# Patient Record
Sex: Male | Born: 1935 | Race: White | Hispanic: No | Smoking: Never smoker
Health system: Southern US, Community
[De-identification: ages and names within clinical notes are randomized; demographics above are authoritative.]

## PROBLEM LIST (undated history)

## (undated) DIAGNOSIS — T451X5A Adverse effect of antineoplastic and immunosuppressive drugs, initial encounter: Secondary | ICD-10-CM

## (undated) DIAGNOSIS — Z960 Presence of urogenital implants: Secondary | ICD-10-CM

## (undated) DIAGNOSIS — C439 Malignant melanoma of skin, unspecified: Secondary | ICD-10-CM

## (undated) DIAGNOSIS — Z85820 Personal history of malignant melanoma of skin: Secondary | ICD-10-CM

## (undated) DIAGNOSIS — M545 Low back pain, unspecified: Secondary | ICD-10-CM

## (undated) DIAGNOSIS — I2510 Atherosclerotic heart disease of native coronary artery without angina pectoris: Secondary | ICD-10-CM

## (undated) DIAGNOSIS — C20 Malignant neoplasm of rectum: Secondary | ICD-10-CM

## (undated) DIAGNOSIS — Z872 Personal history of diseases of the skin and subcutaneous tissue: Secondary | ICD-10-CM

## (undated) DIAGNOSIS — Z8679 Personal history of other diseases of the circulatory system: Secondary | ICD-10-CM

## (undated) DIAGNOSIS — I480 Paroxysmal atrial fibrillation: Secondary | ICD-10-CM

## (undated) DIAGNOSIS — N211 Calculus in urethra: Secondary | ICD-10-CM

## (undated) DIAGNOSIS — Z9889 Other specified postprocedural states: Secondary | ICD-10-CM

## (undated) DIAGNOSIS — Z978 Presence of other specified devices: Secondary | ICD-10-CM

## (undated) DIAGNOSIS — N529 Male erectile dysfunction, unspecified: Secondary | ICD-10-CM

## (undated) DIAGNOSIS — M1990 Unspecified osteoarthritis, unspecified site: Secondary | ICD-10-CM

## (undated) DIAGNOSIS — I4510 Unspecified right bundle-branch block: Secondary | ICD-10-CM

## (undated) DIAGNOSIS — C4491 Basal cell carcinoma of skin, unspecified: Secondary | ICD-10-CM

## (undated) DIAGNOSIS — N400 Enlarged prostate without lower urinary tract symptoms: Secondary | ICD-10-CM

## (undated) DIAGNOSIS — D6481 Anemia due to antineoplastic chemotherapy: Secondary | ICD-10-CM

## (undated) DIAGNOSIS — Z808 Family history of malignant neoplasm of other organs or systems: Secondary | ICD-10-CM

## (undated) DIAGNOSIS — I10 Essential (primary) hypertension: Secondary | ICD-10-CM

## (undated) DIAGNOSIS — Z85828 Personal history of other malignant neoplasm of skin: Secondary | ICD-10-CM

## (undated) DIAGNOSIS — R600 Localized edema: Secondary | ICD-10-CM

## (undated) DIAGNOSIS — M4800 Spinal stenosis, site unspecified: Secondary | ICD-10-CM

## (undated) DIAGNOSIS — Z86010 Personal history of colonic polyps: Secondary | ICD-10-CM

## (undated) DIAGNOSIS — Z8669 Personal history of other diseases of the nervous system and sense organs: Secondary | ICD-10-CM

## (undated) DIAGNOSIS — Z8719 Personal history of other diseases of the digestive system: Secondary | ICD-10-CM

## (undated) DIAGNOSIS — E039 Hypothyroidism, unspecified: Secondary | ICD-10-CM

## (undated) DIAGNOSIS — Z860101 Personal history of adenomatous and serrated colon polyps: Secondary | ICD-10-CM

## (undated) DIAGNOSIS — K5900 Constipation, unspecified: Secondary | ICD-10-CM

## (undated) DIAGNOSIS — Z923 Personal history of irradiation: Secondary | ICD-10-CM

## (undated) DIAGNOSIS — G8929 Other chronic pain: Secondary | ICD-10-CM

## (undated) DIAGNOSIS — N210 Calculus in bladder: Secondary | ICD-10-CM

## (undated) DIAGNOSIS — I444 Left anterior fascicular block: Secondary | ICD-10-CM

## (undated) DIAGNOSIS — E7800 Pure hypercholesterolemia, unspecified: Secondary | ICD-10-CM

## (undated) HISTORY — PX: ROTATOR CUFF REPAIR: SHX139

## (undated) HISTORY — DX: Family history of malignant neoplasm of other organs or systems: Z80.8

## (undated) HISTORY — PX: MOHS SURGERY: SUR867

## (undated) HISTORY — DX: Basal cell carcinoma of skin, unspecified: C44.91

---
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
## 1963-09-08 HISTORY — PX: INGUINAL HERNIA REPAIR: SUR1180

## 1998-12-16 ENCOUNTER — Ambulatory Visit (HOSPITAL_COMMUNITY): Admission: RE | Admit: 1998-12-16 | Discharge: 1998-12-16 | Payer: Self-pay | Admitting: Gastroenterology

## 2000-07-18 ENCOUNTER — Ambulatory Visit (HOSPITAL_BASED_OUTPATIENT_CLINIC_OR_DEPARTMENT_OTHER): Admission: RE | Admit: 2000-07-18 | Discharge: 2000-07-18 | Payer: Self-pay | Admitting: Family Medicine

## 2001-08-11 ENCOUNTER — Ambulatory Visit (HOSPITAL_BASED_OUTPATIENT_CLINIC_OR_DEPARTMENT_OTHER): Admission: RE | Admit: 2001-08-11 | Discharge: 2001-08-11 | Payer: Self-pay | Admitting: Plastic Surgery

## 2002-09-29 ENCOUNTER — Ambulatory Visit (HOSPITAL_COMMUNITY): Admission: RE | Admit: 2002-09-29 | Discharge: 2002-09-29 | Payer: Self-pay | Admitting: Gastroenterology

## 2005-10-07 ENCOUNTER — Ambulatory Visit (HOSPITAL_COMMUNITY): Admission: RE | Admit: 2005-10-07 | Discharge: 2005-10-08 | Payer: Self-pay | Admitting: Orthopedic Surgery

## 2011-09-09 DIAGNOSIS — Z79899 Other long term (current) drug therapy: Secondary | ICD-10-CM | POA: Diagnosis not present

## 2011-09-09 DIAGNOSIS — E7800 Pure hypercholesterolemia, unspecified: Secondary | ICD-10-CM | POA: Diagnosis not present

## 2011-10-08 DIAGNOSIS — E039 Hypothyroidism, unspecified: Secondary | ICD-10-CM | POA: Diagnosis not present

## 2011-10-08 DIAGNOSIS — E7800 Pure hypercholesterolemia, unspecified: Secondary | ICD-10-CM | POA: Diagnosis not present

## 2011-10-08 DIAGNOSIS — N200 Calculus of kidney: Secondary | ICD-10-CM | POA: Diagnosis not present

## 2011-10-08 DIAGNOSIS — I10 Essential (primary) hypertension: Secondary | ICD-10-CM | POA: Diagnosis not present

## 2011-10-08 DIAGNOSIS — Z0000 Encounter for general adult medical examination without abnormal findings: Secondary | ICD-10-CM | POA: Diagnosis not present

## 2011-10-08 DIAGNOSIS — Z125 Encounter for screening for malignant neoplasm of prostate: Secondary | ICD-10-CM | POA: Diagnosis not present

## 2011-10-08 DIAGNOSIS — R7301 Impaired fasting glucose: Secondary | ICD-10-CM | POA: Diagnosis not present

## 2011-10-16 DIAGNOSIS — M25519 Pain in unspecified shoulder: Secondary | ICD-10-CM | POA: Diagnosis not present

## 2011-12-07 DIAGNOSIS — R351 Nocturia: Secondary | ICD-10-CM | POA: Diagnosis not present

## 2012-02-18 DIAGNOSIS — R7301 Impaired fasting glucose: Secondary | ICD-10-CM | POA: Diagnosis not present

## 2012-02-18 DIAGNOSIS — Z0000 Encounter for general adult medical examination without abnormal findings: Secondary | ICD-10-CM | POA: Diagnosis not present

## 2012-02-18 DIAGNOSIS — E7800 Pure hypercholesterolemia, unspecified: Secondary | ICD-10-CM | POA: Diagnosis not present

## 2012-02-18 DIAGNOSIS — E039 Hypothyroidism, unspecified: Secondary | ICD-10-CM | POA: Diagnosis not present

## 2012-02-18 DIAGNOSIS — I10 Essential (primary) hypertension: Secondary | ICD-10-CM | POA: Diagnosis not present

## 2012-02-19 ENCOUNTER — Other Ambulatory Visit: Payer: Self-pay | Admitting: Orthopaedic Surgery

## 2012-02-19 DIAGNOSIS — M25519 Pain in unspecified shoulder: Secondary | ICD-10-CM | POA: Diagnosis not present

## 2012-02-19 DIAGNOSIS — M25511 Pain in right shoulder: Secondary | ICD-10-CM

## 2012-02-22 ENCOUNTER — Ambulatory Visit
Admission: RE | Admit: 2012-02-22 | Discharge: 2012-02-22 | Disposition: A | Discharge status: Home / Self Care | Payer: BLUE CROSS/BLUE SHIELD | Source: Ambulatory Visit | Attending: Orthopaedic Surgery | Admitting: Orthopaedic Surgery

## 2012-02-22 DIAGNOSIS — M25419 Effusion, unspecified shoulder: Secondary | ICD-10-CM | POA: Diagnosis not present

## 2012-02-22 DIAGNOSIS — M25511 Pain in right shoulder: Secondary | ICD-10-CM

## 2012-02-22 DIAGNOSIS — M25519 Pain in unspecified shoulder: Secondary | ICD-10-CM | POA: Diagnosis not present

## 2012-02-22 DIAGNOSIS — M19019 Primary osteoarthritis, unspecified shoulder: Secondary | ICD-10-CM | POA: Diagnosis not present

## 2012-02-26 DIAGNOSIS — S43429A Sprain of unspecified rotator cuff capsule, initial encounter: Secondary | ICD-10-CM | POA: Diagnosis not present

## 2012-04-11 ENCOUNTER — Other Ambulatory Visit: Payer: Self-pay | Admitting: Dermatology

## 2012-04-11 DIAGNOSIS — Z85828 Personal history of other malignant neoplasm of skin: Secondary | ICD-10-CM | POA: Diagnosis not present

## 2012-04-11 DIAGNOSIS — C4330 Malignant melanoma of unspecified part of face: Secondary | ICD-10-CM | POA: Diagnosis not present

## 2012-04-11 DIAGNOSIS — Z85820 Personal history of malignant melanoma of skin: Secondary | ICD-10-CM | POA: Diagnosis not present

## 2012-04-11 DIAGNOSIS — D485 Neoplasm of uncertain behavior of skin: Secondary | ICD-10-CM | POA: Diagnosis not present

## 2012-04-11 DIAGNOSIS — L723 Sebaceous cyst: Secondary | ICD-10-CM | POA: Diagnosis not present

## 2012-04-11 DIAGNOSIS — L821 Other seborrheic keratosis: Secondary | ICD-10-CM | POA: Diagnosis not present

## 2012-04-11 DIAGNOSIS — L570 Actinic keratosis: Secondary | ICD-10-CM | POA: Diagnosis not present

## 2012-04-28 DIAGNOSIS — M19019 Primary osteoarthritis, unspecified shoulder: Secondary | ICD-10-CM | POA: Diagnosis not present

## 2012-04-28 DIAGNOSIS — S43429A Sprain of unspecified rotator cuff capsule, initial encounter: Secondary | ICD-10-CM | POA: Diagnosis not present

## 2012-04-28 DIAGNOSIS — R5381 Other malaise: Secondary | ICD-10-CM | POA: Diagnosis not present

## 2012-04-28 DIAGNOSIS — M659 Synovitis and tenosynovitis, unspecified: Secondary | ICD-10-CM | POA: Diagnosis not present

## 2012-04-28 DIAGNOSIS — M66329 Spontaneous rupture of flexor tendons, unspecified upper arm: Secondary | ICD-10-CM | POA: Diagnosis not present

## 2012-04-28 DIAGNOSIS — M75110 Incomplete rotator cuff tear or rupture of unspecified shoulder, not specified as traumatic: Secondary | ICD-10-CM | POA: Diagnosis not present

## 2012-04-28 DIAGNOSIS — M249 Joint derangement, unspecified: Secondary | ICD-10-CM | POA: Diagnosis not present

## 2012-04-28 DIAGNOSIS — I10 Essential (primary) hypertension: Secondary | ICD-10-CM | POA: Diagnosis not present

## 2012-04-29 DIAGNOSIS — Z4789 Encounter for other orthopedic aftercare: Secondary | ICD-10-CM | POA: Diagnosis not present

## 2012-04-29 DIAGNOSIS — M6281 Muscle weakness (generalized): Secondary | ICD-10-CM | POA: Diagnosis not present

## 2012-04-29 DIAGNOSIS — R609 Edema, unspecified: Secondary | ICD-10-CM | POA: Diagnosis not present

## 2012-04-29 DIAGNOSIS — M25519 Pain in unspecified shoulder: Secondary | ICD-10-CM | POA: Diagnosis not present

## 2012-04-29 DIAGNOSIS — R279 Unspecified lack of coordination: Secondary | ICD-10-CM | POA: Diagnosis not present

## 2012-05-02 DIAGNOSIS — M6281 Muscle weakness (generalized): Secondary | ICD-10-CM | POA: Diagnosis not present

## 2012-05-02 DIAGNOSIS — R609 Edema, unspecified: Secondary | ICD-10-CM | POA: Diagnosis not present

## 2012-05-02 DIAGNOSIS — R279 Unspecified lack of coordination: Secondary | ICD-10-CM | POA: Diagnosis not present

## 2012-05-02 DIAGNOSIS — M25519 Pain in unspecified shoulder: Secondary | ICD-10-CM | POA: Diagnosis not present

## 2012-05-02 DIAGNOSIS — Z4789 Encounter for other orthopedic aftercare: Secondary | ICD-10-CM | POA: Diagnosis not present

## 2012-05-03 DIAGNOSIS — Z4789 Encounter for other orthopedic aftercare: Secondary | ICD-10-CM | POA: Diagnosis not present

## 2012-05-03 DIAGNOSIS — M6281 Muscle weakness (generalized): Secondary | ICD-10-CM | POA: Diagnosis not present

## 2012-05-03 DIAGNOSIS — M25519 Pain in unspecified shoulder: Secondary | ICD-10-CM | POA: Diagnosis not present

## 2012-05-03 DIAGNOSIS — R279 Unspecified lack of coordination: Secondary | ICD-10-CM | POA: Diagnosis not present

## 2012-05-03 DIAGNOSIS — R609 Edema, unspecified: Secondary | ICD-10-CM | POA: Diagnosis not present

## 2012-05-04 DIAGNOSIS — Z4789 Encounter for other orthopedic aftercare: Secondary | ICD-10-CM | POA: Diagnosis not present

## 2012-05-04 DIAGNOSIS — R609 Edema, unspecified: Secondary | ICD-10-CM | POA: Diagnosis not present

## 2012-05-04 DIAGNOSIS — M6281 Muscle weakness (generalized): Secondary | ICD-10-CM | POA: Diagnosis not present

## 2012-05-04 DIAGNOSIS — R279 Unspecified lack of coordination: Secondary | ICD-10-CM | POA: Diagnosis not present

## 2012-05-04 DIAGNOSIS — M25519 Pain in unspecified shoulder: Secondary | ICD-10-CM | POA: Diagnosis not present

## 2012-05-05 DIAGNOSIS — R609 Edema, unspecified: Secondary | ICD-10-CM | POA: Diagnosis not present

## 2012-05-05 DIAGNOSIS — M6281 Muscle weakness (generalized): Secondary | ICD-10-CM | POA: Diagnosis not present

## 2012-05-05 DIAGNOSIS — Z4789 Encounter for other orthopedic aftercare: Secondary | ICD-10-CM | POA: Diagnosis not present

## 2012-05-05 DIAGNOSIS — M25519 Pain in unspecified shoulder: Secondary | ICD-10-CM | POA: Diagnosis not present

## 2012-05-05 DIAGNOSIS — R279 Unspecified lack of coordination: Secondary | ICD-10-CM | POA: Diagnosis not present

## 2012-05-06 DIAGNOSIS — R279 Unspecified lack of coordination: Secondary | ICD-10-CM | POA: Diagnosis not present

## 2012-05-06 DIAGNOSIS — M6281 Muscle weakness (generalized): Secondary | ICD-10-CM | POA: Diagnosis not present

## 2012-05-06 DIAGNOSIS — Z4789 Encounter for other orthopedic aftercare: Secondary | ICD-10-CM | POA: Diagnosis not present

## 2012-05-06 DIAGNOSIS — R609 Edema, unspecified: Secondary | ICD-10-CM | POA: Diagnosis not present

## 2012-05-06 DIAGNOSIS — M25519 Pain in unspecified shoulder: Secondary | ICD-10-CM | POA: Diagnosis not present

## 2012-05-08 DIAGNOSIS — M6281 Muscle weakness (generalized): Secondary | ICD-10-CM | POA: Diagnosis not present

## 2012-05-08 DIAGNOSIS — R279 Unspecified lack of coordination: Secondary | ICD-10-CM | POA: Diagnosis not present

## 2012-05-08 DIAGNOSIS — R609 Edema, unspecified: Secondary | ICD-10-CM | POA: Diagnosis not present

## 2012-05-08 DIAGNOSIS — M25519 Pain in unspecified shoulder: Secondary | ICD-10-CM | POA: Diagnosis not present

## 2012-05-08 DIAGNOSIS — Z4789 Encounter for other orthopedic aftercare: Secondary | ICD-10-CM | POA: Diagnosis not present

## 2012-05-12 DIAGNOSIS — R5381 Other malaise: Secondary | ICD-10-CM | POA: Diagnosis not present

## 2012-05-12 DIAGNOSIS — I10 Essential (primary) hypertension: Secondary | ICD-10-CM | POA: Diagnosis not present

## 2012-05-12 DIAGNOSIS — M75110 Incomplete rotator cuff tear or rupture of unspecified shoulder, not specified as traumatic: Secondary | ICD-10-CM | POA: Diagnosis not present

## 2012-05-18 DIAGNOSIS — H2510 Age-related nuclear cataract, unspecified eye: Secondary | ICD-10-CM | POA: Diagnosis not present

## 2012-05-31 DIAGNOSIS — D485 Neoplasm of uncertain behavior of skin: Secondary | ICD-10-CM | POA: Diagnosis not present

## 2012-05-31 DIAGNOSIS — L905 Scar conditions and fibrosis of skin: Secondary | ICD-10-CM | POA: Diagnosis not present

## 2012-05-31 DIAGNOSIS — C434 Malignant melanoma of scalp and neck: Secondary | ICD-10-CM | POA: Diagnosis not present

## 2012-06-08 DIAGNOSIS — R609 Edema, unspecified: Secondary | ICD-10-CM | POA: Diagnosis not present

## 2012-06-08 DIAGNOSIS — M25519 Pain in unspecified shoulder: Secondary | ICD-10-CM | POA: Diagnosis not present

## 2012-06-08 DIAGNOSIS — M6281 Muscle weakness (generalized): Secondary | ICD-10-CM | POA: Diagnosis not present

## 2012-06-08 DIAGNOSIS — Z4789 Encounter for other orthopedic aftercare: Secondary | ICD-10-CM | POA: Diagnosis not present

## 2012-06-08 DIAGNOSIS — R279 Unspecified lack of coordination: Secondary | ICD-10-CM | POA: Diagnosis not present

## 2012-06-10 DIAGNOSIS — M6281 Muscle weakness (generalized): Secondary | ICD-10-CM | POA: Diagnosis not present

## 2012-06-10 DIAGNOSIS — Z4789 Encounter for other orthopedic aftercare: Secondary | ICD-10-CM | POA: Diagnosis not present

## 2012-06-10 DIAGNOSIS — R279 Unspecified lack of coordination: Secondary | ICD-10-CM | POA: Diagnosis not present

## 2012-06-10 DIAGNOSIS — R609 Edema, unspecified: Secondary | ICD-10-CM | POA: Diagnosis not present

## 2012-06-10 DIAGNOSIS — M25519 Pain in unspecified shoulder: Secondary | ICD-10-CM | POA: Diagnosis not present

## 2012-06-13 DIAGNOSIS — M25519 Pain in unspecified shoulder: Secondary | ICD-10-CM | POA: Diagnosis not present

## 2012-06-13 DIAGNOSIS — M6281 Muscle weakness (generalized): Secondary | ICD-10-CM | POA: Diagnosis not present

## 2012-06-13 DIAGNOSIS — Z4789 Encounter for other orthopedic aftercare: Secondary | ICD-10-CM | POA: Diagnosis not present

## 2012-06-13 DIAGNOSIS — R279 Unspecified lack of coordination: Secondary | ICD-10-CM | POA: Diagnosis not present

## 2012-06-13 DIAGNOSIS — R609 Edema, unspecified: Secondary | ICD-10-CM | POA: Diagnosis not present

## 2012-06-15 DIAGNOSIS — R279 Unspecified lack of coordination: Secondary | ICD-10-CM | POA: Diagnosis not present

## 2012-06-15 DIAGNOSIS — M6281 Muscle weakness (generalized): Secondary | ICD-10-CM | POA: Diagnosis not present

## 2012-06-15 DIAGNOSIS — M25519 Pain in unspecified shoulder: Secondary | ICD-10-CM | POA: Diagnosis not present

## 2012-06-15 DIAGNOSIS — R609 Edema, unspecified: Secondary | ICD-10-CM | POA: Diagnosis not present

## 2012-06-15 DIAGNOSIS — Z4789 Encounter for other orthopedic aftercare: Secondary | ICD-10-CM | POA: Diagnosis not present

## 2012-06-17 DIAGNOSIS — Z4789 Encounter for other orthopedic aftercare: Secondary | ICD-10-CM | POA: Diagnosis not present

## 2012-06-17 DIAGNOSIS — R279 Unspecified lack of coordination: Secondary | ICD-10-CM | POA: Diagnosis not present

## 2012-06-17 DIAGNOSIS — M6281 Muscle weakness (generalized): Secondary | ICD-10-CM | POA: Diagnosis not present

## 2012-06-17 DIAGNOSIS — R609 Edema, unspecified: Secondary | ICD-10-CM | POA: Diagnosis not present

## 2012-06-17 DIAGNOSIS — M25519 Pain in unspecified shoulder: Secondary | ICD-10-CM | POA: Diagnosis not present

## 2012-06-20 DIAGNOSIS — M25519 Pain in unspecified shoulder: Secondary | ICD-10-CM | POA: Diagnosis not present

## 2012-06-20 DIAGNOSIS — R609 Edema, unspecified: Secondary | ICD-10-CM | POA: Diagnosis not present

## 2012-06-20 DIAGNOSIS — M6281 Muscle weakness (generalized): Secondary | ICD-10-CM | POA: Diagnosis not present

## 2012-06-20 DIAGNOSIS — Z4789 Encounter for other orthopedic aftercare: Secondary | ICD-10-CM | POA: Diagnosis not present

## 2012-06-20 DIAGNOSIS — R279 Unspecified lack of coordination: Secondary | ICD-10-CM | POA: Diagnosis not present

## 2012-06-21 DIAGNOSIS — Z23 Encounter for immunization: Secondary | ICD-10-CM | POA: Diagnosis not present

## 2012-06-22 DIAGNOSIS — M25519 Pain in unspecified shoulder: Secondary | ICD-10-CM | POA: Diagnosis not present

## 2012-06-22 DIAGNOSIS — R279 Unspecified lack of coordination: Secondary | ICD-10-CM | POA: Diagnosis not present

## 2012-06-22 DIAGNOSIS — M6281 Muscle weakness (generalized): Secondary | ICD-10-CM | POA: Diagnosis not present

## 2012-06-22 DIAGNOSIS — R609 Edema, unspecified: Secondary | ICD-10-CM | POA: Diagnosis not present

## 2012-06-22 DIAGNOSIS — Z4789 Encounter for other orthopedic aftercare: Secondary | ICD-10-CM | POA: Diagnosis not present

## 2012-06-24 DIAGNOSIS — R609 Edema, unspecified: Secondary | ICD-10-CM | POA: Diagnosis not present

## 2012-06-24 DIAGNOSIS — M6281 Muscle weakness (generalized): Secondary | ICD-10-CM | POA: Diagnosis not present

## 2012-06-24 DIAGNOSIS — R279 Unspecified lack of coordination: Secondary | ICD-10-CM | POA: Diagnosis not present

## 2012-06-24 DIAGNOSIS — M25519 Pain in unspecified shoulder: Secondary | ICD-10-CM | POA: Diagnosis not present

## 2012-06-24 DIAGNOSIS — Z4789 Encounter for other orthopedic aftercare: Secondary | ICD-10-CM | POA: Diagnosis not present

## 2012-06-27 DIAGNOSIS — M25519 Pain in unspecified shoulder: Secondary | ICD-10-CM | POA: Diagnosis not present

## 2012-06-27 DIAGNOSIS — Z4789 Encounter for other orthopedic aftercare: Secondary | ICD-10-CM | POA: Diagnosis not present

## 2012-06-27 DIAGNOSIS — R279 Unspecified lack of coordination: Secondary | ICD-10-CM | POA: Diagnosis not present

## 2012-06-27 DIAGNOSIS — R609 Edema, unspecified: Secondary | ICD-10-CM | POA: Diagnosis not present

## 2012-06-27 DIAGNOSIS — M6281 Muscle weakness (generalized): Secondary | ICD-10-CM | POA: Diagnosis not present

## 2012-06-29 DIAGNOSIS — R609 Edema, unspecified: Secondary | ICD-10-CM | POA: Diagnosis not present

## 2012-06-29 DIAGNOSIS — R279 Unspecified lack of coordination: Secondary | ICD-10-CM | POA: Diagnosis not present

## 2012-06-29 DIAGNOSIS — M6281 Muscle weakness (generalized): Secondary | ICD-10-CM | POA: Diagnosis not present

## 2012-06-29 DIAGNOSIS — Z4789 Encounter for other orthopedic aftercare: Secondary | ICD-10-CM | POA: Diagnosis not present

## 2012-06-29 DIAGNOSIS — M25519 Pain in unspecified shoulder: Secondary | ICD-10-CM | POA: Diagnosis not present

## 2012-07-01 DIAGNOSIS — M25519 Pain in unspecified shoulder: Secondary | ICD-10-CM | POA: Diagnosis not present

## 2012-07-01 DIAGNOSIS — Z4789 Encounter for other orthopedic aftercare: Secondary | ICD-10-CM | POA: Diagnosis not present

## 2012-07-01 DIAGNOSIS — R609 Edema, unspecified: Secondary | ICD-10-CM | POA: Diagnosis not present

## 2012-07-01 DIAGNOSIS — R279 Unspecified lack of coordination: Secondary | ICD-10-CM | POA: Diagnosis not present

## 2012-07-01 DIAGNOSIS — M6281 Muscle weakness (generalized): Secondary | ICD-10-CM | POA: Diagnosis not present

## 2012-07-04 DIAGNOSIS — R609 Edema, unspecified: Secondary | ICD-10-CM | POA: Diagnosis not present

## 2012-07-04 DIAGNOSIS — R279 Unspecified lack of coordination: Secondary | ICD-10-CM | POA: Diagnosis not present

## 2012-07-04 DIAGNOSIS — M25519 Pain in unspecified shoulder: Secondary | ICD-10-CM | POA: Diagnosis not present

## 2012-07-04 DIAGNOSIS — M6281 Muscle weakness (generalized): Secondary | ICD-10-CM | POA: Diagnosis not present

## 2012-07-04 DIAGNOSIS — Z4789 Encounter for other orthopedic aftercare: Secondary | ICD-10-CM | POA: Diagnosis not present

## 2012-07-06 DIAGNOSIS — R279 Unspecified lack of coordination: Secondary | ICD-10-CM | POA: Diagnosis not present

## 2012-07-06 DIAGNOSIS — R609 Edema, unspecified: Secondary | ICD-10-CM | POA: Diagnosis not present

## 2012-07-06 DIAGNOSIS — M25519 Pain in unspecified shoulder: Secondary | ICD-10-CM | POA: Diagnosis not present

## 2012-07-06 DIAGNOSIS — Z4789 Encounter for other orthopedic aftercare: Secondary | ICD-10-CM | POA: Diagnosis not present

## 2012-07-06 DIAGNOSIS — M6281 Muscle weakness (generalized): Secondary | ICD-10-CM | POA: Diagnosis not present

## 2012-07-08 DIAGNOSIS — Z4789 Encounter for other orthopedic aftercare: Secondary | ICD-10-CM | POA: Diagnosis not present

## 2012-07-08 DIAGNOSIS — R279 Unspecified lack of coordination: Secondary | ICD-10-CM | POA: Diagnosis not present

## 2012-07-08 DIAGNOSIS — M25519 Pain in unspecified shoulder: Secondary | ICD-10-CM | POA: Diagnosis not present

## 2012-07-08 DIAGNOSIS — M6281 Muscle weakness (generalized): Secondary | ICD-10-CM | POA: Diagnosis not present

## 2012-07-08 DIAGNOSIS — R609 Edema, unspecified: Secondary | ICD-10-CM | POA: Diagnosis not present

## 2012-08-08 DIAGNOSIS — Z4789 Encounter for other orthopedic aftercare: Secondary | ICD-10-CM | POA: Diagnosis not present

## 2012-08-08 DIAGNOSIS — R279 Unspecified lack of coordination: Secondary | ICD-10-CM | POA: Diagnosis not present

## 2012-08-08 DIAGNOSIS — L570 Actinic keratosis: Secondary | ICD-10-CM | POA: Diagnosis not present

## 2012-08-08 DIAGNOSIS — R609 Edema, unspecified: Secondary | ICD-10-CM | POA: Diagnosis not present

## 2012-08-08 DIAGNOSIS — M6281 Muscle weakness (generalized): Secondary | ICD-10-CM | POA: Diagnosis not present

## 2012-08-08 DIAGNOSIS — M25519 Pain in unspecified shoulder: Secondary | ICD-10-CM | POA: Diagnosis not present

## 2012-08-10 DIAGNOSIS — R279 Unspecified lack of coordination: Secondary | ICD-10-CM | POA: Diagnosis not present

## 2012-08-10 DIAGNOSIS — M6281 Muscle weakness (generalized): Secondary | ICD-10-CM | POA: Diagnosis not present

## 2012-08-10 DIAGNOSIS — M25519 Pain in unspecified shoulder: Secondary | ICD-10-CM | POA: Diagnosis not present

## 2012-08-10 DIAGNOSIS — Z4789 Encounter for other orthopedic aftercare: Secondary | ICD-10-CM | POA: Diagnosis not present

## 2012-08-10 DIAGNOSIS — R609 Edema, unspecified: Secondary | ICD-10-CM | POA: Diagnosis not present

## 2012-08-12 DIAGNOSIS — M6281 Muscle weakness (generalized): Secondary | ICD-10-CM | POA: Diagnosis not present

## 2012-08-12 DIAGNOSIS — R279 Unspecified lack of coordination: Secondary | ICD-10-CM | POA: Diagnosis not present

## 2012-08-12 DIAGNOSIS — Z4789 Encounter for other orthopedic aftercare: Secondary | ICD-10-CM | POA: Diagnosis not present

## 2012-08-12 DIAGNOSIS — M25519 Pain in unspecified shoulder: Secondary | ICD-10-CM | POA: Diagnosis not present

## 2012-08-12 DIAGNOSIS — R609 Edema, unspecified: Secondary | ICD-10-CM | POA: Diagnosis not present

## 2012-08-17 DIAGNOSIS — R609 Edema, unspecified: Secondary | ICD-10-CM | POA: Diagnosis not present

## 2012-08-17 DIAGNOSIS — M25519 Pain in unspecified shoulder: Secondary | ICD-10-CM | POA: Diagnosis not present

## 2012-08-17 DIAGNOSIS — Z4789 Encounter for other orthopedic aftercare: Secondary | ICD-10-CM | POA: Diagnosis not present

## 2012-08-17 DIAGNOSIS — R279 Unspecified lack of coordination: Secondary | ICD-10-CM | POA: Diagnosis not present

## 2012-08-17 DIAGNOSIS — M6281 Muscle weakness (generalized): Secondary | ICD-10-CM | POA: Diagnosis not present

## 2012-08-18 DIAGNOSIS — R7301 Impaired fasting glucose: Secondary | ICD-10-CM | POA: Diagnosis not present

## 2012-08-18 DIAGNOSIS — Z0000 Encounter for general adult medical examination without abnormal findings: Secondary | ICD-10-CM | POA: Diagnosis not present

## 2012-08-18 DIAGNOSIS — G459 Transient cerebral ischemic attack, unspecified: Secondary | ICD-10-CM | POA: Diagnosis not present

## 2012-08-18 DIAGNOSIS — E039 Hypothyroidism, unspecified: Secondary | ICD-10-CM | POA: Diagnosis not present

## 2012-08-18 DIAGNOSIS — Z86010 Personal history of colonic polyps: Secondary | ICD-10-CM | POA: Diagnosis not present

## 2012-08-18 DIAGNOSIS — E7800 Pure hypercholesterolemia, unspecified: Secondary | ICD-10-CM | POA: Diagnosis not present

## 2012-08-18 DIAGNOSIS — N200 Calculus of kidney: Secondary | ICD-10-CM | POA: Diagnosis not present

## 2012-08-18 DIAGNOSIS — C439 Malignant melanoma of skin, unspecified: Secondary | ICD-10-CM | POA: Diagnosis not present

## 2012-08-19 DIAGNOSIS — M25519 Pain in unspecified shoulder: Secondary | ICD-10-CM | POA: Diagnosis not present

## 2012-08-19 DIAGNOSIS — R279 Unspecified lack of coordination: Secondary | ICD-10-CM | POA: Diagnosis not present

## 2012-08-19 DIAGNOSIS — R609 Edema, unspecified: Secondary | ICD-10-CM | POA: Diagnosis not present

## 2012-08-19 DIAGNOSIS — Z4789 Encounter for other orthopedic aftercare: Secondary | ICD-10-CM | POA: Diagnosis not present

## 2012-08-19 DIAGNOSIS — M6281 Muscle weakness (generalized): Secondary | ICD-10-CM | POA: Diagnosis not present

## 2012-11-23 DIAGNOSIS — I10 Essential (primary) hypertension: Secondary | ICD-10-CM | POA: Diagnosis not present

## 2012-11-23 DIAGNOSIS — G459 Transient cerebral ischemic attack, unspecified: Secondary | ICD-10-CM | POA: Diagnosis not present

## 2012-11-23 DIAGNOSIS — Z1331 Encounter for screening for depression: Secondary | ICD-10-CM | POA: Diagnosis not present

## 2012-11-23 DIAGNOSIS — E039 Hypothyroidism, unspecified: Secondary | ICD-10-CM | POA: Diagnosis not present

## 2012-11-23 DIAGNOSIS — E785 Hyperlipidemia, unspecified: Secondary | ICD-10-CM | POA: Diagnosis not present

## 2012-11-23 DIAGNOSIS — Z23 Encounter for immunization: Secondary | ICD-10-CM | POA: Diagnosis not present

## 2012-11-23 DIAGNOSIS — R7301 Impaired fasting glucose: Secondary | ICD-10-CM | POA: Diagnosis not present

## 2012-12-12 DIAGNOSIS — N400 Enlarged prostate without lower urinary tract symptoms: Secondary | ICD-10-CM | POA: Diagnosis not present

## 2012-12-28 DIAGNOSIS — M25519 Pain in unspecified shoulder: Secondary | ICD-10-CM | POA: Diagnosis not present

## 2013-02-02 DIAGNOSIS — M1710 Unilateral primary osteoarthritis, unspecified knee: Secondary | ICD-10-CM | POA: Diagnosis not present

## 2013-02-02 DIAGNOSIS — M19049 Primary osteoarthritis, unspecified hand: Secondary | ICD-10-CM | POA: Diagnosis not present

## 2013-02-02 DIAGNOSIS — M25519 Pain in unspecified shoulder: Secondary | ICD-10-CM | POA: Diagnosis not present

## 2013-02-02 DIAGNOSIS — M5137 Other intervertebral disc degeneration, lumbosacral region: Secondary | ICD-10-CM | POA: Diagnosis not present

## 2013-02-15 DIAGNOSIS — Z79899 Other long term (current) drug therapy: Secondary | ICD-10-CM | POA: Diagnosis not present

## 2013-02-15 DIAGNOSIS — G459 Transient cerebral ischemic attack, unspecified: Secondary | ICD-10-CM | POA: Diagnosis not present

## 2013-02-15 DIAGNOSIS — E785 Hyperlipidemia, unspecified: Secondary | ICD-10-CM | POA: Diagnosis not present

## 2013-02-15 DIAGNOSIS — M2550 Pain in unspecified joint: Secondary | ICD-10-CM | POA: Diagnosis not present

## 2013-02-15 DIAGNOSIS — R7301 Impaired fasting glucose: Secondary | ICD-10-CM | POA: Diagnosis not present

## 2013-02-15 DIAGNOSIS — R5381 Other malaise: Secondary | ICD-10-CM | POA: Diagnosis not present

## 2013-02-15 DIAGNOSIS — E039 Hypothyroidism, unspecified: Secondary | ICD-10-CM | POA: Diagnosis not present

## 2013-02-15 DIAGNOSIS — I10 Essential (primary) hypertension: Secondary | ICD-10-CM | POA: Diagnosis not present

## 2013-02-18 DIAGNOSIS — M47817 Spondylosis without myelopathy or radiculopathy, lumbosacral region: Secondary | ICD-10-CM | POA: Diagnosis not present

## 2013-03-06 DIAGNOSIS — M19049 Primary osteoarthritis, unspecified hand: Secondary | ICD-10-CM | POA: Diagnosis not present

## 2013-03-06 DIAGNOSIS — M5137 Other intervertebral disc degeneration, lumbosacral region: Secondary | ICD-10-CM | POA: Diagnosis not present

## 2013-03-06 DIAGNOSIS — M25519 Pain in unspecified shoulder: Secondary | ICD-10-CM | POA: Diagnosis not present

## 2013-03-06 DIAGNOSIS — M1710 Unilateral primary osteoarthritis, unspecified knee: Secondary | ICD-10-CM | POA: Diagnosis not present

## 2013-04-25 DIAGNOSIS — M4310 Spondylolisthesis, site unspecified: Secondary | ICD-10-CM | POA: Diagnosis not present

## 2013-04-25 DIAGNOSIS — M545 Low back pain, unspecified: Secondary | ICD-10-CM | POA: Diagnosis not present

## 2013-04-25 DIAGNOSIS — M48061 Spinal stenosis, lumbar region without neurogenic claudication: Secondary | ICD-10-CM | POA: Diagnosis not present

## 2013-04-25 DIAGNOSIS — M546 Pain in thoracic spine: Secondary | ICD-10-CM | POA: Diagnosis not present

## 2013-05-01 DIAGNOSIS — M75110 Incomplete rotator cuff tear or rupture of unspecified shoulder, not specified as traumatic: Secondary | ICD-10-CM | POA: Diagnosis not present

## 2013-05-01 DIAGNOSIS — M25519 Pain in unspecified shoulder: Secondary | ICD-10-CM | POA: Diagnosis not present

## 2013-05-01 DIAGNOSIS — M25569 Pain in unspecified knee: Secondary | ICD-10-CM | POA: Diagnosis not present

## 2013-05-01 DIAGNOSIS — M1710 Unilateral primary osteoarthritis, unspecified knee: Secondary | ICD-10-CM | POA: Diagnosis not present

## 2013-05-10 ENCOUNTER — Inpatient Hospital Stay (HOSPITAL_COMMUNITY)
Admission: EM | Admit: 2013-05-10 | Discharge: 2013-05-14 | DRG: 309 | Disposition: A | Discharge status: Home / Self Care | Payer: Medicare Other | Attending: Internal Medicine | Admitting: Internal Medicine

## 2013-05-10 ENCOUNTER — Other Ambulatory Visit: Payer: Self-pay

## 2013-05-10 DIAGNOSIS — Z7982 Long term (current) use of aspirin: Secondary | ICD-10-CM | POA: Diagnosis not present

## 2013-05-10 DIAGNOSIS — I4891 Unspecified atrial fibrillation: Secondary | ICD-10-CM | POA: Diagnosis not present

## 2013-05-10 DIAGNOSIS — J9819 Other pulmonary collapse: Secondary | ICD-10-CM | POA: Diagnosis not present

## 2013-05-10 DIAGNOSIS — I309 Acute pericarditis, unspecified: Secondary | ICD-10-CM | POA: Diagnosis not present

## 2013-05-10 DIAGNOSIS — R079 Chest pain, unspecified: Secondary | ICD-10-CM | POA: Diagnosis present

## 2013-05-10 DIAGNOSIS — E7800 Pure hypercholesterolemia, unspecified: Secondary | ICD-10-CM | POA: Diagnosis not present

## 2013-05-10 DIAGNOSIS — I319 Disease of pericardium, unspecified: Secondary | ICD-10-CM | POA: Diagnosis not present

## 2013-05-10 DIAGNOSIS — Z66 Do not resuscitate: Secondary | ICD-10-CM | POA: Diagnosis not present

## 2013-05-10 DIAGNOSIS — I498 Other specified cardiac arrhythmias: Secondary | ICD-10-CM | POA: Diagnosis present

## 2013-05-10 DIAGNOSIS — I10 Essential (primary) hypertension: Secondary | ICD-10-CM | POA: Diagnosis not present

## 2013-05-10 DIAGNOSIS — I517 Cardiomegaly: Secondary | ICD-10-CM | POA: Diagnosis not present

## 2013-05-10 HISTORY — DX: Hypothyroidism, unspecified: E03.9

## 2013-05-10 HISTORY — DX: Pure hypercholesterolemia, unspecified: E78.00

## 2013-05-10 HISTORY — DX: Essential (primary) hypertension: I10

## 2013-05-10 NOTE — ED Notes (Signed)
Presents with chest tightness at the nipple line that began at 5 pm this evening, started at a 1/10 and increased to 5/10 described as tightness and pulling associated with SOB denies nausea and diaphoresis. Given 2 nitros with relief of pain, pain rated 1/10. pvc's noted on monitor, bilateral breath sounds clear. Pt alert and oriented.  DNR at bedside.

## 2013-05-11 ENCOUNTER — Encounter (HOSPITAL_COMMUNITY): Payer: Self-pay | Admitting: Adult Health

## 2013-05-11 ENCOUNTER — Emergency Department (HOSPITAL_COMMUNITY): Payer: Medicare Other

## 2013-05-11 DIAGNOSIS — I517 Cardiomegaly: Secondary | ICD-10-CM

## 2013-05-11 DIAGNOSIS — I10 Essential (primary) hypertension: Secondary | ICD-10-CM | POA: Diagnosis present

## 2013-05-11 DIAGNOSIS — R079 Chest pain, unspecified: Secondary | ICD-10-CM | POA: Diagnosis present

## 2013-05-11 LAB — BASIC METABOLIC PANEL
CO2: 29 mEq/L (ref 19–32)
Calcium: 8.8 mg/dL (ref 8.4–10.5)
Chloride: 103 mEq/L (ref 96–112)
GFR calc Af Amer: 85 mL/min — ABNORMAL LOW (ref >90)
Potassium: 4.2 mEq/L (ref 3.5–5.1)

## 2013-05-11 LAB — CBC
MCH: 32.6 pg (ref 26.0–34.0)
MCHC: 34.3 g/dL (ref 30.0–36.0)
Platelets: 186 10*3/uL (ref 150–400)
RDW: 13.7 % (ref 11.5–15.5)

## 2013-05-11 LAB — TROPONIN I: Troponin I: <0.3 ng/mL (ref <0.30)

## 2013-05-11 LAB — POCT I-STAT TROPONIN I: Troponin i, poc: 0.02 ng/mL (ref 0.00–0.08)

## 2013-05-11 MED ORDER — LEVOTHYROXINE SODIUM 88 MCG PO TABS
88.0000 ug | ORAL_TABLET | Freq: Every day | ORAL | Status: DC
  Administered 2013-05-11 – 2013-05-14 (×4): 88 ug via ORAL
  Filled 2013-05-11 (×7): qty 1

## 2013-05-11 MED ORDER — TAB-A-VITE/IRON PO TABS
1.0000 | ORAL_TABLET | Freq: Every day | ORAL | Status: DC
  Administered 2013-05-11 – 2013-05-14 (×4): 1 via ORAL
  Filled 2013-05-11 (×4): qty 1

## 2013-05-11 MED ORDER — NITROGLYCERIN 2 % TD OINT
0.5000 [in_us] | TOPICAL_OINTMENT | Freq: Once | TRANSDERMAL | Status: AC
  Administered 2013-05-11: 01:00:00 via TOPICAL
  Filled 2013-05-11: qty 1

## 2013-05-11 MED ORDER — ONDANSETRON HCL 4 MG/2ML IJ SOLN
4.0000 mg | Freq: Four times a day (QID) | INTRAMUSCULAR | Status: DC | PRN
Start: 2013-05-11 — End: 2013-05-14
  Administered 2013-05-11: 4 mg via INTRAVENOUS
  Filled 2013-05-11: qty 2

## 2013-05-11 MED ORDER — HYDROCHLOROTHIAZIDE 25 MG PO TABS
12.5000 mg | ORAL_TABLET | Freq: Every day | ORAL | Status: DC
  Filled 2013-05-11: qty 0.5

## 2013-05-11 MED ORDER — IBUPROFEN 600 MG PO TABS
600.0000 mg | ORAL_TABLET | Freq: Four times a day (QID) | ORAL | Status: DC | PRN
  Filled 2013-05-11: qty 1

## 2013-05-11 MED ORDER — HEPARIN SODIUM (PORCINE) 5000 UNIT/ML IJ SOLN: 5000.0000 [IU] | Freq: Three times a day (TID) | INTRAMUSCULAR | Status: DC

## 2013-05-11 MED ORDER — HYDROCHLOROTHIAZIDE 12.5 MG PO CAPS
12.5000 mg | ORAL_CAPSULE | Freq: Every day | ORAL | Status: DC
Start: 2013-05-11 — End: 2013-05-14
  Administered 2013-05-11 – 2013-05-14 (×4): 12.5 mg via ORAL
  Filled 2013-05-11 (×4): qty 1

## 2013-05-11 MED ORDER — FINASTERIDE 5 MG PO TABS
5.0000 mg | ORAL_TABLET | ORAL | Status: DC
  Administered 2013-05-11 – 2013-05-13 (×2): 5 mg via ORAL
  Filled 2013-05-11 (×2): qty 1

## 2013-05-11 MED ORDER — ASPIRIN 325 MG PO TABS
325.0000 mg | ORAL_TABLET | Freq: Every day | ORAL | Status: DC
  Administered 2013-05-11 – 2013-05-13 (×3): 325 mg via ORAL
  Filled 2013-05-11 (×3): qty 1

## 2013-05-11 MED ORDER — ACETAMINOPHEN 325 MG PO TABS
650.0000 mg | ORAL_TABLET | ORAL | Status: DC | PRN
  Administered 2013-05-11 (×2): 650 mg via ORAL
  Filled 2013-05-11 (×2): qty 2

## 2013-05-11 MED ORDER — NITROGLYCERIN 0.4 MG SL SUBL
0.4000 mg | SUBLINGUAL_TABLET | SUBLINGUAL | Status: DC | PRN
  Administered 2013-05-11 – 2013-05-12 (×4): 0.4 mg via SUBLINGUAL
  Filled 2013-05-11 (×3): qty 25

## 2013-05-11 NOTE — Progress Notes (Addendum)
Pt reports sternal CP 5/10 with no radiation. Pt denies SOB, N/V, or diaphoresis. EKG obtained and in EPIC.  BP 168/73. 1 SL NTG given. After 5 mins, pt reports CP 3/10. 1 SL NTG given. After 5 mins, pt reports relief of CP. BP 101/48. MD notified. No new orders received. Will continue to monitor pt closely.

## 2013-05-11 NOTE — H&P (Signed)
[facility] History and Physical  Nathaniel [name] WUJ:[id number] DOB: 18-Jun-1920 DOA: 05/10/2013  Referring physician: ED PCP: No primary provider on file.  Chief Complaint: Chest pain  HPI: Nathaniel [name] is a 79 y.o. male who presents to the ED with chest pain.  Pain is dull in quality, located across his anterior chest, initially had an episode at 5pm while walking today for which he took 2 ASA which helped.  The pain returned at 10pm while he was sitting down watching TV, pain got progressively worse so his ALF nurse saw him and called EMS.  2 NTG resolved his chest pain completely (and gave him a headache), and he was taken to the ED.  In the ED, EKG shows diffuse ST segment elevation, no reciprocal changes, remainder of work up negative, patient being admitted as CP r/o cardiac.  Review of Systems: 12 systems reviewed and otherwise negative.  Past Medical History  Diagnosis Date  . Thyroid disease   . Hypercholesteremia   . HTN (hypertension)    History reviewed. No pertinent past surgical history. Social History:  reports that he has never smoked. He does not have any smokeless tobacco history on file. He reports that  drinks alcohol. He reports that he does not use illicit drugs.   Allergies  Allergen Reactions  . Cinnamon Itching  . Percocet [Oxycodone-Acetaminophen] Other (See Comments)    confusion  . Protonix [Pantoprazole Sodium] Diarrhea and Nausea And Vomiting    History reviewed. No pertinent family history.   Prior to Admission medications   Medication Sig Start Date End Date Taking? Authorizing Provider  aspirin 325 MG tablet Take 325 mg by mouth daily.   Yes Historical Provider, MD  finasteride (PROSCAR) 5 MG tablet Take 5 mg by mouth every other day. Dose is taken at bedtime   Yes Historical Provider, MD  hydrochlorothiazide (HYDRODIURIL) 12.5 MG tablet Take 12.5 mg by mouth daily.  02/28/13  Yes Historical Provider, MD  levothyroxine (SYNTHROID,  LEVOTHROID) 88 MCG tablet Take 88 mcg by mouth daily before breakfast.  05/04/13  Yes Historical Provider, MD  Multiple Vitamins-Iron (MULTIVITAMINS WITH IRON) TABS tablet Take 1 tablet by mouth daily.   Yes Historical Provider, MD   Physical Exam: Filed Vitals:   05/11/13 0000  BP: 113/58  Pulse: 69  Temp:   Resp: 21    General:  NAD, resting comfortably in bed Eyes: PEERLA EOMI ENT: mucous membranes moist Neck: supple w/o JVD Cardiovascular: RRR w/o MRG Respiratory: CTA B Abdomen: soft, nt, nd, bs+ Skin: no rash nor lesion Musculoskeletal: MAE, full ROM all 4 extremities Psychiatric: normal tone and affect Neurologic: AAOx3, grossly non-focal  Labs on Admission:  Basic Metabolic Panel:  Recent Labs Lab 05/11/13 0030  NA 140  K 4.2  CL 103  CO2 29  GLUCOSE 128*  BUN 20  CREATININE 0.87  CALCIUM 8.8   Liver Function Tests: No results found for this basename: AST, ALT, ALKPHOS, BILITOT, PROT, ALBUMIN,  in the last 168 hours No results found for this basename: LIPASE, AMYLASE,  in the last 168 hours No results found for this basename: AMMONIA,  in the last 168 hours CBC:  Recent Labs Lab 05/11/13 0030  WBC 8.3  HGB 13.6  HCT 39.7  MCV 95.2  PLT 186   Cardiac Enzymes: No results found for this basename: CKTOTAL, CKMB, CKMBINDEX, TROPONINI,  in the last 168 hours  BNP (last 3 results) No results found for this basename: PROBNP,  in the last 8760 hours CBG: No results found for this basename: GLUCAP,  in the last 168 hours  Radiological Exams on Admission: Dg Chest 2 View  05/11/2013   *RADIOLOGY REPORT*  Clinical Data: Chest pain, shortness of breath  CHEST - 2 VIEW  Comparison: Prior radiograph from 10/05/2005.  Findings: Cardiac and mediastinal silhouettes are stable in size and contour, and remain within normal limits.  The lungs are normally inflated.  Mild linear opacities at the left lung base most likely reflect atelectasis or scarring.  No pulmonary  edema or pleural effusion is identified.  Curvilinear opacity within the left perihilar region is also most consistent with atelectasis.  No focal infiltrate identified.  No pneumothorax.  No acute osseous abnormality identified.  IMPRESSION: Left basilar and left perihilar atelectasis.  No acute cardiopulmonary process.   Original Report Authenticated By: Rise Mu, M.D.    EKG: Independently reviewed.  Assessment/Plan Principal Problem:   Chest pain Active Problems:   HTN (hypertension)   1. Chest pain - HEART score 5 to 6, the resolution of symptoms immediately following NTG is worrisome, patient has no history of CAD in the past and was an endurance runner in his younger years, despite his age of 60 he functionally appears to be more consistent with a 39 year old (ambulates unassisted with cane although he has a bad knee).  Likely needs cards consult re stress test, will use SCDs for prophylaxis on the off chance that this is endocarditis, but with the resolution of his CP with NTG this seems less likely, already plan on putting patient on NSAIDs for headache. 2. HTN - continue home meds.    Code Status: Full Code (must indicate code status--if unknown or must be presumed, indicate so) Family Communication: No family in room (indicate person spoken with, if applicable, with phone number if by telephone) Disposition Plan: Admit to obs(indicate anticipated LOS)  Time spent: 50 min  GARDNER, JARED M. [facility] Pager [phone]  If 7PM-7AM, please contact night-coverage [email or web address] Password [facility] 05/11/2013, 3:48 AM

## 2013-05-11 NOTE — Care Management Note (Unsigned)
    Page 1 of 1   05/11/2013     4:48:09 PM   CARE MANAGEMENT NOTE 05/11/2013  Patient:  Nathaniel [name], Nathaniel [name]   Account Number:  [id number]  Date Initiated:  05/11/2013  Documentation initiated by:  Avree Szczygiel  Subjective/Objective Assessment:   PT ADM ON 05/10/13 WITH CHEST PAIN.  PTA, PT RESIDES AT [facility].     Action/Plan:   WILL FOLLOW FOR HOME NEEDS AS PT PROGRESSES.   Anticipated DC Date:  05/12/2013   Anticipated DC Plan:  HOME/SELF CARE      DC Planning Services  CM consult      Choice offered to / List presented to:             Status of service:  In process, will continue to follow Medicare Important Message given?   (If response is "NO", the following Medicare IM given date fields will be blank) Date Medicare IM given:   Date Additional Medicare IM given:    Discharge Disposition:    Per UR Regulation:  Reviewed for med. necessity/level of care/duration of stay  If discussed at Long Length of Stay Meetings, dates discussed:    Comments:

## 2013-05-11 NOTE — Consult Note (Addendum)
Consulting cardiologist: Antoine Poche MD  Clinical Summary Nathaniel [name] is a 79 yo male with a history of HL, hypothyroidism, and HTN (per chart, pt denies hx of HTN) admitted w/ chest pain. He is independent and highly functional at his age, he is DNR however would consider medical interventions. He developed an episode of chest pain that started yesterday around 5pm while he was sitting down. Described as a dull aching pain across his entire chest, 1/10 w/ associated headached but no other symptoms. Specifically no nausea, vominting, SOB, palpitations, or diaphoresis. The pain was constant for approx 5 hours, nothing made the pain better or worst. Around 10pm the pain intesified to 4/10, and thus he informed the nurse at his independent living facility who called 911. EMS came and gave SL NG, patient reports improvement in symptoms. He was brought to ER and admitted for observation. He reports to me that since this morning he has had constant dull 1/10 pain in midchest for several hours, with no other associated symptoms. First trop negative, EKG shows incomplete RBBB, LAFB, w/ no clear ischemic changes.    Allergies  Allergen Reactions  . Cinnamon Itching  . Percocet [Oxycodone-Acetaminophen] Other (See Comments)    confusion  . Protonix [Pantoprazole Sodium] Diarrhea and Nausea And Vomiting    Medications Scheduled Medications: . aspirin  325 mg Oral Daily  . finasteride  5 mg Oral QODAY  . hydrochlorothiazide  12.5 mg Oral Daily  . levothyroxine  88 mcg Oral QAC breakfast  . multivitamins with iron  1 tablet Oral Daily     PRN Medications:  acetaminophen, ibuprofen, nitroGLYCERIN, ondansetron (ZOFRAN) IV   Past Medical History  Diagnosis Date  . Thyroid disease   . Hypercholesteremia   . HTN (hypertension)   . Hypothyroidism     Past Surgical History  Procedure Laterality Date  . Shoulder surgery Right 2013    rotator cuff   . Knee surgery Left 73 years ago    . Ankle fracture surgery Left 79 years ago  . Hernia repair  28 years ago    History reviewed. No pertinent family history.  Social History Mr. Trapp reports that he has never smoked. He does not have any smokeless tobacco history on file. Mr. Quinley reports that he drinks about 0.6 ounces of alcohol per week.  Review of Systems Negative other than stated in HPI.  Physical Examination Blood pressure 133/57, pulse 62, temperature 98 F (36.7 C), temperature source Oral, resp. rate 16, height 5\' 11"  (1.803 m), weight 161 lb 6 oz (73.2 kg), SpO2 93.00%.  Intake/Output Summary (Last 24 hours) at 05/11/13 1345 Last data filed at 05/11/13 0700  Gross per 24 hour  Intake      0 ml  Output      0 ml  Net      0 ml  Gen: NAD HEENT: no scleral icterus, pupils are equal round and reactive CV: RRR, no m/r/g, no JVD, no carotid bruits Pulm: CTAB Abd: soft, NT, ND Ext: warm, no edema Neuro: A&Ox3, no deficits.  Skin: no rash    Lab Results  Basic Metabolic Panel:  Recent Labs Lab 05/11/13 0030  NA 140  K 4.2  CL 103  CO2 29  GLUCOSE 128*  BUN 20  CREATININE 0.87  CALCIUM 8.8    Liver Function Tests: No results found for this basename: AST, ALT, ALKPHOS, BILITOT, PROT, ALBUMIN,  in the last 168 hours  CBC:  Recent Labs  Lab 05/11/13 0030  WBC 8.3  HGB 13.6  HCT 39.7  MCV 95.2  PLT 186    Cardiac Enzymes:  Recent Labs Lab 05/11/13 1210  TROPONINI <0.30    BNP: No components found with this basename: POCBNP,    ECG Sinus rhythm, incomplete RBBB, LAFB, no clear ischemic changes. I do not appreciate the diffuse ST segment elevations described, there appears to be some inconsistent changes due to a wandering EKG baseline.   Imaging CXR: left lung base atelectasis Echo: pending   Assessment/Plan 1. Chest pain: atypical chest pain in the fact that discomfort lasted nearly 5 hours at home, w/ no other associated symptoms. If ischemic pain for 5  hrs, would anticipate a positive initial troponin in the hospital. Symptoms did get better w/ NG, which may suggest but is not specific for angina. Current EKG is not specific for pericarditis either, as the intermittent elevations described appear more associated with the wandering the EKG baseline.  - continue observation overnight, f/u repeat EKG and echocardiogram. Continue to cycle cardiac enzymes - pending results, consider discharge vs. stress test in AM. Please keep NPO tonight in case of testing tomorrow.  - pt is DNR, but states if it came to it he would consider invasive diagnostic/therapeutic procedures - change home ASA from 325mg  to 81mg  at discharge, consider H2 blocker (pt w/ listed allergy to protonix).   2. HL: please check lipid panel. Pt previously on atorvastatin, but stopped after lingering muscle weakness in his shoulder following a surgery  3. HTN: per notes, pt denies, states on HCTZ for urinary issues. BP at goal, continue current meds.        Antoine Poche, M.D., F.A.C.C.

## 2013-05-11 NOTE — Progress Notes (Signed)
Pt complains of Nausea. 4mg  IV Zofran given. Will continue to monitor pt closely.

## 2013-05-11 NOTE — Progress Notes (Signed)
[facility] PROGRESS NOTE  JERSEY RAVENSCROFT ZOX:[id number] DOB: 1920-07-26 DOA: 05/10/2013 PCP: No primary provider on file.  Assessment/Plan: 1. Chest pain  1. Recurrent CP noted overnight, improved (within ) with NTG 2. Still complains of persistent 1/10 pain (was 4/10 on admit). 3. Denies sob, diaphoresis, or n/v 4. Given risk factors, will consult Cardiology for recs  2. HTN - continue home meds.  Code Status: DNR Family Communication: Pt in room (indicate person spoken with, relationship, and if by phone, the number) Disposition Plan: Pending   Consultants:  Cardiology  HPI/Subjective: Noted to have a 2/10 substernal chest pain overnight, improved with NTG.  Objective: Filed Vitals:   05/11/13 0000 05/11/13 0349 05/11/13 0433 05/11/13 1015  BP: 113/58 117/60 123/61 108/59  Pulse: 69 61 61 67  Temp:  97.9 F (36.6 C) 98.5 F (36.9 C)   TempSrc:  Oral Oral   Resp: 21 20 18    Height:   5\' 11"  (1.803 m)   Weight:   73.2 kg (161 lb 6 oz)   SpO2: 95% 93% 96%     Intake/Output Summary (Last 24 hours) at 05/11/13 1131 Last data filed at 05/11/13 0700  Gross per 24 hour  Intake      0 ml  Output      0 ml  Net      0 ml   Filed Weights   05/11/13 0433  Weight: 73.2 kg (161 lb 6 oz)    Exam:   General:  Awake, in nad  Cardiovascular: regular, s1, s2, no reproducible chest pain on palpation  Respiratory: normal resp effort,no wheezing  Abdomen: soft, nondistended  Musculoskeletal: perfused, no clubbing   Data Reviewed: Basic Metabolic Panel:  Recent Labs Lab 05/11/13 0030  NA 140  K 4.2  CL 103  CO2 29  GLUCOSE 128*  BUN 20  CREATININE 0.87  CALCIUM 8.8   Liver Function Tests: No results found for this basename: AST, ALT, ALKPHOS, BILITOT, PROT, ALBUMIN,  in the last 168 hours No results found for this basename: LIPASE, AMYLASE,  in the last 168 hours No results found for this basename: AMMONIA,  in the last 168  hours CBC:  Recent Labs Lab 05/11/13 0030  WBC 8.3  HGB 13.6  HCT 39.7  MCV 95.2  PLT 186   Cardiac Enzymes: No results found for this basename: CKTOTAL, CKMB, CKMBINDEX, TROPONINI,  in the last 168 hours BNP (last 3 results) No results found for this basename: PROBNP,  in the last 8760 hours CBG: No results found for this basename: GLUCAP,  in the last 168 hours  No results found for this or any previous visit (from the past 240 hour(s)).   Studies: Dg Chest 2 View  05/11/2013   *RADIOLOGY REPORT*  Clinical Data: Chest pain, shortness of breath  CHEST - 2 VIEW  Comparison: Prior radiograph from 10/05/2005.  Findings: Cardiac and mediastinal silhouettes are stable in size and contour, and remain within normal limits.  The lungs are normally inflated.  Mild linear opacities at the left lung base most likely reflect atelectasis or scarring.  No pulmonary edema or pleural effusion is identified.  Curvilinear opacity within the left perihilar region is also most consistent with atelectasis.  No focal infiltrate identified.  No pneumothorax.  No acute osseous abnormality identified.  IMPRESSION: Left basilar and left perihilar atelectasis.  No acute cardiopulmonary process.   Original Report Authenticated By: Rise Mu, M.D.    Scheduled Meds: . aspirin  325 mg Oral Daily  . finasteride  5 mg Oral QODAY  . hydrochlorothiazide  12.5 mg Oral Daily  . levothyroxine  88 mcg Oral QAC breakfast  . multivitamins with iron  1 tablet Oral Daily   Continuous Infusions:   Principal Problem:   Chest pain Active Problems:   HTN (hypertension)  Time spent:  CHIU, STEPHEN K  [facility] Pager [phone]. If 7PM-7AM, please contact night-coverage at [email or web address], password [facility] 05/11/2013, 11:31 AM  LOS: 1 day

## 2013-05-11 NOTE — ED Provider Notes (Signed)
CSN: [id number]     Arrival date & time 05/10/13  2352 History   First MD Initiated Contact with Patient 05/11/13 0022     Chief Complaint  Patient presents with  . Chest Pain   (Consider location/radiation/quality/duration/timing/severity/associated sxs/prior Treatment) HPI 79 year old male presents to emergency room with complaint of chest pain.  Patient reports he had onset of dull pain stretching across his chest.  Around 5 PM today.  He took 2 aspirin which helped with the pain.  He noticed the pain come on again around 10 PM.  Pain got progressively worse.  He was seen by his living facilities nurse, who recommended he go to the emergency department.  Patient received 2 nitroglycerin with complete resolution of pain.  He denies previous problems with chest pain or MI.  Patient has history of hypertension, elevated cholesterol.  He has been off Lipitor for the past 6 weeks do to some muscle pain.  Patient reports the pain is started to come back slightly.  Past Medical History  Diagnosis Date  . Thyroid disease   . Hypercholesteremia    History reviewed. No pertinent past surgical history. History reviewed. No pertinent family history. History  Substance Use Topics  . Smoking status: Never Smoker   . Smokeless tobacco: Not on file  . Alcohol Use: Yes    Review of Systems  All other systems reviewed and are negative.    Allergies  Cinnamon; Percocet; and Protonix  Home Medications  No current outpatient prescriptions on file. BP 118/65  Pulse 69  Temp(Src) 98.1 F (36.7 C)  Resp 16  SpO2 98% Physical Exam  Nursing note and vitals reviewed. Constitutional: He is oriented to person, place, and time. He appears well-developed and well-nourished. No distress.  HENT:  Head: Normocephalic and atraumatic.  Nose: Nose normal.  Mouth/Throat: Oropharynx is clear and moist.  Eyes: Conjunctivae and EOM are normal. Pupils are equal, round, and reactive to light.  Neck: Normal  range of motion. Neck supple. No JVD present. No tracheal deviation present. No thyromegaly present.  Cardiovascular: Normal rate, regular rhythm, normal heart sounds and intact distal pulses.  Exam reveals no gallop and no friction rub.   No murmur heard. Pulmonary/Chest: Effort normal and breath sounds normal. No stridor. No respiratory distress. He has no wheezes. He has no rales. He exhibits no tenderness.  Abdominal: Soft. Bowel sounds are normal. He exhibits no distension and no mass. There is no tenderness. There is no rebound and no guarding.  Musculoskeletal: Normal range of motion. He exhibits no edema and no tenderness.  Lymphadenopathy:    He has no cervical adenopathy.  Neurological: He is alert and oriented to person, place, and time. He exhibits normal muscle tone. Coordination normal.  Skin: Skin is warm and dry. No rash noted. No erythema. No pallor.  Psychiatric: He has a normal mood and affect. His behavior is normal. Judgment and thought content normal.    ED Course  Procedures (including critical care time) Labs Review Labs Reviewed  CBC - Abnormal; Notable for the following:    RBC 4.17 (*)    All other components within normal limits  BASIC METABOLIC PANEL - Abnormal; Notable for the following:    Glucose, Bld 128 (*)    GFR calc non Af Amer 73 (*)    GFR calc Af Amer 85 (*)    All other components within normal limits  POCT I-STAT TROPONIN I    Date: 05/11/2013  Rate: 69  Rhythm: normal sinus rhythm  QRS Axis: left  Intervals: normal  ST/T Wave abnormalities: ST elevations diffusely  Conduction Disutrbances:left anterior fascicular block  Narrative Interpretation:   Old EKG Reviewed: none available   Imaging Review Dg Chest 2 View  05/11/2013   *RADIOLOGY REPORT*  Clinical Data: Chest pain, shortness of breath  CHEST - 2 VIEW  Comparison: Prior radiograph from 10/05/2005.  Findings: Cardiac and mediastinal silhouettes are stable in size and contour, and  remain within normal limits.  The lungs are normally inflated.  Mild linear opacities at the left lung base most likely reflect atelectasis or scarring.  No pulmonary edema or pleural effusion is identified.  Curvilinear opacity within the left perihilar region is also most consistent with atelectasis.  No focal infiltrate identified.  No pneumothorax.  No acute osseous abnormality identified.  IMPRESSION: Left basilar and left perihilar atelectasis.  No acute cardiopulmonary process.   Original Report Authenticated By: Rise Mu, M.D.    MDM   1. Chest pain     79 year old male with intermittent chest pain since 5 PM.  EKG shows diffuse ST elevation, no reciprocal changes.  Pain is not positional.  Pain was resolved with nitroglycerin.  Patient has  risk factors for coronary disease with hyperlipidemia and hypertension.  May need admission overnight for rule out    Olivia Mackie, MD 05/11/13 [id number]

## 2013-05-12 DIAGNOSIS — R079 Chest pain, unspecified: Secondary | ICD-10-CM

## 2013-05-12 DIAGNOSIS — I4891 Unspecified atrial fibrillation: Principal | ICD-10-CM | POA: Diagnosis not present

## 2013-05-12 DIAGNOSIS — I10 Essential (primary) hypertension: Secondary | ICD-10-CM

## 2013-05-12 LAB — BASIC METABOLIC PANEL
CO2: 25 mEq/L (ref 19–32)
Calcium: 8.6 mg/dL (ref 8.4–10.5)
Creatinine, Ser: 1.06 mg/dL (ref 0.50–1.35)
Glucose, Bld: 111 mg/dL — ABNORMAL HIGH (ref 70–99)

## 2013-05-12 LAB — TROPONIN I: Troponin I: <0.3 ng/mL (ref <0.30)

## 2013-05-12 LAB — HEPARIN LEVEL (UNFRACTIONATED): Heparin Unfractionated: 0.26 IU/mL — ABNORMAL LOW (ref 0.30–0.70)

## 2013-05-12 LAB — MRSA PCR SCREENING: MRSA by PCR: NEGATIVE

## 2013-05-12 MED ORDER — HEPARIN BOLUS VIA INFUSION
2000.0000 [IU] | Freq: Once | INTRAVENOUS | Status: AC
  Administered 2013-05-12: 2000 [IU] via INTRAVENOUS
  Filled 2013-05-12: qty 2000

## 2013-05-12 MED ORDER — HEPARIN (PORCINE) IN NACL 100-0.45 UNIT/ML-% IJ SOLN
1300.0000 [IU]/h | INTRAMUSCULAR | Status: DC
  Administered 2013-05-12: 1150 [IU]/h via INTRAVENOUS
  Administered 2013-05-12: 1000 [IU]/h via INTRAVENOUS
  Administered 2013-05-13: 1300 [IU]/h via INTRAVENOUS
  Filled 2013-05-12 (×4): qty 250

## 2013-05-12 MED ORDER — SODIUM CHLORIDE 0.9 % IV BOLUS (SEPSIS)
500.0000 mL | Freq: Once | INTRAVENOUS | Status: AC
  Administered 2013-05-12: 500 mL via INTRAVENOUS

## 2013-05-12 MED ORDER — AMIODARONE LOAD VIA INFUSION
150.0000 mg | Freq: Once | INTRAVENOUS | Status: AC
  Administered 2013-05-12: 150 mg via INTRAVENOUS
  Filled 2013-05-12: qty 83.34

## 2013-05-12 MED ORDER — COLCHICINE 0.6 MG PO TABS
0.6000 mg | ORAL_TABLET | Freq: Every day | ORAL | Status: DC
  Administered 2013-05-12 – 2013-05-13 (×2): 0.6 mg via ORAL
  Filled 2013-05-12 (×2): qty 1

## 2013-05-12 MED ORDER — IBUPROFEN 400 MG PO TABS
400.0000 mg | ORAL_TABLET | Freq: Three times a day (TID) | ORAL | Status: DC
  Administered 2013-05-12 – 2013-05-13 (×4): 400 mg via ORAL
  Filled 2013-05-12 (×6): qty 1

## 2013-05-12 MED ORDER — SODIUM CHLORIDE 0.9 % IV SOLN
INTRAVENOUS | Status: DC
  Administered 2013-05-12: 10 mL/h via INTRAVENOUS

## 2013-05-12 MED ORDER — AMIODARONE HCL IN DEXTROSE 360-4.14 MG/200ML-% IV SOLN
30.0000 mg/h | INTRAVENOUS | Status: DC
  Administered 2013-05-12 – 2013-05-13 (×3): 30 mg/h via INTRAVENOUS
  Filled 2013-05-12 (×5): qty 200

## 2013-05-12 MED ORDER — AMIODARONE HCL IN DEXTROSE 360-4.14 MG/200ML-% IV SOLN
60.0000 mg/h | INTRAVENOUS | Status: AC
  Administered 2013-05-12 (×2): 60 mg/h via INTRAVENOUS
  Filled 2013-05-12 (×2): qty 200

## 2013-05-12 NOTE — Progress Notes (Addendum)
ANTICOAGULATION CONSULT NOTE - Follow Up Consult  Pharmacy Consult for Heparin Indication: atrial fibrillation  Allergies  Allergen Reactions  . Cinnamon Itching  . Percocet [Oxycodone-Acetaminophen] Other (See Comments)    confusion  . Protonix [Pantoprazole Sodium] Diarrhea and Nausea And Vomiting    Patient Measurements: Height: 5\' 11"  (180.3 cm) Weight: 161 lb 6 oz (73.2 kg) IBW/kg (Calculated) : 75.3  Vital Signs: Temp: 98.8 F (37.1 C) (09/05 2000) Temp src: Oral (09/05 2000) BP: 143/46 mmHg (09/05 2200) Pulse Rate: 61 (09/05 1615)  Labs:  Recent Labs  05/11/13 0030 05/11/13 1210 05/11/13 1756 05/11/13 2311 05/12/13 1230 05/12/13 2130  HGB 13.6  --   --   --   --   --   HCT 39.7  --   --   --   --   --   PLT 186  --   --   --   --   --   HEPARINUNFRC  --   --   --   --  0.20* 0.26*  CREATININE 0.87  --   --   --  1.06  --   TROPONINI  --  <0.30 <0.30 <0.30  --   --     Estimated Creatinine Clearance: 47 ml/min (by C-G formula based on Cr of 1.06).   Medications:  Heparin at 1150 units/hr  Assessment: 79 y/o M with new onset afib who continues on heparin. HL was 0.20 this afternoon and the rate was increased. A level after that rate increase is still low at 0.26. Per RN, no issues with line, drip not held for any reason, no bleeding noted.  Goal of Therapy:  Heparin level 0.3-0.7 units/ml Monitor platelets by anticoagulation protocol: Yes   Plan:  1. Increase heparin drip to 1300 units/hr 2. Check 8 hour HL at 0700 3. Daily CBC/HL   Treonna Klee D. Carolyne Whitsel, PharmD Clinical Pharmacist Pager: [phone] 05/12/2013 10:52 PM

## 2013-05-12 NOTE — Progress Notes (Signed)
ANTICOAGULATION CONSULT NOTE - Initial Consult  Pharmacy Consult for Heparin Indication: atrial fibrillation  Allergies  Allergen Reactions  . Cinnamon Itching  . Percocet [Oxycodone-Acetaminophen] Other (See Comments)    confusion  . Protonix [Pantoprazole Sodium] Diarrhea and Nausea And Vomiting    Patient Measurements: Height: 5\' 11"  (180.3 cm) Weight: 161 lb 6 oz (73.2 kg) IBW/kg (Calculated) : 75.3  Vital Signs: Temp: 97.7 F (36.5 C) (09/05 0120) Temp src: Oral (09/05 0120) BP: 97/51 mmHg (09/05 0300) Pulse Rate: 131 (09/05 0053)  Labs:  Recent Labs  05/11/13 0030 05/11/13 1210 05/11/13 1756 05/11/13 2311  HGB 13.6  --   --   --   HCT 39.7  --   --   --   PLT 186  --   --   --   CREATININE 0.87  --   --   --   TROPONINI  --  <0.30 <0.30 <0.30    Estimated Creatinine Clearance: 57.3 ml/min (by C-G formula based on Cr of 0.87).   Medical History: Past Medical History  Diagnosis Date  . Thyroid disease   . Hypercholesteremia   . HTN (hypertension)   . Hypothyroidism     Medications:  Prescriptions prior to admission  Medication Sig Dispense Refill  . aspirin 325 MG tablet Take 325 mg by mouth daily.      . finasteride (PROSCAR) 5 MG tablet Take 5 mg by mouth every other day. Dose is taken at bedtime      . hydrochlorothiazide (HYDRODIURIL) 12.5 MG tablet Take 12.5 mg by mouth daily.       Marland Kitchen levothyroxine (SYNTHROID, LEVOTHROID) 88 MCG tablet Take 88 mcg by mouth daily before breakfast.       . Multiple Vitamins-Iron (MULTIVITAMINS WITH IRON) TABS tablet Take 1 tablet by mouth daily.        Assessment: 79 yo male with new onset Afib for heparin  Goal of Therapy:  Heparin level 0.3-0.7 units/ml Monitor platelets by anticoagulation protocol: Yes   Plan:  Heparin 2000 units IV bolus, then 1000 units/hr Check heparin level in 8 hours.  Eddie Candle 05/12/2013,3:53 AM

## 2013-05-12 NOTE — Progress Notes (Signed)
[facility] PROGRESS NOTE  Nathaniel [name] ZOX:[id number] DOB: November 22, 1919 DOA: 05/10/2013 PCP: No primary provider on file.  Assessment/Plan: 1. Chest pain  1. Recurrent CP noted overnight, improved (within ) with NTG 2. Denies sob, diaphoresis, or n/v 3. Cardiology recs noted - initial plans were for stress today. Will defer to Cardiology 2. Afib w/ RVR 1. Overnight events noted. Pt transferred to stepdown 2. Cardiology recs noted - CHADS of 2, so on anticoagulation 3. Started amio and bblocker 3. HTN 1. continue home meds 2. Currently stable  Code Status: DNR Family Communication: Pt in room (indicate person spoken with, relationship, and if by phone, the number) Disposition Plan: Pending  Consultants:  Cardiology  HPI/Subjective: Noted to have afib w/ RVR overnight, transferred to stepdown for better rate control.  Objective: Filed Vitals:   05/12/13 0300 05/12/13 0400 05/12/13 0600 05/12/13 0800  BP: 97/51 94/48 97/35    Pulse:      Temp:  97.8 F (36.6 C)  98.2 F (36.8 C)  TempSrc:  Oral    Resp: 16 32 27 18  Height:      Weight:      SpO2:  92%  94%    Intake/Output Summary (Last 24 hours) at 05/12/13 0842 Last data filed at 05/12/13 0600  Gross per 24 hour  Intake 1259.73 ml  Output    500 ml  Net 759.73 ml   Filed Weights   05/11/13 0433  Weight: 73.2 kg (161 lb 6 oz)    Exam:   General:  Awake, in nad  Cardiovascular: regular, s1, s2, no reproducible chest pain on palpation  Respiratory: normal resp effort,no wheezing  Abdomen: soft, nondistended  Musculoskeletal: perfused, no clubbing   Data Reviewed: Basic Metabolic Panel:  Recent Labs Lab 05/11/13 0030  NA 140  K 4.2  CL 103  CO2 29  GLUCOSE 128*  BUN 20  CREATININE 0.87  CALCIUM 8.8   Liver Function Tests: No results found for this basename: AST, ALT, ALKPHOS, BILITOT, PROT, ALBUMIN,  in the last 168 hours No results found for this basename: LIPASE, AMYLASE,   in the last 168 hours No results found for this basename: AMMONIA,  in the last 168 hours CBC:  Recent Labs Lab 05/11/13 0030  WBC 8.3  HGB 13.6  HCT 39.7  MCV 95.2  PLT 186   Cardiac Enzymes:  Recent Labs Lab 05/11/13 1210 05/11/13 1756 05/11/13 2311  TROPONINI <0.30 <0.30 <0.30   BNP (last 3 results) No results found for this basename: PROBNP,  in the last 8760 hours CBG: No results found for this basename: GLUCAP,  in the last 168 hours  Recent Results (from the past 240 hour(s))  MRSA PCR SCREENING     Status: None   Collection Time    05/12/13  2:20 AM      Result Value Range Status   MRSA by PCR NEGATIVE  NEGATIVE Final   Comment:            The GeneXpert MRSA Assay (FDA     approved for NASAL specimens     only), is one component of a     comprehensive MRSA colonization     surveillance program. It is not     intended to diagnose MRSA     infection nor to guide or     monitor treatment for     MRSA infections.     Studies: Dg Chest 2 View  05/11/2013   *RADIOLOGY  REPORT*  Clinical Data: Chest pain, shortness of breath  CHEST - 2 VIEW  Comparison: Prior radiograph from 10/05/2005.  Findings: Cardiac and mediastinal silhouettes are stable in size and contour, and remain within normal limits.  The lungs are normally inflated.  Mild linear opacities at the left lung base most likely reflect atelectasis or scarring.  No pulmonary edema or pleural effusion is identified.  Curvilinear opacity within the left perihilar region is also most consistent with atelectasis.  No focal infiltrate identified.  No pneumothorax.  No acute osseous abnormality identified.  IMPRESSION: Left basilar and left perihilar atelectasis.  No acute cardiopulmonary process.   Original Report Authenticated By: Rise Mu, M.D.    Scheduled Meds: . aspirin  325 mg Oral Daily  . finasteride  5 mg Oral QODAY  . hydrochlorothiazide  12.5 mg Oral Daily  . levothyroxine  88 mcg Oral QAC  breakfast  . multivitamins with iron  1 tablet Oral Daily   Continuous Infusions: . amiodarone (NEXTERONE PREMIX) 360 mg/200 mL dextrose 60 mg/hr (05/12/13 0819)   Followed by  . amiodarone (NEXTERONE PREMIX) 360 mg/200 mL dextrose    . heparin 1,000 Units/hr (05/12/13 0408)    Principal Problem:   Chest pain Active Problems:   HTN (hypertension)   Atrial fibrillation  Time spent:  CHIU, STEPHEN K  [facility] Pager [phone]. If 7PM-7AM, please contact night-coverage at [email or web address], password [facility] 05/12/2013, 8:42 AM  LOS: 2 days

## 2013-05-12 NOTE — Progress Notes (Signed)
The reason Dr. Terressa Koyanagi was contacted was patient's HR was in the 140 - 160's. When EKG was performed the 1st time it showed A-flutter. However, upon continuous increased, HR in the 140 - 160's,  the patient converted to A-Fib. Nurse contacted Dr. Laban Emperor 1st, and he instructed the nurse to page NP Claiborne Billings, then Claiborne Billings instructed the nurse to call whoever was on call for cardiology. Nurse paged Dr. Terressa Koyanagi. Harmon Pier

## 2013-05-12 NOTE — Progress Notes (Signed)
ANTICOAGULATION CONSULT NOTE - Follow Up Consult  Pharmacy Consult for Heparin Indication: atrial fibrillation  Allergies  Allergen Reactions  . Cinnamon Itching  . Percocet [Oxycodone-Acetaminophen] Other (See Comments)    confusion  . Protonix [Pantoprazole Sodium] Diarrhea and Nausea And Vomiting    Patient Measurements: Height: 5\' 11"  (180.3 cm) Weight: 161 lb 6 oz (73.2 kg) IBW/kg (Calculated) : 75.3  Vital Signs: Temp: 98.3 F (36.8 C) (09/05 1131) Temp src: Oral (09/05 1131) BP: 105/54 mmHg (09/05 1000)  Labs:  Recent Labs  05/11/13 0030 05/11/13 1210 05/11/13 1756 05/11/13 2311 05/12/13 1230  HGB 13.6  --   --   --   --   HCT 39.7  --   --   --   --   PLT 186  --   --   --   --   HEPARINUNFRC  --   --   --   --  0.20*  CREATININE 0.87  --   --   --   --   TROPONINI  --  <0.30 <0.30 <0.30  --     Estimated Creatinine Clearance: 57.3 ml/min (by C-G formula based on Cr of 0.87).   Medications:  Heparin at 1000 units/hr  Assessment: 79 y/o M with new onset afib who continues on heparin. HL is 0.20 this afternoon. Hgb 13.6 from 9/4. Scr 0.87 with CrCl ~ 55-60. No overt bleeding noted.   Goal of Therapy:  Heparin level 0.3-0.7 units/ml Monitor platelets by anticoagulation protocol: Yes   Plan:  -Increase heparin drip to 1150 units/hr -Check 8 hour HL at 2130 -Daily CBC/HL -Monitor for bleeding -F/U start of oral anticoagulation if appropriate  Thank you for allowing me to take part in this patient's care,  Abran Duke, PharmD Clinical Pharmacist Phone: [phone] Pager: [phone] 05/12/2013 1:19 PM

## 2013-05-12 NOTE — Progress Notes (Signed)
Nurse administered 2 nitro for CP before Drs, Julian Reil, NP Claiborne Billings and [place] were contacted. Nathaniel [name]

## 2013-05-12 NOTE — Progress Notes (Signed)
Nurse paged NP Claiborne Billings. NP Claiborne Billings instructed the nurse to call whoever was on call for [facility], as they would probably have better knowledge of what course to take. The nurse paged Dr. Terressa Koyanagi from [place]. Dr. Terressa Koyanagi came up on the unit to assess patient. He instructed the nurse to obtain another set of vitals. Nurse tech obtained vitals, BP 71/79. EKG  Obtained again. Dr. Terressa Koyanagi instructed the nurse to administer a 500 cc bolus of NS, and to transfer the patient to stepdwn. Dr. Terressa Koyanagi instructed the nurse to contact NP Claiborne Billings to inform him of the course of action. The nurse performed as was instructed.

## 2013-05-12 NOTE — Plan of Care (Signed)
Brief Note  CC: change in rhythm  Called for [facility] by RN due to change in rhythm.  Patient went from sinus rhythm to atrial fibrillation with RVR.  He had abd pain at the time, but denied any chest pain or shortness of breath.  Repeat BP checks revealed SBP 70s to 80s.  Impression: Afib with RVR  Plan: Initiate normal saline bolus Transfer to higher level of care IV Heparin ordered IV Amiodarone ordered

## 2013-05-12 NOTE — Progress Notes (Addendum)
Primary cardiologist:  Subjective:   Patient with episode of atrial fibrillation w/ RVR to 140s-160s last night, reported SBPs 70-80s. Transferred to ICU. Given saline bolus, started on IV heparin and amiodarone.    Objective:   Temp:  [97.7 F (36.5 C)-100 F (37.8 C)] 97.8 F (36.6 C) (09/05 0400) Pulse Rate:  [62-131] 131 (09/05 0053) Resp:  [16-32] 27 (09/05 0600) BP: (71-168)/(35-73) 97/35 mmHg (09/05 0600) SpO2:  [91 %-96 %] 92 % (09/05 0400) Last BM Date: 05/10/13  Filed Weights   05/11/13 0433  Weight: 161 lb 6 oz (73.2 kg)    Intake/Output Summary (Last 24 hours) at 05/12/13 0813 Last data filed at 05/12/13 0600  Gross per 24 hour  Intake 1259.73 ml  Output    500 ml  Net 759.73 ml    Telemetry:  Exam:  General: NAD  HEENT: no scleral icterus, pupils equal round and reactive  Lungs: CTAB  Cardiac: RRR, no m/r/g, no jvd, no carotid bruits  Abdomen: soft, NT, ND  Extremities: warm, no LE edema  Lab Results:  Basic Metabolic Panel:  Recent Labs Lab 05/11/13 0030  NA 140  K 4.2  CL 103  CO2 29  GLUCOSE 128*  BUN 20  CREATININE 0.87  CALCIUM 8.8    Liver Function Tests: No results found for this basename: AST, ALT, ALKPHOS, BILITOT, PROT, ALBUMIN,  in the last 168 hours  CBC:  Recent Labs Lab 05/11/13 0030  WBC 8.3  HGB 13.6  HCT 39.7  MCV 95.2  PLT 186    Cardiac Enzymes:  Recent Labs Lab 05/11/13 1210 05/11/13 1756 05/11/13 2311  TROPONINI <0.30 <0.30 <0.30    BNP: No results found for this basename: PROBNP,  in the last 8760 hours  Coagulation: No results found for this basename: INR,  in the last 168 hours  ECG: afib rate 120s   Medications:   Scheduled Medications: . aspirin  325 mg Oral Daily  . finasteride  5 mg Oral QODAY  . hydrochlorothiazide  12.5 mg Oral Daily  . levothyroxine  88 mcg Oral QAC breakfast  . multivitamins with iron  1 tablet Oral Daily     Infusions: . amiodarone (NEXTERONE  PREMIX) 360 mg/200 mL dextrose 60 mg/hr (05/12/13 0407)   Followed by  . amiodarone (NEXTERONE PREMIX) 360 mg/200 mL dextrose    . heparin 1,000 Units/hr (05/12/13 0408)     PRN Medications:  acetaminophen, ibuprofen, nitroGLYCERIN, ondansetron (ZOFRAN) IV   Echo: LVEF 55-60%, normal wall motion, grade I DDX, mod LAE  Assessment/Plan  1. Afib w/ RVR: back in sinus rhythm now based on tele, 12 lead pending. Echo shows normal LVEF 55-60%, normal wall motion.   - continue amio 24 hr load, will then transition to oral amio and start low dose oral beta blocker. - CHADS2 score is 2, pt will require anticoagulation, continue hepatin in the ICU setting.  - check TSH  2. Chest pain: resolved this morning, trops negative w/ normal echo, EKG not specific for ischemia. Description of pain atypical for ischemia.    Addendum: f/u EKG shows NSR, rate 70 w/ diffuse upsloping ST elevations, PR depression and PR elevation in aVR. More characteristic EKG findings of pericarditis than prior EKGs, no rub on exam or effusion on echo. - ibuprofen 400mg  tid (conservative dosing given his age) and colchicine 0.6 mg po qday - okay for patient to eat.      Antoine Poche M.D., F.A.C.C.

## 2013-05-12 NOTE — Progress Notes (Signed)
Nurse contacted NP Claiborne Billings. Nurse informed Claiborne Billings as to Dr. Skipper Cliche orders. Which was the instruction of Dr. Terressa Koyanagi. Harmon Pier

## 2013-05-13 DIAGNOSIS — I309 Acute pericarditis, unspecified: Secondary | ICD-10-CM

## 2013-05-13 LAB — BASIC METABOLIC PANEL
CO2: 25 mEq/L (ref 19–32)
Calcium: 8.8 mg/dL (ref 8.4–10.5)
Chloride: 101 mEq/L (ref 96–112)
Glucose, Bld: 101 mg/dL — ABNORMAL HIGH (ref 70–99)
Sodium: 135 mEq/L (ref 135–145)

## 2013-05-13 LAB — CBC
HCT: 37 % — ABNORMAL LOW (ref 39.0–52.0)
Hemoglobin: 12.4 g/dL — ABNORMAL LOW (ref 13.0–17.0)
MCH: 31.6 pg (ref 26.0–34.0)
MCHC: 33.5 g/dL (ref 30.0–36.0)

## 2013-05-13 MED ORDER — METOPROLOL SUCCINATE ER 25 MG PO TB24
25.0000 mg | ORAL_TABLET | Freq: Every day | ORAL | Status: DC
  Administered 2013-05-13 – 2013-05-14 (×2): 25 mg via ORAL
  Filled 2013-05-13 (×2): qty 1

## 2013-05-13 MED ORDER — COLCHICINE 0.6 MG PO TABS
0.6000 mg | ORAL_TABLET | Freq: Two times a day (BID) | ORAL | Status: DC
  Administered 2013-05-13 – 2013-05-14 (×2): 0.6 mg via ORAL
  Filled 2013-05-13 (×4): qty 1

## 2013-05-13 MED ORDER — RIVAROXABAN 15 MG PO TABS
15.0000 mg | ORAL_TABLET | Freq: Every day | ORAL | Status: DC
  Administered 2013-05-13 – 2013-05-14 (×2): 15 mg via ORAL
  Filled 2013-05-13 (×2): qty 1

## 2013-05-13 MED ORDER — AMIODARONE HCL 200 MG PO TABS
200.0000 mg | ORAL_TABLET | Freq: Two times a day (BID) | ORAL | Status: DC
  Administered 2013-05-13 – 2013-05-14 (×3): 200 mg via ORAL
  Filled 2013-05-13 (×5): qty 1

## 2013-05-13 NOTE — Progress Notes (Signed)
Patient ID: Nathaniel [name], male   DOB: 03/05/1920, 79 y.o.   MRN: [id number]    SUBJECTIVE: No chest pain or dyspnea.  He remains in NSR.  He is doing well overall.    . colchicine  0.6 mg Oral BID  . finasteride  5 mg Oral QODAY  . hydrochlorothiazide  12.5 mg Oral Daily  . levothyroxine  88 mcg Oral QAC breakfast  . multivitamins with iron  1 tablet Oral Daily  amiodarone gtt Heparin gtt    Filed Vitals:   05/13/13 0000 05/13/13 0200 05/13/13 0400 05/13/13 0800  BP: 152/67 146/42 119/53 148/65  Pulse:      Temp: 98.6 F (37 C)  97.7 F (36.5 C)   TempSrc: Oral  Oral   Resp: 19 14 13    Height:      Weight:      SpO2: 97% 95% 99%     Intake/Output Summary (Last 24 hours) at 05/13/13 1045 Last data filed at 05/13/13 0700  Gross per 24 hour  Intake 870.58 ml  Output    750 ml  Net 120.58 ml    LABS: Basic Metabolic Panel:  Recent Labs  40/98/11 0030 05/12/13 1230  NA 140 137  K 4.2 3.7  CL 103 99  CO2 29 25  GLUCOSE 128* 111*  BUN 20 22  CREATININE 0.87 1.06  CALCIUM 8.8 8.6  MG  --  2.1   Liver Function Tests: No results found for this basename: AST, ALT, ALKPHOS, BILITOT, PROT, ALBUMIN,  in the last 72 hours No results found for this basename: LIPASE, AMYLASE,  in the last 72 hours CBC:  Recent Labs  05/11/13 0030 05/13/13 0540  WBC 8.3 8.4  HGB 13.6 12.4*  HCT 39.7 37.0*  MCV 95.2 94.4  PLT 186 160   Cardiac Enzymes:  Recent Labs  05/11/13 1210 05/11/13 1756 05/11/13 2311  TROPONINI <0.30 <0.30 <0.30   BNP: No components found with this basename: POCBNP,  D-Dimer: No results found for this basename: DDIMER,  in the last 72 hours Hemoglobin A1C: No results found for this basename: HGBA1C,  in the last 72 hours Fasting Lipid Panel: No results found for this basename: CHOL, HDL, LDLCALC, TRIG, CHOLHDL, LDLDIRECT,  in the last 72 hours Thyroid Function Tests:  Recent Labs  05/12/13 1230  TSH 0.986   Anemia Panel: No results  found for this basename: VITAMINB12, FOLATE, FERRITIN, TIBC, IRON, RETICCTPCT,  in the last 72 hours  RADIOLOGY: Dg Chest 2 View  05/11/2013   *RADIOLOGY REPORT*  Clinical Data: Chest pain, shortness of breath  CHEST - 2 VIEW  Comparison: Prior radiograph from 10/05/2005.  Findings: Cardiac and mediastinal silhouettes are stable in size and contour, and remain within normal limits.  The lungs are normally inflated.  Mild linear opacities at the left lung base most likely reflect atelectasis or scarring.  No pulmonary edema or pleural effusion is identified.  Curvilinear opacity within the left perihilar region is also most consistent with atelectasis.  No focal infiltrate identified.  No pneumothorax.  No acute osseous abnormality identified.  IMPRESSION: Left basilar and left perihilar atelectasis.  No acute cardiopulmonary process.   Original Report Authenticated By: Rise Mu, M.D.    PHYSICAL EXAM General: NAD Neck: No JVD, no thyromegaly or thyroid nodule.  Lungs: Clear to auscultation bilaterally with normal respiratory effort. CV: Nondisplaced PMI.  Heart regular S1/S2, no S3/S4, no murmur, no rub.  No peripheral edema.  No carotid bruit.  Normal pedal pulses.  Abdomen: Soft, nontender, no hepatosplenomegaly, no distention.  Neurologic: Alert and oriented x 3.  Psych: Normal affect. Extremities: No clubbing or cyanosis.   TELEMETRY: Reviewed telemetry pt in NSR  ASSESSMENT AND PLAN: 79 yo admitted with atypical chest pain, developed atrial fibrillation with RVR while in the hospital.  Now back in NSR on heparin and amiodarone gtts.  1. Atrial fibrillation: Afib with RVR noted in hospital, now back in NSR on amiodarone gtt.  ? Whether PAF was the cause of his admission symptoms (and had gone back into NSR by arrival to hospital).  Other possibility is that he has acute pericarditis that triggered atrial fibrillation.  - Can transition to po amiodarone today.  Will continue  amiodarone for a couple of months and then decide on long-term need.  - Start Toprol XL 25 mg daily. - Will transition off heparin gtt and onto Xarelto for long-term use.  Can stop ASA.  2. Chest pain: Atypical.  The pain was not pleuritic.  Cardiac enzymes negative.  No friction rub.  Normal EF and no pericardial effusion on echo.  However, ECG does show diffuse mild ST elevation that could be consistent with acute pericarditis.  No old ECG. His chest pain has totally resolved.  I am unclear if this is truly pericarditis.  Would help to have old ECG.  I am going to stop Ibuprofen for now and will just keep him on colchicine.  Risk of pericardial hemorrhage with anticoagulation in acute pericarditis is somewhat controversial but probably very rare based on the available evidence.  No problems with heparin so far.  I am going to stop heparin gtt and start Xarelto for long-term use.  I am going to monitor him overnight, probably discharge in am tomorrow.  He can followup with me.   Can go to telemetry.   Marca Ancona 05/13/2013 10:54 AM

## 2013-05-13 NOTE — Progress Notes (Signed)
ANTICOAGULATION CONSULT NOTE - Initial Consult  Pharmacy Consult for Xarelto Indication: atrial fibrillation  Allergies  Allergen Reactions  . Cinnamon Itching  . Percocet [Oxycodone-Acetaminophen] Other (See Comments)    confusion  . Protonix [Pantoprazole Sodium] Diarrhea and Nausea And Vomiting    Patient Measurements: Height: 5\' 11"  (180.3 cm) Weight: 161 lb 6 oz (73.2 kg) IBW/kg (Calculated) : 75.3  Vital Signs: Temp: 97.7 F (36.5 C) (09/06 0400) Temp src: Oral (09/06 0400) BP: 148/65 mmHg (09/06 0800)  Labs:  Recent Labs  05/11/13 0030 05/11/13 1210 05/11/13 1756 05/11/13 2311 05/12/13 1230 05/12/13 2130 05/13/13 0540 05/13/13 0808  HGB 13.6  --   --   --   --   --  12.4*  --   HCT 39.7  --   --   --   --   --  37.0*  --   PLT 186  --   --   --   --   --  160  --   HEPARINUNFRC  --   --   --   --  0.20* 0.26*  --  0.38  CREATININE 0.87  --   --   --  1.06  --   --   --   TROPONINI  --  <0.30 <0.30 <0.30  --   --   --   --     Estimated Creatinine Clearance: 47 ml/min (by C-G formula based on Cr of 1.06).   Medical History: Past Medical History  Diagnosis Date  . Thyroid disease   . Hypercholesteremia   . HTN (hypertension)   . Hypothyroidism    Assessment: 79 y/o M with new onset A-fib transitioning from heparin to Xarelto. CBC good, renal function ok with Scr 1.06 (at best given age, patient will have CrCl ~ 45-50). No overt bleeding noted.   Goal of Therapy:  Monitor platelets by anticoagulation protocol: Yes   Plan:  -Xarelto 15mg /day (with supper) -Minimum q72h CBC while on Xarelto -Trend renal function -Will need Xarelto education  Thank you for allowing me to take part in this patient's care,  Abran Duke, PharmD Clinical Pharmacist Phone: [phone] Pager: [phone] 05/13/2013 10:58 AM

## 2013-05-13 NOTE — Progress Notes (Signed)
[facility] PROGRESS NOTE  Nathaniel [name]:[id number] DOB: 08-25-1920 DOA: 05/10/2013 PCP: No primary provider on file.  Assessment/Plan: 1. Chest pain  1. Appreciate Cardiology input 2. Question possible pericarditis - now on colchicine 3. Asymptomatic presently w/o overnight events 4. Heparin gtt and ASA stopped per Cards, cont on Xarelto 2. Afib w/ RVR 1. Plans for amiodarone and Bblocker with long term xarelto per cardiology 2. Remains in NSR 3. HTN 1. continue home meds 2. Currently stable 4. DVT prophylaxis 1. On therapeutic anticoagulation  Code Status: DNR Family Communication: Pt in room (indicate person spoken with, relationship, and if by phone, the number) Disposition Plan: Poss d/c home tomorrow  Consultants:  Cardiology  HPI/Subjective: Noted to have afib w/ RVR overnight, transferred to stepdown for better rate control.  Objective: Filed Vitals:   05/13/13 0000 05/13/13 0200 05/13/13 0400 05/13/13 0800  BP: 152/67 146/42 119/53 148/65  Pulse:      Temp: 98.6 F (37 C)  97.7 F (36.5 C) 98.3 F (36.8 C)  TempSrc: Oral  Oral Oral  Resp: 19 14 13    Height:      Weight:      SpO2: 97% 95% 99%     Intake/Output Summary (Last 24 hours) at 05/13/13 1201 Last data filed at 05/13/13 1000  Gross per 24 hour  Intake 1229.68 ml  Output    750 ml  Net 479.68 ml   Filed Weights   05/11/13 0433  Weight: 73.2 kg (161 lb 6 oz)    Exam:   General:  Awake, in nad  Cardiovascular: regular, s1, s2, no reproducible chest pain on palpation  Respiratory: normal resp effort,no wheezing  Abdomen: soft, nondistended  Musculoskeletal: perfused, no clubbing   Data Reviewed: Basic Metabolic Panel:  Recent Labs Lab 05/11/13 0030 05/12/13 1230  NA 140 137  K 4.2 3.7  CL 103 99  CO2 29 25  GLUCOSE 128* 111*  BUN 20 22  CREATININE 0.87 1.06  CALCIUM 8.8 8.6  MG  --  2.1   Liver Function Tests: No results found for this basename: AST,  ALT, ALKPHOS, BILITOT, PROT, ALBUMIN,  in the last 168 hours No results found for this basename: LIPASE, AMYLASE,  in the last 168 hours No results found for this basename: AMMONIA,  in the last 168 hours CBC:  Recent Labs Lab 05/11/13 0030 05/13/13 0540  WBC 8.3 8.4  HGB 13.6 12.4*  HCT 39.7 37.0*  MCV 95.2 94.4  PLT 186 160   Cardiac Enzymes:  Recent Labs Lab 05/11/13 1210 05/11/13 1756 05/11/13 2311  TROPONINI <0.30 <0.30 <0.30   BNP (last 3 results) No results found for this basename: PROBNP,  in the last 8760 hours CBG: No results found for this basename: GLUCAP,  in the last 168 hours  Recent Results (from the past 240 hour(s))  MRSA PCR SCREENING     Status: None   Collection Time    05/12/13  2:20 AM      Result Value Range Status   MRSA by PCR NEGATIVE  NEGATIVE Final   Comment:            The GeneXpert MRSA Assay (FDA     approved for NASAL specimens     only), is one component of a     comprehensive MRSA colonization     surveillance program. It is not     intended to diagnose MRSA     infection nor to guide or  monitor treatment for     MRSA infections.     Studies: No results found.  Scheduled Meds: . amiodarone  200 mg Oral BID  . colchicine  0.6 mg Oral BID  . finasteride  5 mg Oral QODAY  . hydrochlorothiazide  12.5 mg Oral Daily  . levothyroxine  88 mcg Oral QAC breakfast  . metoprolol succinate  25 mg Oral Daily  . multivitamins with iron  1 tablet Oral Daily  . rivaroxaban  15 mg Oral Q supper   Continuous Infusions: . sodium chloride 10 mL/hr (05/12/13 1947)    Principal Problem:   Chest pain Active Problems:   HTN (hypertension)   Atrial fibrillation  Time spent:  Infinity Jeffords K  [facility] Pager [phone]. If 7PM-7AM, please contact night-coverage at [email or web address], password [facility] 05/13/2013, 12:01 PM  LOS: 3 days

## 2013-05-13 NOTE — Progress Notes (Signed)
ANTICOAGULATION CONSULT NOTE - Follow Up Consult  Pharmacy Consult for Heparin Indication: atrial fibrillation  Allergies  Allergen Reactions  . Cinnamon Itching  . Percocet [Oxycodone-Acetaminophen] Other (See Comments)    confusion  . Protonix [Pantoprazole Sodium] Diarrhea and Nausea And Vomiting    Patient Measurements: Height: 5\' 11"  (180.3 cm) Weight: 161 lb 6 oz (73.2 kg) IBW/kg (Calculated) : 75.3  Vital Signs: Temp: 97.7 F (36.5 C) (09/06 0400) Temp src: Oral (09/06 0400) BP: 148/65 mmHg (09/06 0800)  Labs:  Recent Labs  05/11/13 0030 05/11/13 1210 05/11/13 1756 05/11/13 2311 05/12/13 1230 05/12/13 2130 05/13/13 0540 05/13/13 0808  HGB 13.6  --   --   --   --   --  12.4*  --   HCT 39.7  --   --   --   --   --  37.0*  --   PLT 186  --   --   --   --   --  160  --   HEPARINUNFRC  --   --   --   --  0.20* 0.26*  --  0.38  CREATININE 0.87  --   --   --  1.06  --   --   --   TROPONINI  --  <0.30 <0.30 <0.30  --   --   --   --     Estimated Creatinine Clearance: 47 ml/min (by C-G formula based on Cr of 1.06).   Medications:  Heparin at 1300 units/hr  Assessment: 79 y/o M with new onset afib who continues on heparin. HL is 0.38 this AM. Hgb 12.4, Scr 1.06 with CrCl ~ 50. No overt bleeding noted.   Goal of Therapy:  Heparin level 0.3-0.7 units/ml Monitor platelets by anticoagulation protocol: Yes   Plan:  -Continue heparin drip at 1300 units/hr -Check 8 hour HL at 1600 to confirm -Daily CBC/HL -Monitor for bleeding -F/U start of oral anticoagulation if appropriate  Thank you for allowing me to take part in this patient's care,  Abran Duke, PharmD Clinical Pharmacist Phone: [phone] Pager: [phone] 05/13/2013 8:56 AM

## 2013-05-14 LAB — COMPREHENSIVE METABOLIC PANEL
AST: 20 U/L (ref 0–37)
Albumin: 2.6 g/dL — ABNORMAL LOW (ref 3.5–5.2)
Alkaline Phosphatase: 87 U/L (ref 39–117)
Chloride: 100 mEq/L (ref 96–112)
Potassium: 3.4 mEq/L — ABNORMAL LOW (ref 3.5–5.1)
Sodium: 135 mEq/L (ref 135–145)
Total Bilirubin: 0.8 mg/dL (ref 0.3–1.2)

## 2013-05-14 LAB — CBC
HCT: 37.2 % — ABNORMAL LOW (ref 39.0–52.0)
MCH: 32.1 pg (ref 26.0–34.0)
MCV: 93.9 fL (ref 78.0–100.0)
Platelets: 174 10*3/uL (ref 150–400)
RBC: 3.96 MIL/uL — ABNORMAL LOW (ref 4.22–5.81)

## 2013-05-14 MED ORDER — AMIODARONE HCL 200 MG PO TABS: 200.0000 mg | ORAL_TABLET | Freq: Two times a day (BID) | ORAL | Status: DC

## 2013-05-14 MED ORDER — POTASSIUM CHLORIDE CRYS ER 20 MEQ PO TBCR: 20.0000 meq | EXTENDED_RELEASE_TABLET | Freq: Every day | ORAL | Status: DC

## 2013-05-14 MED ORDER — COLCHICINE 0.6 MG PO TABS: 0.6000 mg | ORAL_TABLET | Freq: Two times a day (BID) | ORAL | Status: DC

## 2013-05-14 MED ORDER — POTASSIUM CHLORIDE CRYS ER 20 MEQ PO TBCR
20.0000 meq | EXTENDED_RELEASE_TABLET | Freq: Every day | ORAL | Status: DC
  Administered 2013-05-14: 20 meq via ORAL
  Filled 2013-05-14: qty 1

## 2013-05-14 MED ORDER — RIVAROXABAN 15 MG PO TABS: 15.0000 mg | ORAL_TABLET | Freq: Every day | ORAL | Status: DC

## 2013-05-14 MED ORDER — METOPROLOL SUCCINATE 12.5 MG HALF TABLET: 12.5000 mg | ORAL_TABLET | Freq: Every day | ORAL | Status: DC

## 2013-05-14 NOTE — Progress Notes (Signed)
Subjective:  Patient is feeling well.  No chest pain or shortness of breath.  Rhythm remains sinus bradycardia.  Objective:  Vital Signs in the last 24 hours: Temp:  [97.6 F (36.4 C)-98.7 F (37.1 C)] 98.7 F (37.1 C) (09/07 0527) Pulse Rate:  [56-64] 56 (09/07 0527) Resp:  [16-18] 16 (09/07 0527) BP: (126-153)/(53-68) 126/68 mmHg (09/07 0527) SpO2:  [95 %-99 %] 95 % (09/07 0527)  Intake/Output from previous day: 09/06 0701 - 09/07 0700 In: 359.1 [P.O.:240; I.V.:119.1] Out: -  Intake/Output from this shift: Total I/O In: 360 [P.O.:360] Out: -   . amiodarone  200 mg Oral BID  . colchicine  0.6 mg Oral BID  . finasteride  5 mg Oral QODAY  . hydrochlorothiazide  12.5 mg Oral Daily  . levothyroxine  88 mcg Oral QAC breakfast  . metoprolol succinate  25 mg Oral Daily  . multivitamins with iron  1 tablet Oral Daily  . potassium chloride  20 mEq Oral Daily  . rivaroxaban  15 mg Oral Q supper   . sodium chloride 10 mL/hr (05/12/13 1947)    Physical Exam: The patient appears to be in no distress.  Head and neck exam reveals that the pupils are equal and reactive.  The extraocular movements are full.  There is no scleral icterus.  Mouth and pharynx are benign.  No lymphadenopathy.  No carotid bruits.  The jugular venous pressure is normal.  Thyroid is not enlarged or tender.  Chest is clear to percussion and auscultation.  No rales or rhonchi.  Expansion of the chest is symmetrical.  Heart reveals no abnormal lift or heave.  First and second heart sounds are normal.  There is no murmur gallop rub or click.  The abdomen is soft and nontender.  Bowel sounds are normoactive.  There is no hepatosplenomegaly or mass.  There are no abdominal bruits.  Extremities reveal no phlebitis or edema.  Pedal pulses are good.  There is no cyanosis or clubbing.  Neurologic exam is normal strength and no lateralizing weakness.  No sensory deficits.  Integument reveals no rash  Lab  Results:  Recent Labs  05/13/13 0540 05/14/13 0505  WBC 8.4 7.0  HGB 12.4* 12.7*  PLT 160 174    Recent Labs  05/13/13 1210 05/14/13 0505  NA 135 135  K 3.4* 3.4*  CL 101 100  CO2 25 25  GLUCOSE 101* 117*  BUN 17 14  CREATININE 0.71 0.70    Recent Labs  05/11/13 1756 05/11/13 2311  TROPONINI <0.30 <0.30   Hepatic Function Panel  Recent Labs  05/14/13 0505  PROT 6.0  ALBUMIN 2.6*  AST 20  ALT 23  ALKPHOS 87  BILITOT 0.8   No results found for this basename: CHOL,  in the last 72 hours No results found for this basename: PROTIME,  in the last 72 hours  Imaging: No results found.  Cardiac Studies: Telemetry shows sinus bradycardia Assessment/Plan:  79 yo admitted with atypical chest pain, developed atrial fibrillation with RVR while in the hospital. Now back in NSR. 1. Atrial fibrillation: Afib with RVR noted in hospital, ? Whether PAF was the cause of his admission symptoms (and had gone back into NSR by arrival to hospital). Other possibility is that he has acute pericarditis that triggered atrial fibrillation.   Will continue amiodarone for a couple of months and then decide on long-term need.  On Toprol XL 25 mg daily.   2. Chest pain: Atypical. The  pain was not pleuritic. Cardiac enzymes negative. No friction rub. Normal EF and no pericardial effusion on echo. However, ECG does show diffuse mild ST elevation that could be consistent with acute pericarditis. No old ECG. His chest pain has totally resolved. I am unclear if this is truly pericarditis. Would help to have old ECG. I am going to stop Ibuprofen for now and will just keep him on colchicine. Risk of pericardial hemorrhage with anticoagulation in acute pericarditis is somewhat controversial but probably very rare based on the available evidence.    Plan: Okay for discharge today on Xarelto, amiodarone, Toprol and  colchicine.  Heart rate somewhat bradycardic so we'll decrease his Toprol to 12.5 mg  daily.  Patient will followup with Dr.  Shirlee Latch  LOS: 4 days    Cassell Clement 05/14/2013, 10:22 AM

## 2013-05-14 NOTE — Progress Notes (Signed)
   CARE MANAGEMENT NOTE 05/14/2013  Patient:  Nathaniel [name], Nathaniel [name]   Account Number:  [id number]  Date Initiated:  05/11/2013  Documentation initiated by:  AMERSON,JULIE  Subjective/Objective Assessment:   PT ADM ON 05/10/13 WITH CHEST PAIN.  PTA, PT RESIDES AT [facility].     Action/Plan:   WILL FOLLOW FOR HOME NEEDS AS PT PROGRESSES.   Anticipated DC Date:  05/12/2013   Anticipated DC Plan:  HOME/SELF CARE      DC Planning Services  CM consult      Choice offered to / List presented to:             Status of service:  Completed, signed off Medicare Important Message given?   (If response is "NO", the following Medicare IM given date fields will be blank) Date Medicare IM given:   Date Additional Medicare IM given:    Discharge Disposition:  HOME/SELF CARE  Per UR Regulation:  Reviewed for med. necessity/level of care/duration of stay  If discussed at Long Length of Stay Meetings, dates discussed:    Comments:  05/14/1414:00 CM spoke with pt in room and gave him a 30 day free trail Xarelto card which he states he will activate in the morning by phone.  No other CM needs were communicated. MD wrote prescription for 30 days and no refills.  Freddy Jaksch, BSN, CM [phone].

## 2013-05-14 NOTE — Discharge Summary (Signed)
Physician Discharge Summary  Nathaniel [name]:[id number] DOB: [birth year] DOA: 05/10/2013  PCP: No primary provider on file.  Admit date: 05/10/2013 Discharge date: 05/14/2013  Time spent: 35 minutes  Recommendations for Outpatient Follow-up:  1. Follow up with PCP in 1-2 weeks 2. Follow up with Dr. Shirlee Latch  Discharge Diagnoses:  Afib RVR Questionable Pericarditis  Principal Problem:   Chest pain Active Problems:   HTN (hypertension)   Atrial fibrillation  Discharge Condition: improved  Diet recommendation: Regular  Filed Weights   05/11/13 0433  Weight: 73.2 kg (161 lb 6 oz)   History of present illness:  Nathaniel [name] is a 79 y.o. male who presents to the ED with chest pain. Pain is dull in quality, located across his anterior chest, initially had an episode at 5pm while walking today for which he took 2 ASA which helped. The pain returned at 10pm while he was sitting down watching TV, pain got progressively worse so his ALF nurse saw him and called EMS. 2 NTG resolved his chest pain completely (and gave him a headache), and he was taken to the ED.  In the ED, EKG shows diffuse ST segment elevation, no reciprocal changes, remainder of work up negative, patient being admitted as CP r/o cardiac.  Hospital Course:  The patient was admitted to the floor. The patient continued to complain of mild intermittent chest pains improved with NTG. Cardiology was consulted and the patient was initially planned for a stress test the following day. That evening, the patient was noted to develop symptomatic afib w/ RVR and was subsequently transferred to stepdown for better rate control. He was started on a heparin gtt and amiodarone. The patient's rhythm returned to NSR and he was started on Toprol XL and transitioned to Xarelto. There were also subtle EKG changes suggestive of possible pericarditis. The patient was therefore started on colchicine. He otherwise remained medically stable for  close outpatient follow up.  Consultations:  Cardiology  Discharge Exam: Filed Vitals:   05/13/13 1315 05/13/13 1420 05/13/13 2030 05/14/13 0527  BP: 153/62 143/53 145/57 126/68  Pulse: 64 60 57 56  Temp:  97.6 F (36.4 C) 97.7 F (36.5 C) 98.7 F (37.1 C)  TempSrc:  Oral  Oral  Resp:  18 16 16   Height:      Weight:      SpO2:  99% 98% 95%   General: Awake, in nad Cardiovascular: regular, s1, s2 Respiratory: normal resp effort, no wheezing  Discharge Instructions    Medication List    STOP taking these medications       aspirin 325 MG tablet      TAKE these medications       amiodarone 200 MG tablet  Commonly known as:  PACERONE  Take 1 tablet (200 mg total) by mouth 2 (two) times daily.     colchicine 0.6 MG tablet  Take 1 tablet (0.6 mg total) by mouth 2 (two) times daily.     finasteride 5 MG tablet  Commonly known as:  PROSCAR  Take 5 mg by mouth every other day. Dose is taken at bedtime     hydrochlorothiazide 12.5 MG tablet  Commonly known as:  HYDRODIURIL  Take 12.5 mg by mouth daily.     levothyroxine 88 MCG tablet  Commonly known as:  SYNTHROID, LEVOTHROID  Take 88 mcg by mouth daily before breakfast.     metoprolol succinate 12.5 mg Tb24 24 hr tablet  Commonly known as:  TOPROL-XL  Take 0.5 tablets (12.5 mg total) by mouth daily.  Start taking on:  05/15/2013     multivitamins with iron Tabs tablet  Take 1 tablet by mouth daily.     potassium chloride SA 20 MEQ tablet  Commonly known as:  K-DUR,KLOR-CON  Take 1 tablet (20 mEq total) by mouth daily.     Rivaroxaban 15 MG Tabs tablet  Commonly known as:  XARELTO  Take 1 tablet (15 mg total) by mouth daily with supper.       Allergies  Allergen Reactions  . Cinnamon Itching  . Percocet [Oxycodone-Acetaminophen] Other (See Comments)    confusion  . Protonix [Pantoprazole Sodium] Diarrhea and Nausea And Vomiting   Follow-up Information   Schedule an appointment as soon as possible  for a visit with Follow up with our PCP in 1-2 weeks.      Follow up with Marca Ancona, MD. (You will be notified by Dr. Alford Highland office)    Specialty:  Cardiology   Contact information:   [address] [place] [address] [phone]        The results of significant diagnostics from this hospitalization (including imaging, microbiology, ancillary and laboratory) are listed below for reference.    Significant Diagnostic Studies: Dg Chest 2 View  05/11/2013   *RADIOLOGY REPORT*  Clinical Data: Chest pain, shortness of breath  CHEST - 2 VIEW  Comparison: Prior radiograph from 10/05/2005.  Findings: Cardiac and mediastinal silhouettes are stable in size and contour, and remain within normal limits.  The lungs are normally inflated.  Mild linear opacities at the left lung base most likely reflect atelectasis or scarring.  No pulmonary edema or pleural effusion is identified.  Curvilinear opacity within the left perihilar region is also most consistent with atelectasis.  No focal infiltrate identified.  No pneumothorax.  No acute osseous abnormality identified.  IMPRESSION: Left basilar and left perihilar atelectasis.  No acute cardiopulmonary process.   Original Report Authenticated By: Rise Mu, M.D.    Microbiology: Recent Results (from the past 240 hour(s))  MRSA PCR SCREENING     Status: None   Collection Time    05/12/13  2:20 AM      Result Value Range Status   MRSA by PCR NEGATIVE  NEGATIVE Final   Comment:            The GeneXpert MRSA Assay (FDA     approved for NASAL specimens     only), is one component of a     comprehensive MRSA colonization     surveillance program. It is not     intended to diagnose MRSA     infection nor to guide or     monitor treatment for     MRSA infections.     Labs: Basic Metabolic Panel:  Recent Labs Lab 05/11/13 0030 05/12/13 1230 05/13/13 1210 05/14/13 0505  NA 140 137 135 135  K 4.2 3.7 3.4* 3.4*   CL 103 99 101 100  CO2 29 25 25 25   GLUCOSE 128* 111* 101* 117*  BUN 20 22 17 14   CREATININE 0.87 1.06 0.71 0.70  CALCIUM 8.8 8.6 8.8 8.5  MG  --  2.1  --   --    Liver Function Tests:  Recent Labs Lab 05/14/13 0505  AST 20  ALT 23  ALKPHOS 87  BILITOT 0.8  PROT 6.0  ALBUMIN 2.6*   No results found for this basename: LIPASE, AMYLASE,  in the  last 168 hours No results found for this basename: AMMONIA,  in the last 168 hours CBC:  Recent Labs Lab 05/11/13 0030 05/13/13 0540 05/14/13 0505  WBC 8.3 8.4 7.0  HGB 13.6 12.4* 12.7*  HCT 39.7 37.0* 37.2*  MCV 95.2 94.4 93.9  PLT 186 160 174   Cardiac Enzymes:  Recent Labs Lab 05/11/13 1210 05/11/13 1756 05/11/13 2311  TROPONINI <0.30 <0.30 <0.30   BNP: BNP (last 3 results) No results found for this basename: PROBNP,  in the last 8760 hours CBG: No results found for this basename: GLUCAP,  in the last 168 hours  Signed:  Haylyn Halberg K  [facility] 05/14/2013, 10:45 AM

## 2013-05-21 ENCOUNTER — Observation Stay (HOSPITAL_COMMUNITY)
Admission: EM | Admit: 2013-05-21 | Discharge: 2013-05-23 | Disposition: A | Discharge status: Home / Self Care | Payer: Medicare Other | Attending: Internal Medicine | Admitting: Internal Medicine

## 2013-05-21 ENCOUNTER — Encounter (HOSPITAL_COMMUNITY): Payer: Self-pay

## 2013-05-21 DIAGNOSIS — R197 Diarrhea, unspecified: Secondary | ICD-10-CM | POA: Diagnosis not present

## 2013-05-21 DIAGNOSIS — E039 Hypothyroidism, unspecified: Secondary | ICD-10-CM | POA: Diagnosis not present

## 2013-05-21 DIAGNOSIS — Z79899 Other long term (current) drug therapy: Secondary | ICD-10-CM | POA: Insufficient documentation

## 2013-05-21 DIAGNOSIS — R55 Syncope and collapse: Secondary | ICD-10-CM | POA: Diagnosis not present

## 2013-05-21 DIAGNOSIS — I498 Other specified cardiac arrhythmias: Secondary | ICD-10-CM | POA: Diagnosis not present

## 2013-05-21 DIAGNOSIS — G40909 Epilepsy, unspecified, not intractable, without status epilepticus: Secondary | ICD-10-CM | POA: Diagnosis not present

## 2013-05-21 DIAGNOSIS — Z7901 Long term (current) use of anticoagulants: Secondary | ICD-10-CM | POA: Insufficient documentation

## 2013-05-21 DIAGNOSIS — I4891 Unspecified atrial fibrillation: Secondary | ICD-10-CM | POA: Diagnosis not present

## 2013-05-21 DIAGNOSIS — I10 Essential (primary) hypertension: Secondary | ICD-10-CM | POA: Insufficient documentation

## 2013-05-21 DIAGNOSIS — I313 Pericardial effusion (noninflammatory): Secondary | ICD-10-CM

## 2013-05-21 DIAGNOSIS — I951 Orthostatic hypotension: Principal | ICD-10-CM | POA: Insufficient documentation

## 2013-05-21 DIAGNOSIS — R001 Bradycardia, unspecified: Secondary | ICD-10-CM

## 2013-05-21 LAB — CBC
HCT: 36.5 % — ABNORMAL LOW (ref 39.0–52.0)
MCH: 31.3 pg (ref 26.0–34.0)
MCV: 94.3 fL (ref 78.0–100.0)
Platelets: 264 10*3/uL (ref 150–400)
RDW: 13.3 % (ref 11.5–15.5)

## 2013-05-21 LAB — BASIC METABOLIC PANEL
BUN: 11 mg/dL (ref 6–23)
CO2: 25 mEq/L (ref 19–32)
Calcium: 8.8 mg/dL (ref 8.4–10.5)
Creatinine, Ser: 0.83 mg/dL (ref 0.50–1.35)
GFR calc Af Amer: 87 mL/min — ABNORMAL LOW (ref >90)

## 2013-05-21 LAB — GLUCOSE, CAPILLARY: Glucose-Capillary: 100 mg/dL — ABNORMAL HIGH (ref 70–99)

## 2013-05-21 MED ORDER — SODIUM CHLORIDE 0.9 % IV BOLUS (SEPSIS)
500.0000 mL | Freq: Once | INTRAVENOUS | Status: AC
  Administered 2013-05-21: 500 mL via INTRAVENOUS

## 2013-05-21 NOTE — ED Notes (Signed)
Per PTAR: Pt from [facility], with complaints of sudden onset of lightheadedness at 1700 while standing. PT reports near syncope. Neuro intact. 170/80. Pt reports mild improvement. Recently dx A fib.  100% RA.

## 2013-05-21 NOTE — ED Notes (Signed)
CBG was 100 

## 2013-05-21 NOTE — ED Notes (Signed)
Old and new EKG given to Dr Criss Alvine

## 2013-05-21 NOTE — ED Notes (Signed)
Pt recently dx with A fib. Nurse at living center called his cardiologist Dr. Shirlee Latch when he experienced his near syncope, who recommended pt to follow up to with ED. Pt denies CP, palpitations.

## 2013-05-21 NOTE — ED Provider Notes (Signed)
CSN: [id number]     Arrival date & time 05/21/13  1919 History   First MD Initiated Contact with Patient 05/21/13 1955     Chief Complaint  Patient presents with  . Near Syncope   (Consider location/radiation/quality/duration/timing/severity/associated sxs/prior Treatment) HPI Comments: 79 year old male with recent diagnosis of A. fib presents with near syncope today. Shortly before arrival he was walking around her normal and had an acute onset of lightheadedness and maybe nearly passed out. He did not fall and was able to grab onto the refrigerator until he is able to settle down. If any headaches, focal weakness, shortness of breath, chest pain, nausea or vomiting. Was not ataxic. He denies any palpitations. He has had several loose stools a day (up to 3) without vomiting. Denies any blood in his stools.  The history is provided by the patient and a relative.    Past Medical History  Diagnosis Date  . Thyroid disease   . Hypercholesteremia   . HTN (hypertension)   . Hypothyroidism    Past Surgical History  Procedure Laterality Date  . Shoulder surgery Right 2013    rotator cuff   . Knee surgery Left 73 years ago  . Ankle fracture surgery Left 81 years ago  . Hernia repair  28 years ago   History reviewed. No pertinent family history. History  Substance Use Topics  . Smoking status: Never Smoker   . Smokeless tobacco: Not on file  . Alcohol Use: 0.6 oz/week    1 Glasses of wine per week     Comment: 1 glass of wine    Review of Systems  Constitutional: Negative for fever and chills.  Respiratory: Negative for shortness of breath.   Cardiovascular: Negative for chest pain and palpitations.  Gastrointestinal: Negative for nausea, vomiting and abdominal pain.  Genitourinary: Negative for dysuria.  Neurological: Positive for weakness (generalized) and light-headedness. Negative for headaches.  All other systems reviewed and are negative.    Allergies  Cinnamon;  Percocet; and Protonix  Home Medications   Current Outpatient Rx  Name  Route  Sig  Dispense  Refill  . amiodarone (PACERONE) 200 MG tablet   Oral   Take 1 tablet (200 mg total) by mouth 2 (two) times daily.   60 tablet   0   . colchicine 0.6 MG tablet   Oral   Take 0.6 mg by mouth daily.         . finasteride (PROSCAR) 5 MG tablet   Oral   Take 5 mg by mouth every other day. Dose is taken at bedtime         . hydrochlorothiazide (HYDRODIURIL) 12.5 MG tablet   Oral   Take 12.5 mg by mouth daily.          Marland Kitchen levothyroxine (SYNTHROID, LEVOTHROID) 88 MCG tablet   Oral   Take 88 mcg by mouth daily before breakfast.          . metoprolol succinate (TOPROL-XL) 12.5 mg TB24 24 hr tablet   Oral   Take 0.5 tablets (12.5 mg total) by mouth daily.   30 tablet   0   . Multiple Vitamins-Iron (MULTIVITAMINS WITH IRON) TABS tablet   Oral   Take 1 tablet by mouth daily.         . potassium chloride SA (K-DUR,KLOR-CON) 20 MEQ tablet   Oral   Take 1 tablet (20 mEq total) by mouth daily.   30 tablet   0   . Rivaroxaban (  XARELTO) 15 MG TABS tablet   Oral   Take 1 tablet (15 mg total) by mouth daily with supper.   30 tablet   0    BP 150/75  Pulse 62  Temp(Src) 97.6 F (36.4 C) (Oral)  Resp 19  SpO2 97% Physical Exam  Nursing note and vitals reviewed. Constitutional: He is oriented to person, place, and time. He appears well-developed and well-nourished.  HENT:  Head: Normocephalic and atraumatic.  Right Ear: External ear normal.  Left Ear: External ear normal.  Nose: Nose normal.  Eyes: EOM are normal. Pupils are equal, round, and reactive to light. Right eye exhibits no discharge. Left eye exhibits no discharge.  Neck: Neck supple.  Cardiovascular: Regular rhythm, normal heart sounds and intact distal pulses.  Bradycardia present.   Pulmonary/Chest: Effort normal and breath sounds normal.  Abdominal: Soft. There is no tenderness.  Musculoskeletal: He  exhibits no edema.  Neurological: He is alert and oriented to person, place, and time. He has normal strength. No cranial nerve deficit or sensory deficit. He exhibits normal muscle tone. Coordination normal. GCS eye subscore is 4. GCS verbal subscore is 5. GCS motor subscore is 6.  Skin: Skin is warm and dry.    ED Course  Procedures (including critical care time) Labs Review Labs Reviewed  CBC - Abnormal; Notable for the following:    RBC 3.87 (*)    Hemoglobin 12.1 (*)    HCT 36.5 (*)    All other components within normal limits  BASIC METABOLIC PANEL - Abnormal; Notable for the following:    GFR calc non Af Amer 75 (*)    GFR calc Af Amer 87 (*)    All other components within normal limits  GLUCOSE, CAPILLARY - Abnormal; Notable for the following:    Glucose-Capillary 100 (*)    All other components within normal limits  POCT I-STAT TROPONIN I    Date: 05/21/2013  Rate: 54  Rhythm: sinus bradycardia  QRS Axis: left  Intervals: normal  ST/T Wave abnormalities: nonspecific ST/T changes  Conduction Disutrbances:nonspecific intraventricular conduction delay  Narrative Interpretation:   Old EKG Reviewed: unchanged   Imaging Review No results found.  MDM   1. Near syncope   2. Bradycardia    Patient with near syncope tonight. Due to his recent diarrhea are slightly mild dehydration component. However patient not just the hip and walk around for several minutes before the symptoms started. Due to this and his recent diagnosis of A. fib with a possible tachybradycardia or other arrhythmia causing his symptoms. He is currently asymptomatic and appears well. Due to his age and comorbidities will put in to observation with the hospitalist on telemetry.    Audree Camel, MD 05/22/13 [phone]

## 2013-05-22 ENCOUNTER — Observation Stay (HOSPITAL_COMMUNITY): Payer: Medicare Other

## 2013-05-22 ENCOUNTER — Encounter (HOSPITAL_COMMUNITY): Payer: Self-pay | Admitting: Internal Medicine

## 2013-05-22 DIAGNOSIS — I951 Orthostatic hypotension: Secondary | ICD-10-CM | POA: Diagnosis present

## 2013-05-22 DIAGNOSIS — R197 Diarrhea, unspecified: Secondary | ICD-10-CM

## 2013-05-22 DIAGNOSIS — R0989 Other specified symptoms and signs involving the circulatory and respiratory systems: Secondary | ICD-10-CM | POA: Diagnosis not present

## 2013-05-22 DIAGNOSIS — I319 Disease of pericardium, unspecified: Secondary | ICD-10-CM

## 2013-05-22 DIAGNOSIS — R55 Syncope and collapse: Secondary | ICD-10-CM

## 2013-05-22 DIAGNOSIS — Z7901 Long term (current) use of anticoagulants: Secondary | ICD-10-CM | POA: Diagnosis not present

## 2013-05-22 DIAGNOSIS — I4891 Unspecified atrial fibrillation: Secondary | ICD-10-CM | POA: Diagnosis not present

## 2013-05-22 DIAGNOSIS — J90 Pleural effusion, not elsewhere classified: Secondary | ICD-10-CM | POA: Diagnosis not present

## 2013-05-22 LAB — CBC WITH DIFFERENTIAL/PLATELET
Basophils Absolute: 0 10*3/uL (ref 0.0–0.1)
Basophils Relative: 1 % (ref 0–1)
Eosinophils Relative: 3 % (ref 0–5)
HCT: 34.9 % — ABNORMAL LOW (ref 39.0–52.0)
Hemoglobin: 11.6 g/dL — ABNORMAL LOW (ref 13.0–17.0)
MCH: 31.4 pg (ref 26.0–34.0)
MCHC: 33.2 g/dL (ref 30.0–36.0)
MCV: 94.3 fL (ref 78.0–100.0)
Monocytes Absolute: 0.7 10*3/uL (ref 0.1–1.0)
Monocytes Relative: 12 % (ref 3–12)
Neutro Abs: 4.2 10*3/uL (ref 1.7–7.7)
RDW: 13.4 % (ref 11.5–15.5)

## 2013-05-22 LAB — HEPATIC FUNCTION PANEL
AST: 15 U/L (ref 0–37)
Bilirubin, Direct: <0.1 mg/dL (ref 0.0–0.3)
Total Bilirubin: 0.2 mg/dL — ABNORMAL LOW (ref 0.3–1.2)

## 2013-05-22 LAB — BASIC METABOLIC PANEL
BUN: 12 mg/dL (ref 6–23)
CO2: 25 mEq/L (ref 19–32)
Chloride: 105 mEq/L (ref 96–112)
Creatinine, Ser: 0.84 mg/dL (ref 0.50–1.35)
GFR calc Af Amer: 86 mL/min — ABNORMAL LOW (ref >90)
Glucose, Bld: 99 mg/dL (ref 70–99)
Potassium: 3.7 mEq/L (ref 3.5–5.1)

## 2013-05-22 MED ORDER — METOPROLOL SUCCINATE 12.5 MG HALF TABLET
12.5000 mg | ORAL_TABLET | Freq: Every day | ORAL | Status: DC
  Administered 2013-05-23: 12.5 mg via ORAL
  Filled 2013-05-22 (×2): qty 1

## 2013-05-22 MED ORDER — ONDANSETRON HCL 4 MG PO TABS: 4.0000 mg | ORAL_TABLET | Freq: Four times a day (QID) | ORAL | Status: DC | PRN

## 2013-05-22 MED ORDER — SODIUM CHLORIDE 0.9 % IJ SOLN
3.0000 mL | Freq: Two times a day (BID) | INTRAMUSCULAR | Status: DC
  Administered 2013-05-22 – 2013-05-23 (×3): 3 mL via INTRAVENOUS

## 2013-05-22 MED ORDER — SODIUM CHLORIDE 0.9 % IV SOLN
INTRAVENOUS | Status: AC
  Administered 2013-05-22: 02:00:00 via INTRAVENOUS

## 2013-05-22 MED ORDER — ONDANSETRON HCL 4 MG/2ML IJ SOLN: 4.0000 mg | Freq: Four times a day (QID) | INTRAMUSCULAR | Status: DC | PRN

## 2013-05-22 MED ORDER — LEVOTHYROXINE SODIUM 88 MCG PO TABS
88.0000 ug | ORAL_TABLET | Freq: Every day | ORAL | Status: DC
  Administered 2013-05-22 – 2013-05-23 (×2): 88 ug via ORAL
  Filled 2013-05-22 (×4): qty 1

## 2013-05-22 MED ORDER — ACETAMINOPHEN 650 MG RE SUPP: 650.0000 mg | Freq: Four times a day (QID) | RECTAL | Status: DC | PRN

## 2013-05-22 MED ORDER — POTASSIUM CHLORIDE CRYS ER 20 MEQ PO TBCR
20.0000 meq | EXTENDED_RELEASE_TABLET | Freq: Every day | ORAL | Status: DC
  Administered 2013-05-22 – 2013-05-23 (×2): 20 meq via ORAL
  Filled 2013-05-22 (×2): qty 1

## 2013-05-22 MED ORDER — ACETAMINOPHEN 325 MG PO TABS: 650.0000 mg | ORAL_TABLET | Freq: Four times a day (QID) | ORAL | Status: DC | PRN

## 2013-05-22 MED ORDER — AMIODARONE HCL 200 MG PO TABS
200.0000 mg | ORAL_TABLET | Freq: Two times a day (BID) | ORAL | Status: DC
Start: 2013-05-22 — End: 2013-05-23
  Administered 2013-05-22 – 2013-05-23 (×3): 200 mg via ORAL
  Filled 2013-05-22 (×5): qty 1

## 2013-05-22 MED ORDER — RIVAROXABAN 15 MG PO TABS
15.0000 mg | ORAL_TABLET | Freq: Every day | ORAL | Status: DC
  Administered 2013-05-22: 15 mg via ORAL
  Filled 2013-05-22 (×2): qty 1

## 2013-05-22 MED ORDER — FINASTERIDE 5 MG PO TABS
5.0000 mg | ORAL_TABLET | ORAL | Status: DC
  Administered 2013-05-22: 5 mg via ORAL
  Filled 2013-05-22: qty 1
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
## 2013-05-22 NOTE — Progress Notes (Addendum)
Per lab because stool is formed, then C.diff PCR would be cancelled. Dr. Joseph Art notified and order received to discontinue contact precautions

## 2013-05-22 NOTE — Progress Notes (Signed)
Utilization review completed.  

## 2013-05-22 NOTE — Progress Notes (Signed)
  Echocardiogram 2D Echocardiogram has been performed.  Deloras Reichard FRANCES 05/22/2013, 6:35 PM

## 2013-05-22 NOTE — H&P (Signed)
[facility] History and Physical  Nathaniel [name] KGM:[id number] DOB: 1920-03-30 DOA: 05/21/2013  Referring physician: ER physician. PCP: Mickie Hillier, MD  Specialists: [facility] cardiology.  Chief Complaint: Almost loss consciousness.  HPI: Nathaniel [name] is a 79 y.o. male who was recently admitted for chest pain and was found to be possible pericarditis and at that time patient was diagnosed with new onset atrial fibrillation and placed on xarelto and for rate control amiodarone and Toprol and for possible pericarditis patient is on colchicine presents with complaints of having had a spell of where patient fell he was about to lose consciousness. Patient's symptoms gradually improved by the time he came to the ER. In the ER he was found to be mildly orthostatic. In addition EKG also was showing mild sinus bradycardia. Patient states that over the last 2-3 days he has been having some diarrhea. Yesterday morning he had 2 episodes of clear diarrhea large amount. Denies any abdominal pain nausea vomiting. Later in the day he did well and was actually planning to go for dinner with family when he developed a dizzy spell. Patient denies any chest pain palpitations shortness of breath. Patient has been admitted for further observation.  Review of Systems: As presented in the history of presenting illness, rest negative.  Past Medical History  Diagnosis Date  . Thyroid disease   . Hypercholesteremia   . HTN (hypertension)   . Hypothyroidism    Past Surgical History  Procedure Laterality Date  . Shoulder surgery Right 2013    rotator cuff   . Knee surgery Left 73 years ago  . Ankle fracture surgery Left 79 years ago  . Hernia repair  28 years ago   Social History:  reports that he has never smoked. He does not have any smokeless tobacco history on file. He reports that he drinks about 0.6 ounces of alcohol per week. He reports that he does not use illicit drugs. Home. where  does patient live-- Can do ADLs. Can patient participate in ADLs?  Allergies  Allergen Reactions  . Cinnamon Itching  . Percocet [Oxycodone-Acetaminophen] Other (See Comments)    confusion  . Protonix [Pantoprazole Sodium] Diarrhea and Nausea And Vomiting    History reviewed. No pertinent family history.    Prior to Admission medications   Medication Sig Start Date End Date Taking? Authorizing Provider  amiodarone (PACERONE) 200 MG tablet Take 1 tablet (200 mg total) by mouth 2 (two) times daily. 05/14/13  Yes Jerald Kief, MD  colchicine 0.6 MG tablet Take 0.6 mg by mouth daily.   Yes Historical Provider, MD  finasteride (PROSCAR) 5 MG tablet Take 5 mg by mouth every other day. Dose is taken at bedtime   Yes Historical Provider, MD  hydrochlorothiazide (HYDRODIURIL) 12.5 MG tablet Take 12.5 mg by mouth daily.  02/28/13  Yes Historical Provider, MD  levothyroxine (SYNTHROID, LEVOTHROID) 88 MCG tablet Take 88 mcg by mouth daily before breakfast.  05/04/13  Yes Historical Provider, MD  metoprolol succinate (TOPROL-XL) 12.5 mg TB24 24 hr tablet Take 0.5 tablets (12.5 mg total) by mouth daily. 05/15/13  Yes Jerald Kief, MD  Multiple Vitamins-Iron (MULTIVITAMINS WITH IRON) TABS tablet Take 1 tablet by mouth daily.   Yes Historical Provider, MD  potassium chloride SA (K-DUR,KLOR-CON) 20 MEQ tablet Take 1 tablet (20 mEq total) by mouth daily. 05/14/13  Yes Jerald Kief, MD  Rivaroxaban (XARELTO) 15 MG TABS tablet Take 1 tablet (15 mg total) by mouth daily with  supper. 05/14/13  Yes Jerald Kief, MD   Physical Exam: Filed Vitals:   05/21/13 2315 05/21/13 2345 05/22/13 0015 05/22/13 0045  BP: 123/52 121/48 122/48 141/52  Pulse: 56 58 52 53  Temp:      TempSrc:      Resp: 18 21 17 14   SpO2: 96% 96% 97% 97%     General:  Well-developed and nourished.  Eyes: Anicteric no pallor.  ENT: No discharge from ears eyes nose mouth.  Neck: No mass felt.  Cardiovascular: S1-S2  heard.  Respiratory: No rhonchi or crepitations.  Abdomen: Soft nontender bowel sounds present.  Skin: No rash.  Musculoskeletal: No edema.  Psychiatric: Appears normal.  Neurologic: Alert awake oriented to time place and person. Moves all extremities.  Labs on Admission:  Basic Metabolic Panel:  Recent Labs Lab 05/21/13 2032  NA 137  K 3.5  CL 101  CO2 25  GLUCOSE 99  BUN 11  CREATININE 0.83  CALCIUM 8.8   Liver Function Tests: No results found for this basename: AST, ALT, ALKPHOS, BILITOT, PROT, ALBUMIN,  in the last 168 hours No results found for this basename: LIPASE, AMYLASE,  in the last 168 hours No results found for this basename: AMMONIA,  in the last 168 hours CBC:  Recent Labs Lab 05/21/13 2032  WBC 7.6  HGB 12.1*  HCT 36.5*  MCV 94.3  PLT 264   Cardiac Enzymes: No results found for this basename: CKTOTAL, CKMB, CKMBINDEX, TROPONINI,  in the last 168 hours  BNP (last 3 results) No results found for this basename: PROBNP,  in the last 8760 hours CBG:  Recent Labs Lab 05/21/13 2010  GLUCAP 100*    Radiological Exams on Admission: No results found.  EKG: Independently reviewed. Sinus bradycardia. Heart rate around 54 beats per minute.  Assessment/Plan Principal Problem:   Near syncope Active Problems:   HTN (hypertension)   Atrial fibrillation   Diarrhea   1. Near-syncope - the differentials include being orthostatic with a from his recent diarrhea and also on admission patient was found to be mildly bradycardic. At this time we'll continue gentle hydration and closely monitor in telemetry for any further worsening of his heart rate. Presently is on small dose of metoprolol which I have not discontinued. Patient is also on amiodarone. Check orthostatics in a.m. 2. Diarrhea - patient is on colchicine for possible pericarditis. For now I am holding off colchicine due to diarrhea. May discuss with cardiologist about further need for  colchicine in a.m. Check stool for C. difficile and cultures. 3. Atrial fibrillation - on xarelto and metoprolol and amiodarone. See #1. 4. Hypothyroidism - continue Synthroid. Patient had TSH checked last week.    Code Status: Full code.  Family Communication: None.  Disposition Plan: Admit for observation.    Blenda Wisecup N. [facility] Pager [phone].  If 7PM-7AM, please contact night-coverage [email or web address] Password [facility] 05/22/2013, 12:56 AM

## 2013-05-23 DIAGNOSIS — I951 Orthostatic hypotension: Secondary | ICD-10-CM | POA: Diagnosis not present

## 2013-05-23 DIAGNOSIS — I319 Disease of pericardium, unspecified: Secondary | ICD-10-CM

## 2013-05-23 DIAGNOSIS — I313 Pericardial effusion (noninflammatory): Secondary | ICD-10-CM

## 2013-05-23 DIAGNOSIS — I4891 Unspecified atrial fibrillation: Secondary | ICD-10-CM | POA: Diagnosis not present

## 2013-05-23 DIAGNOSIS — R197 Diarrhea, unspecified: Secondary | ICD-10-CM | POA: Diagnosis not present

## 2013-05-23 DIAGNOSIS — R55 Syncope and collapse: Secondary | ICD-10-CM | POA: Diagnosis not present

## 2013-05-23 MED ORDER — FAMOTIDINE 20 MG PO TABS: 20.0000 mg | ORAL_TABLET | Freq: Two times a day (BID) | ORAL | Status: DC

## 2013-05-23 MED ORDER — COLCHICINE 0.6 MG PO TABS
0.6000 mg | ORAL_TABLET | Freq: Two times a day (BID) | ORAL | Status: DC
  Administered 2013-05-23: 0.6 mg via ORAL
  Filled 2013-05-23 (×2): qty 1

## 2013-05-23 MED ORDER — IBUPROFEN 400 MG PO TABS
400.0000 mg | ORAL_TABLET | Freq: Three times a day (TID) | ORAL | Status: DC
  Administered 2013-05-23: 400 mg via ORAL
  Filled 2013-05-23 (×2): qty 1

## 2013-05-23 MED ORDER — COLCHICINE 0.6 MG PO TABS: 0.6000 mg | ORAL_TABLET | Freq: Two times a day (BID) | ORAL | Status: DC

## 2013-05-23 MED ORDER — FAMOTIDINE 20 MG PO TABS
20.0000 mg | ORAL_TABLET | Freq: Two times a day (BID) | ORAL | Status: DC
  Administered 2013-05-23: 20 mg via ORAL
  Filled 2013-05-23 (×2): qty 1

## 2013-05-23 MED ORDER — IBUPROFEN 400 MG PO TABS: 400.0000 mg | ORAL_TABLET | Freq: Two times a day (BID) | ORAL | Status: DC

## 2013-05-23 MED ORDER — IBUPROFEN 400 MG PO TABS: 400.0000 mg | ORAL_TABLET | Freq: Three times a day (TID) | ORAL | Status: DC

## 2013-05-23 NOTE — Consult Note (Signed)
CARDIOLOGY CONSULT NOTE   Patient ID: Nathaniel [name] MRN: [id number] DOB/AGE: 77/30/1921 77 y.o.  Admit date: 05/21/2013  Primary Physician   Mickie Hillier, MD Primary Cardiologist   DM Reason for Consultation   Pericardial effusion   HPI: Nathaniel [name] is a 79 y/o male with h/o of HTN, HLD, hypothyroidism, recent onset atrial fibrillation (on Xarelto, amiodarone for rate control), ? pericarditis, and no known history of CAD.  Echo 05/22/2013: Moderate pericardial effusion with mild RA collapse; no RV; EF55-60% collapse; IVC mildly dilated but collapses with inspiration.  Nathaniel [name] is a very pleasant 79 y/o wm who presented to the ED on 9/14 after a pre-syncopal episode prior to dinner.  He states that he was walking into his kitchen to get to his medications when he felt lightheaded and weak in the legs. He was able to grab onto the refrigerator to catch himself as he went down to the floor. He denies losing consciousness, but states that it took about 30 " for him to start feeling ok to stand up and move. He denies any CP, SOB, N/V, diaphoresis, fever or chills. He also denies any previous pre-syncopal or syncopal episodes.  He states that he had loose stools and diarrhea for the 3 days prior to 9/14, but that his appetite was normal and he was getting ready to go to dinner when this occurred. He has a walker, but he only uses it when he leaves his home.   Nathaniel [name] was recently admitted to the hospital on 9/3 when he arrived in the ED with CP and an abnormal ECG.  He was found to be in atrial fibrillation w/ RVR, which later converted to NSR with rate control medications.  He was also treated for acute pericarditis given his physical exam and diffuse ST changes on ECG.  He saw Dr. Shirlee Latch in the hospital and was d/c on Xarelto, amiodarone 200mg  BID, and colchicine 0.6mg  QD.  His Metoprolol was decreased to 12.5 mg QD as he was bradycardic.  Mr. Para reports that he did not have any  CP after admission to the hospital on 9/4 or since d/c.   For this current hospital admission, Nathaniel [name] colchicine was d/c as it was thought to contribute to his diarrhea.  He also had positive orthostatics and was being prepared for d/c pending echo results.  TTE on 9/15 revealed a moderate pericardial effusion, prompting cardiology consult.  He states that he currently feels completely normal and has no complaints. He denies any CP, tachycardia, palpitations, SOB, N/V, diaphoresis, abdominal pain, diarrhea, fever, chills, increased swelling or bleeding. He denies ever having a cardiology workup, and is supposed to f/u w/ Dr. Shirlee Latch for his last admission.    Past Medical History  Diagnosis Date  . Thyroid disease   . Hypercholesteremia   . HTN (hypertension)   . Hypothyroidism      Past Surgical History  Procedure Laterality Date  . Shoulder surgery Right 2013    rotator cuff   . Knee surgery Left 73 years ago  . Ankle fracture surgery Left 79 years ago  . Hernia repair  28 years ago    Allergies  Allergen Reactions  . Cinnamon Itching  . Percocet [Oxycodone-Acetaminophen] Other (See Comments)    confusion  . Protonix [Pantoprazole Sodium] Diarrhea and Nausea And Vomiting    I have reviewed the patient's current medications . amiodarone  200 mg Oral BID  . finasteride  5 mg Oral QODAY  .  levothyroxine  88 mcg Oral QAC breakfast  . metoprolol succinate  12.5 mg Oral Daily  . potassium chloride SA  20 mEq Oral Daily  . Rivaroxaban  15 mg Oral Q supper  . sodium chloride  3 mL Intravenous Q12H     acetaminophen, acetaminophen, ondansetron (ZOFRAN) IV, ondansetron  Prior to Admission medications   Medication Sig Start Date End Date Taking? Authorizing Provider  amiodarone (PACERONE) 200 MG tablet Take 1 tablet (200 mg total) by mouth 2 (two) times daily. 05/14/13  Yes Jerald Kief, MD  colchicine 0.6 MG tablet Take 0.6 mg by mouth daily.   Yes Historical Provider,  MD  finasteride (PROSCAR) 5 MG tablet Take 5 mg by mouth every other day. Dose is taken at bedtime   Yes Historical Provider, MD  hydrochlorothiazide (HYDRODIURIL) 12.5 MG tablet Take 12.5 mg by mouth daily.  02/28/13  Yes Historical Provider, MD  levothyroxine (SYNTHROID, LEVOTHROID) 88 MCG tablet Take 88 mcg by mouth daily before breakfast.  05/04/13  Yes Historical Provider, MD  metoprolol succinate (TOPROL-XL) 12.5 mg TB24 24 hr tablet Take 0.5 tablets (12.5 mg total) by mouth daily. 05/15/13  Yes Jerald Kief, MD  Multiple Vitamins-Iron (MULTIVITAMINS WITH IRON) TABS tablet Take 1 tablet by mouth daily.   Yes Historical Provider, MD  potassium chloride SA (K-DUR,KLOR-CON) 20 MEQ tablet Take 1 tablet (20 mEq total) by mouth daily. 05/14/13  Yes Jerald Kief, MD  Rivaroxaban (XARELTO) 15 MG TABS tablet Take 1 tablet (15 mg total) by mouth daily with supper. 05/14/13  Yes Jerald Kief, MD     History   Social History  . Marital Status: Married    Spouse Name: N/A    Number of Children: N/A  . Years of Education: N/A   Occupational History  . Not on file.   Social History Main Topics  . Smoking status: Never Smoker   . Smokeless tobacco: Not on file  . Alcohol Use: 0.6 oz/week    1 Glasses of wine per week     Comment: 1 glass of wine  . Drug Use: No  . Sexual Activity: Not on file   Other Topics Concern  . Not on file   Social History Narrative  . No narrative on file    No family status information on file.   History reviewed. No pertinent family history.   ROS:  Full 14 point review of systems complete and found to be negative unless listed above.  Physical Exam: Blood pressure 150/65, pulse 55, temperature 97.7 F (36.5 C), temperature source Oral, resp. rate 18, height 6' (1.829 m), weight 176 lb 12.8 oz (80.196 kg), SpO2 95.00%.  General: Well developed, well nourished, male in no acute distress Head: Normocephalic and atraumatic Lungs: CTAB Heart: HRRR S1 S2, no  rub/gallop,no murmur. pulses are 2+ extrem.   Neck: No carotid bruits. No JVD elevation. Abdomen: Bowel sounds present, abdomen soft and non-tender without masses or hernias noted. Msk:  No spine or cva tenderness. No weakness, no joint deformities or effusions. Extremities: No clubbing or cyanosis. trace edema of ankles bilaterally.  Neuro: Alert and oriented X 3. No focal deficits noted. Psych:  Good affect, responds appropriately Skin: No rashes or lesions noted.  Labs:   Lab Results  Component Value Date   WBC 6.5 05/22/2013   HGB 11.6* 05/22/2013   HCT 34.9* 05/22/2013   MCV 94.3 05/22/2013   PLT 256 05/22/2013   No results found for  this basename: INR,  in the last 72 hours  Recent Labs Lab 05/22/13 0535  NA 140  K 3.7  CL 105  CO2 25  BUN 12  CREATININE 0.84  CALCIUM 8.6  PROT 6.0  BILITOT 0.2*  ALKPHOS 109  ALT 17  AST 15  GLUCOSE 99   Magnesium  Date Value Range Status  05/12/2013 2.1  1.5 - 2.5 mg/dL Final    Recent Labs  78/29/56 0215  TROPONINI <0.30    Recent Labs  05/21/13 2107  TROPIPOC 0.01   TSH  Date/Time Value Range Status  05/12/2013 12:30 PM 0.986  0.350 - 4.500 uIU/mL Final     Performed at [facility]   Echo:  05/22/2013: - Left ventricle: The cavity size was normal. Wall thickness was increased in a pattern of mild LVH. Systolic function was normal. The estimated ejection fraction was in the range of 55% to 60%. Wall motion was normal; there were no regional wall motion abnormalities. Doppler parameters are consistent with abnormal left ventricular relaxation (grade 1 diastolic dysfunction). - Aortic valve: Mild regurgitation. - Left atrium: The atrium was moderately dilated. - Right ventricle: The cavity size was mildly dilated. - Right atrium: The atrium was mildly dilated. - Pulmonary arteries: Systolic pressure was mildly increased. PA peak pressure: 49mm Hg (S). - Pericardium, extracardiac: A moderate pericardial  effusion was identified. There was mildright atrial chamber collapse. Impressions: - Moderate pericardial effusion with mild RA collapse; no RV collapse; IVC mildly dilated but collapses with inspiration.  ECG:  05/22/2013: Sinus bradycardia, no ST/T changes Vent. rate 57 BPM PR interval 182 ms QRS duration 122 ms QT/QTc 450/438 ms P-R-T axes 42 -14 54  Radiology:  Dg Chest Port 1 View  05/22/2013   *RADIOLOGY REPORT*  Clinical Data: Evaluate cardiac silhouette  PORTABLE CHEST - 1 VIEW  Comparison: 05/11/2013  Findings: Apparent enlargement cardiac silhouette.  The lungs appear hyperexpanded with flattening of bilateral hemidiaphragms. There is mild cephalization of flow without frank evidence of edema.  Interval development of a small left-sided pleural effusion with worsening left basilar/retrocardiac opacities.  No pneumothorax.  Unchanged bones.  IMPRESSION: 1.  Apparent enlargement of the cardiac silhouette, possibly accentuated due to AP projection though a pericardial effusion may have a similar appearance.  Further evaluation with a cardiac echo may be performed as clinically indicated. 2.  Pulmonary venous congestion without frank evidence of edema. 3.  Interval development of a small left-sided pleural effusion with associated left basilar opacities, likely atelectasis.   Original Report Authenticated By: Tacey Ruiz, MD    ASSESSMENT AND PLAN:    Principal Problem:   Near syncope--most likely do to dehydration secondary to diarrhea. Pt. Had (+) orthostatics.  Recommended for patient to maintain adequate hydration, and to continue to monitor his BP at home.     Pericardial effusion--pt is asymptomatic.  Restart colchicine at BID, watch for S.E. Add NSAIDs, f/u echo as OP.  Xarelto may be a contributing factor, d/c it for now.  Ck ESR, TSH was recently OK.   Active Problems:   HTN (hypertension)-- SBP 150-173, DBP 63-79 for current admission. Pt. Is on HCTZ 12.5 mg QD and  Metoprolol 12.5mg  QD. Would not increase BB since pt. HR is steadily <65. Consider changing HCTZ if pt not able to maintain adequate hydration.     Atrial fibrillation-- Admitted 9/3-7, d/c on Xarelto and Amiodarone 200mg  BID. Continue amiodarone as pt is in NSR.  Orthostatic hypotension --recommended for patient to maintain adequate hydration, to continue to monitor his BP at home, and to take his time when getting up/changing positions. Management per IM.  Will discuss plans with MD.    Signed: Christiana Fuchs, Student-PA 05/23/2013 9:06 AM  Theodore Demark, PA-C 05/23/2013 11:51 AM Beeper [phone]  Patient examined chart reviewed. Echo reviewed. No signs of tamponade and patient clinically stable with no pulsus and no rub.  He is out 2 weeks from conversion and xarelto likely contributing to effusion in setting of pericarditis.  Stop xarelto, try to continue colchicine bid if stool frequency allows.  Add motrin 400 tid with H2 blocker.  Check ESR  If very high can consider steroids but would like to avoid.  F/U in our office with echo in 3 weeks  Charlton Haws
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
## 2013-05-25 ENCOUNTER — Telehealth: Payer: Self-pay | Admitting: *Deleted

## 2013-05-25 ENCOUNTER — Other Ambulatory Visit: Payer: Self-pay

## 2013-05-25 LAB — STOOL CULTURE

## 2013-05-25 NOTE — Telephone Encounter (Signed)
Spoke with patient who states he was feeling well yesterday and had normal bowel movements until last night when he added the Ibuprofen and Famotidine he was prescribed for his pericardial effusion.  I advised patient that the Ibuprofen was given for pain/inflammation associated with the effusion and the Famotidine was given with the Ibuprofen for stomach protection.  I advised patient that I will confirm with Dr. Eden Emms that it is okay for him to stop these medications.  Patient reports he is taking colchicine once daily (this medication was previously thought to be the cause of the diarrhea.  Routing message to Dr. Eden Emms for advice.

## 2013-05-25 NOTE — Telephone Encounter (Signed)
Reviewed with Dr. Eden Emms and if pt cannot take motrin can try an adult aspirin three times daily with his H2 blocker.  He should take colchicine once daily. Will need echo in 3 weeks. Echo can be arranged at follow up appt with Jacolyn Reedy, PA on June 05, 2013. I placed call to pt to give him this information and left message to call back.

## 2013-05-25 NOTE — Telephone Encounter (Signed)
Patient is experiencing diarrhea and wants to know what to do about it. He thinks that it could be due to his medication.

## 2013-05-26 NOTE — Telephone Encounter (Signed)
Reviewed information with pt who states understanding.  He will follow up as scheduled

## 2013-05-30 ENCOUNTER — Other Ambulatory Visit: Payer: Self-pay | Admitting: *Deleted

## 2013-05-30 DIAGNOSIS — I313 Pericardial effusion (noninflammatory): Secondary | ICD-10-CM

## 2013-05-31 ENCOUNTER — Encounter: Payer: Self-pay | Admitting: *Deleted

## 2013-05-31 DIAGNOSIS — H532 Diplopia: Secondary | ICD-10-CM | POA: Diagnosis not present

## 2013-05-31 DIAGNOSIS — H52209 Unspecified astigmatism, unspecified eye: Secondary | ICD-10-CM | POA: Diagnosis not present

## 2013-05-31 DIAGNOSIS — H2510 Age-related nuclear cataract, unspecified eye: Secondary | ICD-10-CM | POA: Diagnosis not present

## 2013-06-01 DIAGNOSIS — I309 Acute pericarditis, unspecified: Secondary | ICD-10-CM | POA: Diagnosis not present

## 2013-06-01 DIAGNOSIS — I10 Essential (primary) hypertension: Secondary | ICD-10-CM | POA: Diagnosis not present

## 2013-06-01 DIAGNOSIS — I4891 Unspecified atrial fibrillation: Secondary | ICD-10-CM | POA: Diagnosis not present

## 2013-06-01 DIAGNOSIS — E785 Hyperlipidemia, unspecified: Secondary | ICD-10-CM | POA: Diagnosis not present

## 2013-06-01 DIAGNOSIS — I951 Orthostatic hypotension: Secondary | ICD-10-CM | POA: Diagnosis not present

## 2013-06-05 ENCOUNTER — Ambulatory Visit (INDEPENDENT_AMBULATORY_CARE_PROVIDER_SITE_OTHER): Payer: Medicare Other | Admitting: Physician Assistant

## 2013-06-05 ENCOUNTER — Encounter: Payer: Self-pay | Admitting: Physician Assistant

## 2013-06-05 VITALS — BP 142/76 | HR 62 | Ht 72.0 in | Wt 175.0 lb

## 2013-06-05 DIAGNOSIS — I4891 Unspecified atrial fibrillation: Secondary | ICD-10-CM

## 2013-06-05 DIAGNOSIS — R55 Syncope and collapse: Secondary | ICD-10-CM

## 2013-06-05 DIAGNOSIS — I319 Disease of pericardium, unspecified: Secondary | ICD-10-CM

## 2013-06-05 DIAGNOSIS — I10 Essential (primary) hypertension: Secondary | ICD-10-CM

## 2013-06-05 DIAGNOSIS — I313 Pericardial effusion (noninflammatory): Secondary | ICD-10-CM

## 2013-06-05 NOTE — Progress Notes (Signed)
HPI:   This is a 79 year old male patient who still works as a Copywriter, advertising at [employer].  He recently had atrial fibrillation treated with Xarelto and amiodarone, and developed pericarditis. His Xarelto was stopped and he was treated with colchicine.   He presented to the ER again on 9/14 after pre-syncopal episode. It was felt he became dehydrated and was having diarrhea for 3 days prior to admission. It was felt the colchicine was contributing to his diarrhea. The patient also was on Motrin but did not tolerate this. Dr.Nishan told him to take ASA 325 mg three times a day.2-D echo on 05/22/13 showed moderate pericardial effusion with mild RA collapse, no RV collapse, EF 55-60%, IVC mildly dilated but collapses with inspiration.  The patient comes in today and has been feeling well. He says for the first time today he actually felt like he couldn't get a deep breath but before today he was feeling back to normal. He thinks it might be related to the humidity.  Overall his breathing feels pretty good. He walked all the way in here from the parking lot and did not get short of breath. He plans to travel to [place] to visit his daughter this weekend. He denies any chest pain, palpitations, dyspnea on exertion, orthopnea, dizziness or presyncope.   Allergies -- Cinnamon -- Itching  -- Percocet [Oxycodone-Acetaminophen] -- Other (See Comments)   --  confusion  -- Protonix [Pantoprazole Sodium] -- Diarrhea and Nausea And Vomiting  Current Outpatient Prescriptions on File Prior to Visit: amiodarone (PACERONE) 200 MG tablet, Take 1 tablet (200 mg total) by mouth 2 (two) times daily., Disp: 60 tablet, Rfl: 0 finasteride (PROSCAR) 5 MG tablet, Take 5 mg by mouth every other day. Dose is taken at bedtime, Disp: , Rfl:  levothyroxine (SYNTHROID, LEVOTHROID) 88 MCG tablet, Take 88 mcg by mouth daily before breakfast. , Disp: , Rfl:  metoprolol succinate (TOPROL-XL) 12.5 mg TB24 24 hr  tablet, Take 0.5 tablets (12.5 mg total) by mouth daily., Disp: 30 tablet, Rfl: 0 Multiple Vitamins-Iron (MULTIVITAMINS WITH IRON) TABS tablet, Take 1 tablet by mouth daily., Disp: , Rfl:   No current facility-administered medications on file prior to visit.   Past Medical History:   Thyroid disease                                              Hypercholesteremia                                           HTN (hypertension)                                           Hypothyroidism                                              Past Surgical History:   SHOULDER SURGERY  Right 2013           Comment:rotator cuff    KNEE SURGERY                                    Left 75 years *   ANKLE FRACTURE SURGERY                          Left 81 years *   HERNIA REPAIR                                    30 years *  No family history on file.   Social History   Marital Status: Married             Spouse Name:                      Years of Education:                 Number of children:             Occupational History   None on file  Social History Main Topics   Smoking Status: Never Smoker                     Smokeless Status: Not on file                      Alcohol Use: Yes           0.6 oz/week      1 Glasses of wine per week      Comment: 1 glass of wine   Drug Use: No             Sexual Activity: Not on file        Other Topics            Concern   None on file  Social History Narrative   None on file    ROS: Walks with a cane for chronic left leg pain. See history of present illness otherwise negative   PHYSICAL EXAM: Well-nournished, in no acute distress. Neck: No JVD, HJR, Bruit, or thyroid enlargement  Lungs: No tachypnea, clear without wheezing, rales, or rhonchi  Cardiovascular: RRR, PMI not displaced, 1/6 systolic murmur at the left sternal border no rub, gallops, bruit, thrill, or heave.  Abdomen: BS normal. Soft without organomegaly,  masses, lesions or tenderness.  Extremities: without cyanosis, clubbing or edema. Good distal pulses bilateral  SKin: Warm, no lesions or rashes   Musculoskeletal: No deformities  Neuro: no focal signs  BP 142/76  Pulse 62  Ht 6' (1.829 m)  Wt 175 lb (79.379 kg)  BMI 23.73 kg/m2    EKG: Normal sinus rhythm with PACs and LVH

## 2013-06-05 NOTE — Patient Instructions (Addendum)
Your physician recommends that you schedule a follow-up appointment in: 3-4 WEEKS WITH DR. [facility]   Your physician recommends that you continue on your current medications as directed. Please refer to the Current Medication list given to you today.  KEEP APPOINTMENT FOR ECHO October 8 AT 4 PM

## 2013-06-05 NOTE — Assessment & Plan Note (Signed)
Patient had pericarditis and didn't tolerate the Motrin. He is now on aspirin 325 mg 3 times daily with food. He is also on colchicine 0.6 mg once a day. He felt like he couldn't get a full breath this morning but other than that his breathing has been pretty good. He denies any chest pain. He is scheduled for a followup 2-D echo October 8.

## 2013-06-05 NOTE — Assessment & Plan Note (Signed)
Maintaining normal sinus rhythm 

## 2013-06-05 NOTE — Assessment & Plan Note (Signed)
Felt secondary to dehydration. Doing better.

## 2013-06-06 ENCOUNTER — Telehealth: Payer: Self-pay | Admitting: Cardiology

## 2013-06-06 ENCOUNTER — Other Ambulatory Visit (INDEPENDENT_AMBULATORY_CARE_PROVIDER_SITE_OTHER): Payer: Medicare Other

## 2013-06-06 ENCOUNTER — Ambulatory Visit (HOSPITAL_COMMUNITY): Payer: Medicare Other | Attending: Cardiovascular Disease | Admitting: Radiology

## 2013-06-06 DIAGNOSIS — R55 Syncope and collapse: Secondary | ICD-10-CM | POA: Diagnosis not present

## 2013-06-06 DIAGNOSIS — I10 Essential (primary) hypertension: Secondary | ICD-10-CM | POA: Diagnosis not present

## 2013-06-06 DIAGNOSIS — R0609 Other forms of dyspnea: Secondary | ICD-10-CM | POA: Insufficient documentation

## 2013-06-06 DIAGNOSIS — I4891 Unspecified atrial fibrillation: Secondary | ICD-10-CM | POA: Diagnosis not present

## 2013-06-06 DIAGNOSIS — R0989 Other specified symptoms and signs involving the circulatory and respiratory systems: Secondary | ICD-10-CM | POA: Insufficient documentation

## 2013-06-06 DIAGNOSIS — I079 Rheumatic tricuspid valve disease, unspecified: Secondary | ICD-10-CM | POA: Insufficient documentation

## 2013-06-06 DIAGNOSIS — I319 Disease of pericardium, unspecified: Secondary | ICD-10-CM | POA: Insufficient documentation

## 2013-06-06 DIAGNOSIS — I313 Pericardial effusion (noninflammatory): Secondary | ICD-10-CM

## 2013-06-06 DIAGNOSIS — I359 Nonrheumatic aortic valve disorder, unspecified: Secondary | ICD-10-CM | POA: Insufficient documentation

## 2013-06-06 DIAGNOSIS — R079 Chest pain, unspecified: Secondary | ICD-10-CM | POA: Insufficient documentation

## 2013-06-06 HISTORY — PX: TRANSTHORACIC ECHOCARDIOGRAM: SHX275

## 2013-06-06 LAB — BASIC METABOLIC PANEL
BUN: 15 mg/dL (ref 6–23)
CO2: 26 mEq/L (ref 19–32)
Glucose, Bld: 86 mg/dL (ref 70–99)
Potassium: 4.7 mEq/L (ref 3.5–5.1)
Sodium: 138 mEq/L (ref 135–145)

## 2013-06-06 NOTE — Telephone Encounter (Signed)
Also I would like to see him tomorrow morning in the office.  Please fit in.

## 2013-06-06 NOTE — Telephone Encounter (Signed)
Would increase aspirin to 650 mg every 8 hrs.  Take colchicine 0.6 mg daily.  Needs BMET and echo, would try to do both of them today preferentially or tomorrow.

## 2013-06-06 NOTE — Progress Notes (Signed)
Limited Echocardiogram performed.  

## 2013-06-06 NOTE — Telephone Encounter (Signed)
Spoke to patient Dr.McLean advised to have echo and bmet today. Echo scheduled at 4:00 pm today 06/06/13.Appoinmtent scheduled with Dr.McLean 06/07/13 12:15 pm.Advised to increase aspirin to 650 mg every 8 hrs with food.Take Colchicine 0.6 mg daily.

## 2013-06-06 NOTE — Addendum Note (Signed)
Addended by: Meda Klinefelter D on: 06/06/2013 10:12 AM   Modules accepted: Orders

## 2013-06-06 NOTE — Telephone Encounter (Signed)
New Problem:  PT states he has had some chest pain last night and today. Pt states last night his pain was at a 5 out of 10. Pt states now it is at a 1. Please advise

## 2013-06-06 NOTE — Telephone Encounter (Signed)
Received call from patient he stated he saw Herma Carson PA yesterday 06/05/13.Stated he had chest pain last night and is sob when he takes a deep breath.Stated chest pain is better this morning but he still is sob when he takes a deep breath.Stated he wanted to have echo scheduled sooner would like to have done within the next 3 days.Stated he is suppose to go out of town on Friday 06/09/13.Patient advised if starts having chest pain he needs to go to ER.Will have schedulers call him to schedule echo sooner.Message sent to Dr.Mclean for review.

## 2013-06-07 ENCOUNTER — Ambulatory Visit (INDEPENDENT_AMBULATORY_CARE_PROVIDER_SITE_OTHER): Payer: Medicare Other | Admitting: Cardiology

## 2013-06-07 VITALS — BP 150/58 | HR 58 | Ht 72.0 in | Wt 174.0 lb

## 2013-06-07 DIAGNOSIS — I4891 Unspecified atrial fibrillation: Secondary | ICD-10-CM

## 2013-06-07 DIAGNOSIS — I309 Acute pericarditis, unspecified: Secondary | ICD-10-CM

## 2013-06-07 DIAGNOSIS — I319 Disease of pericardium, unspecified: Secondary | ICD-10-CM

## 2013-06-07 DIAGNOSIS — I313 Pericardial effusion (noninflammatory): Secondary | ICD-10-CM

## 2013-06-07 MED ORDER — AMIODARONE HCL 200 MG PO TABS: 200.0000 mg | ORAL_TABLET | Freq: Every day | ORAL | Status: DC

## 2013-06-07 NOTE — Patient Instructions (Addendum)
Take aspirin 650mg  three times a day for 2 weeks. Then decrease aspirin to 325mg  daily. This will be 2 of a 325mg  tablet for 2 weeks then 1 of a 325mg  tablet daily. Continue this dose.    Decrease amiodarone to 200mg  daily.   Your physician recommends that you have  lab work today--Liver profile/TSH.  Your appt with Dr Shirlee Latch is 07/25/13 at 9AM.

## 2013-06-08 ENCOUNTER — Telehealth: Payer: Self-pay | Admitting: *Deleted

## 2013-06-08 ENCOUNTER — Encounter: Payer: Self-pay | Admitting: Cardiology

## 2013-06-08 DIAGNOSIS — I309 Acute pericarditis, unspecified: Secondary | ICD-10-CM | POA: Insufficient documentation

## 2013-06-08 LAB — HEPATIC FUNCTION PANEL
AST: 24 U/L (ref 0–37)
Albumin: 3.3 g/dL — ABNORMAL LOW (ref 3.5–5.2)
Alkaline Phosphatase: 79 U/L (ref 39–117)
Total Protein: 6.2 g/dL (ref 6.0–8.3)

## 2013-06-08 LAB — TSH: TSH: 1.11 u[IU]/mL (ref 0.35–5.50)

## 2013-06-08 NOTE — Progress Notes (Signed)
Patient ID: Nathaniel [name], male   DOB: March 17, 1920, 79 y.o.   MRN: [id number] PCP: Dr. Clarene Duke  79 yo with recent hospitalization for presumed acute pericarditis and atrial fibrillation presents for cardiology followup.  He was admitted in early 9/14 with pleuritic chest pain.  He had diffuse slight ST elevation on ECG and was thought to have acute pericarditis, ?viral pericarditis.  No pericardial effusion initially.  While in the hospital, he went into atrial fibrillation with RVR and was treated with amiodarone.  He converted to NSR and remains in NSR today.  He was started on Xarelto in the hospital.  Followup echo on 9/15 showed the development of a moderate pericardial effusion without tamponade.  Xarelto was stopped.  Patient had another echo on 9/30 (today) that showed a small pericardial effusion (improved).    Starting Monday night, he re-developed pleuritic chest pain, milder than the episode earlier this month that took him to the hospital.  The pain has improved since he was told to increase his aspirin dosing and is very minimal currently.  He otherwise feels well.  No tachypalpitations, no exertional dyspnea.  No orthopnea or PND.   Labs (9/14): K 4.7, creatinine 1.0, ESR 44  ECG: NSR, LVH, repolarization abnormality  PMH: 1. Hyperlipidemia 2. Hypothyroidism 3. HTN 4. Acute pericarditis: 9/14.  Echo (05/11/13) with EF 55-60%, normal RV, no pericardial effusion.  Echo (05/22/13) with moderate pericardial effusion, no tamponade.  Echo (06/06/13) with small pericardial effusion, no tamponade.  5. Paroxysmal atrial fibrillation: In setting of acute pericarditis when initially noted.  Was started on Xarelto but this was stopped when he developed a pericardial effusion.   SH: Lives alone at [place].  Nonsmoker.   FH: No premature CAD.   ROS: All systems reviewed and negative except as per HPI.   Current Outpatient Prescriptions  Medication Sig Dispense Refill  . aspirin 325 MG tablet  2 TAB TID for 2 weeks then 1 tablet daily      . colchicine 0.6 MG tablet Take 0.6 mg by mouth daily.      . finasteride (PROSCAR) 5 MG tablet Take 5 mg by mouth every other day. Dose is taken at bedtime      . levothyroxine (SYNTHROID, LEVOTHROID) 88 MCG tablet Take 88 mcg by mouth daily before breakfast.       . metoprolol succinate (TOPROL-XL) 12.5 mg TB24 24 hr tablet Take 0.5 tablets (12.5 mg total) by mouth daily.  30 tablet  0  . Multiple Vitamins-Iron (MULTIVITAMINS WITH IRON) TABS tablet Take 1 tablet by mouth daily.      Marland Kitchen amiodarone (PACERONE) 200 MG tablet Take 1 tablet (200 mg total) by mouth daily.  30 tablet  3   No current facility-administered medications for this visit.    BP 150/58  Pulse 58  Ht 6' (1.829 m)  Wt 78.926 kg (174 lb)  BMI 23.59 kg/m2 General: NAD Neck: No JVD, no thyromegaly or thyroid nodule.  Lungs: Clear to auscultation bilaterally with normal respiratory effort. CV: Nondisplaced PMI.  Heart regular S1/S2, no S3/S4, no murmur, no rub.  No peripheral edema.  No carotid bruit.  Normal pedal pulses.  Abdomen: Soft, nontender, no hepatosplenomegaly, no distention.  Skin: Intact without lesions or rashes.  Neurologic: Alert and oriented x 3.  Psych: Normal affect. Extremities: No clubbing or cyanosis.   Assessment/Plan: 1. Acute pericarditis: Possible mild recurrence beginning earlier this week.   - Continue ASA 650 q8 hrs for total of  2 wks, then resume 325 mg daily.  - Continue daily colchicine.  - Can take PPI daily while on high dose ASA, can stop when resume lower dose ASA.  2. Atrial fibrillation: Paroxysmal.  Had episode in the setting of acute pericarditis.  Now in NSR on amiodarone.  Xarelto was stopped due to possibility that it was associated with the development of pericardial effusion.  - Decrease amiodarone to 200 mg daily and check LFTs/TSH.  3. Pericardial effusion: Smaller on today's echo.  No tamponade.  Related to acute pericarditis  and possibly to Xarelto use. Off Xarelto.   Marca Ancona 06/08/2013

## 2013-06-08 NOTE — Telephone Encounter (Signed)
WHILE  GIVING PT  ECHO RESULTS PT    COMPLAINED WITH  2 EPISODES   OF LOOSE  STOOL  LAST NIGHT  AND  THIS AM   DIET  AND MEDS HAVE NOT  CHANGED . INFORMED   HAD  THIS  ISSUE IN PAST    THAT  RESULTED IN  DEHYDRATION AND   XARELTO  MAY  HAVE BEEN CULPRIT  PT  WANTED  DR MCLEAN  INFORMED  PT  AWARE WILL  FORWARD TO DR [facility] FOR  REVIEW  .Zack Seal

## 2013-06-08 NOTE — Telephone Encounter (Signed)
Pt advised.

## 2013-06-08 NOTE — Telephone Encounter (Signed)
Reviewed with Dr Darvin Neighbours recommended pt hold colchicine if diarrhea continues. LMTCB for pt

## 2013-06-14 ENCOUNTER — Other Ambulatory Visit: Payer: Self-pay

## 2013-06-14 ENCOUNTER — Other Ambulatory Visit (HOSPITAL_COMMUNITY): Payer: Medicare Other

## 2013-06-14 MED ORDER — METOPROLOL SUCCINATE 12.5 MG HALF TABLET: 12.5000 mg | ORAL_TABLET | Freq: Every day | ORAL | Status: DC

## 2013-06-21 ENCOUNTER — Other Ambulatory Visit: Payer: Self-pay | Admitting: Cardiology

## 2013-06-22 ENCOUNTER — Other Ambulatory Visit: Payer: Self-pay

## 2013-06-27 DIAGNOSIS — M4310 Spondylolisthesis, site unspecified: Secondary | ICD-10-CM | POA: Diagnosis not present

## 2013-06-27 DIAGNOSIS — M5137 Other intervertebral disc degeneration, lumbosacral region: Secondary | ICD-10-CM | POA: Diagnosis not present

## 2013-06-27 DIAGNOSIS — Z23 Encounter for immunization: Secondary | ICD-10-CM | POA: Diagnosis not present

## 2013-06-27 DIAGNOSIS — M47817 Spondylosis without myelopathy or radiculopathy, lumbosacral region: Secondary | ICD-10-CM | POA: Diagnosis not present

## 2013-06-27 DIAGNOSIS — M48061 Spinal stenosis, lumbar region without neurogenic claudication: Secondary | ICD-10-CM | POA: Diagnosis not present

## 2013-07-13 ENCOUNTER — Other Ambulatory Visit: Payer: Self-pay

## 2013-07-25 ENCOUNTER — Ambulatory Visit (INDEPENDENT_AMBULATORY_CARE_PROVIDER_SITE_OTHER): Payer: Medicare Other | Admitting: Cardiology

## 2013-07-25 ENCOUNTER — Encounter: Payer: Self-pay | Admitting: Cardiology

## 2013-07-25 VITALS — BP 148/84 | HR 61 | Ht 72.0 in | Wt 179.0 lb

## 2013-07-25 DIAGNOSIS — I309 Acute pericarditis, unspecified: Secondary | ICD-10-CM | POA: Diagnosis not present

## 2013-07-25 DIAGNOSIS — I319 Disease of pericardium, unspecified: Secondary | ICD-10-CM

## 2013-07-25 DIAGNOSIS — I4891 Unspecified atrial fibrillation: Secondary | ICD-10-CM | POA: Diagnosis not present

## 2013-07-25 DIAGNOSIS — I313 Pericardial effusion (noninflammatory): Secondary | ICD-10-CM

## 2013-07-25 MED ORDER — COLCHICINE 0.6 MG PO TABS: 0.6000 mg | ORAL_TABLET | Freq: Every day | ORAL | Status: DC

## 2013-07-25 NOTE — Progress Notes (Signed)
Patient ID: Nathaniel [name], male   DOB: 1920-01-28, 79 y.o.   MRN: [id number] PCP: Dr. Clarene Duke  79 yo with recent hospitalization for presumed acute pericarditis and atrial fibrillation presents for cardiology followup.  He was admitted in early 9/14 with pleuritic chest pain.  He had diffuse slight ST elevation on ECG and was thought to have acute pericarditis, ?viral pericarditis.  No pericardial effusion initially.  While in the hospital, he went into atrial fibrillation with RVR and was treated with amiodarone.  He converted to NSR and remains in NSR today.  He was started on Xarelto in the hospital.  Followup echo on 9/15 showed the development of a moderate pericardial effusion without tamponade.  Xarelto was stopped.  Patient had another echo on 9/30 that showed a small pericardial effusion (improved).    Since I last saw him, he has not had any more chest pain.  He has not had tachypalpitations.  He is in NSR today.  He has run out of colchicine.  No dyspnea.  Main complaint is constipation.   Labs (9/14): K 4.7, creatinine 1.0, ESR 44 Labs (10/14): TSH normal, LFTs normal  ECG: NSR, LAFB/RBBB  PMH: 1. Hyperlipidemia 2. Hypothyroidism 3. HTN 4. Acute pericarditis: 9/14.  Echo (05/11/13) with EF 55-60%, normal RV, no pericardial effusion.  Echo (05/22/13) with moderate pericardial effusion, no tamponade.  Echo (06/06/13) with small pericardial effusion, no tamponade.  5. Paroxysmal atrial fibrillation: In setting of acute pericarditis when initially noted.  Was started on Xarelto but this was stopped when he developed a pericardial effusion.   SH: Lives alone at [place].  Nonsmoker.   FH: No premature CAD.   ROS: All systems reviewed and negative except as per HPI.   Current Outpatient Prescriptions  Medication Sig Dispense Refill  . amiodarone (PACERONE) 200 MG tablet Take 1 tablet (200 mg total) by mouth daily.  30 tablet  3  . aspirin 325 MG tablet 2 TAB TID for 2 weeks then 1  tablet daily      . colchicine 0.6 MG tablet Take 0.6 mg by mouth daily.      . finasteride (PROSCAR) 5 MG tablet Take 5 mg by mouth every other day. Dose is taken at bedtime      . levothyroxine (SYNTHROID, LEVOTHROID) 88 MCG tablet Take 88 mcg by mouth daily before breakfast.       . metoprolol succinate (TOPROL-XL) 12.5 mg TB24 24 hr tablet Take 0.5 tablets (12.5 mg total) by mouth daily.  30 tablet  6  . Multiple Vitamins-Iron (MULTIVITAMINS WITH IRON) TABS tablet Take 1 tablet by mouth daily.       No current facility-administered medications for this visit.    BP 148/84  Pulse 61  Ht 6' (1.829 m)  Wt 179 lb (81.194 kg)  BMI 24.27 kg/m2 General: NAD Neck: No JVD, no thyromegaly or thyroid nodule.  Lungs: Clear to auscultation bilaterally with normal respiratory effort. CV: Nondisplaced PMI.  Heart regular S1/S2, no S3/S4, no murmur, no rub.  No peripheral edema.  No carotid bruit.  Normal pedal pulses.  Abdomen: Soft, nontender, no hepatosplenomegaly, no distention.  Skin: Intact without lesions or rashes.  Neurologic: Alert and oriented x 3.  Psych: Normal affect. Extremities: No clubbing or cyanosis.   Assessment/Plan: 1. Acute pericarditis: No chest pain since shortly after last appointment.    - Continue ASA 325 daily.  - Continue daily colchicine until followup appointment in 3 months, then will likely stop  it.  2. Atrial fibrillation: Paroxysmal.  Had episode in the setting of acute pericarditis.  Now in NSR on amiodarone.  Xarelto was stopped due to possibility that it was associated with the development of pericardial effusion.  - Needs repeat LFTs/TSH in 1/14.  - At 3 month followup, I will likely stop amiodarone.  I suspect that the atrial fibrillation was triggered by acute pericarditis and hopefully he will stay out of atrial fibrillation long-term.  3. Pericardial effusion: Related to acute pericarditis and possibly to Xarelto use. Off Xarelto.  Much smaller on last  echo.   Marca Ancona 07/25/2013

## 2013-07-25 NOTE — Patient Instructions (Addendum)
Start colchicine 0.6mg  daily.  You can use colace 100mg  two times a day as needed for constipation. You do not need a prescription for this.  You can use dulcolax 5mg  daily as needed for constiptation. You do not need a prescription for this.   Your physician recommends that you return for lab work in: January 2015--TSH/liver profile.  Your physician recommends that you schedule a follow-up appointment in: 3 months with Dr Shirlee Latch.

## 2013-09-14 ENCOUNTER — Other Ambulatory Visit (INDEPENDENT_AMBULATORY_CARE_PROVIDER_SITE_OTHER): Payer: Medicare Other

## 2013-09-14 DIAGNOSIS — I4891 Unspecified atrial fibrillation: Secondary | ICD-10-CM | POA: Diagnosis not present

## 2013-09-14 DIAGNOSIS — I309 Acute pericarditis, unspecified: Secondary | ICD-10-CM | POA: Diagnosis not present

## 2013-09-14 LAB — HEPATIC FUNCTION PANEL
ALK PHOS: 115 U/L (ref 39–117)
ALT: 32 U/L (ref 0–53)
AST: 30 U/L (ref 0–37)
Albumin: 3.6 g/dL (ref 3.5–5.2)
BILIRUBIN DIRECT: 0 mg/dL (ref 0.0–0.3)
BILIRUBIN TOTAL: 0.7 mg/dL (ref 0.3–1.2)
Total Protein: 6.6 g/dL (ref 6.0–8.3)

## 2013-09-14 LAB — TSH: TSH: 1.49 u[IU]/mL (ref 0.35–5.50)

## 2013-10-14 ENCOUNTER — Other Ambulatory Visit: Payer: Self-pay | Admitting: Cardiology

## 2013-10-18 ENCOUNTER — Encounter: Payer: Self-pay | Admitting: Cardiology

## 2013-10-18 ENCOUNTER — Ambulatory Visit (INDEPENDENT_AMBULATORY_CARE_PROVIDER_SITE_OTHER): Payer: Medicare Other | Admitting: Cardiology

## 2013-10-18 VITALS — BP 150/70 | HR 51 | Ht 72.0 in | Wt 179.0 lb

## 2013-10-18 DIAGNOSIS — I4891 Unspecified atrial fibrillation: Secondary | ICD-10-CM

## 2013-10-18 DIAGNOSIS — I309 Acute pericarditis, unspecified: Secondary | ICD-10-CM | POA: Diagnosis not present

## 2013-10-18 NOTE — Patient Instructions (Signed)
Your physician has recommended you make the following change in your medication:   1. Stop Amiodarone  2. Stop Colchicine  Your physician wants you to follow-up in: 1 year with Dr. Kendall Flack will receive a reminder letter in the mail two months in advance. If you don't receive a letter, please call our office to schedule the follow-up appointment.

## 2013-10-19 NOTE — Progress Notes (Signed)
Patient ID: JAMINE HIGHFILL, male   DOB: 10-Jan-1920, 80 y.o.   MRN: [id number] PCP: Dr. Rex Kras  80 yo with hospitalization in 9/14 for presumed acute pericarditis and atrial fibrillation presents for cardiology followup.  He was admitted in early 9/14 with pleuritic chest pain.  He had diffuse slight ST elevation on ECG and was thought to have acute pericarditis, ?viral pericarditis.  No pericardial effusion initially.  While in the hospital, he went into atrial fibrillation with RVR and was treated with amiodarone.  He converted to NSR and remains in NSR today.  He was started on Xarelto in the hospital.  Followup echo later in 9/14 showed the development of a moderate pericardial effusion without tamponade.  Xarelto was stopped.  Patient had another echo on 06/06/13 that showed a small pericardial effusion (improved).    Since I last saw him, he has not had any more chest pain.  He has not had tachypalpitations.  He is in NSR today.  No dyspnea.  Main complaint is left knee pain.    Labs (9/14): K 4.7, creatinine 1.0, ESR 44 Labs (10/14): TSH normal, LFTs normal Labs (1/15): LFTs normal, TSH normal  ECG: NSR, LVH with QRS widening  PMH: 1. Hyperlipidemia 2. Hypothyroidism 3. HTN 4. Acute pericarditis: 9/14.  Echo (05/11/13) with EF 55-60%, normal RV, no pericardial effusion.  Echo (05/22/13) with moderate pericardial effusion, no tamponade.  Echo (06/06/13) with small pericardial effusion, no tamponade.  5. Paroxysmal atrial fibrillation: In setting of acute pericarditis when initially noted.  Was started on Xarelto but this was stopped when he developed a pericardial effusion.   SH: Lives alone at [place].  Nonsmoker.   FH: No premature CAD.   ROS: All systems reviewed and negative except as per HPI.   Current Outpatient Prescriptions  Medication Sig Dispense Refill  . aspirin 325 MG tablet 2 TAB TID for 2 weeks then 1 tablet daily      . finasteride (PROSCAR) 5 MG tablet Take 5 mg by  mouth every other day. Dose is taken at bedtime      . levothyroxine (SYNTHROID, LEVOTHROID) 88 MCG tablet Take 88 mcg by mouth daily before breakfast.       . metoprolol succinate (TOPROL-XL) 12.5 mg TB24 24 hr tablet Take 0.5 tablets (12.5 mg total) by mouth daily.  30 tablet  6  . Multiple Vitamins-Iron (MULTIVITAMINS WITH IRON) TABS tablet Take 1 tablet by mouth daily.       No current facility-administered medications for this visit.    BP 150/70  Pulse 51  Ht 6' (1.829 m)  Wt 81.194 kg (179 lb)  BMI 24.27 kg/m2 General: NAD Neck: No JVD, no thyromegaly or thyroid nodule.  Lungs: Clear to auscultation bilaterally with normal respiratory effort. CV: Nondisplaced PMI.  Heart regular S1/S2, no S3/S4, 1/6 HSM LLSB, no rub.  1+ edema 1/3 to knees bilaterally.  No carotid bruit.  Normal pedal pulses.  Abdomen: Soft, nontender, no hepatosplenomegaly, no distention.  Skin: Intact without lesions or rashes.  Neurologic: Alert and oriented x 3.  Psych: Normal affect. Extremities: No clubbing or cyanosis.   Assessment/Plan: 1. Acute pericarditis: No recurrent chest pain. - Continue ASA.  - He has been on colchicine for > 3 months now so can stop it.   2. Atrial fibrillation: Paroxysmal.  Had episode in the setting of acute pericarditis.  Now in NSR on amiodarone.  Xarelto was stopped due to possibility that it was associated with the  development of pericardial effusion.  - He is far enough out at this point without recurrent symptoms that I think we can stop the amiodarone.  I suspect that the atrial fibrillation was triggered by acute pericarditis and hopefully he will stay out of atrial fibrillation long-term.  3. Pericardial effusion: Related to acute pericarditis and possibly to Xarelto use. Off Xarelto.  Much smaller on last echo.   Followup in 1 year.   Loralie Champagne 10/19/2013

## 2013-10-25 ENCOUNTER — Ambulatory Visit: Payer: Medicare Other | Admitting: Cardiology

## 2013-12-13 DIAGNOSIS — N400 Enlarged prostate without lower urinary tract symptoms: Secondary | ICD-10-CM | POA: Diagnosis not present

## 2013-12-13 DIAGNOSIS — R351 Nocturia: Secondary | ICD-10-CM | POA: Diagnosis not present

## 2014-02-13 DIAGNOSIS — M545 Low back pain, unspecified: Secondary | ICD-10-CM | POA: Diagnosis not present

## 2014-02-13 DIAGNOSIS — M5137 Other intervertebral disc degeneration, lumbosacral region: Secondary | ICD-10-CM | POA: Diagnosis not present

## 2014-02-13 DIAGNOSIS — M48061 Spinal stenosis, lumbar region without neurogenic claudication: Secondary | ICD-10-CM | POA: Diagnosis not present

## 2014-02-13 DIAGNOSIS — M4310 Spondylolisthesis, site unspecified: Secondary | ICD-10-CM | POA: Diagnosis not present

## 2014-02-14 DIAGNOSIS — M48061 Spinal stenosis, lumbar region without neurogenic claudication: Secondary | ICD-10-CM | POA: Diagnosis not present

## 2014-02-14 DIAGNOSIS — M4310 Spondylolisthesis, site unspecified: Secondary | ICD-10-CM | POA: Diagnosis not present

## 2014-02-14 DIAGNOSIS — M47817 Spondylosis without myelopathy or radiculopathy, lumbosacral region: Secondary | ICD-10-CM | POA: Diagnosis not present

## 2014-02-14 DIAGNOSIS — M5137 Other intervertebral disc degeneration, lumbosacral region: Secondary | ICD-10-CM | POA: Diagnosis not present

## 2014-03-15 ENCOUNTER — Encounter: Payer: Self-pay | Admitting: Nurse Practitioner

## 2014-03-15 ENCOUNTER — Ambulatory Visit (INDEPENDENT_AMBULATORY_CARE_PROVIDER_SITE_OTHER): Payer: Medicare Other | Admitting: Nurse Practitioner

## 2014-03-15 VITALS — BP 160/60 | HR 69 | Ht 72.0 in | Wt 178.8 lb

## 2014-03-15 DIAGNOSIS — R0989 Other specified symptoms and signs involving the circulatory and respiratory systems: Secondary | ICD-10-CM | POA: Diagnosis not present

## 2014-03-15 DIAGNOSIS — I4891 Unspecified atrial fibrillation: Secondary | ICD-10-CM

## 2014-03-15 DIAGNOSIS — R0600 Dyspnea, unspecified: Secondary | ICD-10-CM

## 2014-03-15 DIAGNOSIS — R0609 Other forms of dyspnea: Secondary | ICD-10-CM

## 2014-03-15 DIAGNOSIS — R609 Edema, unspecified: Secondary | ICD-10-CM

## 2014-03-15 DIAGNOSIS — I480 Paroxysmal atrial fibrillation: Secondary | ICD-10-CM

## 2014-03-15 DIAGNOSIS — I10 Essential (primary) hypertension: Secondary | ICD-10-CM

## 2014-03-15 LAB — CBC
HCT: 43.1 % (ref 39.0–52.0)
Hemoglobin: 14.4 g/dL (ref 13.0–17.0)
MCHC: 33.4 g/dL (ref 30.0–36.0)
MCV: 94.6 fl (ref 78.0–100.0)
Platelets: 212 10*3/uL (ref 150.0–400.0)
RBC: 4.56 Mil/uL (ref 4.22–5.81)
RDW: 14.8 % (ref 11.5–15.5)
WBC: 6 10*3/uL (ref 4.0–10.5)

## 2014-03-15 LAB — BASIC METABOLIC PANEL
BUN: 16 mg/dL (ref 6–23)
CO2: 28 mEq/L (ref 19–32)
Calcium: 9.4 mg/dL (ref 8.4–10.5)
Chloride: 102 mEq/L (ref 96–112)
Creatinine, Ser: 1 mg/dL (ref 0.4–1.5)
GFR: 72.6 mL/min (ref >60.00)
Glucose, Bld: 102 mg/dL — ABNORMAL HIGH (ref 70–99)
Potassium: 4.1 mEq/L (ref 3.5–5.1)
Sodium: 136 mEq/L (ref 135–145)

## 2014-03-15 LAB — BRAIN NATRIURETIC PEPTIDE: Pro B Natriuretic peptide (BNP): 49 pg/mL (ref 0.0–100.0)

## 2014-03-15 NOTE — Progress Notes (Signed)
Nathaniel [name] Date of Birth: 1920-05-29 Medical Record #[id number]  History of Present Illness: Nathaniel [name] is seen back today for a work in visit. Seen for Dr. Aundra Dubin. He is a 80 year old male with a history of HTN, HLD, hypothyroidism and past acute pericarditis. Has had PAF - in the setting of acute pericarditis - was started on Xarelto but this was stopped when he developed an effusion.  Last seen here in February. Was doing well. In NSR. Amiodarone was stopped.  Comes back today. Here alone. He is here because his daughter called and wanted him seen. She thought he was more short of breath than what he should have been. He was visiting with her over the past weekend. Did have to walk more - to go fishing - more incline. Has had swelling in his legs - left worse than right. Watches his salt. Weight actually down. Feels like his rhythm has been ok. No palpitations. No falls. Uses a walker. Sits with his legs down - does not elevate or wear support stockings. No cough. No PND/orthopnea.   Current Outpatient Prescriptions  Medication Sig Dispense Refill  . aspirin 325 MG tablet 2 TAB TID for 2 weeks then 1 tablet daily      . finasteride (PROSCAR) 5 MG tablet Take 5 mg by mouth daily. Dose is taken at bedtime      . levothyroxine (SYNTHROID, LEVOTHROID) 88 MCG tablet Take 88 mcg by mouth daily before breakfast.       . metoprolol succinate (TOPROL-XL) 12.5 mg TB24 24 hr tablet Take 0.5 tablets (12.5 mg total) by mouth daily.  30 tablet  6  . Multiple Vitamins-Iron (MULTIVITAMINS WITH IRON) TABS tablet Take 1 tablet by mouth daily.       No current facility-administered medications for this visit.    Allergies  Allergen Reactions  . Cinnamon Itching  . Percocet [Oxycodone-Acetaminophen] Other (See Comments)    confusion  . Protonix [Pantoprazole Sodium] Diarrhea and Nausea And Vomiting  . Xarelto [Rivaroxaban]     Diaharrea, dehydration    Past Medical History  Diagnosis Date    . Thyroid disease   . Hypercholesteremia   . HTN (hypertension)   . Hypothyroidism     Past Surgical History  Procedure Laterality Date  . Shoulder surgery Right 2013    rotator cuff   . Knee surgery Left 73 years ago  . Ankle fracture surgery Left 79 years ago  . Hernia repair  28 years ago    History  Smoking status  . Never Smoker   Smokeless tobacco  . Not on file    History  Alcohol Use  . 0.6 oz/week  . 1 Glasses of wine per week    Comment: 1 glass of wine    No family history on file.  Review of Systems: The review of systems is per the HPI.  All other systems were reviewed and are negative.  Physical Exam: BP 160/60  Pulse 69  Ht 6' (1.829 m)  Wt 178 lb 12.8 oz (81.103 kg)  BMI 24.24 kg/m2  SpO2 95% Patient is very pleasant and in no acute distress. Using a walker. Skin is warm and dry. Color is normal.  HEENT is unremarkable. Normocephalic/atraumatic. PERRL. Sclera are nonicteric. Neck is supple. No masses. No JVD. Lungs are clear. Cardiac exam shows an irregular rhythm. Rate is ok.  Abdomen is soft. Extremities are without edema. Gait and ROM are intact. Gait unsteady. No gross  neurologic deficits noted.  Wt Readings from Last 3 Encounters:  03/15/14 178 lb 12.8 oz (81.103 kg)  10/18/13 179 lb (81.194 kg)  07/25/13 179 lb (81.194 kg)    LABORATORY DATA/PROCEDURES: EKG today shows   Lab Results  Component Value Date   WBC 6.5 05/22/2013   HGB 11.6* 05/22/2013   HCT 34.9* 05/22/2013   PLT 256 05/22/2013   GLUCOSE 86 06/06/2013   ALT 32 09/14/2013   AST 30 09/14/2013   NA 138 06/06/2013   K 4.7 06/06/2013   CL 102 06/06/2013   CREATININE 1.0 06/06/2013   BUN 15 06/06/2013   CO2 26 06/06/2013   TSH 1.49 09/14/2013    BNP (last 3 results) No results found for this basename: PROBNP,  in the last 8760 hours   Echo Study Conclusions from September 2014  - Left ventricle: The cavity size was normal. Systolic function was normal. The estimated ejection  fraction was in the range of 55% to 60%. Wall motion was normal; there were no regional wall motion abnormalities. - Aortic valve: Mild regurgitation. - Left atrium: The atrium was mildly dilated. - Right atrium: The atrium was mildly dilated. - Tricuspid valve: Mild-moderate regurgitation. - Pericardium, extracardiac: A small pericardial effusion was identified. Impressions:  - Limited study to fu pericardial effusion. Compared to study of 05/23/13, pericardial effusion appears to be smaller; no evidence of tamponade physiology.    Assessment / Plan:  1. PAF - he was pretty irregular on exam but EKG today shows sinus.   2. HTN - recheck by me is down to 130/70  3. Swelling with DOE -  Will check lab to include BNP. Support stockings encouraged and to elevate his legs. Continue with salt restriction. Does not need repeat echo at this time. If BNP up would consider low dose diuretic.  4. Past episode of acute pericarditis with past effusion  Patient is agreeable to this plan and will call if any problems develop in the interim.   Burtis Junes, RN, [facility] [address] [place], [place]  [phone]

## 2014-03-15 NOTE — Patient Instructions (Signed)
We will check lab today - this will tell me more about your shortness of breath  Medium compression support stockings are encouraged - you can get these at the medical supply store or drug store  Elevate your legs  Keep restricting your salt  Stay on your current medicines for now but if your "fluid level test" comes back elevated - I will put you on a low dose of a fluid pill  Call the [place] office at [phone] if you have any questions, problems or concerns.

## 2014-03-16 ENCOUNTER — Telehealth: Payer: Self-pay | Admitting: Cardiology

## 2014-03-16 NOTE — Telephone Encounter (Signed)
New message    Patient calling stating nurse Andee Poles called him - returning call back

## 2014-03-19 ENCOUNTER — Other Ambulatory Visit: Payer: Self-pay | Admitting: Dermatology

## 2014-03-19 DIAGNOSIS — Z85828 Personal history of other malignant neoplasm of skin: Secondary | ICD-10-CM | POA: Diagnosis not present

## 2014-03-19 DIAGNOSIS — D485 Neoplasm of uncertain behavior of skin: Secondary | ICD-10-CM | POA: Diagnosis not present

## 2014-03-19 DIAGNOSIS — Z85820 Personal history of malignant melanoma of skin: Secondary | ICD-10-CM | POA: Diagnosis not present

## 2014-03-19 DIAGNOSIS — L570 Actinic keratosis: Secondary | ICD-10-CM | POA: Diagnosis not present

## 2014-03-19 DIAGNOSIS — L821 Other seborrheic keratosis: Secondary | ICD-10-CM | POA: Diagnosis not present

## 2014-03-20 DIAGNOSIS — M5137 Other intervertebral disc degeneration, lumbosacral region: Secondary | ICD-10-CM | POA: Diagnosis not present

## 2014-03-20 DIAGNOSIS — M47817 Spondylosis without myelopathy or radiculopathy, lumbosacral region: Secondary | ICD-10-CM | POA: Diagnosis not present

## 2014-03-20 DIAGNOSIS — M48061 Spinal stenosis, lumbar region without neurogenic claudication: Secondary | ICD-10-CM | POA: Diagnosis not present

## 2014-03-20 DIAGNOSIS — M4310 Spondylolisthesis, site unspecified: Secondary | ICD-10-CM | POA: Diagnosis not present

## 2014-05-17 DIAGNOSIS — R7301 Impaired fasting glucose: Secondary | ICD-10-CM | POA: Diagnosis not present

## 2014-05-17 DIAGNOSIS — I10 Essential (primary) hypertension: Secondary | ICD-10-CM | POA: Diagnosis not present

## 2014-05-17 DIAGNOSIS — N400 Enlarged prostate without lower urinary tract symptoms: Secondary | ICD-10-CM | POA: Diagnosis not present

## 2014-05-17 DIAGNOSIS — Z23 Encounter for immunization: Secondary | ICD-10-CM | POA: Diagnosis not present

## 2014-05-17 DIAGNOSIS — I4891 Unspecified atrial fibrillation: Secondary | ICD-10-CM | POA: Diagnosis not present

## 2014-05-17 DIAGNOSIS — E039 Hypothyroidism, unspecified: Secondary | ICD-10-CM | POA: Diagnosis not present

## 2014-05-17 DIAGNOSIS — E782 Mixed hyperlipidemia: Secondary | ICD-10-CM | POA: Diagnosis not present

## 2014-05-22 DIAGNOSIS — M47817 Spondylosis without myelopathy or radiculopathy, lumbosacral region: Secondary | ICD-10-CM | POA: Diagnosis not present

## 2014-05-22 DIAGNOSIS — M545 Low back pain, unspecified: Secondary | ICD-10-CM | POA: Diagnosis not present

## 2014-05-22 DIAGNOSIS — M5137 Other intervertebral disc degeneration, lumbosacral region: Secondary | ICD-10-CM | POA: Diagnosis not present

## 2014-05-22 DIAGNOSIS — M48061 Spinal stenosis, lumbar region without neurogenic claudication: Secondary | ICD-10-CM | POA: Diagnosis not present

## 2014-05-26 DIAGNOSIS — M48061 Spinal stenosis, lumbar region without neurogenic claudication: Secondary | ICD-10-CM | POA: Diagnosis not present

## 2014-05-26 DIAGNOSIS — M4800 Spinal stenosis, site unspecified: Secondary | ICD-10-CM | POA: Diagnosis not present

## 2014-05-30 DIAGNOSIS — M25569 Pain in unspecified knee: Secondary | ICD-10-CM | POA: Diagnosis not present

## 2014-05-30 DIAGNOSIS — M6281 Muscle weakness (generalized): Secondary | ICD-10-CM | POA: Diagnosis not present

## 2014-05-30 DIAGNOSIS — R269 Unspecified abnormalities of gait and mobility: Secondary | ICD-10-CM | POA: Diagnosis not present

## 2014-05-30 DIAGNOSIS — M48061 Spinal stenosis, lumbar region without neurogenic claudication: Secondary | ICD-10-CM | POA: Diagnosis not present

## 2014-05-30 DIAGNOSIS — M549 Dorsalgia, unspecified: Secondary | ICD-10-CM | POA: Diagnosis not present

## 2014-06-01 DIAGNOSIS — M6281 Muscle weakness (generalized): Secondary | ICD-10-CM | POA: Diagnosis not present

## 2014-06-01 DIAGNOSIS — R269 Unspecified abnormalities of gait and mobility: Secondary | ICD-10-CM | POA: Diagnosis not present

## 2014-06-01 DIAGNOSIS — M48061 Spinal stenosis, lumbar region without neurogenic claudication: Secondary | ICD-10-CM | POA: Diagnosis not present

## 2014-06-01 DIAGNOSIS — M549 Dorsalgia, unspecified: Secondary | ICD-10-CM | POA: Diagnosis not present

## 2014-06-01 DIAGNOSIS — M25569 Pain in unspecified knee: Secondary | ICD-10-CM | POA: Diagnosis not present

## 2014-06-04 DIAGNOSIS — M549 Dorsalgia, unspecified: Secondary | ICD-10-CM | POA: Diagnosis not present

## 2014-06-04 DIAGNOSIS — R269 Unspecified abnormalities of gait and mobility: Secondary | ICD-10-CM | POA: Diagnosis not present

## 2014-06-04 DIAGNOSIS — M48061 Spinal stenosis, lumbar region without neurogenic claudication: Secondary | ICD-10-CM | POA: Diagnosis not present

## 2014-06-04 DIAGNOSIS — M6281 Muscle weakness (generalized): Secondary | ICD-10-CM | POA: Diagnosis not present

## 2014-06-04 DIAGNOSIS — M25569 Pain in unspecified knee: Secondary | ICD-10-CM | POA: Diagnosis not present

## 2014-06-05 ENCOUNTER — Other Ambulatory Visit: Payer: Self-pay | Admitting: Cardiology

## 2014-06-06 DIAGNOSIS — M549 Dorsalgia, unspecified: Secondary | ICD-10-CM | POA: Diagnosis not present

## 2014-06-06 DIAGNOSIS — M25569 Pain in unspecified knee: Secondary | ICD-10-CM | POA: Diagnosis not present

## 2014-06-06 DIAGNOSIS — M48061 Spinal stenosis, lumbar region without neurogenic claudication: Secondary | ICD-10-CM | POA: Diagnosis not present

## 2014-06-06 DIAGNOSIS — M6281 Muscle weakness (generalized): Secondary | ICD-10-CM | POA: Diagnosis not present

## 2014-06-06 DIAGNOSIS — R269 Unspecified abnormalities of gait and mobility: Secondary | ICD-10-CM | POA: Diagnosis not present

## 2014-06-07 DIAGNOSIS — M4806 Spinal stenosis, lumbar region: Secondary | ICD-10-CM | POA: Diagnosis not present

## 2014-06-07 DIAGNOSIS — M25562 Pain in left knee: Secondary | ICD-10-CM | POA: Diagnosis not present

## 2014-06-07 DIAGNOSIS — M545 Low back pain: Secondary | ICD-10-CM | POA: Diagnosis not present

## 2014-06-07 DIAGNOSIS — M6281 Muscle weakness (generalized): Secondary | ICD-10-CM | POA: Diagnosis not present

## 2014-06-07 DIAGNOSIS — R2689 Other abnormalities of gait and mobility: Secondary | ICD-10-CM | POA: Diagnosis not present

## 2014-06-11 DIAGNOSIS — M4806 Spinal stenosis, lumbar region: Secondary | ICD-10-CM | POA: Diagnosis not present

## 2014-06-11 DIAGNOSIS — M25562 Pain in left knee: Secondary | ICD-10-CM | POA: Diagnosis not present

## 2014-06-11 DIAGNOSIS — M545 Low back pain: Secondary | ICD-10-CM | POA: Diagnosis not present

## 2014-06-11 DIAGNOSIS — R2689 Other abnormalities of gait and mobility: Secondary | ICD-10-CM | POA: Diagnosis not present

## 2014-06-11 DIAGNOSIS — M6281 Muscle weakness (generalized): Secondary | ICD-10-CM | POA: Diagnosis not present

## 2014-06-12 ENCOUNTER — Other Ambulatory Visit: Payer: Self-pay | Admitting: Cardiology

## 2014-06-13 DIAGNOSIS — R2689 Other abnormalities of gait and mobility: Secondary | ICD-10-CM | POA: Diagnosis not present

## 2014-06-13 DIAGNOSIS — M4806 Spinal stenosis, lumbar region: Secondary | ICD-10-CM | POA: Diagnosis not present

## 2014-06-13 DIAGNOSIS — M6281 Muscle weakness (generalized): Secondary | ICD-10-CM | POA: Diagnosis not present

## 2014-06-13 DIAGNOSIS — M545 Low back pain: Secondary | ICD-10-CM | POA: Diagnosis not present

## 2014-06-13 DIAGNOSIS — M25562 Pain in left knee: Secondary | ICD-10-CM | POA: Diagnosis not present

## 2014-06-14 DIAGNOSIS — M25562 Pain in left knee: Secondary | ICD-10-CM | POA: Diagnosis not present

## 2014-06-14 DIAGNOSIS — M4806 Spinal stenosis, lumbar region: Secondary | ICD-10-CM | POA: Diagnosis not present

## 2014-06-14 DIAGNOSIS — M6281 Muscle weakness (generalized): Secondary | ICD-10-CM | POA: Diagnosis not present

## 2014-06-14 DIAGNOSIS — M545 Low back pain: Secondary | ICD-10-CM | POA: Diagnosis not present

## 2014-06-14 DIAGNOSIS — R2689 Other abnormalities of gait and mobility: Secondary | ICD-10-CM | POA: Diagnosis not present

## 2014-06-18 DIAGNOSIS — M47816 Spondylosis without myelopathy or radiculopathy, lumbar region: Secondary | ICD-10-CM | POA: Diagnosis not present

## 2014-06-18 DIAGNOSIS — Z6825 Body mass index (BMI) 25.0-25.9, adult: Secondary | ICD-10-CM | POA: Diagnosis not present

## 2014-06-18 DIAGNOSIS — M4806 Spinal stenosis, lumbar region: Secondary | ICD-10-CM | POA: Diagnosis not present

## 2014-06-18 DIAGNOSIS — M545 Low back pain: Secondary | ICD-10-CM | POA: Diagnosis not present

## 2014-06-18 DIAGNOSIS — M5136 Other intervertebral disc degeneration, lumbar region: Secondary | ICD-10-CM | POA: Diagnosis not present

## 2014-06-22 NOTE — Telephone Encounter (Signed)
Pt was seen in office/tmj

## 2014-06-27 DIAGNOSIS — H5203 Hypermetropia, bilateral: Secondary | ICD-10-CM | POA: Diagnosis not present

## 2014-06-27 DIAGNOSIS — H2513 Age-related nuclear cataract, bilateral: Secondary | ICD-10-CM | POA: Diagnosis not present

## 2014-07-05 DIAGNOSIS — M47816 Spondylosis without myelopathy or radiculopathy, lumbar region: Secondary | ICD-10-CM | POA: Diagnosis not present

## 2014-07-05 DIAGNOSIS — M4807 Spinal stenosis, lumbosacral region: Secondary | ICD-10-CM | POA: Diagnosis not present

## 2014-07-05 DIAGNOSIS — M5126 Other intervertebral disc displacement, lumbar region: Secondary | ICD-10-CM | POA: Diagnosis not present

## 2014-07-05 DIAGNOSIS — M4808 Spinal stenosis, sacral and sacrococcygeal region: Secondary | ICD-10-CM | POA: Diagnosis not present

## 2014-07-05 DIAGNOSIS — M4806 Spinal stenosis, lumbar region: Secondary | ICD-10-CM | POA: Diagnosis not present

## 2014-07-05 DIAGNOSIS — M5136 Other intervertebral disc degeneration, lumbar region: Secondary | ICD-10-CM | POA: Diagnosis not present

## 2014-07-17 DIAGNOSIS — M25561 Pain in right knee: Secondary | ICD-10-CM | POA: Diagnosis not present

## 2014-07-17 DIAGNOSIS — M179 Osteoarthritis of knee, unspecified: Secondary | ICD-10-CM | POA: Diagnosis not present

## 2014-07-17 DIAGNOSIS — M25562 Pain in left knee: Secondary | ICD-10-CM | POA: Diagnosis not present

## 2014-08-15 DIAGNOSIS — H6980 Other specified disorders of Eustachian tube, unspecified ear: Secondary | ICD-10-CM | POA: Diagnosis not present

## 2014-08-15 DIAGNOSIS — Z23 Encounter for immunization: Secondary | ICD-10-CM | POA: Diagnosis not present

## 2014-09-14 DIAGNOSIS — M47816 Spondylosis without myelopathy or radiculopathy, lumbar region: Secondary | ICD-10-CM | POA: Diagnosis not present

## 2014-09-14 DIAGNOSIS — M5136 Other intervertebral disc degeneration, lumbar region: Secondary | ICD-10-CM | POA: Diagnosis not present

## 2014-09-14 DIAGNOSIS — M4806 Spinal stenosis, lumbar region: Secondary | ICD-10-CM | POA: Diagnosis not present

## 2014-09-14 DIAGNOSIS — M545 Low back pain: Secondary | ICD-10-CM | POA: Diagnosis not present

## 2014-09-20 ENCOUNTER — Other Ambulatory Visit: Payer: Self-pay | Admitting: Dermatology

## 2014-09-20 DIAGNOSIS — L821 Other seborrheic keratosis: Secondary | ICD-10-CM | POA: Diagnosis not present

## 2014-09-20 DIAGNOSIS — D485 Neoplasm of uncertain behavior of skin: Secondary | ICD-10-CM | POA: Diagnosis not present

## 2014-09-20 DIAGNOSIS — L570 Actinic keratosis: Secondary | ICD-10-CM | POA: Diagnosis not present

## 2014-10-11 DIAGNOSIS — J342 Deviated nasal septum: Secondary | ICD-10-CM | POA: Diagnosis not present

## 2014-10-11 DIAGNOSIS — H6521 Chronic serous otitis media, right ear: Secondary | ICD-10-CM | POA: Diagnosis not present

## 2014-10-11 DIAGNOSIS — J322 Chronic ethmoidal sinusitis: Secondary | ICD-10-CM | POA: Diagnosis not present

## 2014-10-11 DIAGNOSIS — J370 Chronic laryngitis: Secondary | ICD-10-CM | POA: Diagnosis not present

## 2014-10-11 DIAGNOSIS — J320 Chronic maxillary sinusitis: Secondary | ICD-10-CM | POA: Diagnosis not present

## 2014-10-23 DIAGNOSIS — J040 Acute laryngitis: Secondary | ICD-10-CM | POA: Diagnosis not present

## 2014-10-23 DIAGNOSIS — J320 Chronic maxillary sinusitis: Secondary | ICD-10-CM | POA: Diagnosis not present

## 2014-10-23 DIAGNOSIS — J322 Chronic ethmoidal sinusitis: Secondary | ICD-10-CM | POA: Diagnosis not present

## 2014-10-31 ENCOUNTER — Ambulatory Visit (INDEPENDENT_AMBULATORY_CARE_PROVIDER_SITE_OTHER): Payer: Medicare Other | Admitting: Cardiology

## 2014-10-31 ENCOUNTER — Encounter: Payer: Self-pay | Admitting: Cardiology

## 2014-10-31 VITALS — BP 122/56 | HR 70 | Ht 72.0 in | Wt 174.0 lb

## 2014-10-31 DIAGNOSIS — I309 Acute pericarditis, unspecified: Secondary | ICD-10-CM

## 2014-10-31 DIAGNOSIS — I480 Paroxysmal atrial fibrillation: Secondary | ICD-10-CM

## 2014-10-31 NOTE — Patient Instructions (Signed)
Your physician wants you to follow-up in: 6 months with Dr Aundra Dubin. (August 2016).  You will receive a reminder letter in the mail two months in advance. If you don't receive a letter, please call our office to schedule the follow-up appointment.

## 2014-10-31 NOTE — Progress Notes (Signed)
Patient ID: Nathaniel [name], male   DOB: 03/14/1920, 81 y.o.   MRN: [id number] PCP: Dr. Rex Kras  81 yo with hospitalization in 9/14 for presumed acute pericarditis and atrial fibrillation presents for cardiology followup.  He was admitted in early 9/14 with pleuritic chest pain.  He had diffuse slight ST elevation on ECG and was thought to have acute pericarditis, ?viral pericarditis.  No pericardial effusion initially.  While in the hospital, he went into atrial fibrillation with RVR and was treated with amiodarone.  He converted to NSR and remains in NSR today.  He was started on Xarelto in the hospital.  Followup echo later in 9/14 showed the development of a moderate pericardial effusion without tamponade.  Xarelto was stopped.  Patient had another echo on 06/06/13 that showed a small pericardial effusion (improved).    Since I last saw him, he has not had any more chest pain.  He has not had tachypalpitations.  He is in NSR today.  No dyspnea.  He has low back pain and is getting steroid injections.  He uses a walker for balance and because of back pain.  He does seated exercises at [employer]  He has chronic lower extremity edema, BNP has been normal.  Weight is down 4 lbs compared to prior appointment.   Labs (9/14): K 4.7, creatinine 1.0, ESR 44 Labs (10/14): TSH normal, LFTs normal Labs (1/15): LFTs normal, TSH normal Labs (7/15): BNP 49, K 4.1, creatinine 1.0  ECG: NSR, LAFB, PACs  PMH: 1. Hyperlipidemia 2. Hypothyroidism 3. HTN 4. Acute pericarditis: 9/14.  Echo (05/11/13) with EF 55-60%, normal RV, no pericardial effusion.  Echo (05/22/13) with moderate pericardial effusion, no tamponade.  Echo (06/06/13) with small pericardial effusion, no tamponade.  5. Paroxysmal atrial fibrillation: In setting of acute pericarditis when initially noted.  Was started on Xarelto but this was stopped when he developed a pericardial effusion.  6. Chronic low back pain 7. Venous insufficiency.   SH: Lives  alone at [place].  Nonsmoker.   FH: No premature CAD.   ROS: All systems reviewed and negative except as per HPI.   Current Outpatient Prescriptions  Medication Sig Dispense Refill  . aspirin 325 MG tablet Take 325 mg by mouth.     . finasteride (PROSCAR) 5 MG tablet Take 5 mg by mouth daily. Dose is taken at bedtime    . levothyroxine (SYNTHROID, LEVOTHROID) 88 MCG tablet Take 88 mcg by mouth daily before breakfast.     . metoprolol succinate (TOPROL-XL) 25 MG 24 hr tablet TAKE 1/2 TABLET EVERY DAY 45 tablet 1  . Multiple Vitamins-Iron (MULTIVITAMINS WITH IRON) TABS tablet Take 1 tablet by mouth daily.     No current facility-administered medications for this visit.    BP 122/56 mmHg  Pulse 70  Ht 6' (1.829 m)  Wt 174 lb (78.926 kg)  BMI 23.59 kg/m2 General: NAD Neck: No JVD, no thyromegaly or thyroid nodule.  Lungs: Clear to auscultation bilaterally with normal respiratory effort. CV: Nondisplaced PMI.  Heart regular S1/S2, no S3/S4, 1/6 HSM LLSB, no rub.  1+ edema 3/4 to knees bilaterally.  No carotid bruit.  Normal pedal pulses.  Abdomen: Soft, nontender, no hepatosplenomegaly, no distention.  Skin: Intact without lesions or rashes.  Neurologic: Alert and oriented x 3.  Psych: Normal affect. Extremities: No clubbing or cyanosis.   Assessment/Plan: 1. Acute pericarditis: No recurrent chest pain. 2. Atrial fibrillation: Paroxysmal.  Had only known episode in the setting of acute pericarditis.  Now in NSR.  Xarelto was stopped due to possibility that it was associated with the development of pericardial effusion. He was initially on amiodarone but is off this also.  He is on ASA 81.  I suspect that the atrial fibrillation was triggered by acute pericarditis and hopefully he will stay out of atrial fibrillation long-term.  3. Pericardial effusion: Related to acute pericarditis and possibly to Xarelto use. Off Xarelto.   4. Lower extremity edema: No JVD, BNP normal recently.   Suspect venous insufficiency.  He has a hard time getting on compression stockings but will try them again.  He will cut back on sodium intake.   Followup in 6 months.   Loralie Champagne 10/31/2014

## 2014-11-01 DIAGNOSIS — H903 Sensorineural hearing loss, bilateral: Secondary | ICD-10-CM | POA: Diagnosis not present

## 2014-11-01 DIAGNOSIS — H6521 Chronic serous otitis media, right ear: Secondary | ICD-10-CM | POA: Diagnosis not present

## 2014-11-22 DIAGNOSIS — M47817 Spondylosis without myelopathy or radiculopathy, lumbosacral region: Secondary | ICD-10-CM | POA: Diagnosis not present

## 2014-11-22 DIAGNOSIS — M5136 Other intervertebral disc degeneration, lumbar region: Secondary | ICD-10-CM | POA: Diagnosis not present

## 2014-11-22 DIAGNOSIS — M545 Low back pain: Secondary | ICD-10-CM | POA: Diagnosis not present

## 2014-11-22 DIAGNOSIS — M4806 Spinal stenosis, lumbar region: Secondary | ICD-10-CM | POA: Diagnosis not present

## 2014-11-22 DIAGNOSIS — M47816 Spondylosis without myelopathy or radiculopathy, lumbar region: Secondary | ICD-10-CM | POA: Diagnosis not present

## 2014-11-26 ENCOUNTER — Other Ambulatory Visit: Payer: Self-pay | Admitting: Cardiology

## 2014-12-17 DIAGNOSIS — N210 Calculus in bladder: Secondary | ICD-10-CM | POA: Diagnosis not present

## 2014-12-17 DIAGNOSIS — N138 Other obstructive and reflux uropathy: Secondary | ICD-10-CM | POA: Diagnosis not present

## 2014-12-17 DIAGNOSIS — N401 Enlarged prostate with lower urinary tract symptoms: Secondary | ICD-10-CM | POA: Diagnosis not present

## 2014-12-17 DIAGNOSIS — R351 Nocturia: Secondary | ICD-10-CM | POA: Diagnosis not present

## 2015-01-15 DIAGNOSIS — M5136 Other intervertebral disc degeneration, lumbar region: Secondary | ICD-10-CM | POA: Diagnosis not present

## 2015-01-15 DIAGNOSIS — M4806 Spinal stenosis, lumbar region: Secondary | ICD-10-CM | POA: Diagnosis not present

## 2015-01-15 DIAGNOSIS — Z6825 Body mass index (BMI) 25.0-25.9, adult: Secondary | ICD-10-CM | POA: Diagnosis not present

## 2015-01-15 DIAGNOSIS — M47816 Spondylosis without myelopathy or radiculopathy, lumbar region: Secondary | ICD-10-CM | POA: Diagnosis not present

## 2015-01-15 DIAGNOSIS — M545 Low back pain: Secondary | ICD-10-CM | POA: Diagnosis not present

## 2015-02-07 DIAGNOSIS — M4806 Spinal stenosis, lumbar region: Secondary | ICD-10-CM | POA: Diagnosis not present

## 2015-03-04 ENCOUNTER — Other Ambulatory Visit: Payer: Self-pay

## 2015-04-03 DIAGNOSIS — H2513 Age-related nuclear cataract, bilateral: Secondary | ICD-10-CM | POA: Diagnosis not present

## 2015-04-03 DIAGNOSIS — H5203 Hypermetropia, bilateral: Secondary | ICD-10-CM | POA: Diagnosis not present

## 2015-04-10 DIAGNOSIS — Z85828 Personal history of other malignant neoplasm of skin: Secondary | ICD-10-CM | POA: Diagnosis not present

## 2015-04-10 DIAGNOSIS — L821 Other seborrheic keratosis: Secondary | ICD-10-CM | POA: Diagnosis not present

## 2015-04-10 DIAGNOSIS — Z85820 Personal history of malignant melanoma of skin: Secondary | ICD-10-CM | POA: Diagnosis not present

## 2015-04-10 DIAGNOSIS — D485 Neoplasm of uncertain behavior of skin: Secondary | ICD-10-CM | POA: Diagnosis not present

## 2015-04-10 DIAGNOSIS — L570 Actinic keratosis: Secondary | ICD-10-CM | POA: Diagnosis not present

## 2015-05-09 DIAGNOSIS — H2512 Age-related nuclear cataract, left eye: Secondary | ICD-10-CM | POA: Diagnosis not present

## 2015-05-09 DIAGNOSIS — H25812 Combined forms of age-related cataract, left eye: Secondary | ICD-10-CM | POA: Diagnosis not present

## 2015-05-14 DIAGNOSIS — M545 Low back pain: Secondary | ICD-10-CM | POA: Diagnosis not present

## 2015-05-14 DIAGNOSIS — M5136 Other intervertebral disc degeneration, lumbar region: Secondary | ICD-10-CM | POA: Diagnosis not present

## 2015-05-14 DIAGNOSIS — M4806 Spinal stenosis, lumbar region: Secondary | ICD-10-CM | POA: Diagnosis not present

## 2015-05-14 DIAGNOSIS — M47816 Spondylosis without myelopathy or radiculopathy, lumbar region: Secondary | ICD-10-CM | POA: Diagnosis not present

## 2015-05-16 DIAGNOSIS — E039 Hypothyroidism, unspecified: Secondary | ICD-10-CM | POA: Diagnosis not present

## 2015-05-16 DIAGNOSIS — Z23 Encounter for immunization: Secondary | ICD-10-CM | POA: Diagnosis not present

## 2015-05-16 DIAGNOSIS — G459 Transient cerebral ischemic attack, unspecified: Secondary | ICD-10-CM | POA: Diagnosis not present

## 2015-05-16 DIAGNOSIS — N400 Enlarged prostate without lower urinary tract symptoms: Secondary | ICD-10-CM | POA: Diagnosis not present

## 2015-05-16 DIAGNOSIS — R7301 Impaired fasting glucose: Secondary | ICD-10-CM | POA: Diagnosis not present

## 2015-05-16 DIAGNOSIS — R609 Edema, unspecified: Secondary | ICD-10-CM | POA: Diagnosis not present

## 2015-05-16 DIAGNOSIS — I10 Essential (primary) hypertension: Secondary | ICD-10-CM | POA: Diagnosis not present

## 2015-05-21 DIAGNOSIS — I10 Essential (primary) hypertension: Secondary | ICD-10-CM | POA: Diagnosis not present

## 2015-05-21 DIAGNOSIS — E039 Hypothyroidism, unspecified: Secondary | ICD-10-CM | POA: Diagnosis not present

## 2015-05-21 DIAGNOSIS — R7301 Impaired fasting glucose: Secondary | ICD-10-CM | POA: Diagnosis not present

## 2015-05-21 DIAGNOSIS — G459 Transient cerebral ischemic attack, unspecified: Secondary | ICD-10-CM | POA: Diagnosis not present

## 2015-05-21 DIAGNOSIS — N400 Enlarged prostate without lower urinary tract symptoms: Secondary | ICD-10-CM | POA: Diagnosis not present

## 2015-05-22 ENCOUNTER — Other Ambulatory Visit: Payer: Self-pay | Admitting: Cardiology

## 2015-05-23 DIAGNOSIS — M47816 Spondylosis without myelopathy or radiculopathy, lumbar region: Secondary | ICD-10-CM | POA: Diagnosis not present

## 2015-05-23 DIAGNOSIS — M5136 Other intervertebral disc degeneration, lumbar region: Secondary | ICD-10-CM | POA: Diagnosis not present

## 2015-05-23 DIAGNOSIS — M4806 Spinal stenosis, lumbar region: Secondary | ICD-10-CM | POA: Diagnosis not present

## 2015-05-24 ENCOUNTER — Encounter: Payer: Self-pay | Admitting: Cardiology

## 2015-05-24 ENCOUNTER — Ambulatory Visit (INDEPENDENT_AMBULATORY_CARE_PROVIDER_SITE_OTHER): Payer: Medicare Other | Admitting: Cardiology

## 2015-05-24 VITALS — BP 140/72 | HR 72 | Ht 72.0 in | Wt 184.4 lb

## 2015-05-24 DIAGNOSIS — I313 Pericardial effusion (noninflammatory): Secondary | ICD-10-CM

## 2015-05-24 DIAGNOSIS — I3139 Other pericardial effusion (noninflammatory): Secondary | ICD-10-CM

## 2015-05-24 DIAGNOSIS — I319 Disease of pericardium, unspecified: Secondary | ICD-10-CM | POA: Diagnosis not present

## 2015-05-24 DIAGNOSIS — I480 Paroxysmal atrial fibrillation: Secondary | ICD-10-CM | POA: Diagnosis not present

## 2015-05-24 NOTE — Patient Instructions (Signed)
Medication Instructions:  No changes today  Labwork: None today  Testing/Procedures: None today  Follow-Up: Your physician wants you to follow-up in: 1 year with Dr McLean. (September 2017). You will receive a reminder letter in the mail two months in advance. If you don't receive a letter, please call our office to schedule the follow-up appointment.      

## 2015-05-26 NOTE — Progress Notes (Signed)
Patient ID: Nathaniel [name], male   DOB: 12-Jun-1920, 81 y.o.   MRN: [id number] PCP: Dr. Rex Kras  81 yo with hospitalization in 9/14 for presumed acute pericarditis and atrial fibrillation presents for cardiology followup.  He was admitted in early 9/14 with pleuritic chest pain.  He had diffuse slight ST elevation on ECG and was thought to have acute pericarditis, ?viral pericarditis.  No pericardial effusion initially.  While in the hospital, he went into atrial fibrillation with RVR and was treated with amiodarone.  He converted to NSR and remains in NSR today.  He was started on Xarelto in the hospital.  Followup echo later in 9/14 showed the development of a moderate pericardial effusion without tamponade.  Xarelto was stopped.  Patient had another echo on 06/06/13 that showed a small pericardial effusion (improved).    Since I last saw him, he has not had any more chest pain.  He has not had tachypalpitations.  He is in NSR today.  No dyspnea.  He is limited by knee pain and low back pain.  He uses a walker for balance and because of back pain.  He has chronic lower extremity edema, BNP has been normal.  Weight is up this appointment, but he has not been very active.   Labs (9/14): K 4.7, creatinine 1.0, ESR 44 Labs (10/14): TSH normal, LFTs normal Labs (1/15): LFTs normal, TSH normal Labs (7/15): BNP 49, K 4.1, creatinine 1.0  ECG: NSR, LAFB, PVC, LVH  PMH: 1. Hyperlipidemia 2. Hypothyroidism 3. HTN 4. Acute pericarditis: 9/14.  Echo (05/11/13) with EF 55-60%, normal RV, no pericardial effusion.  Echo (05/22/13) with moderate pericardial effusion, no tamponade.  Echo (06/06/13) with small pericardial effusion, no tamponade.  5. Paroxysmal atrial fibrillation: In setting of acute pericarditis when initially noted.  Was started on Xarelto but this was stopped when he developed a pericardial effusion.  6. Chronic low back pain 7. Venous insufficiency.   SH: Lives alone at [place].  Nonsmoker.    FH: No premature CAD.   ROS: All systems reviewed and negative except as per HPI.   Current Outpatient Prescriptions  Medication Sig Dispense Refill  . aspirin 325 MG tablet Take 325 mg by mouth.     . DUREZOL 0.05 % EMUL Place 1 Dose into the right eye 4 (four) times daily.  0  . finasteride (PROSCAR) 5 MG tablet Take 5 mg by mouth daily. Dose is taken at bedtime    . levothyroxine (SYNTHROID, LEVOTHROID) 88 MCG tablet Take 88 mcg by mouth daily before breakfast.     . metoprolol succinate (TOPROL-XL) 25 MG 24 hr tablet TAKE 1/2 TABLET EVERY DAY 45 tablet 0  . VIGAMOX 0.5 % ophthalmic solution Place 1 drop into the right eye daily.  0   No current facility-administered medications for this visit.    BP 140/72 mmHg  Pulse 72  Ht 6' (1.829 m)  Wt 184 lb 6.4 oz (83.643 kg)  BMI 25.00 kg/m2 General: NAD Neck: No JVD, no thyromegaly or thyroid nodule.  Lungs: Slight crackles at bases bilaterally.  CV: Nondisplaced PMI.  Heart regular S1/S2, no S3/S4, 1/6 HSM LLSB, no rub.  2+ ankle edema bilaterally.  No carotid bruit.  Normal pedal pulses.  Abdomen: Soft, nontender, no hepatosplenomegaly, no distention.  Skin: Intact without lesions or rashes.  Neurologic: Alert and oriented x 3.  Psych: Normal affect. Extremities: No clubbing or cyanosis.   Assessment/Plan: 1. Acute pericarditis: No recurrent chest pain. 2.  Atrial fibrillation: Paroxysmal.  Had only known episode in the setting of acute pericarditis.  Now in NSR.  Xarelto was stopped due to possibility that it was associated with the development of pericardial effusion. He was initially on amiodarone but is off this also.  He is on ASA 81.  I suspect that the atrial fibrillation was triggered by acute pericarditis and hopefully he will stay out of atrial fibrillation long-term.  3. Pericardial effusion: Related to acute pericarditis and possibly to Xarelto use. Off Xarelto.   4. Lower extremity edema: No JVD, BNP normal in the  past.  Suspect venous insufficiency.   Followup in 1 year.   Loralie Champagne 05/26/2015

## 2015-05-28 ENCOUNTER — Encounter: Payer: Self-pay | Admitting: Cardiology

## 2015-05-30 DIAGNOSIS — H2511 Age-related nuclear cataract, right eye: Secondary | ICD-10-CM | POA: Diagnosis not present

## 2015-05-30 DIAGNOSIS — H25811 Combined forms of age-related cataract, right eye: Secondary | ICD-10-CM | POA: Diagnosis not present

## 2015-06-04 DIAGNOSIS — M5136 Other intervertebral disc degeneration, lumbar region: Secondary | ICD-10-CM | POA: Diagnosis not present

## 2015-06-04 DIAGNOSIS — I10 Essential (primary) hypertension: Secondary | ICD-10-CM | POA: Diagnosis not present

## 2015-06-04 DIAGNOSIS — M47816 Spondylosis without myelopathy or radiculopathy, lumbar region: Secondary | ICD-10-CM | POA: Diagnosis not present

## 2015-06-04 DIAGNOSIS — M4310 Spondylolisthesis, site unspecified: Secondary | ICD-10-CM | POA: Diagnosis not present

## 2015-06-04 DIAGNOSIS — M4806 Spinal stenosis, lumbar region: Secondary | ICD-10-CM | POA: Diagnosis not present

## 2015-06-08 HISTORY — PX: CATARACT EXTRACTION W/ INTRAOCULAR LENS IMPLANT: SHX1309

## 2015-07-11 DIAGNOSIS — K649 Unspecified hemorrhoids: Secondary | ICD-10-CM | POA: Diagnosis not present

## 2015-07-12 DIAGNOSIS — K625 Hemorrhage of anus and rectum: Secondary | ICD-10-CM | POA: Diagnosis not present

## 2015-07-16 ENCOUNTER — Other Ambulatory Visit: Payer: Self-pay | Admitting: Gastroenterology

## 2015-07-16 DIAGNOSIS — C20 Malignant neoplasm of rectum: Secondary | ICD-10-CM | POA: Diagnosis not present

## 2015-07-22 ENCOUNTER — Telehealth: Payer: Self-pay | Admitting: *Deleted

## 2015-07-22 DIAGNOSIS — C20 Malignant neoplasm of rectum: Secondary | ICD-10-CM

## 2015-07-22 NOTE — Telephone Encounter (Signed)
Oncology Nurse Navigator Documentation  Oncology Nurse Navigator Flowsheets 07/22/2015  Referral date to RadOnc/MedOnc 07/22/2015  Navigator Encounter Type Introductory phone call  Called patient with appointment to see Dr. Burr Medico on 07/23/15 at 11:30. Provided directions and contact phone #.  Appreciates the quick response. Will make referral to radiation oncology to see, possibly on 07/26/15.

## 2015-07-23 ENCOUNTER — Ambulatory Visit (HOSPITAL_BASED_OUTPATIENT_CLINIC_OR_DEPARTMENT_OTHER): Payer: Medicare Other

## 2015-07-23 ENCOUNTER — Encounter: Payer: Self-pay | Admitting: Hematology

## 2015-07-23 ENCOUNTER — Ambulatory Visit (HOSPITAL_BASED_OUTPATIENT_CLINIC_OR_DEPARTMENT_OTHER): Payer: Medicare Other | Admitting: Hematology

## 2015-07-23 ENCOUNTER — Telehealth: Payer: Self-pay | Admitting: Hematology

## 2015-07-23 VITALS — BP 164/75 | HR 69 | Temp 97.8°F | Resp 20 | Ht 72.0 in | Wt 184.1 lb

## 2015-07-23 DIAGNOSIS — C20 Malignant neoplasm of rectum: Secondary | ICD-10-CM

## 2015-07-23 LAB — COMPREHENSIVE METABOLIC PANEL (CC13)
ALBUMIN: 3.5 g/dL (ref 3.5–5.0)
ALT: 18 U/L (ref 0–55)
ANION GAP: 9 meq/L (ref 3–11)
AST: 22 U/L (ref 5–34)
Alkaline Phosphatase: 90 U/L (ref 40–150)
BILIRUBIN TOTAL: 0.63 mg/dL (ref 0.20–1.20)
BUN: 12.1 mg/dL (ref 7.0–26.0)
CALCIUM: 9.5 mg/dL (ref 8.4–10.4)
CO2: 27 mEq/L (ref 22–29)
CREATININE: 0.8 mg/dL (ref 0.7–1.3)
Chloride: 106 mEq/L (ref 98–109)
EGFR: 75 mL/min/{1.73_m2} — ABNORMAL LOW (ref >90)
Glucose: 97 mg/dl (ref 70–140)
Potassium: 4.4 mEq/L (ref 3.5–5.1)
Sodium: 141 mEq/L (ref 136–145)
TOTAL PROTEIN: 6.4 g/dL (ref 6.4–8.3)

## 2015-07-23 LAB — CBC WITH DIFFERENTIAL/PLATELET
BASO%: 0.9 % (ref 0.0–2.0)
Basophils Absolute: 0.1 10*3/uL (ref 0.0–0.1)
EOS%: 1.2 % (ref 0.0–7.0)
Eosinophils Absolute: 0.1 10*3/uL (ref 0.0–0.5)
HEMATOCRIT: 42.8 % (ref 38.4–49.9)
HEMOGLOBIN: 14 g/dL (ref 13.0–17.1)
LYMPH#: 1.4 10*3/uL (ref 0.9–3.3)
LYMPH%: 21.8 % (ref 14.0–49.0)
MCH: 30.7 pg (ref 27.2–33.4)
MCHC: 32.7 g/dL (ref 32.0–36.0)
MCV: 93.9 fL (ref 79.3–98.0)
MONO#: 0.7 10*3/uL (ref 0.1–0.9)
MONO%: 11.6 % (ref 0.0–14.0)
NEUT%: 64.5 % (ref 39.0–75.0)
NEUTROS ABS: 4.1 10*3/uL (ref 1.5–6.5)
PLATELETS: 212 10*3/uL (ref 140–400)
RBC: 4.56 10*6/uL (ref 4.20–5.82)
RDW: 14.4 % (ref 11.0–14.6)
WBC: 6.4 10*3/uL (ref 4.0–10.3)

## 2015-07-23 NOTE — Progress Notes (Signed)
[facility]  [phone]) [phone] [phone]  Clinic New Consult Note   Patient Care Team: Catha Gosselin, MD as PCP - General (Family Medicine) 07/23/2015  Referring physician: Dr. Matthias Hughs   CHIEF COMPLAINTS/PURPOSE OF CONSULTATION:  Newly diagnosed rectal cancer  HISTORY OF PRESENTING ILLNESS:  Nathaniel [name] 81 y.o. male is here because of recently diagnosed rectal cancer. He presents to the clinic by himself.  He has had abnormal bowel movement for the past 2-3 months. It's usually very small amount, loose, every time he urinates, he has a such a small bowel movement. He does not really have a good normal bowel movement. He also reports frequent stool stains on his underwear. He noticed small amount blood in his stool recently, which prompt his seeing GI Dr. Matthias Hughs on. He underwent a flexible sigmoidoscopy in the office, which showed a rectal mass, biopsy reviewed adenocarcinoma. He denies significant rectal pain, abdominal discomfort, or other new complaints.  He lives in a independent living facility, he is able to take care of his all ADLs, and does some light housework, shopping, by himself. He uses a walker, still drives. He does get fatigued after activity, his energy level has seemed to be decreasing in over the past few years. No significant change daily. He has decent appetite and eats well.  He had remote history of ulcerative colitis, which has been in remission. 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  He is widowed, has 5 children, who all live in out states. He does have several stepchildren from his secondary to, and some of them live in [place].   MEDICAL HISTORY:  Past Medical History  Diagnosis Date  . Thyroid disease   . Hypercholesteremia   . HTN (hypertension)   . Hypothyroidism     SURGICAL HISTORY: Past Surgical History  Procedure Laterality Date  . Shoulder  surgery Right 2013    rotator cuff   . Knee surgery Left 75 years ago  . Ankle fracture surgery Left 81 years ago  . Hernia repair  28 years ago    SOCIAL HISTORY: Social History   Social History  . Marital Status: Widowed     Spouse Name: N/A  . Number of Children: 4 daughters and one son, and 5 stepchildren   . Years of Education: N/A   Occupational History  . 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 Smoking status: Never Smoker   . Smokeless tobacco: Not on file  . Alcohol Use: 0.6 oz/week    1 Glasses of wine per week     Comment: 1 glass of wine  . Drug Use: No  . Sexual Activity: Not on file   Other Topics Concern  . Not on file   Social History Narrative    FAMILY HISTORY: History reviewed. No pertinent family history.  ALLERGIES:  is allergic to cinnamon; ibuprofen; percocet; protonix; and xarelto.  MEDICATIONS:  Current Outpatient Prescriptions  Medication Sig Dispense Refill  . aspirin 325 MG tablet Take 325 mg by mouth.     . finasteride (PROSCAR) 5 MG tablet Take 5 mg by mouth daily. Dose is taken at bedtime    . levothyroxine (SYNTHROID, LEVOTHROID) 88 MCG tablet Take 88 mcg by mouth daily before breakfast.     . metoprolol succinate (TOPROL-XL) 25 MG 24 hr tablet TAKE 1/2 TABLET EVERY DAY 45 tablet 0  No current facility-administered medications for this visit.    REVIEW OF SYSTEMS:   Constitutional: Denies fevers, chills or abnormal night sweats Eyes: Denies blurriness of vision, double vision or watery eyes Ears, nose, mouth, throat, and face: Denies mucositis or sore throat Respiratory: Denies cough, dyspnea or wheezes Cardiovascular: Denies palpitation, chest discomfort or lower extremity swelling Gastrointestinal:  Denies nausea, heartburn or change in bowel habits Skin: Denies abnormal skin rashes Lymphatics: Denies new lymphadenopathy or easy bruising Neurological:Denies numbness, tingling or new  weaknesses Behavioral/Psych: Mood is stable, no new changes  All other systems were reviewed with the patient and are negative.  PHYSICAL EXAMINATION: ECOG PERFORMANCE STATUS: 1 - Symptomatic but completely ambulatory  Filed Vitals:   07/23/15 1134  BP: 164/75  Pulse: 69  Temp: 97.8 F (36.6 C)  Resp: 20   Filed Weights   07/23/15 1134  Weight: 184 lb 1.6 oz (83.507 kg)    GENERAL:alert, no distress and comfortable SKIN: skin color, texture, turgor are normal, no rashes or significant lesions EYES: normal, conjunctiva are pink and non-injected, sclera clear OROPHARYNX:no exudate, no erythema and lips, buccal mucosa, and tongue normal  NECK: supple, thyroid normal size, non-tender, without nodularity LYMPH:  no palpable lymphadenopathy in the cervical, axillary or inguinal LUNGS: clear to auscultation and percussion with normal breathing effort HEART: regular rate & rhythm and no murmurs and no lower extremity edema ABDOMEN:abdomen soft, non-tender and normal bowel sounds. Rectal exam revealed an anterior low rectal mass, non-tender, no blood on glove.  Musculoskeletal:no cyanosis of digits and no clubbing  PSYCH: alert & oriented x 3 with fluent speech NEURO: no focal motor/sensory deficits  LABORATORY DATA:  I have reviewed the data as listed Lab Results  Component Value Date   WBC 6.0 03/15/2014   HGB 14.4 03/15/2014   HCT 43.1 03/15/2014   MCV 94.6 03/15/2014   PLT 212.0 03/15/2014   No results for input(s): NA, K, CL, CO2, GLUCOSE, BUN, CREATININE, CALCIUM, GFRNONAA, GFRAA, PROT, ALBUMIN, AST, ALT, ALKPHOS, BILITOT, BILIDIR, IBILI in the last 8760 hours.  PATHOLOGY REPORT  Diagnosis 07/16/2015  Rectum, biopsy, mass - INVASIVE ADENOCARCINOMA WITH SIGNET RING CELL FEATURES. - LYMPHOVASCULAR INVASION IS IDENTIFIED. - SEE COMMENT. Microscopic Comment To help evaluate the case, immunohistochemistry stains were performed. The malignant cells are positive for CDX2  and cytokeratin AE1/AE3. There is focal staining for CD56, a neuroendocrine marker. Prostein and S-100 stains are negative. Overall, the features are consistent with invasive adenocarcinoma of colonic origin, with signet ring cell features. The case was discussed with Dr. Cristina Gong on 07/18/15. (JBK:ds 07/18/15)   RADIOGRAPHIC STUDIES: I have personally reviewed the radiological images as listed and agreed with the findings in the report. No results found.   Flexible  Sigmoidoscopy 07/16/2015 by Dr. Cristina Gong  impression , an infiltrative no obstructing large mass was found in the distal rectum. The mass was partially circumferential (involving one third of the lumen circumference ). The mass measured 5 cm in length.  Biopsy was obtained.       ASSESSMENT & PLAN:  81 year old Caucasian male, with past medical history of ulcerative colitis, hypertension and hypothyroidism, who lives in a independent living, presented with bowel habits change in the mild rectal bleeding.  1. Low rectal adenocarcinoma -I reviewed his sigmoidoscopy findings and the biopsy results in great detail with him. -I recommend him to have a staging PET CT scan to evaluate lymph nodes and distant metastasis. -Due to his advanced age, and some comorbidities, I'm  not sure if he is a candidate for rectal surgery, if he does not have metastatic disease. I'll present his case in our tumor board to get a surgeon's opinion. -If PET scan is negative for distant metastasis, we probably will offer radiation with or without chemotherapy. I'll repeat his lab test today, if he has good liver and renal function, I may consider low-dose capecitabine with concurrent radiation. -We discussed that chemotherapy and radiation alone has small chance of cure his rectal cancer, and most people would need surgery after chemoradiation to cure the cancer. If he is not a candidate for surgery, then our treatment goal is likely to be palliative, to  prevent cancer-related symptoms and prolong his life. -Alternatively, due to his advanced age, palliative approach with radiation alone would be also reasonable.  -If he does have metastatic disease on the PET scan, then our approach would be palliative therapy alone, I would not offer him cytotoxic chemotherapy. I may consider testing MMR to see if he is a candidate for immunotherapy in the metastatic setting.   Plan -PET scan for staging -He is currently receiving radiation oncologist Dr. Lisbeth Renshaw this Thursday [facility] discuss with our colorectal surgeons in our tumor board tomorrow -I will see him back in 2 weeks after his PET scan   All questions were answered. The patient knows to call the clinic with any problems, questions or concerns. I spent 55 minutes counseling the patient face to face. The total time spent in the appointment was 60 minutes and more than 50% was on counseling.     [name], MD 07/23/2015 12:07 PM

## 2015-07-23 NOTE — Telephone Encounter (Signed)
per pof to sch ptappt-adv pt [facility] will call to [facility] scan-gave pt copy of avs-sent back to lab

## 2015-07-24 ENCOUNTER — Encounter: Payer: Self-pay | Admitting: *Deleted

## 2015-07-24 ENCOUNTER — Telehealth: Payer: Self-pay | Admitting: *Deleted

## 2015-07-24 LAB — CEA: CEA: 2.8 ng/mL (ref 0.0–5.0)

## 2015-07-24 NOTE — Progress Notes (Signed)
Oncology Nurse Navigator Documentation  Oncology Nurse Navigator Flowsheets 07/23/2015  Referral date to RadOnc/MedOnc -  Navigator Encounter Type Initial MedOnc  Patient Visit Type Medonc  Treatment Phase Treatment planning  Barriers/Navigation Needs Family concerns;Education  Education Understanding Cancer/ Treatment Options;Newly Diagnosed Cancer Education  Interventions Education Method  Coordination of Care MD Appointments--scheduled w/Dr. Lisbeth Renshaw in clinic Friday and Dr. Marcello Moores  Education Method Verbal;Written;Teach-back  Support Groups/Services Radiographer, therapeutic Kit  Time Spent with Patient 60--escorted him to scheduler and lab after his visit  Met with patient during new patient visit. Explained the role of the GI Nurse Navigator and provided New Patient Packet with information on: 1. Colorectal cancer 2. Support groups 3. Advanced Directives 4. Fall Safety Plan Answered questions, reviewed current treatment plan using TEACH back and provided emotional support. Provided copy of current treatment plan. Provided information on PET scan test and upcoming appointments. Lives at [place] in independent apartment and still drives, cooks and goes to water aerobics 3/week. 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 Is a widower X 2. Has #5 stepchildren and has #4 daughters and #2 sons. Has not had any recent falls, but uses a rolling walker, cane at times and for long distances at home will use motorized w/c. Having some mild fecal incontinence at times-he will see physical therapist on Friday to address this. Added case to GI conference for 07/24/15.  Merceda Elks, RN, BSN GI Oncology [place]

## 2015-07-24 NOTE — Telephone Encounter (Signed)
Oncology Nurse Navigator Documentation  Oncology Nurse Navigator Flowsheets 07/24/2015  Referral date to RadOnc/MedOnc -  Navigator Encounter Type Telephone  Interventions Coordination of Care  Coordination of Care MD Appointments-made patient aware that Dr. Marcello Moores will see him in clinic on Friday after Dr. Lisbeth Renshaw after case was reviewed in GI Conference today.

## 2015-07-26 ENCOUNTER — Encounter: Payer: Self-pay | Admitting: *Deleted

## 2015-07-26 ENCOUNTER — Ambulatory Visit: Payer: Medicare Other | Attending: Hematology | Admitting: Physical Therapy

## 2015-07-26 ENCOUNTER — Ambulatory Visit
Admission: RE | Admit: 2015-07-26 | Discharge: 2015-07-26 | Disposition: A | Discharge status: Home / Self Care | Payer: Medicare Other | Source: Ambulatory Visit | Attending: Radiation Oncology | Admitting: Radiation Oncology

## 2015-07-26 ENCOUNTER — Ambulatory Visit: Payer: Medicare Other

## 2015-07-26 ENCOUNTER — Other Ambulatory Visit: Payer: Self-pay | Admitting: Hematology

## 2015-07-26 ENCOUNTER — Ambulatory Visit: Payer: Medicare Other | Admitting: Nutrition

## 2015-07-26 ENCOUNTER — Encounter: Payer: Self-pay | Admitting: Radiation Oncology

## 2015-07-26 VITALS — BP 166/73 | HR 64 | Temp 97.7°F | Resp 18 | Ht 72.0 in | Wt 186.3 lb

## 2015-07-26 DIAGNOSIS — E079 Disorder of thyroid, unspecified: Secondary | ICD-10-CM | POA: Insufficient documentation

## 2015-07-26 DIAGNOSIS — C20 Malignant neoplasm of rectum: Secondary | ICD-10-CM | POA: Diagnosis not present

## 2015-07-26 DIAGNOSIS — Z7982 Long term (current) use of aspirin: Secondary | ICD-10-CM | POA: Insufficient documentation

## 2015-07-26 DIAGNOSIS — Z510 Encounter for antineoplastic radiation therapy: Secondary | ICD-10-CM | POA: Insufficient documentation

## 2015-07-26 DIAGNOSIS — E7800 Pure hypercholesterolemia, unspecified: Secondary | ICD-10-CM | POA: Insufficient documentation

## 2015-07-26 DIAGNOSIS — E039 Hypothyroidism, unspecified: Secondary | ICD-10-CM | POA: Insufficient documentation

## 2015-07-26 DIAGNOSIS — I10 Essential (primary) hypertension: Secondary | ICD-10-CM | POA: Insufficient documentation

## 2015-07-26 DIAGNOSIS — F1010 Alcohol abuse, uncomplicated: Secondary | ICD-10-CM | POA: Insufficient documentation

## 2015-07-26 MED ORDER — CAPECITABINE 500 MG PO TABS: 825.0000 mg/m2 | ORAL_TABLET | Freq: Two times a day (BID) | ORAL | Status: DC

## 2015-07-26 NOTE — Patient Instructions (Addendum)
Skin Care and Bowel Hygiene  Anyone who has frequent bowel movements, diarrhea, or bowel leakage (fecal incontinence) may experience soreness or skin irritation around the anal region.  Occasionally, the skin can become so inflamed that it breaks into open sores.  Prevent skin breakdown by following good skin care habits.  Cleaning and Washing Techniques After having a bowel movement, men and women should tighten their anal sphincter before wiping.  Women should always wipe from front to back to prevent fecal matter from getting into the urethra and vagina.   Tips for Cleaning and Washing . wipe from front to back towards the anus . always wipe gently with soft toilet paper, or ideally with moist toilet paper . wipe only once with each piece of toilet paper so as not to re-contaminate the area . wash in warm water alone or with a minimal amount of mild, fragrance-free soap . use non-biological washing powder . gently pat skin completely dry, avoiding rubbing . if drying the skin after washing is difficult or uncomfortable, try using a hairdryer on a low setting (use very carefully) . allow air to get to the irritated area for some part of every day . use protective skin creams containing zinc as recommended by your doctor  What To Avoid . baths with extra-hot water . soaking for long periods of time in the bathtub . disinfectants and antiseptics  . bath oils, bath salts, and talcum powder . using plastic pants, pads, and sheets, which cause sweating . scratching at the irritated area  Additional Tips . some people find that citrus and acidic foods cause or worsen skin irritation . eat a healthy, balanced diet that is high in fiber  . drink plenty of fluids . wear cotton underwear to allow the skin to breath . talk to your healthcare provider about further treatment options; persistent problems need medical attention  Earlie Counts, PT [place], [place] [address]      [phone]  Toileting Techniques for Bowel Movements (Defecation) Using your belly (abdomen) and pelvic floor muscles to have a bowel movement is usually instinctive.  Sometimes people can have problems with these muscles and have to relearn proper defecation (emptying) techniques.  If you have weakness in your muscles, organs that are falling out, decreased sensation in your pelvis, or ignore your urge to go, you may find yourself straining to have a bowel movement.  You are straining if you are: . holding your breath or taking in a huge gulp of air and holding it  . keeping your lips and jaw tensed and closed tightly . turning red in the face because of excessive pushing or forcing . developing or worsening your  hemorrhoids . getting faint while pushing . not emptying completely and have to defecate many times a day  If you are straining, you are actually making it harder for yourself to have a bowel movement.  Many people find they are pulling up with the pelvic floor muscles and closing off instead of opening the anus. Due to lack pelvic floor relaxation and coordination the abdominal muscles, one has to work harder to push the feces out.  Many people have never been taught how to defecate efficiently and effectively.  Notice what happens to your body when you are having a bowel movement.  While you are sitting on the toilet pay attention to the following areas: . Jaw and mouth position . Angle of your hips   . Whether your feet  touch the ground or not . Arm placement  . Spine position . Waist . Belly tension . Anus (opening of the anal canal)  An Evacuation/Defecation Plan   Here are the 4 basic points:  1. Lean forward enough for your elbows to rest on your knees 2. Support your feet on the floor or use a low stool if your feet don't touch the floor  3. Push out your belly as if you have swallowed a beach ball-you should feel a widening of your waist 4. Open and relax your  pelvic floor muscles, rather than tightening around the anus      The following conditions my require modifications to your toileting posture:  . If you have had surgery in the past that limits your back, hip, pelvic, knee or ankle flexibility . Constipation   Your healthcare practitioner may make the following additional suggestions and adjustments:  1) Sit on the toilet  a) Make sure your feet are supported. b) Notice your hip angle and spine position-most people find it effective to lean forward or raise their knees, which can help the muscles around the anus to relax  c) When you lean forward, place your forearms on your thighs for support  2) Relax suggestions a) Breath deeply in through your nose and out slowly through your mouth as if you are smelling the flowers and blowing out the candles. b) To become aware of how to relax your muscles, contracting and releasing muscles can be helpful.  Pull your pelvic floor muscles in tightly by using the image of holding back gas, or closing around the anus (visualize making a circle smaller) and lifting the anus up and in.  Then release the muscles and your anus should drop down and feel open. Repeat 5 times ending with the feeling of relaxation. c) Keep your pelvic floor muscles relaxed; let your belly bulge out. d) The digestive tract starts at the mouth and ends at the anal opening, so be sure to relax both ends of the tube.  Place your tongue on the roof of your mouth with your teeth separated.  This helps relax your mouth and will help to relax the anus at the same time.  3) Empty (defecation) a) Keep your pelvic floor and sphincter relaxed, then bulge your anal muscles.  Make the anal opening wide.  b) Stick your belly out as if you have swallowed a beach ball. c) Make your belly wall hard using your belly muscles while continuing to breathe. Doing this makes it easier to open your anus. d) Breath out and give a grunt (or try using  other sounds such as ahhhh, shhhhh, ohhhh or grrrrrrr).  4) Finish a) As you finish your bowel movement, pull the pelvic floor muscles up and in.  This will leave your anus in the proper place rather than remaining pushed out and down. If you leave your anus pushed out and down, it will start to feel as though that is normal and give you incorrect signals about needing to have a bowel movement.    Earlie Counts, PT [facility] [address] [place], [place] [address]  Pelvic Floor Exercises for Bowel Control Exercises using both the external anal sphincter and the deep pelvic floor muscles can help you to improve your bowel control. When done correctly, these exercises can tone and strengthen the muscles to help you hold back gas and prevent fecal incontinence (leakage of stool). Exercise programs take time;  you may not see any noticeable change in your bowel control immediately.  In some cases it may take several months to regain control. Bowel Control Muscles The anus and the anal canal, has rings of muscle around it. The outer ring of muscle is called the external anal sphincter; it is a voluntary muscle which you can learn to tighten and close more efficiently. When you contract it you will feel the skin around your anus tighten and pull in as if the anus is winking. Try to keep the buttocks muscles relaxed. The inner ring around the anus is the internal anal sphincter. It is an involuntary and automatic muscle; you don't have to think to keep it closed or open.  This muscle should be closed at all times, except when you are actually trying to have a bowel movement.  In addition to the sphincter muscles, there are deeper muscles called the levator ani that form a sling from your tailbone to your pubic bone. The levator ani muscle has a specific part called the puborectalis that holds stool in until you give the signal to relax and empty.  When you contract these  muscles it creates a feeling of lifting the anus inward.   External Anal Sphincter     Levator ani deep layer   Effective Exercises for Control of Gas and Bowels . Identify the specific areas of the pelvic floor muscles you need to use.  This can be done using a mirror to see if you are contracting the correct muscles or by placing the pad of your finger at or just inside the anal opening. . Develop an exercise plan for strength, endurance and quick response of the muscles and stick with it.  You must make the muscles do more than they are used to doing. . Incorporate the exercises into your daily activities.   Tips for Energy Conservation for Activities of Daily Living . Plan ahead to avoid rushing. . Sit down to bathe and dry off. Wear a terry robe instead of drying off. . Use a shower/bath organizer to decrease leaning and reaching. . Use extension handles on sponges and brushes. Susa Simmonds grab rails in the bathroom or use an elevated toilet seat. Hoyle Barr out clothes and toiletries before dressing. . Minimize leaning over to put on clothes and shoes. Bring your foot to your knee to apply socks and shoes. . Wear comfortable shoes and low-heeled, slip on shoes. Wear button front shirts rather than pullovers. Housekeeping . Schedule household tasks throughout the week. . Do housework sitting down when possible. . Delegate heavy housework, shopping, laundry and child care when possible. . Drag or slide objects rather than lifting. . Sit when ironing and take rest periods. . Stop working before becoming overly tired. Shopping . Organize list by aisle. . Use a grocery cart for support. Marland Kitchen Shop at less busy times. . Ask for help with getting to the car. Meal Preparation . Use convenience and easy-to-prepare foods. . Use small appliances that take less effort to use. Marland Kitchen Prepare meals sitting down. . Soak dishes instead of scrubbing and let dishes air dry. . Prepare double portions and  freeze half. Child Care . Plan activities that can be done sitting down, such as drawing pictures, playing games, reading, and computer games. . Encourage children to climb up onto your lap or into the highchair instead of being lifted. . Make a game of the household chores so that children will want to help. Marland Kitchen  Delegate child care when possible.  Earlie Counts, PT, [facility]; [address] [place], [place], [place] [address]

## 2015-07-26 NOTE — Therapy (Signed)
[facility] [address] [place], [place], [address] Phone: [phone]   Fax:  [phone]  Physical Therapy Evaluation  Patient Details  Name: Nathaniel [name] MRN: [id number] Date of Birth: 02-27-1920 Referring Provider: Dr. Burr Medico  Encounter Date: 07/26/2015      PT End of Session - 07/26/15 1111    Visit Number 1   PT Start Time [id number]   PT Stop Time 1141   PT Time Calculation (min) 13 min   Activity Tolerance Patient tolerated treatment well   Behavior During Therapy [facility] for tasks assessed/performed      Past Medical History  Diagnosis Date  . Thyroid disease   . Hypercholesteremia   . HTN (hypertension)   . Hypothyroidism     Past Surgical History  Procedure Laterality Date  . Shoulder surgery Right 2013    rotator cuff   . Knee surgery Left 73 years ago  . Ankle fracture surgery Left 81 years ago  . Hernia repair  28 years ago    There were no vitals filed for this visit.  Visit Diagnosis:  Rectal cancer [facility]) - Plan: PT plan of care cert/re-cert      Subjective Assessment - 07/26/15 1108    Subjective Patient attending GI clinic   Patient Stated Goals understand ways to manage symptoms from treatment            [facility] PT Assessment - 07/26/15 0001    Assessment   Medical Diagnosis Rectal cancer   Referring Provider Dr. Burr Medico   Onset Date/Surgical Date 07/16/15   Prior Therapy None   Precautions   Precautions Other (comment)   Precaution Comments Cancer precautions   Balance Screen   Has the patient fallen in the past 6 months No   Has the patient had a decrease in activity level because of a fear of falling?  No   Is the patient reluctant to leave their home because of a fear of falling?  No   Home Environment   Living Environment Assisted living  [facility]   Prior Function   Level of Independence Independent   Cognition   Overall Cognitive Status Within Functional Limits  for tasks assessed   Observation/Other Assessments   Focus on Therapeutic Outcomes (FOTO)  Therapist discretion   AROM   Overall AROM Comments lumbar ROM limited by 25%                           PT Education - 07/26/15 1111    Education provided Yes   Education Details toileting technique, ways to conserve energy, skin care, pelvic floor exercise   Person(s) Educated Patient   Methods Explanation;Demonstration;Handout   Comprehension Returned demonstration;Verbalized understanding             PT Long Term Goals - 07/26/15 1112    PT LONG TERM GOAL #1   Title understand ways to  conserve energy after treatment   Time 1   Period Days   Status Achieved   PT LONG TERM GOAL #2   Title understand skin care after radiation and having a bowel movement   Time 1   Period Days   Status Achieved   PT LONG TERM GOAL #3   Title understand ways to conserve energy    Time 1   Period Days   Status Achieved  Plan - 17-Aug-2015 1113    Clinical Impression Statement Patient is a 81 year old male with diagnosis of rectal cancer on 07/16/2015 by flex sigmoidoscopy.  Patient is attending GI clinic.  Patient reports no pain.  Lumbar ROM limited by 25%.  Patient will benefit from physical therapy to understand ways to treat symptoms after treatment.    Pt will benefit from skilled therapeutic intervention in order to improve on the following deficits Other (comment)  education   Rehab Potential Good   Clinical Impairments Affecting Rehab Potential None   PT Frequency 1x / week   PT Duration --  1 time in GI clinic   PT Treatment/Interventions Patient/family education   PT Next Visit Plan attending 1 time visit in GI clinc   PT Home Exercise Plan Current HEP   Consulted and Agree with Plan of Care Patient          G-Codes - August 17, 2015 1115    Functional Assessment Tool Used therapist discretion   Functional Limitation Other PT primary   Other PT  Primary Current Status [id number]) 0 percent impaired, limited or restricted   Other PT Primary Goal Status [id number]) 0 percent impaired, limited or restricted   Other PT Primary Discharge Status [id number]) 0 percent impaired, limited or restricted       Problem List Patient Active Problem List   Diagnosis Date Noted  . Rectal cancer ([place]) 07/23/2015  . Acute pericarditis 06/08/2013  . Pericardial effusion 05/23/2013  . Near syncope 05/22/2013  . Diarrhea 05/22/2013  . Orthostatic hypotension 05/22/2013  . Atrial fibrillation ([place]) 05/12/2013  . Chest pain 05/11/2013  . HTN (hypertension) 05/11/2013    Senai Ramnath,PT 08-17-2015, 11:44 AM  [facility] [address] [place], [place], [address] Phone: [phone]   Fax:  [phone]  Name: Nathaniel [name] MRN: [id number] Date of Birth: May 18, 1920

## 2015-07-26 NOTE — Progress Notes (Signed)
[facility] Psychosocial Distress Screening Clinical Social Work  Clinical Social Work met with pt at [facility] to introduce self, explain role of CSW/Pt and Family Support Team and review distress screening protocol.  The patient scored a 3 on the Psychosocial Distress Thermometer which indicates very mild distress. Clinical Social Worker met with pt alone to further assess for distress and other psychosocial needs. Pt lives in [place] in an apartment and is independent in his ADLs. Pt reports to have extended family that are supportive and he plans to share information with them about his illness next week when they are all together. Pt shared he is aware of all the supports at [facility] and will share his diagnosis and tx plan with Rosendo Gros, [place] at [place] as well. CSW reviewed options for coping, support group, counseling, etc. Pt may attend group as he has had good experiences with groups in the past. He feels ok with his plan currently and denies needs. He agrees to reach out as needed.   ONCBCN DISTRESS SCREENING 07/26/2015  Screening Type Initial Screening  Distress experienced in past week (1-10) 3  Emotional problem type Adjusting to illness;Boredom  Information Concerns Type Lack of info about treatment  Physical Problem type Constipation/diarrhea  Physician notified of physical symptoms Yes  Referral to clinical social work Yes  Referral to dietition Yes  Referral to financial advocate No  Referral to support programs Yes     Clinical Social Worker follow up needed: No.  If yes, follow up plan:  Loren Racer, [place]  [facility] Phone: [phone] Fax: [phone]

## 2015-07-26 NOTE — Progress Notes (Signed)
Radiation Oncology         [phone] ________________________________  Name: Nathaniel [name] MRN: [id number]  Date: 07/26/2015  DOB: 20-Nov-1919  [id number] Marigene Ehlers, MD  [name], MD     REFERRING PHYSICIAN: [name], MD   DIAGNOSIS: The encounter diagnosis was Rectal cancer [facility]).   HISTORY OF PRESENT ILLNESS::Nathaniel [name] is a 81 y.o. male who is seen for an initial consultation visit regarding the patient's diagnosis of rectal cancer.  The patient presented with initial symptoms of rectal leakage and bleeding on toilet paper when he wipes. These symptoms began 2-3 months ago.  He denies pelvic pain, constipation, and weight loss. He has a PET scan scheduled 08/07/15.    Endoscopy/ colonoscopy has been performed. A rectal mass was noted. A complete colonoscopy beyond the tumor was not performed beyond a sigmoidoscopy. A biospy was performed. This returned positive for adenocarcinoma.  An endoscopic ultrasound has been performed. The distance from the anal verge was distal rectum. The length of the tumor was 5 cm. The tumor was described as non-circumferential and bleeding.   PET scan has been ordered to look at further imaging. Dr. Marcello Moores may also order a pelvic MRI scan.  PREVIOUS RADIATION THERAPY: No   PAST MEDICAL HISTORY:  has a past medical history of Thyroid disease; Hypercholesteremia; HTN (hypertension); and Hypothyroidism.     PAST SURGICAL HISTORY: Past Surgical History  Procedure Laterality Date  . Shoulder surgery Right 2013    rotator cuff   . Knee surgery Left 73 years ago  . Ankle fracture surgery Left 79 years ago  . Hernia repair  28 years ago     FAMILY HISTORY: family history includes Stroke in his father.   SOCIAL HISTORY:  reports that he has never smoked. He does not have any smokeless tobacco history on file. He reports that he drinks about 0.6 oz of alcohol per week. He reports that he does not use illicit  drugs.   ALLERGIES: Cinnamon; Ibuprofen; Percocet; Protonix; and Xarelto   MEDICATIONS:  Current Outpatient Prescriptions  Medication Sig Dispense Refill  . aspirin 325 MG tablet Take 325 mg by mouth.     . finasteride (PROSCAR) 5 MG tablet Take 5 mg by mouth daily. Dose is taken at bedtime    . levothyroxine (SYNTHROID, LEVOTHROID) 88 MCG tablet Take 88 mcg by mouth daily before breakfast.     . metoprolol succinate (TOPROL-XL) 25 MG 24 hr tablet TAKE 1/2 TABLET EVERY DAY 45 tablet 0   No current facility-administered medications for this encounter.     REVIEW OF SYSTEMS:  A 15 point review of systems is documented in the electronic medical record. This was obtained by the nursing staff. However, I reviewed this with the patient to discuss relevant findings and make appropriate changes.  Pertinent items are noted in HPI.    PHYSICAL EXAM:  height is 6' (1.829 m) and weight is 186 lb 4.8 oz (84.505 kg). His oral temperature is 97.7 F (36.5 C). His blood pressure is 166/73 and his pulse is 64. His respiration is 18 and oxygen saturation is 99%.   ECOG = 1  0 - Asymptomatic (Fully active, able to carry on all predisease activities without restriction)  1 - Symptomatic but completely ambulatory (Restricted in physically strenuous activity but ambulatory and able to carry out work of a light or sedentary nature. For example, light housework, office work)  2 - Symptomatic, <50% in bed during the day (Ambulatory  and capable of all self care but unable to carry out any work activities. Up and about more than 50% of waking hours)  3 - Symptomatic, >50% in bed, but not bedbound (Capable of only limited self-care, confined to bed or chair 50% or more of waking hours)  4 - Bedbound (Completely disabled. Cannot carry on any self-care. Totally confined to bed or chair)  5 - Death   Eustace Pen MM, Creech RH, Tormey DC, et al. [phone]). "Toxicity and response criteria of the [facility]". [place] Oncol. 5 (6): 649-55  General: Well-developed, in no acute distress HEENT: Normocephalic, atraumatic; oral cavity clear Neck: Supple without any lymphadenopathy Cardiovascular: Regular rate and rhythm Respiratory: Clear to auscultation bilaterally GI: Soft, nontender, normal bowel sounds Extremities: No edema present Neuro: No focal deficits Rectal:  Rectal tumor was palpable anteriorly beginning close to the anal sphincter. Bleeding was not present. The degree of narrowing was mild to moderate.    LABORATORY DATA:  Lab Results  Component Value Date   WBC 6.4 07/23/2015   HGB 14.0 07/23/2015   HCT 42.8 07/23/2015   MCV 93.9 07/23/2015   PLT 212 07/23/2015   Lab Results  Component Value Date   NA 141 07/23/2015   K 4.4 07/23/2015   CL 102 03/15/2014   CO2 27 07/23/2015   Lab Results  Component Value Date   ALT 18 07/23/2015   AST 22 07/23/2015   ALKPHOS 90 07/23/2015   BILITOT 0.63 07/23/2015    CEA: 2.8    RADIOGRAPHY: No results found.     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   I discussed with the patient the rationale of radiation treatment in this setting. I discussed the benefit in terms of local/regional control and we also discussed how this can aid surgical resection. We also discussed the potential side effects and risks of treatment as well.   All of the patient's questions were answered. The patient wishes to proceed with radiation treatment.   PLAN: The patient will proceed with a simulation in the near future such that we can proceed with treatment planning. I anticipate treating the patient to 50.4 Gy in 5 1/2 weeks. This will correspond to a 3-D conformal technique with daily  optical guidance and weekly port films to help ensure accurate localization of the target volume. I anticipate beginning this treatment potentially on 08/12/15. The patient also is seeing medical oncology and concurrent chemotherapy will be coordinated.    Pending issues:  Pending PET scan for 08/07/15. Full colonoscopy is also planned.  He will be scheduled for a planning appointment early next week.    ________________________________   Jodelle Gross, MD, PhD   **Disclaimer: This note was dictated with voice recognition software. Similar sounding words can inadvertently be transcribed and this note may contain transcription errors which may not have been corrected upon publication of note.**    This document serves as a record of services personally performed by Kyung Rudd, MD. It was created on his behalf by  Lendon Collar, a trained medical scribe. The creation of this record is based on the scribe's personal observations and the provider's statements to them. This document has been checked and approved by the attending provider.

## 2015-07-26 NOTE — Progress Notes (Signed)
Oncology Nurse Navigator Documentation  Oncology Nurse Navigator Flowsheets 07/26/2015  Referral date to RadOnc/MedOnc -  Navigator Encounter Type Clinic/MDC  Patient Visit Type Radonc;Surgery  Treatment Phase Treatment planning  Barriers/Navigation Needs Family concerns  Education Understanding Cancer/ Treatment Options;Newly Diagnosed Cancer Education;Preparing for Upcoming Surgery/ Treatment  Interventions Coordination of Care;Education Method  Coordination of Care Radiology--PET scan scheduled for 11/29 at 1230/1300 (patient aware)  Education Method Verbal;Written;Teach-back  Support Groups/Services GI  Time Spent with Patient 52  Also seen today by CSW, Nutrition and PT. Reviewed current treatment plan and gave him a copy. He has a very good understanding of the plan. He was made aware of his simulation appointment on 07/30/15 and will go to chemo class afterwards.

## 2015-07-26 NOTE — Progress Notes (Signed)
Patient was seen in [facility].  81 year old male diagnosed with Rectal Cancer.  He is a patient of Dr. Burr Medico.  PMH includes thyroid disease, hypercholesterolemia,  HTN.  Medications include Synthroid.  Labs were reviewed.  Height: 6 feet. Weight: 186.3 pounds. UBW: 174 pounds February 2016. BMI: 25.26.  Patient lives at [facility] in [place]. No nutrition issues at this time. Patient reports he would like to lose weight since he is "at least"  20 pounds over his Usual weight. Is familiar with RD who works at [employer].  Nutrition Diagnosis:  Food and Nutrition Related Knowledge Deficit related to Rectal cancer as evidenced by no prior need for nutrition related information.  Intervention:  Educated patient on healthy diet with adequate protein to promote maintenance of lean body mass. Provided fact sheet on increasing calories and protein. Educated patient on strategies for eating with diarrhea and provided a fact sheet. Questions answered. Teach back method used. Contact information provided.  Monitoring, Evaluation, Goals:  Patient will tolerate healthy diet to maintain lean body mass.  Next Visit to be scheduled as needed.

## 2015-07-29 ENCOUNTER — Encounter: Payer: Self-pay | Admitting: Radiation Oncology

## 2015-07-29 ENCOUNTER — Encounter: Payer: Self-pay | Admitting: *Deleted

## 2015-07-29 ENCOUNTER — Telehealth: Payer: Self-pay | Admitting: *Deleted

## 2015-07-29 ENCOUNTER — Telehealth: Payer: Self-pay | Admitting: Pharmacist

## 2015-07-29 ENCOUNTER — Other Ambulatory Visit: Payer: Self-pay | Admitting: Hematology

## 2015-07-29 DIAGNOSIS — Z5111 Encounter for antineoplastic chemotherapy: Secondary | ICD-10-CM | POA: Insufficient documentation

## 2015-07-29 DIAGNOSIS — C20 Malignant neoplasm of rectum: Secondary | ICD-10-CM

## 2015-07-29 MED ORDER — CAPECITABINE 500 MG PO TABS: 825.0000 mg/m2 | ORAL_TABLET | Freq: Two times a day (BID) | ORAL | Status: DC

## 2015-07-29 NOTE — Progress Notes (Signed)
GI Location of Tumor / Histology: Rectal Cancer  Nathaniel [name] presented  months ago with symptoms of: abnormal bowels 2-3 months, recent blood    Biopsies of  (if applicable) revealed: Diagnosis 07/16/2015: Rectum, biopsy, mass - INVASIVE ADENOCARCINOMA WITH SIGNET RING CELL FEATURES. - LYMPHOVASCULAR INVASION IS IDENTIFIED  Past/Anticipated interventions by surgeon, if any: Dr.  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 Tonia Brooms  Past/Anticipated interventions by medical oncology, if any:  Dr. Burr Medico 07/23/15 note: Chemo education 07/30/15, Pet scan 08/06/15; sees Dr. Burr Medico 08/07/15   Weight changes, if any:   Bowel/Bladder complaints, if any: frequent small loose stools, rectal bleeding   Nausea / Vomiting, if any:  Pain issues, if any:    Any blood per rectum:yes    SAFETY ISSUES: YES,weakness,uses walker ,limps due to bad knee,   Prior radiation?  NO  Pacemaker/ICD? NO  Is the patient on methotrexate?  NO  Current Details/C/O: Widowed,  CT Sim 07/30/15 , at 1000 am Lives in an independent   Skilled Living facilty`HX A-FIB  With rapid ventricular response  And controlled with medications,  Pericarditis and pericardial effusion, Hx TIA ,sleep apnea, Father deceased heart disease,Mother deceased cancer involving spine, died of Parkinson's , 1 Brother deceased MI,   Allergies: Cinnamon,Ibuprofen,percocet,protonix,& Xarelto Atorvastatin calcium

## 2015-07-29 NOTE — Telephone Encounter (Signed)
Oncology Nurse Navigator Documentation  Oncology Nurse Navigator Flowsheets 07/29/2015  Referral date to RadOnc/MedOnc -  Navigator Encounter Type Telephone  Patient Visit Type -  Treatment Phase -  Barriers/Navigation Needs -  Education -  Interventions Coordination of Care--Faxed office notes and called [facility] to request EUS(rectal) and colonoscopy prior to RT start on 08/12/15 at request of Dr. Burr Medico.  Coordination of Care EUS-rectal/colonoscopy  Education Method -  Support Groups/Services -  Time Spent with Patient -  Confirmed with office that referral was received.

## 2015-07-29 NOTE — Progress Notes (Signed)
GI Location of Tumor / Histology: Rectal Cancer  Nathaniel [name] presented  months ago with symptoms of: abnormal bowels 2-3 months, recent blood    Biopsies of  (if applicable) revealed: Diagnosis 07/16/2015: Rectum, biopsy, mass - INVASIVE ADENOCARCINOMA WITH SIGNET RING CELL FEATURES. - LYMPHOVASCULAR INVASION IS IDENTIFIED  Past/Anticipated interventions by surgeon, if any: Dr. Cristina Gong , flexible sigmoidoscopy  With biopsy on 07/16/15  Past/Anticipated interventions by medical oncology, if any:  Dr. Burr Medico 07/23/15 note: Chemo education 07/30/15, Pet scan 08/06/15; sees Dr. Burr Medico 08/07/15  Weight changes, if any:   Bowel/Bladder complaints, if any: frequent small loose stools,   Nausea / Vomiting, if any:  Pain issues, if any:    Any blood per rectum:    SAFETY ISSUES:  Prior radiation?  NO  Pacemaker/ICD? NO  Is the patient on methotrexate?  NO  Current Details/C/O: Widowed,  CT Sim 07/30/15 Lives in an independent  Living facilty`

## 2015-07-29 NOTE — Telephone Encounter (Signed)
11/21 - Rx for Xeloda is able to be filled at [facility]. Copay is $0 with patient's supplemental insurance. Will call patient for overview and discuss that patient can pick up medication prior to chemoradiation which appears to be scheduled for 12/5.   Thank you,  Montel Clock, PharmD, BCOP

## 2015-07-29 NOTE — Telephone Encounter (Signed)
11/21: Rx for Xeloda sent to [facility]

## 2015-07-30 ENCOUNTER — Encounter: Payer: Self-pay | Admitting: *Deleted

## 2015-07-30 ENCOUNTER — Encounter: Payer: Self-pay | Admitting: Pharmacist

## 2015-07-30 ENCOUNTER — Ambulatory Visit
Admission: RE | Admit: 2015-07-30 | Discharge: 2015-07-30 | Disposition: A | Discharge status: Home / Self Care | Payer: Medicare Other | Source: Ambulatory Visit | Attending: Radiation Oncology | Admitting: Radiation Oncology

## 2015-07-30 ENCOUNTER — Ambulatory Visit: Payer: Medicare Other

## 2015-07-30 VITALS — BP 176/69 | HR 55 | Temp 98.0°F | Resp 18 | Wt 185.3 lb

## 2015-07-30 DIAGNOSIS — I10 Essential (primary) hypertension: Secondary | ICD-10-CM | POA: Diagnosis not present

## 2015-07-30 DIAGNOSIS — E039 Hypothyroidism, unspecified: Secondary | ICD-10-CM | POA: Diagnosis not present

## 2015-07-30 DIAGNOSIS — E7800 Pure hypercholesterolemia, unspecified: Secondary | ICD-10-CM | POA: Diagnosis not present

## 2015-07-30 DIAGNOSIS — C20 Malignant neoplasm of rectum: Secondary | ICD-10-CM

## 2015-07-30 DIAGNOSIS — Z7982 Long term (current) use of aspirin: Secondary | ICD-10-CM | POA: Diagnosis not present

## 2015-07-30 DIAGNOSIS — E079 Disorder of thyroid, unspecified: Secondary | ICD-10-CM | POA: Diagnosis not present

## 2015-07-30 DIAGNOSIS — Z510 Encounter for antineoplastic radiation therapy: Secondary | ICD-10-CM | POA: Diagnosis not present

## 2015-07-30 DIAGNOSIS — F1010 Alcohol abuse, uncomplicated: Secondary | ICD-10-CM | POA: Diagnosis not present

## 2015-07-30 NOTE — Progress Notes (Signed)
Consent for xeloda signed.

## 2015-07-30 NOTE — Progress Notes (Signed)
Please see the Nurse Progress Note in the MD Initial Consult Encounter for this patient. 

## 2015-07-30 NOTE — Progress Notes (Signed)
Oral Chemotherapy Pharmacist Encounter   I spoke with patient for overview of new oral chemotherapy medication: Xeloda. Pt is doing well. The prescriptions have been sent to the [facility] for benefit analysis and approval. Copay is $0. Pt will pick up after 11/30 PET scan. Plan start date of Xeloda and radiation is Monday 12/5  Counseled patient on administration, dosing, side effects, safe handling, and monitoring. Side effects include but not limited to: Diarrhea, fatigue, mouth sores, and hand-foot syndrome.  Nathaniel [name] voiced understanding and appreciation.   All questions answered.  Will follow up for adherence and toxicity management.   Thank you,  Montel Clock, PharmD, [facility]

## 2015-08-05 ENCOUNTER — Telehealth: Payer: Self-pay | Admitting: Cardiology

## 2015-08-05 ENCOUNTER — Telehealth: Payer: Self-pay | Admitting: *Deleted

## 2015-08-05 NOTE — Telephone Encounter (Signed)
Pt states he was  recently diagnosed with rectal cancer. Pt states he is to start radiation daily with Xeloda oral daily. Pt asking if this is [place] with Dr Aundra Dubin.  Pt advised I will forward to Dr Aundra Dubin for review.

## 2015-08-05 NOTE — Telephone Encounter (Signed)
Called [facility] to follow up on referral for colonoscopy/lower EUS sent on 07/29/15. Their office called to to arrange office visit first, per Dr. Paulita Fujita and patient declined appointment schedule until he speaks with the physician.

## 2015-08-05 NOTE — Telephone Encounter (Signed)
Pt advised.

## 2015-08-05 NOTE — Telephone Encounter (Signed)
OK, if develops exertional dyspnea let us know.

## 2015-08-05 NOTE — Telephone Encounter (Signed)
NEw Message  Pt calling to speak w/ rN- pt having cancer treatment and wants to discuss possible conflict w/ his heart care. Please call back and discuss.

## 2015-08-06 ENCOUNTER — Telehealth: Payer: Self-pay | Admitting: *Deleted

## 2015-08-06 ENCOUNTER — Encounter (HOSPITAL_COMMUNITY)
Admission: RE | Admit: 2015-08-06 | Discharge: 2015-08-06 | Disposition: A | Discharge status: Home / Self Care | Payer: Medicare Other | Source: Ambulatory Visit | Attending: Hematology | Admitting: Hematology

## 2015-08-06 DIAGNOSIS — C20 Malignant neoplasm of rectum: Secondary | ICD-10-CM | POA: Diagnosis not present

## 2015-08-06 LAB — GLUCOSE, CAPILLARY: GLUCOSE-CAPILLARY: 92 mg/dL (ref 65–99)

## 2015-08-06 MED ORDER — FLUDEOXYGLUCOSE F - 18 (FDG) INJECTION: 11.2000 | Freq: Once | INTRAVENOUS | Status: DC | PRN

## 2015-08-06 NOTE — Telephone Encounter (Signed)
Oncology Nurse Navigator Documentation  Oncology Nurse Navigator Flowsheets 08/06/2015  Referral date to RadOnc/MedOnc -  Navigator Encounter Type Telephone  Patient Visit Type -  Treatment Phase Treatment planning  Barriers/Navigation Needs Family concerns  Education Preparing for Upcoming  Treatment  Interventions Coordination of Care  Coordination of Care Other--message to CSW to assist with his Healthcare POA tomorrow  Education Method Verbal  Support Groups/Services -  Time Spent with Patient 15  Confirmed with [place] he will see Dr. Paulita Fujita tomorrow after his PET scan. He understands and agrees with why the lower EUS/colonoscopy is beneficial to his treatment plan. Confirmed his appointment with Dr. Burr Medico tomorrow.

## 2015-08-07 ENCOUNTER — Other Ambulatory Visit (HOSPITAL_BASED_OUTPATIENT_CLINIC_OR_DEPARTMENT_OTHER): Payer: Medicare Other

## 2015-08-07 ENCOUNTER — Encounter (HOSPITAL_COMMUNITY): Payer: Self-pay | Admitting: *Deleted

## 2015-08-07 ENCOUNTER — Encounter: Payer: Self-pay | Admitting: Hematology

## 2015-08-07 ENCOUNTER — Ambulatory Visit (HOSPITAL_BASED_OUTPATIENT_CLINIC_OR_DEPARTMENT_OTHER): Payer: Medicare Other | Admitting: Hematology

## 2015-08-07 ENCOUNTER — Encounter: Payer: Self-pay | Admitting: *Deleted

## 2015-08-07 ENCOUNTER — Telehealth: Payer: Self-pay | Admitting: Hematology

## 2015-08-07 ENCOUNTER — Other Ambulatory Visit: Payer: Self-pay | Admitting: Gastroenterology

## 2015-08-07 VITALS — BP 182/81 | HR 77 | Temp 98.2°F | Resp 18 | Ht 72.0 in | Wt 185.2 lb

## 2015-08-07 DIAGNOSIS — M545 Low back pain: Secondary | ICD-10-CM

## 2015-08-07 DIAGNOSIS — C20 Malignant neoplasm of rectum: Secondary | ICD-10-CM

## 2015-08-07 LAB — CBC WITH DIFFERENTIAL/PLATELET
BASO%: 0.5 % (ref 0.0–2.0)
BASOS ABS: 0 10*3/uL (ref 0.0–0.1)
EOS ABS: 0.1 10*3/uL (ref 0.0–0.5)
EOS%: 1 % (ref 0.0–7.0)
HCT: 42.9 % (ref 38.4–49.9)
HGB: 14.2 g/dL (ref 13.0–17.1)
LYMPH%: 24.3 % (ref 14.0–49.0)
MCH: 31.3 pg (ref 27.2–33.4)
MCHC: 33.1 g/dL (ref 32.0–36.0)
MCV: 94.7 fL (ref 79.3–98.0)
MONO#: 0.7 10*3/uL (ref 0.1–0.9)
MONO%: 12.1 % (ref 0.0–14.0)
NEUT%: 62.1 % (ref 39.0–75.0)
NEUTROS ABS: 3.8 10*3/uL (ref 1.5–6.5)
PLATELETS: 207 10*3/uL (ref 140–400)
RBC: 4.53 10*6/uL (ref 4.20–5.82)
RDW: 14.2 % (ref 11.0–14.6)
WBC: 6.1 10*3/uL (ref 4.0–10.3)
lymph#: 1.5 10*3/uL (ref 0.9–3.3)

## 2015-08-07 LAB — COMPREHENSIVE METABOLIC PANEL (CC13)
ALBUMIN: 3.6 g/dL (ref 3.5–5.0)
ALK PHOS: 93 U/L (ref 40–150)
ALT: 17 U/L (ref 0–55)
ANION GAP: 9 meq/L (ref 3–11)
AST: 24 U/L (ref 5–34)
BILIRUBIN TOTAL: 0.85 mg/dL (ref 0.20–1.20)
BUN: 14.3 mg/dL (ref 7.0–26.0)
CO2: 25 meq/L (ref 22–29)
Calcium: 9.2 mg/dL (ref 8.4–10.4)
Chloride: 104 mEq/L (ref 98–109)
Creatinine: 1 mg/dL (ref 0.7–1.3)
EGFR: 69 mL/min/{1.73_m2} — AB (ref >90)
Glucose: 129 mg/dl (ref 70–140)
POTASSIUM: 4.3 meq/L (ref 3.5–5.1)
SODIUM: 139 meq/L (ref 136–145)
TOTAL PROTEIN: 6.8 g/dL (ref 6.4–8.3)

## 2015-08-07 MED ORDER — TRAMADOL HCL 50 MG PO TABS: 50.0000 mg | ORAL_TABLET | Freq: Four times a day (QID) | ORAL | Status: DC | PRN

## 2015-08-07 NOTE — Progress Notes (Signed)
Pt has hx of atrial fib due to acute pericarditis 2 years ago. States he's not had any problems since. His cardiologist is Dr. Aundra Dubin and he was last seen in September, 2016 and told to return in a year.   EKG - 05/24/15 in [facility]  Echo - 06/06/13 in [facility]

## 2015-08-07 NOTE — Progress Notes (Signed)
Oncology Nurse Navigator Documentation  Oncology Nurse Navigator Flowsheets 08/07/2015  Referral date to RadOnc/MedOnc -  Navigator Encounter Type Routine f/u  Patient Visit Type Medonc  Treatment Phase Treatment planning  Barriers/Navigation Needs Education;Family concerns  Education Pain/ Symptom Management;Preparing for Upcoming Surgery/ Treatment  Interventions Education Method  Coordination of Care -  Education Method Verbal;Teach-back;Written  Support Groups/Services GI  Time Spent with Patient 30  Reviewed his prep for colonoscopy tomorrow and need for driver. Reviewed MD directions for taking Tramadol for pain and to purchase Imodium today when he picks up his chemo pills at pharmacy. Inquired if he can have a spinal cortisone injection per Dr. Sherwood Gambler during treatment for his back pain. Dr. Burr Medico said OK to do so. He has [facility] transportation when needed. Otherwise he can drive himself and use valet. Left his Healthcare POA forms in car. Will bring these on Monday. CSW notified.

## 2015-08-07 NOTE — Progress Notes (Signed)
[place]  [phone] [phone]  Clinic follow up Note   Patient Care Team: Nathaniel Fess, MD as PCP - General (Family Medicine) Nathaniel Lobo, MD as Consulting Physician (Gastroenterology) Nathaniel Ade, RN as Registered Nurse 08/07/2015   CHIEF COMPLAINTS:  Follow up rectal cancer  HISTORY OF PRESENTING ILLNESS:  Nathaniel [name] 81 y.o. male is here because of recently diagnosed rectal cancer. He presents to the clinic by himself.  He has had abnormal bowel movement for the past 2-3 months. It's usually very small amount, loose, every time he urinates, he has a such a small bowel movement. He does not really have a good normal bowel movement. He also reports frequent stool stains on his underwear. He noticed small amount blood in his stool recently, which prompt his seeing GI Dr. Cristina [name] on. He underwent a flexible sigmoidoscopy in the office, which showed a rectal mass, biopsy reviewed adenocarcinoma. He denies significant rectal pain, abdominal discomfort, or other new complaints.  He lives in a independent living facility, he is able to take care of his all ADLs, and does some light housework, shopping, by himself. He uses a walker, still drives. He does get fatigued after activity, his energy level has seemed to be decreasing in over the past few years. No significant change daily. He has decent appetite and eats well.  He had remote history of ulcerative colitis, which has been in remission. 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  He is widowed, has 5 children, who all live in out states. He does have several stepchildren from his secondary to, and some of them live in [place].  CURRENT TREATMENT: He will start concurrent chemotherapy and radiation with Capecitabine 1500 mg twice daily, on 08/12/2015  INTERIM HISTORY [name] returns for follow-up. He was seen by a  radiation oncologist Dr. Lisbeth [name] and rectal surgeon Dr. Marcello [name] last week. He also saw a gastroenterologist Dr. Bonnita [name] yesterday and is scheduled to have colonoscopy and EUS tomorrow. He developed severe back pain when he had radiation simulation and PET scan, improved with positioning. He still has "mush" bowel movement a few times a day, no abdominal pain or nausea. His appetite is good.   MEDICAL HISTORY:  Past Medical History  Diagnosis Date  . Thyroid disease   . Hypercholesteremia   . HTN (hypertension)   . Hypothyroidism   . Cancer ([place]) 07/16/15    Rectal Cancer=adenocarcinoma  . Allergy   . Atrial fibrillation ([place])   . Pericarditis   . 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 Kidney stone   . Arthritis     SURGICAL HISTORY: Past Surgical History  Procedure Laterality Date  . Shoulder surgery Right 2013    rotator cuff   . Knee surgery Left 75 years ago  . Ankle fracture surgery Left 81 years ago  . Hernia repair  28 years ago  . Mohs surgery    . Eye surgery      SOCIAL HISTORY: Social History   Social History  . Marital Status: Widowed     Spouse Name: N/A  . Number of Children: 4 daughters and one son, and 5 stepchildren   . Years of Education: N/A   Occupational History  . 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 Smoking status: Never Smoker   .  Smokeless tobacco: Not on file  . Alcohol Use: 0.6 oz/week    1 Glasses of wine per week     Comment: 1 glass of wine  . Drug Use: No  . Sexual Activity: Not on file   Other Topics Concern  . Not on file   Social History Narrative    FAMILY HISTORY: Family History  Problem Relation Age of Onset  . Stroke Father   . Cancer Mother     spine ,died of parkinson's    ALLERGIES:  is allergic to cinnamon; ibuprofen; percocet; protonix; and xarelto.  MEDICATIONS:  Current Outpatient Prescriptions  Medication Sig Dispense Refill  . aspirin 325 MG  tablet Take 325 mg by mouth daily.     . finasteride (PROSCAR) 5 MG tablet Take 5 mg by mouth daily. Dose is taken at bedtime    . levothyroxine (SYNTHROID, LEVOTHROID) 88 MCG tablet Take 88 mcg by mouth daily before breakfast.     . metoprolol succinate (TOPROL-XL) 25 MG 24 hr tablet TAKE 1/2 TABLET EVERY DAY 45 tablet 0  . traMADol (ULTRAM) 50 MG tablet Take 1 tablet (50 mg total) by mouth every 6 (six) hours as needed. 30 tablet 1   No current facility-administered medications for this visit.   Facility-Administered Medications Ordered in Other Visits  Medication Dose Route Frequency Provider Last Rate Last Dose  . fludeoxyglucose F - 18 (FDG) injection 11.2 milli Curie  11.2 milli Curie Intravenous Once PRN Nathaniel Merle, MD        REVIEW OF SYSTEMS:   Constitutional: Denies fevers, chills or abnormal night sweats Eyes: Denies blurriness of vision, double vision or watery eyes Ears, nose, mouth, throat, and face: Denies mucositis or sore throat Respiratory: Denies cough, dyspnea or wheezes Cardiovascular: Denies palpitation, chest discomfort or lower extremity swelling Gastrointestinal:  Denies nausea, heartburn or change in bowel habits Skin: Denies abnormal skin rashes Lymphatics: Denies new lymphadenopathy or easy bruising Neurological:Denies numbness, tingling or new weaknesses Behavioral/Psych: Mood is stable, no new changes  All other systems were reviewed with the patient and are negative.  PHYSICAL EXAMINATION: ECOG PERFORMANCE STATUS: 1 - Symptomatic but completely ambulatory  Filed Vitals:   08/07/15 0949  BP: 182/81  Pulse: 77  Temp: 98.2 F (36.8 C)  Resp: 18   Filed Weights   08/07/15 0949  Weight: 185 lb 3.2 oz (84.006 kg)    GENERAL:alert, no distress and comfortable SKIN: skin color, texture, turgor are normal, no rashes or significant lesions EYES: normal, conjunctiva are pink and non-injected, sclera clear OROPHARYNX:no exudate, no erythema and lips,  buccal mucosa, and tongue normal  NECK: supple, thyroid normal size, non-tender, without nodularity LYMPH:  no palpable lymphadenopathy in the cervical, axillary or inguinal LUNGS: clear to auscultation and percussion with normal breathing effort HEART: regular rate & rhythm and no murmurs and no lower extremity edema ABDOMEN:abdomen soft, non-tender and normal bowel sounds. Rectal exam revealed an anterior low rectal mass, non-tender, no blood on glove.  Musculoskeletal:no cyanosis of digits and no clubbing  PSYCH: alert & oriented x 3 with fluent speech NEURO: no focal motor/sensory deficits  LABORATORY DATA:  I have reviewed the data as listed Lab Results  Component Value Date   WBC 6.1 08/07/2015   HGB 14.2 08/07/2015   HCT 42.9 08/07/2015   MCV 94.7 08/07/2015   PLT 207 08/07/2015    Recent Labs  07/23/15 1334 08/07/15 0933  NA 141 139  K 4.4 4.3  CO2 27 25  GLUCOSE 97 129  BUN 12.1 14.3  CREATININE 0.8 1.0  CALCIUM 9.5 9.2  PROT 6.4 6.8  ALBUMIN 3.5 3.6  AST 22 24  ALT 18 17  ALKPHOS 90 93  BILITOT 0.63 0.85    PATHOLOGY REPORT  Diagnosis 07/16/2015  Rectum, biopsy, mass - INVASIVE ADENOCARCINOMA WITH SIGNET RING CELL FEATURES. - LYMPHOVASCULAR INVASION IS IDENTIFIED. - SEE COMMENT. Microscopic Comment To help evaluate the case, immunohistochemistry stains were performed. The malignant cells are positive for CDX2 and cytokeratin AE1/AE3. There is focal staining for CD56, a neuroendocrine marker. Prostein and S-100 stains are negative. Overall, the features are consistent with invasive adenocarcinoma of colonic origin, with signet ring cell features. The case was discussed with Dr. Cristina [name] on 07/18/15. (JBK:ds 07/18/15)   RADIOGRAPHIC STUDIES: I have personally reviewed the radiological images as listed and agreed with the findings in the report. Nm Pet Image Initial (pi) Skull Base To Thigh  08/06/2015  CLINICAL DATA:  Initial treatment strategy for new  diagnosis of rectal cancer. Staging. History of melanoma and remote history of ulcerative colitis. EXAM: NUCLEAR MEDICINE PET SKULL BASE TO THIGH TECHNIQUE: 11 point to mCi F-18 FDG was injected intravenously. Full-ring PET imaging was performed from the skull base to thigh after the radiotracer. CT data was obtained and used for attenuation correction and anatomic localization. FASTING BLOOD GLUCOSE:  Value: 92 mg/dl COMPARISON:  Clinic note of 07/23/2015. FINDINGS: NECK No areas of abnormal hypermetabolism. CHEST Focal hypermetabolism at the distal esophagus is without CT correlate and favored to be physiologic. This measures a S.U.V. max of 5.8, including on image 95/series 4. ABDOMEN/PELVIS Multifocal colonic hypermetabolism is primarily felt to be physiologic. Anal rectal wall thickening and hypermetabolism, consistent with the clinical history of primary malignancy. This measures a S.U.V. max of 10.9, including on image 183/series 4. No hypermetabolic pelvic sidewall nodes are seen. There is transmural extension and/or a a perirectal node at the 11 o'clock position on image 183/ series 4. This measures 8 mm. Although no high-grade obstruction is seen, there is a large volume of proximal stool. A separate area of more cephalad rectosigmoid junction underdistention and hypermetabolism is identified. This measures a S.U.V. max of 12.7, including on image 166/series 4. SKELETON Osteoarthritis of the left hip is advanced with concurrent hypermetabolism. CT IMAGES PERFORMED FOR ATTENUATION CORRECTION No cervical adenopathy. Moderate cardiomegaly with multivessel coronary artery atherosclerosis. Pulmonary artery enlargement, outflow tract 3.7 cm. No small bowel obstruction. Mild ectasia of the right common iliac artery. Fat containing right inguinal hernia. Moderate prostatomegaly. Multiple (greater than 10) bladder stones on the order of 8 mm. IMPRESSION: 1. Anorectal primary. Transmural spread and/or perirectal  nodal disease, without distant hypermetabolic metastasis. 2. Large colonic stool burden more proximally may represent a component of partial obstruction. No small bowel distention to suggest proximal propagation. 3. Separate area of rectosigmoid junction underdistention and hypermetabolism. Correlate with results of prior colonoscopy. If the prior exam was not able to traverse the primary anal rectal lesion, a synchronous carcinoma cannot be entirely excluded. 4. Prostatomegaly with multiple bladder stones, suggesting a component of bladder outlet obstruction. 5. Cardiomegaly with coronary artery atherosclerosis and pulmonary artery enlargement. The latter finding suggests pulmonary arterial hypertension. Electronically Signed   By: Abigail Miyamoto M.D.   On: 08/06/2015 16:50     Flexible  Sigmoidoscopy 07/16/2015 by Dr. Cristina [name]  impression , an infiltrative no obstructing large mass was found in the distal rectum. The mass was partially circumferential (involving one third of  the lumen circumference ). The mass measured 5 cm in length.  Biopsy was obtained.       ASSESSMENT & PLAN:  81 year old Caucasian male, with past medical history of ulcerative colitis, hypertension and hypothyroidism, who lives in a independent living, presented with bowel habits change in the mild rectal bleeding.  1. Low rectal adenocarcinoma, TxN1M0 -I previously reviewed his sigmoidoscopy findings and the biopsy results in great detail with him. -I reviewed his PET CT scan result with him in details. He appears to have regional lymphadenopathy, but no evidence of distant metastasis. -He is scheduled to have a full colonoscopy and [place] for tumor and node staging tomorrow by Dr. Paulita Fujita -He was seen by colorectal surgeon Dr. Marcello [name], surgical resection would be considered if he tolerates neoadjuvant chemotherapy and radiation. -Due to his advanced age and some comorbidities, I'll consider low-dose chemotherapy with concurrent  radiation. --Chemotherapy consent: Side effects including but does not not limited to, fatigue, nausea, vomiting, diarrhea, hair loss, neuropathy, fluid retention, renal and kidney dysfunction, neutropenic fever, needed for blood transfusion, bleeding, coronary artery spasm and heart attack, were discussed with patient in great detail. he agrees to proceed. -I have sent a prescription of capecitabine 1500mg  (725mg /m2) q12hr to [facility] pharmacy. -He is tentatively scheduled to start chemotherapy and radiation on Monday, 08/08/2015.  2. Low back pain  -Not well controlled, I given her progress prescription of tramadol today  3. HTN, AF, arthritis -He will continue follow-up with his primary care physician  Plan -Colonoscopy and EUS tomorrow -start concurrent chemoradiation with capecitabine, Monday, 08/12/2015 -I'll see him back in 2 weeks, and we'll follow him weekly afterwards with lab   All questions were answered. The patient knows to call the clinic with any problems, questions or concerns. I spent 25 minutes counseling the patient face to face. The total time spent in the appointment was 30 minutes and more than 50% was on counseling.     Nathaniel Merle, MD 08/07/2015 10:24 PM

## 2015-08-07 NOTE — Telephone Encounter (Signed)
per pof to sch pt appt-gave pt copy of avs °

## 2015-08-07 NOTE — Progress Notes (Signed)
  Radiation Oncology         [phone] ________________________________  Name: Nathaniel [name] MRN: [id number]  Date: 07/30/2015  DOB: 1919/09/26  Diagnosis:     ICD-9-CM ICD-10-CM   1. Rectal cancer ([place]) 154.1 C20      SIMULATION AND TREATMENT PLANNING NOTE  The patient presented for simulation for the patient's upcoming course of radiation for the diagnosis of rectal cancer. The patient was placed in a supine position. A customized vac-lock bag was constructed to aid in patient immobilization on. This complex treatment device will be used on a daily basis during the treatment. In this fashion a CT scan was obtained through the pelvic region and the isocenter was placed near midline within the pelvis. Surface markings were placed.  The patient's imaging was loaded into the radiation treatment planning system. The patient will initially be planned to receive a course of radiation to a dose of 45 Gy. This will be accomplished in 25 fractions at 1.8 gray per fraction. This initial treatment will correspond to a 3-D conformal technique. The target has been contoured in addition to the rectum, bladder and femoral heads. Dose volume histograms of each of these structures have been requested and these will be carefully reviewed as part of the 3-D conformal treatment planning process. To accomplish this initial treatment, 4 customized blocks have been designed for this purpose. Each of these 4 complex treatment devices will be used on a daily basis during the initial course of the treatment. It is anticipated that the patient will then receive a boost for an additional 5.4 Gy. The anticipated total dose therefore will be 50.4 Gy.    Special treatment procedure The patient will receive chemotherapy during the course of radiation treatment. The patient may experience increased or overlapping toxicity due to this combined-modality approach and the patient will be monitored for such problems. This may  include extra lab work as necessary. This therefore constitutes a special treatment procedure.    ________________________________  Jodelle Gross, MD, PhD

## 2015-08-08 ENCOUNTER — Telehealth: Payer: Self-pay | Admitting: *Deleted

## 2015-08-08 ENCOUNTER — Encounter (HOSPITAL_COMMUNITY): Payer: Self-pay | Admitting: Anesthesiology

## 2015-08-08 ENCOUNTER — Ambulatory Visit (HOSPITAL_COMMUNITY): Payer: Medicare Other | Admitting: Anesthesiology

## 2015-08-08 ENCOUNTER — Encounter (HOSPITAL_COMMUNITY)
Admission: RE | Disposition: A | Discharge status: Home / Self Care | Payer: Self-pay | Source: Ambulatory Visit | Attending: Gastroenterology

## 2015-08-08 ENCOUNTER — Ambulatory Visit (HOSPITAL_COMMUNITY)
Admission: RE | Admit: 2015-08-08 | Discharge: 2015-08-08 | Disposition: A | Discharge status: Home / Self Care | Payer: Medicare Other | Source: Ambulatory Visit | Attending: Gastroenterology | Admitting: Gastroenterology

## 2015-08-08 DIAGNOSIS — Z7982 Long term (current) use of aspirin: Secondary | ICD-10-CM | POA: Diagnosis not present

## 2015-08-08 DIAGNOSIS — E785 Hyperlipidemia, unspecified: Secondary | ICD-10-CM | POA: Diagnosis not present

## 2015-08-08 DIAGNOSIS — G4730 Sleep apnea, unspecified: Secondary | ICD-10-CM | POA: Diagnosis not present

## 2015-08-08 DIAGNOSIS — Z79899 Other long term (current) drug therapy: Secondary | ICD-10-CM | POA: Diagnosis not present

## 2015-08-08 DIAGNOSIS — R197 Diarrhea, unspecified: Secondary | ICD-10-CM | POA: Diagnosis not present

## 2015-08-08 DIAGNOSIS — E039 Hypothyroidism, unspecified: Secondary | ICD-10-CM | POA: Diagnosis not present

## 2015-08-08 DIAGNOSIS — Z87442 Personal history of urinary calculi: Secondary | ICD-10-CM | POA: Insufficient documentation

## 2015-08-08 DIAGNOSIS — Z86010 Personal history of colonic polyps: Secondary | ICD-10-CM | POA: Diagnosis not present

## 2015-08-08 DIAGNOSIS — N400 Enlarged prostate without lower urinary tract symptoms: Secondary | ICD-10-CM | POA: Insufficient documentation

## 2015-08-08 DIAGNOSIS — I4891 Unspecified atrial fibrillation: Secondary | ICD-10-CM | POA: Diagnosis not present

## 2015-08-08 DIAGNOSIS — C20 Malignant neoplasm of rectum: Secondary | ICD-10-CM | POA: Diagnosis not present

## 2015-08-08 DIAGNOSIS — I10 Essential (primary) hypertension: Secondary | ICD-10-CM | POA: Diagnosis not present

## 2015-08-08 DIAGNOSIS — Z8673 Personal history of transient ischemic attack (TIA), and cerebral infarction without residual deficits: Secondary | ICD-10-CM | POA: Diagnosis not present

## 2015-08-08 DIAGNOSIS — Z85820 Personal history of malignant melanoma of skin: Secondary | ICD-10-CM | POA: Insufficient documentation

## 2015-08-08 DIAGNOSIS — K921 Melena: Secondary | ICD-10-CM | POA: Diagnosis not present

## 2015-08-08 DIAGNOSIS — Z510 Encounter for antineoplastic radiation therapy: Secondary | ICD-10-CM | POA: Diagnosis not present

## 2015-08-08 HISTORY — PX: FLEXIBLE SIGMOIDOSCOPY: SHX5431

## 2015-08-08 HISTORY — PX: EUS: SHX5427

## 2015-08-08 SURGERY — ULTRASOUND, LOWER GI TRACT, ENDOSCOPIC
Anesthesia: Monitor Anesthesia Care

## 2015-08-08 MED ORDER — LACTATED RINGERS IV SOLN
INTRAVENOUS | Status: DC
Start: 2015-08-08 — End: 2015-08-08
  Administered 2015-08-08: 1000 mL via INTRAVENOUS

## 2015-08-08 MED ORDER — PROPOFOL 10 MG/ML IV BOLUS
INTRAVENOUS | Status: DC | PRN
Start: 2015-08-08 — End: 2015-08-08
  Administered 2015-08-08 (×4): 20 mg via INTRAVENOUS

## 2015-08-08 MED ORDER — SODIUM CHLORIDE 0.9 % IV SOLN: INTRAVENOUS | Status: DC

## 2015-08-08 MED ORDER — ONDANSETRON HCL 4 MG/2ML IJ SOLN
INTRAMUSCULAR | Status: DC | PRN
  Administered 2015-08-08: 4 mg via INTRAVENOUS

## 2015-08-08 MED ORDER — LACTATED RINGERS IV SOLN
INTRAVENOUS | Status: DC | PRN
  Administered 2015-08-08: 08:00:00 via INTRAVENOUS

## 2015-08-08 MED ORDER — LIDOCAINE HCL (CARDIAC) 20 MG/ML IV SOLN
INTRAVENOUS | Status: DC | PRN
  Administered 2015-08-08: 30 mg via INTRAVENOUS

## 2015-08-08 NOTE — Addendum Note (Signed)
Addended by: Arta Silence on: 08/08/2015 07:58 AM   Modules accepted: Orders

## 2015-08-08 NOTE — Transfer of Care (Signed)
Immediate Anesthesia Transfer of Care Note  Patient: Nathaniel [name]  Procedure(s) Performed: Procedure(s) with comments: LOWER ENDOSCOPIC ULTRASOUND (EUS) (N/A) FLEXIBLE SIGMOIDOSCOPY (N/A) - poor prep  Patient Location: PACU  Anesthesia Type:MAC  Level of Consciousness: awake, alert  and oriented  Airway & Oxygen Therapy: Patient Spontanous Breathing and Patient connected to nasal cannula oxygen  Post-op Assessment: Report given to RN, Post -op Vital signs reviewed and stable and Patient moving all extremities X 4  Post vital signs: Reviewed and stable  Last Vitals:  Filed Vitals:   08/08/15 0719 08/08/15 0834  BP: 154/80 112/47  Pulse: 80 65  Temp: 36.7 C   Resp: 18 11    Complications: No apparent anesthesia complications

## 2015-08-08 NOTE — Anesthesia Postprocedure Evaluation (Signed)
Anesthesia Post Note  Patient: Nathaniel [name]  Procedure(s) Performed: Procedure(s) (LRB): LOWER ENDOSCOPIC ULTRASOUND (EUS) (N/A) FLEXIBLE SIGMOIDOSCOPY (N/A)  Anesthesia Post Evaluation  Last Vitals:  Filed Vitals:   08/08/15 0719 08/08/15 0834  BP: 154/80 112/47  Pulse: 80 65  Temp: 36.7 C   Resp: 18 11    Last Pain: There were no vitals filed for this visit.               [id number]

## 2015-08-08 NOTE — H&P (Signed)
Patient interval history reviewed.  Patient examined again.  There has been no change from documented H/P dated 08/06/15 (scanned into chart from our office) except as documented above.  Assessment:  1.  Rectal Cancer.  Plan:  1.  Colonoscopy and endorectal ultrasound. 2.  Risks (bleeding, infection, bowel perforation that could require surgery, sedation-related changes in cardiopulmonary systems), benefits (identification and possible treatment of source of symptoms, exclusion of certain causes of symptoms), and alternatives (watchful waiting, radiographic imaging studies, empiric medical treatment) of colonoscopy and endorectal ultrasound were explained to patient/family in detail and patient wishes to proceed.

## 2015-08-08 NOTE — Discharge Instructions (Signed)
Sigmoidoscopy + Endorectal ultrasound (RUS).  Post procedure instructions:  Read the instructions outlined below and refer to this sheet in the next few weeks. These discharge instructions provide you with general information on caring for yourself after you leave the hospital. Your doctor may also give you specific instructions. While your treatment has been planned according to the most current medical practices available, unavoidable complications occasionally occur. If you have any problems or questions after discharge, call Dr. Paulita Fujita at [facility] [phone]).  HOME CARE INSTRUCTIONS  ACTIVITY:  You may resume your regular activity, but move at a slower pace for the next 24 hours.   Take frequent rest periods for the next 24 hours.   Walking will help get rid of the air and reduce the bloated feeling in your belly (abdomen).   No driving for 24 hours (because of the medicine (anesthesia) used during the test).   You may shower.   Do not sign any important legal documents or operate any machinery for 24 hours (because of the anesthesia used during the test).  NUTRITION:  Drink plenty of fluids.   You may resume your normal diet as instructed by your doctor.   Begin with a light meal and progress to your normal diet. Heavy or fried foods are harder to digest and may make you feel sick to your stomach (nauseated).   Avoid alcoholic beverages for 24 hours or as instructed.  MEDICATIONS:  You may resume your normal medications unless your doctor tells you otherwise.  WHAT TO EXPECT TODAY:  Some feelings of bloating in the abdomen.   Passage of more gas than usual.   Spotting of blood in your stool or on the toilet paper.  IF YOU HAD POLYPS REMOVED DURING THE COLONOSCOPY:  No aspirin products for 7 days or as instructed.   No alcohol for 7 days or as instructed.   Eat a soft diet for the next 24 hours.   FINDING OUT THE RESULTS OF YOUR TEST  Not all test  results are available during your visit. If your test results are not back during the visit, make an appointment with your caregiver to find out the results. Do not assume everything is normal if you have not heard from your caregiver or the medical facility. It is important for you to follow up on all of your test results.     SEEK IMMEDIATE MEDICAL CARE IF:   You have more than a spotting of blood in your stool.   Your belly is swollen (abdominal distention).   You are nauseated or vomiting.   You have a fever.   You have abdominal pain or discomfort that is severe or gets worse throughout the day.    Document Released: 12/24/2003 Document Revised: 05/06/2011 Document Reviewed: 04/05/2008 [facility] Patient Information 2012 [place].

## 2015-08-08 NOTE — Anesthesia Postprocedure Evaluation (Signed)
Anesthesia Post Note  Patient: NAHSIR IULIANO  Procedure(s) Performed: Procedure(s) (LRB): LOWER ENDOSCOPIC ULTRASOUND (EUS) (N/A) FLEXIBLE SIGMOIDOSCOPY (N/A)  Patient location during evaluation: PACU Anesthesia Type: MAC Level of consciousness: awake and alert Pain management: pain level controlled Vital Signs Assessment: post-procedure vital signs reviewed and stable Respiratory status: spontaneous breathing, nonlabored ventilation, respiratory function stable and patient connected to nasal cannula oxygen Cardiovascular status: blood pressure returned to baseline and stable Postop Assessment: no signs of nausea or vomiting Anesthetic complications: no    Last Vitals:  Filed Vitals:   08/08/15 0719 08/08/15 0834  BP: 154/80 112/47  Pulse: 80 65  Temp: 36.7 C 36.5 C  Resp: 18 11    Last Pain: There were no vitals filed for this visit.               Jaquawn Saffran S

## 2015-08-08 NOTE — Telephone Encounter (Signed)
Patient left VM that colonoscopy could not be done due to being full of stool still. Dr. Cristina Gong told him to repeat prep over 2 day period to clean him out prior to RT/chemo. He is asking for Dr. Ernestina Penna opinion on this. Forwarded message to MD.

## 2015-08-08 NOTE — Op Note (Signed)
[facility] [place], [id number]   OPERATIVE PROCEDURE REPORT  PATIENT: Nathaniel [name], Nathaniel [name]  MR#: [id number] BIRTHDATE: 01/02/1920  GENDER: male ENDOSCOPIST: Arta Silence, MD REFERRED BY:  Ronald Lobo, M.D.; [name], MD PROCEDURE DATE:  08/08/2015 PROCEDURE:   Flexible sigmoidoscopy EUS ASA CLASS:   Class III INDICATIONS:1.  rectal cancer. MEDICATIONS: Monitored anesthesia care  DESCRIPTION OF PROCEDURE:   After the risks benefits and alternatives of the procedure were thoroughly explained, informed consent was obtained.  Throughout the procedure, the patients blood pressure, pulse and oxygen saturations were monitored continuously. Under direct visualization, the [id number] ultraslim colonoscope followed by the radial forward-viewing echoendoscopes  were sequentially introduced through the anus  and advanced to the rectum .  Water was used as necessary to provide an acoustic interface.  Imaging was obtained at 7.5 and 12Mhz. Upon completion of the imaging, water was removed and the patient was sent to the recovery room in satisfactory condition. Estimated blood loss is zero unless otherwise noted in this procedure report.    FINDINGS:   Hard fixed nearly-completely circumferential mass noted in distal rectum upon digital rectal exam.  Upon entry into the rectal vault with the ultraslim colonoscope, there was copious and abundant solid stool.  Colonoscopy aborted.  I tried as best possible to lavage the rectum with water irrigation, in an attempt to facilitate better views of the rectum.  This study was still limited.  Circumferential, friable and ulcerated distal rectal tumor was endoscopically noted.  Via ultrasound, the tumor penetrated through the muscularis propria in multiple places. There is neighboring adenopathy.  STAGING: T3 N1 Mx by endorectal ultrasound (limited study).  ENDOSCOPIC IMPRESSION: Poor prep; unable to do  colonoscopy. Rectal mass (adenocarcinoma on biopsies).  Staging as above.  RECOMMENDATIONS: 1.  Watch for potential complications of procedure. 2.  Patient can follow-up with Dr. Cristina Gong as outpatient.  I'm not sure, based on today's ultrasound results and already-planned chemotherapy and radiation, whether there is great need for a complete colonoscopy, but Dr. Cristina Gong and Dr. Burr Medico can discuss electively as outpatient.   _______________________________ eSigned:  Arta Silence, MD 08/08/2015 8:52 AM   CC:

## 2015-08-08 NOTE — Anesthesia Preprocedure Evaluation (Addendum)
Anesthesia Evaluation  Patient identified by MRN, date of birth, ID band Patient awake    Reviewed: Allergy & Precautions, H&P , NPO status , Patient's Chart, lab work & pertinent test results  Airway Mallampati: II  TM Distance: >3 FB Neck ROM: Full    Dental no notable dental hx.    Pulmonary neg pulmonary ROS,    Pulmonary exam normal breath sounds clear to auscultation       Cardiovascular hypertension, On Medications Normal cardiovascular exam+ dysrhythmias Atrial Fibrillation  Rhythm:Regular Rate:Normal     Neuro/Psych negative neurological ROS  negative psych ROS   GI/Hepatic negative GI ROS, Neg liver ROS,   Endo/Other  negative endocrine ROSHypothyroidism   Renal/GU negative Renal ROS  negative genitourinary   Musculoskeletal negative musculoskeletal ROS (+)   Abdominal   Peds negative pediatric ROS (+)  Hematology negative hematology ROS (+)   Anesthesia Other Findings   Reproductive/Obstetrics negative OB ROS                            Anesthesia Physical Anesthesia Plan  ASA: III  Anesthesia Plan: MAC   Post-op Pain Management:    Induction: Intravenous  Airway Management Planned: Simple Face Mask  Additional Equipment:   Intra-op Plan:   Post-operative Plan:   Informed Consent: I have reviewed the patients History and Physical, chart, labs and discussed the procedure including the risks, benefits and alternatives for the proposed anesthesia with the patient or authorized representative who has indicated his/her understanding and acceptance.   Dental advisory given  Plan Discussed with: CRNA and Surgeon  Anesthesia Plan Comments:        Anesthesia Quick Evaluation

## 2015-08-09 ENCOUNTER — Encounter (HOSPITAL_COMMUNITY): Payer: Self-pay | Admitting: Gastroenterology

## 2015-08-09 DIAGNOSIS — M5136 Other intervertebral disc degeneration, lumbar region: Secondary | ICD-10-CM | POA: Diagnosis not present

## 2015-08-09 DIAGNOSIS — M4310 Spondylolisthesis, site unspecified: Secondary | ICD-10-CM | POA: Diagnosis not present

## 2015-08-09 DIAGNOSIS — M47816 Spondylosis without myelopathy or radiculopathy, lumbar region: Secondary | ICD-10-CM | POA: Diagnosis not present

## 2015-08-09 DIAGNOSIS — I10 Essential (primary) hypertension: Secondary | ICD-10-CM | POA: Diagnosis not present

## 2015-08-09 DIAGNOSIS — M4806 Spinal stenosis, lumbar region: Secondary | ICD-10-CM | POA: Diagnosis not present

## 2015-08-09 DIAGNOSIS — M545 Low back pain: Secondary | ICD-10-CM | POA: Diagnosis not present

## 2015-08-09 NOTE — Progress Notes (Signed)
  Radiation Oncology         [phone] ________________________________  Name: Nathaniel [name] MRN: [id number]  Date: 07/30/2015  DOB: 02/08/1920  Optical Surface Tracking Plan:  Since intensity modulated radiotherapy (IMRT) and 3D conformal radiation treatment methods are predicated on accurate and precise positioning for treatment, intrafraction motion monitoring is medically necessary to ensure accurate and safe treatment delivery.  The ability to quantify intrafraction motion without excessive ionizing radiation dose can only be performed with optical surface tracking. Accordingly, surface imaging offers the opportunity to obtain 3D measurements of patient position throughout IMRT and 3D treatments without excessive radiation exposure.  I am ordering optical surface tracking for this patient's upcoming course of radiotherapy. ________________________________  Kyung Rudd, MD 08/09/2015 8:27 AM    Reference:   Particia Jasper, et al. Surface imaging-based analysis of intrafraction motion for breast radiotherapy patients.Journal of [place], n. 6, nov. 2014. ISSN [id number].   Available at: <http://www.jacmp.org/index.php/jacmp/article/view/4957>.

## 2015-08-12 ENCOUNTER — Other Ambulatory Visit: Payer: Self-pay | Admitting: *Deleted

## 2015-08-12 ENCOUNTER — Ambulatory Visit
Admission: RE | Admit: 2015-08-12 | Discharge: 2015-08-12 | Disposition: A | Discharge status: Home / Self Care | Payer: Medicare Other | Source: Ambulatory Visit | Attending: Radiation Oncology | Admitting: Radiation Oncology

## 2015-08-12 ENCOUNTER — Encounter: Payer: Self-pay | Admitting: Radiation Oncology

## 2015-08-12 VITALS — BP 180/70 | HR 59 | Temp 98.4°F | Resp 20 | Wt 184.0 lb

## 2015-08-12 DIAGNOSIS — Z510 Encounter for antineoplastic radiation therapy: Secondary | ICD-10-CM | POA: Diagnosis not present

## 2015-08-12 DIAGNOSIS — C20 Malignant neoplasm of rectum: Secondary | ICD-10-CM

## 2015-08-12 MED ORDER — MORPHINE SULFATE 4 MG/ML IJ SOLN
2.0000 mg | Freq: Once | INTRAMUSCULAR | Status: AC
Start: 2015-08-12 — End: 2015-08-12
  Administered 2015-08-12: 4 mg via INTRAMUSCULAR
  Filled 2015-08-12: qty 1

## 2015-08-12 NOTE — Progress Notes (Signed)
atient here before 1st tx rectum, pain 10/10 sacrum area, took 1/2 tablet tramadol  At 730am, residing at [facility], pt education done, radiation therapy and you book, my business card, sitz bath, discussed ways to manage side effects, fatigue, pain, skin irritation, bladdr changes, mausea, diarhhea, rectal discomfort, use babay wipes, sitz bath prn, increase protein in diet, may need [place] to 5-6 smaller meals, imodium prn diarrhea 9:36 AM

## 2015-08-12 NOTE — Progress Notes (Signed)
  Radiation Oncology         [phone]   Name: Nathaniel [name] MRN: [id number]   Date: 08/12/2015  DOB: 03-17-1920   Weekly Radiation Therapy Management    ICD-9-CM ICD-10-CM   1. Rectal cancer (HCC) 154.1 C20 morphine 4 MG/ML injection 2 mg    Current Dose: 1.8 Gy  Planned Dose:  50.4 Gy  Narrative  Patient here before 1st tx rectum, pain 10/10 sacrum area, took 1/2 tablet tramadol At 730am, residing at [facility], pt education done, radiation therapy and you book, my business card, sitz bath, discussed ways to manage side effects, fatigue, pain, skin irritation, bladder changes, mausea, diarhhea, rectal discomfort, use babay wipes, sitz bath prn, increase protein in diet, may need [place] to 5-6 smaller meals, imodium prn diarrhea. Took Xeloda today.  .   Set-up films were reviewed. The chart was checked.  Physical Findings  weight is 184 lb (83.462 kg). His oral temperature is 98.4 F (36.9 C). His blood pressure is 180/70 and his pulse is 59. His respiration is 20. . Weight essentially stable.  No significant changes.  Impression The patient is experiencing significant lower back pain while laying on the treatment bed.  Plan Continue treatment as planned. I will prescribe 2 mg of morphine IM.          Sheral Apley Tammi Klippel, M.D.  This document serves as a record of services personally performed by Tyler Pita, MD. It was created on his behalf by Derek Mound, a trained medical scribe. The creation of this record is based on the scribe's personal observations and the provider's statements to them. This document has been checked and approved by the attending provider.

## 2015-08-12 NOTE — Progress Notes (Signed)
Okay to give patient 2mg  IM Morphine x 1, per Dr. Cheron Schaumann, wasted and verified with Elmo Putt RN , gave 2mg  IM Morphine to patient's left deltoid, patient tolerated well, escorted patient to [facility] will attmpte 2nd time to have rad tx to his rectal area ,will try in 20-30 minutes, patient isn't driving, he will call [facility] for transportation, obtained w./c to escort him back up to lobby, patient using hiw walker but difficulty slow gait 10:15 AM

## 2015-08-12 NOTE — Progress Notes (Signed)
Dr. Burr [name] discussed his need for MRI pelvis last week at telephone conversation. MRI needs to be done within 1st 10 days of his RT for best results. MRI ordered as requested by MD.

## 2015-08-12 NOTE — Progress Notes (Signed)
Pain reevaluated, pain now 2/3 when sitting, not sure what it will be when laying on flat surface stated pateint,

## 2015-08-12 NOTE — Progress Notes (Signed)
atient here before 1st tx rectum, pain 10/10 sacrum area, took 1/2 tablet tramadol  At 730am, residing at [facility], pt education done, radiation therapy and you book, my business card, sitz bath, discussed ways to manage side effects, fatigue, pain, skin irritation, bladdr changes, mausea, diarhhea, rectal discomfort, use babay wipes, sitz bath prn, increase protein in diet, may need [place] to 5-6 smaller meals, imodium prn diarrhea 9:36 AM BP 142/76 mmHg  Pulse 64  Temp(Src) 98.4 F (36.9 C) (Oral)  Resp 20  Wt 184 lb (83.462 kg)  Wt Readings from Last 3 Encounters:  08/12/15 184 lb (83.462 kg)  08/07/15 185 lb 3.2 oz (84.006 kg)  07/30/15 185 lb 4.8 oz (84.052 kg)

## 2015-08-13 ENCOUNTER — Encounter: Payer: Self-pay | Admitting: *Deleted

## 2015-08-13 ENCOUNTER — Ambulatory Visit
Admission: RE | Admit: 2015-08-13 | Discharge: 2015-08-13 | Disposition: A | Discharge status: Home / Self Care | Payer: Medicare Other | Source: Ambulatory Visit | Attending: Radiation Oncology | Admitting: Radiation Oncology

## 2015-08-13 ENCOUNTER — Other Ambulatory Visit: Payer: Self-pay | Admitting: *Deleted

## 2015-08-13 ENCOUNTER — Telehealth: Payer: Self-pay | Admitting: *Deleted

## 2015-08-13 DIAGNOSIS — Z510 Encounter for antineoplastic radiation therapy: Secondary | ICD-10-CM | POA: Diagnosis not present

## 2015-08-13 DIAGNOSIS — K5900 Constipation, unspecified: Secondary | ICD-10-CM | POA: Diagnosis not present

## 2015-08-13 DIAGNOSIS — C20 Malignant neoplasm of rectum: Secondary | ICD-10-CM | POA: Diagnosis not present

## 2015-08-13 MED ORDER — HYDROCODONE-ACETAMINOPHEN 5-325 MG PO TABS: 1.0000 | ORAL_TABLET | Freq: Four times a day (QID) | ORAL | Status: DC | PRN

## 2015-08-13 NOTE — Telephone Encounter (Signed)
Oncology Nurse Navigator Documentation  Oncology Nurse Navigator Flowsheets 08/13/2015  Referral date to RadOnc/MedOnc -  Navigator Encounter Type Telephone  Patient Visit Type -  Treatment Phase Treatment #2 RT/Xeloda  Barriers/Navigation Needs Family concerns  Education Pain Management;Other-MRI test  Interventions Coordination of Care  Coordination of Care Radiology-1st available MRI pelvis 08/29/15 at 0945/1000  Education Method -  Support Groups/Services -  Time Spent with Patient -  Reports he can't tolerate lying on table for treatment even with 50 mg Tramadol as premedication. Asking for stronger pain medication and has transportation (will not drive). He can pick up the script tomorrow. Also says he has not heard about MRI scan appointment. 1st available for MRI pelvis is 08/29/15 (will inquire w/MD if this will be OK).

## 2015-08-13 NOTE — Progress Notes (Signed)
[facility]  Clinical Social Work was referred by patient to review and complete healthcare advance directives.  Clinical Social Worker met with patient in [place] office.  The patient designated Lindi Adie as their primary healthcare agent.  Patient also completed healthcare living will.    Clinical Social Worker notarized documents and made copies for patient/family. Clinical Social Worker will send documents to medical records to be scanned into patient's chart. Clinical Social Worker encouraged patient/family to contact with any additional questions or concerns.  Johnnye Lana, MSW, LCSW, OSW-C Clinical Social Worker [facility] [phone]

## 2015-08-14 ENCOUNTER — Telehealth: Payer: Self-pay | Admitting: Hematology

## 2015-08-14 ENCOUNTER — Ambulatory Visit
Admission: RE | Admit: 2015-08-14 | Discharge: 2015-08-14 | Disposition: A | Discharge status: Home / Self Care | Payer: Medicare Other | Source: Ambulatory Visit | Attending: Radiation Oncology | Admitting: Radiation Oncology

## 2015-08-14 ENCOUNTER — Other Ambulatory Visit: Payer: BLUE CROSS/BLUE SHIELD

## 2015-08-14 ENCOUNTER — Telehealth: Payer: Self-pay | Admitting: *Deleted

## 2015-08-14 DIAGNOSIS — Z510 Encounter for antineoplastic radiation therapy: Secondary | ICD-10-CM | POA: Diagnosis not present

## 2015-08-14 DIAGNOSIS — C20 Malignant neoplasm of rectum: Secondary | ICD-10-CM | POA: Diagnosis not present

## 2015-08-14 NOTE — Telephone Encounter (Signed)
Patient called in as he left today and forgot to do his lab but will get it tomorrow

## 2015-08-14 NOTE — Telephone Encounter (Signed)
Received vm call from Dr Carney Living.  Stating that pt has rectal Cancer with partial obstruction & had an unsuccessful colonoscopy due to larg amt of stool/obstruction.  Ultimately prep was not clear.  He wants to know how strongly does Dr Lafe Garin want this colonoscopy done considering pt's age & mobility problems & will it make a big difference in how he is managed.  He also wants to know if Dr Burr Medico would monitor pt with miralax to determine if he is sufficiently empying his bowels.  Dr Cristina Gong reports that he will be out of town a few days but states that Dr Burr Medico can leave a message on confidential vm @ [phone].  Message routed to Dr Burr Medico.

## 2015-08-15 ENCOUNTER — Ambulatory Visit
Admission: RE | Admit: 2015-08-15 | Discharge: 2015-08-15 | Disposition: A | Discharge status: Home / Self Care | Payer: Medicare Other | Source: Ambulatory Visit | Attending: Radiation Oncology | Admitting: Radiation Oncology

## 2015-08-15 ENCOUNTER — Ambulatory Visit
Admission: RE | Admit: 2015-08-15 | Discharge: 2015-08-15 | Disposition: A | Discharge status: Home / Self Care | Payer: BLUE CROSS/BLUE SHIELD | Source: Ambulatory Visit | Attending: Radiation Oncology | Admitting: Radiation Oncology

## 2015-08-15 ENCOUNTER — Encounter: Payer: Self-pay | Admitting: Radiation Oncology

## 2015-08-15 ENCOUNTER — Other Ambulatory Visit (HOSPITAL_BASED_OUTPATIENT_CLINIC_OR_DEPARTMENT_OTHER): Payer: Medicare Other

## 2015-08-15 VITALS — BP 129/70 | HR 55 | Temp 97.7°F | Resp 20 | Wt 180.3 lb

## 2015-08-15 DIAGNOSIS — C20 Malignant neoplasm of rectum: Secondary | ICD-10-CM

## 2015-08-15 DIAGNOSIS — Z510 Encounter for antineoplastic radiation therapy: Secondary | ICD-10-CM | POA: Diagnosis not present

## 2015-08-15 LAB — CBC WITH DIFFERENTIAL/PLATELET
BASO%: 0.6 % (ref 0.0–2.0)
BASOS ABS: 0 10*3/uL (ref 0.0–0.1)
EOS ABS: 0.1 10*3/uL (ref 0.0–0.5)
EOS%: 1.1 % (ref 0.0–7.0)
HCT: 44.3 % (ref 38.4–49.9)
HEMOGLOBIN: 14.4 g/dL (ref 13.0–17.1)
LYMPH%: 23.5 % (ref 14.0–49.0)
MCH: 31.1 pg (ref 27.2–33.4)
MCHC: 32.5 g/dL (ref 32.0–36.0)
MCV: 95.7 fL (ref 79.3–98.0)
MONO#: 0.7 10*3/uL (ref 0.1–0.9)
MONO%: 13.1 % (ref 0.0–14.0)
NEUT#: 3.3 10*3/uL (ref 1.5–6.5)
NEUT%: 61.7 % (ref 39.0–75.0)
Platelets: 231 10*3/uL (ref 140–400)
RBC: 4.63 10*6/uL (ref 4.20–5.82)
RDW: 14 % (ref 11.0–14.6)
WBC: 5.3 10*3/uL (ref 4.0–10.3)
lymph#: 1.3 10*3/uL (ref 0.9–3.3)

## 2015-08-15 LAB — COMPREHENSIVE METABOLIC PANEL
ALBUMIN: 3.8 g/dL (ref 3.5–5.0)
ALK PHOS: 91 U/L (ref 40–150)
ALT: 25 U/L (ref 0–55)
AST: 25 U/L (ref 5–34)
Anion Gap: 13 mEq/L — ABNORMAL HIGH (ref 3–11)
BILIRUBIN TOTAL: 0.96 mg/dL (ref 0.20–1.20)
BUN: 12.7 mg/dL (ref 7.0–26.0)
CO2: 24 meq/L (ref 22–29)
Calcium: 9.5 mg/dL (ref 8.4–10.4)
Chloride: 102 mEq/L (ref 98–109)
Creatinine: 0.8 mg/dL (ref 0.7–1.3)
EGFR: 75 mL/min/{1.73_m2} — AB (ref >90)
GLUCOSE: 99 mg/dL (ref 70–140)
Potassium: 3.8 mEq/L (ref 3.5–5.1)
SODIUM: 139 meq/L (ref 136–145)
TOTAL PROTEIN: 7.1 g/dL (ref 6.4–8.3)

## 2015-08-15 NOTE — Telephone Encounter (Signed)
I have called Dr. Cristina Gong a message: OK to hold colonoscopy for now and I will monitor his BM. I am seeing pt back next Monday.  [name] 08/15/2015

## 2015-08-15 NOTE — Progress Notes (Addendum)
Weekly rad txs 4/28 rectum completed,  Pain 9/11 too0k hydrocodone tab this am 45 minutes before tx,  Says this has helped better than the tramadol,  No nausea, no gs in abdomen, last BM Sunday but cleaned his bowels out [place], Sat and Sunday  With OTC colytey per his Gi MD, tempted U/S and colonoscopy  Last Thursday, only partial procedure done,  appetie pretty good, in w/c,  And uses cane, stays at [facility],,  9:30 AM BP 129/70 mmHg  Pulse 55  Temp(Src) 97.7 F (36.5 C) (Oral)  Resp 20  Wt 180 lb 4.8 oz (81.784 kg)  Wt Readings from Last 3 Encounters:  08/15/15 180 lb 4.8 oz (81.784 kg)  08/12/15 184 lb (83.462 kg)  08/07/15 185 lb 3.2 oz (84.006 kg)

## 2015-08-15 NOTE — Progress Notes (Signed)
  Radiation Oncology         [phone]   Name: Nathaniel [name] MRN: [id number]   Date: 08/15/2015  DOB: 11-Jan-1920     Weekly Radiation Therapy Management    ICD-9-CM ICD-10-CM   1. Rectal cancer (HCC) 154.1 C20     Current Dose: 7.2 Gy  Planned Dose:  50.4 Gy  Narrative  Weekly radiation treatments 4/28 rectum completed. Pain 9/10 took hydrocodone tab this am 45 minutes before treatment. He states this has helped better than the tramadol. He denies nausea and gas in abdomen. Last bowel movement Sunday but cleaned his bowels out Fri, Sat, and Sunday with OTC colytey per his GI MD. His GI recommended he takes Miralax. Attempted ultrasound and colonoscopy last Thursday, but only partial procedure done. His appetite is pretty good. He is in a wheelchair and uses a cane. He stays at [facility].  Set-up films were reviewed. The chart was checked.  Physical Findings  weight is 180 lb 4.8 oz (81.784 kg). His oral temperature is 97.7 F (36.5 C). His blood pressure is 129/70 and his pulse is 55. His respiration is 20. . Weight essentially stable.  No significant changes.  Impression The patient is doing satisfactorily with treatment.  Plan Continue treatment as planned.         Sheral Apley Tammi Klippel, M.D.  This document serves as a record of services personally performed by Tyler Pita, MD. It was created on his behalf by Lendon Collar, a trained medical scribe. The creation of this record is based on the scribe's personal observations and the provider's statements to them. This document has been checked and approved by the attending provider.

## 2015-08-16 ENCOUNTER — Encounter: Payer: Self-pay | Admitting: *Deleted

## 2015-08-16 ENCOUNTER — Encounter: Payer: Self-pay | Admitting: Radiation Oncology

## 2015-08-16 ENCOUNTER — Ambulatory Visit
Admission: RE | Admit: 2015-08-16 | Discharge: 2015-08-16 | Disposition: A | Discharge status: Home / Self Care | Payer: Medicare Other | Source: Ambulatory Visit | Attending: Radiation Oncology | Admitting: Radiation Oncology

## 2015-08-16 ENCOUNTER — Telehealth: Payer: Self-pay | Admitting: *Deleted

## 2015-08-16 DIAGNOSIS — Z510 Encounter for antineoplastic radiation therapy: Secondary | ICD-10-CM | POA: Diagnosis not present

## 2015-08-16 DIAGNOSIS — C20 Malignant neoplasm of rectum: Secondary | ICD-10-CM | POA: Diagnosis not present

## 2015-08-16 LAB — CEA: CEA: 2.9 ng/mL (ref 0.0–5.0)

## 2015-08-16 NOTE — Progress Notes (Signed)
Oncology Nurse Navigator Documentation  Oncology Nurse Navigator Flowsheets 08/16/2015  Referral date to RadOnc/MedOnc -  Navigator Encounter Type Written note  Patient Visit Type Radonc  Treatment Phase Treatment  Barriers/Navigation Needs -  Education -  Interventions Note that MRI was cancelled  Coordination of Care -  Education Method Written  Support Groups/Services -  Time Spent with Patient -  Note given to radiation treatment tech at [facility] that after discussion between Dr. Burr Medico and Dr. Marcello Moores, the MRI has been cancelled. Will be reschedule for after his RT is completed. Also inquired if his new pain pill is working better for him? Central scheduling called to cancel.

## 2015-08-16 NOTE — Telephone Encounter (Signed)
Retrieved VM that was left this morning with update on his back pain. Says the Vicodin has helped "a little". Reports his pain during RT is down from 11 to 9/10. Will forward this to collaborative nurse to discuss this with him on Monday.

## 2015-08-19 ENCOUNTER — Telehealth: Payer: Self-pay | Admitting: Hematology

## 2015-08-19 ENCOUNTER — Ambulatory Visit
Admission: RE | Admit: 2015-08-19 | Discharge: 2015-08-19 | Disposition: A | Discharge status: Home / Self Care | Payer: Medicare Other | Source: Ambulatory Visit | Attending: Radiation Oncology | Admitting: Radiation Oncology

## 2015-08-19 ENCOUNTER — Other Ambulatory Visit (HOSPITAL_BASED_OUTPATIENT_CLINIC_OR_DEPARTMENT_OTHER): Payer: Medicare Other

## 2015-08-19 ENCOUNTER — Encounter: Payer: Self-pay | Admitting: Hematology

## 2015-08-19 ENCOUNTER — Ambulatory Visit (HOSPITAL_BASED_OUTPATIENT_CLINIC_OR_DEPARTMENT_OTHER): Payer: Medicare Other | Admitting: Hematology

## 2015-08-19 VITALS — BP 161/48 | HR 55 | Temp 98.0°F | Resp 18 | Ht 72.0 in | Wt 178.9 lb

## 2015-08-19 DIAGNOSIS — C20 Malignant neoplasm of rectum: Secondary | ICD-10-CM

## 2015-08-19 DIAGNOSIS — M545 Low back pain: Secondary | ICD-10-CM | POA: Diagnosis not present

## 2015-08-19 DIAGNOSIS — Z510 Encounter for antineoplastic radiation therapy: Secondary | ICD-10-CM | POA: Diagnosis not present

## 2015-08-19 LAB — COMPREHENSIVE METABOLIC PANEL
ALT: 17 U/L (ref 0–55)
AST: 20 U/L (ref 5–34)
Albumin: 3.4 g/dL — ABNORMAL LOW (ref 3.5–5.0)
Alkaline Phosphatase: 73 U/L (ref 40–150)
Anion Gap: 9 mEq/L (ref 3–11)
BUN: 15.5 mg/dL (ref 7.0–26.0)
CO2: 24 meq/L (ref 22–29)
Calcium: 8.8 mg/dL (ref 8.4–10.4)
Chloride: 106 mEq/L (ref 98–109)
Creatinine: 0.8 mg/dL (ref 0.7–1.3)
EGFR: 78 mL/min/{1.73_m2} — AB (ref >90)
GLUCOSE: 94 mg/dL (ref 70–140)
POTASSIUM: 4 meq/L (ref 3.5–5.1)
SODIUM: 138 meq/L (ref 136–145)
Total Bilirubin: 0.75 mg/dL (ref 0.20–1.20)
Total Protein: 6.3 g/dL — ABNORMAL LOW (ref 6.4–8.3)

## 2015-08-19 LAB — CBC WITH DIFFERENTIAL/PLATELET
BASO%: 0.8 % (ref 0.0–2.0)
BASOS ABS: 0 10*3/uL (ref 0.0–0.1)
EOS ABS: 0.1 10*3/uL (ref 0.0–0.5)
EOS%: 1.9 % (ref 0.0–7.0)
HCT: 40.6 % (ref 38.4–49.9)
HGB: 13.3 g/dL (ref 13.0–17.1)
LYMPH%: 17.1 % (ref 14.0–49.0)
MCH: 30.8 pg (ref 27.2–33.4)
MCHC: 32.7 g/dL (ref 32.0–36.0)
MCV: 94 fL (ref 79.3–98.0)
MONO#: 0.6 10*3/uL (ref 0.1–0.9)
MONO%: 13.6 % (ref 0.0–14.0)
NEUT%: 66.6 % (ref 39.0–75.0)
NEUTROS ABS: 2.9 10*3/uL (ref 1.5–6.5)
Platelets: 205 10*3/uL (ref 140–400)
RBC: 4.32 10*6/uL (ref 4.20–5.82)
RDW: 14.3 % (ref 11.0–14.6)
WBC: 4.4 10*3/uL (ref 4.0–10.3)
lymph#: 0.8 10*3/uL — ABNORMAL LOW (ref 0.9–3.3)

## 2015-08-19 NOTE — Progress Notes (Signed)
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 male is here because of recently diagnosed rectal cancer. He presents to the clinic by himself.  He has had abnormal bowel movement for the past 2-3 months. It's usually very small amount, loose, every time he urinates, he has a such a small bowel movement. He does not really have a good normal bowel movement. He also reports frequent stool stains on his underwear. He noticed small amount blood in his stool recently, which prompt his seeing GI Dr. Cristina Gong on. He underwent a flexible sigmoidoscopy in the office, which showed a rectal mass, biopsy reviewed adenocarcinoma. He denies significant rectal pain, abdominal discomfort, or other new complaints.  He lives in a independent living facility, he is able to take care of his all ADLs, and does some light housework, shopping, by himself. He uses a walker, still drives. He does get fatigued after activity, his energy level has seemed to be decreasing in over the past few years. No significant change daily. He has decent appetite and eats well.  He had remote history of  ulcerative colitis, which has been in remission. 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  He is widowed, has 5 children, who all live in out states. He does have several stepchildren from his secondary to, and some of them live in [place].  CURRENT TREATMENT: concurrent chemotherapy and radiation with Capecitabine 1500 mg (725mg /m2) twice daily, started on 08/12/2015  INTERIM HISTORY Mr. Bueno returns for follow-up. He has started concurrent chemoradiation 1 week ago. His main complaint is the back pain when he lays on the radiation table, it's quite severe, he initially took tramadol but did not help enough. I give him a prescription of Vicodin, he has been using since last Thursday, with some improvement, but not enough. He has history of spinal stenosis. He otherwise is tolerating treatment relatively well. He is compliant with Xeloda, and tolerates well, no significant nausea, diarrhea, skin rash, no other complaints.  MEDICAL HISTORY:  Past Medical History  Diagnosis Date  . Thyroid disease   . Hypercholesteremia   . HTN (hypertension)   . Hypothyroidism   . Cancer ([place]) 07/16/15    Rectal Cancer=adenocarcinoma  . Allergy   . Atrial fibrillation ([place])   . Pericarditis   . 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 Kidney stone   . Arthritis     SURGICAL HISTORY: Past Surgical History  Procedure Laterality Date  . Shoulder surgery Right 2013    rotator cuff   .  Knee surgery Left 75 years ago  . Ankle fracture surgery Left 81 years ago  . Hernia repair  30 years ago  . Mohs surgery    . Eye surgery    . Eus N/A 08/08/2015    Procedure: LOWER ENDOSCOPIC ULTRASOUND (EUS);  Surgeon: Arta Silence, MD;  Location: [facility] ENDOSCOPY;  Service: Endoscopy;  Laterality: N/A;  . Flexible sigmoidoscopy N/A 08/08/2015    Procedure: FLEXIBLE SIGMOIDOSCOPY;  Surgeon: Arta Silence, MD;  Location: [facility]  ENDOSCOPY;  Service: Endoscopy;  Laterality: N/A;  poor prep    SOCIAL HISTORY: Social History   Social History  . Marital Status: Widowed     Spouse Name: N/A  . Number of Children: 4 daughters and one son, and 5 stepchildren   . Years of Education: N/A   Occupational History  . 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 Smoking status: Never Smoker   . Smokeless tobacco: Not on file  . Alcohol Use: 0.6 oz/week    1 Glasses of wine per week     Comment: 1 glass of wine  . Drug Use: No  . Sexual Activity: Not on file   Other Topics Concern  . Not on file   Social History Narrative    FAMILY HISTORY: Family History  Problem Relation Age of Onset  . Stroke Father   . Cancer Mother     spine ,died of parkinson's    ALLERGIES:  is allergic to cinnamon; ibuprofen; percocet; protonix; and xarelto.  MEDICATIONS:  Current Outpatient Prescriptions  Medication Sig Dispense Refill  . aspirin 325 MG tablet Take 325 mg by mouth daily.     . finasteride (PROSCAR) 5 MG tablet Take 5 mg by mouth daily. Dose is taken at bedtime    . HYDROcodone-acetaminophen (NORCO/VICODIN) 5-325 MG tablet Take 1 tablet by mouth every 6 (six) hours as needed for moderate pain. Take 30 min to 1 hour before MRI 20 tablet 0  . levothyroxine (SYNTHROID, LEVOTHROID) 88 MCG tablet Take 88 mcg by mouth daily before breakfast.     . metoprolol succinate (TOPROL-XL) 25 MG 24 hr tablet TAKE 1/2 TABLET EVERY DAY 45 tablet 0  . XELODA 500 MG tablet Take 1,500 mg by mouth 2 (two) times daily.   2  . traMADol (ULTRAM) 50 MG tablet Take 1 tablet (50 mg total) by mouth every 6 (six) hours as needed. (Patient not taking: Reported on 08/15/2015) 30 tablet 1   No current facility-administered medications for this visit.    REVIEW OF SYSTEMS:   Constitutional: Denies fevers, chills or abnormal night sweats Eyes: Denies blurriness of vision, double vision or watery eyes Ears, nose, mouth,  throat, and face: Denies mucositis or sore throat Respiratory: Denies cough, dyspnea or wheezes Cardiovascular: Denies palpitation, chest discomfort or lower extremity swelling Gastrointestinal:  Denies nausea, heartburn or change in bowel habits Skin: Denies abnormal skin rashes Lymphatics: Denies new lymphadenopathy or easy bruising Neurological:Denies numbness, tingling or new weaknesses Behavioral/Psych: Mood is stable, no new changes  All other systems were reviewed with the patient and are negative.  PHYSICAL EXAMINATION: ECOG PERFORMANCE STATUS: 1 - Symptomatic but completely ambulatory  Filed Vitals:   08/19/15 1021  BP: 161/48  Pulse: 55  Temp: 98 F (36.7 C)  Resp: 18   Filed Weights   08/19/15 1021  Weight: 178 lb 14.4 oz (81.149 kg)    GENERAL:alert, no distress and comfortable SKIN: skin color,  texture, turgor are normal, no rashes or significant lesions EYES: normal, conjunctiva are pink and non-injected, sclera clear OROPHARYNX:no exudate, no erythema and lips, buccal mucosa, and tongue normal  NECK: supple, thyroid normal size, non-tender, without nodularity LYMPH:  no palpable lymphadenopathy in the cervical, axillary or inguinal LUNGS: clear to auscultation and percussion with normal breathing effort HEART: regular rate & rhythm and no murmurs and no lower extremity edema ABDOMEN:abdomen soft, non-tender and normal bowel sounds. Rectal exam revealed an anterior low rectal mass, non-tender, no blood on glove.  Musculoskeletal:no cyanosis of digits and no clubbing  PSYCH: alert & oriented x 3 with fluent speech NEURO: no focal motor/sensory deficits  LABORATORY DATA:  I have reviewed the data as listed CBC Latest Ref Rng 08/19/2015 08/15/2015 08/07/2015  WBC 4.0 - 10.3 10e3/uL 4.4 5.3 6.1  Hemoglobin 13.0 - 17.1 g/dL 13.3 14.4 14.2  Hematocrit 38.4 - 49.9 % 40.6 44.3 42.9  Platelets 140 - 400 10e3/uL 205 231 207    Recent Labs  08/07/15 0933  08/15/15 1032 08/19/15 1005  NA 139 139 138  K 4.3 3.8 4.0  CO2 25 24 24   GLUCOSE 129 99 94  BUN 14.3 12.7 15.5  CREATININE 1.0 0.8 0.8  CALCIUM 9.2 9.5 8.8  PROT 6.8 7.1 6.3*  ALBUMIN 3.6 3.8 3.4*  AST 24 25 20   ALT 17 25 17   ALKPHOS 93 91 73  BILITOT 0.85 0.96 0.75    PATHOLOGY REPORT  Diagnosis 07/16/2015  Rectum, biopsy, mass - INVASIVE ADENOCARCINOMA WITH SIGNET RING CELL FEATURES. - LYMPHOVASCULAR INVASION IS IDENTIFIED. - SEE COMMENT. Microscopic Comment To help evaluate the case, immunohistochemistry stains were performed. The malignant cells are positive for CDX2 and cytokeratin AE1/AE3. There is focal staining for CD56, a neuroendocrine marker. Prostein and S-100 stains are negative. Overall, the features are consistent with invasive adenocarcinoma of colonic origin, with signet ring cell features. The case was discussed with Dr. Cristina Gong on 07/18/15. (JBK:ds 07/18/15)   RADIOGRAPHIC STUDIES: I have personally reviewed the radiological images as listed and agreed with the findings in the report. Nm Pet Image Initial (pi) Skull Base To Thigh  08/06/2015  CLINICAL DATA:  Initial treatment strategy for new diagnosis of rectal cancer. Staging. History of melanoma and remote history of ulcerative colitis. EXAM: NUCLEAR MEDICINE PET SKULL BASE TO THIGH TECHNIQUE: 11 point to mCi F-18 FDG was injected intravenously. Full-ring PET imaging was performed from the skull base to thigh after the radiotracer. CT data was obtained and used for attenuation correction and anatomic localization. FASTING BLOOD GLUCOSE:  Value: 92 mg/dl COMPARISON:  Clinic note of 07/23/2015. FINDINGS: NECK No areas of abnormal hypermetabolism. CHEST Focal hypermetabolism at the distal esophagus is without CT correlate and favored to be physiologic. This measures a S.U.V. max of 5.8, including on image 95/series 4. ABDOMEN/PELVIS Multifocal colonic hypermetabolism is primarily felt to be physiologic. Anal  rectal wall thickening and hypermetabolism, consistent with the clinical history of primary malignancy. This measures a S.U.V. max of 10.9, including on image 183/series 4. No hypermetabolic pelvic sidewall nodes are seen. There is transmural extension and/or a a perirectal node at the 11 o'clock position on image 183/ series 4. This measures 8 mm. Although no high-grade obstruction is seen, there is a large volume of proximal stool. A separate area of more cephalad rectosigmoid junction underdistention and hypermetabolism is identified. This measures a S.U.V. max of 12.7, including on image 166/series 4. SKELETON Osteoarthritis of the left hip is advanced with  concurrent hypermetabolism. CT IMAGES PERFORMED FOR ATTENUATION CORRECTION No cervical adenopathy. Moderate cardiomegaly with multivessel coronary artery atherosclerosis. Pulmonary artery enlargement, outflow tract 3.7 cm. No small bowel obstruction. Mild ectasia of the right common iliac artery. Fat containing right inguinal hernia. Moderate prostatomegaly. Multiple (greater than 10) bladder stones on the order of 8 mm. IMPRESSION: 1. Anorectal primary. Transmural spread and/or perirectal nodal disease, without distant hypermetabolic metastasis. 2. Large colonic stool burden more proximally may represent a component of partial obstruction. No small bowel distention to suggest proximal propagation. 3. Separate area of rectosigmoid junction underdistention and hypermetabolism. Correlate with results of prior colonoscopy. If the prior exam was not able to traverse the primary anal rectal lesion, a synchronous carcinoma cannot be entirely excluded. 4. Prostatomegaly with multiple bladder stones, suggesting a component of bladder outlet obstruction. 5. Cardiomegaly with coronary artery atherosclerosis and pulmonary artery enlargement. The latter finding suggests pulmonary arterial hypertension. Electronically Signed   By: Abigail Miyamoto M.D.   On: 08/06/2015 16:50      Flexible  Sigmoidoscopy 07/16/2015 by Dr. Cristina Gong  impression , an infiltrative no obstructing large mass was found in the distal rectum. The mass was partially circumferential (involving one third of the lumen circumference ). The mass measured 5 cm in length.  Biopsy was obtained.  LOWER ENDOSCOPY [place] 08/08/2015  STAGING: T3 N1 Mx by endorectal ultrasound (limited study). ENDOSCOPIC IMPRESSION: Poor prep; unable to do colonoscopy. Rectal mass (adenocarcinoma on biopsies). Staging as above.    ASSESSMENT & PLAN:  81 year old Caucasian male, with past medical history of ulcerative colitis, hypertension and hypothyroidism, who lives in a independent living, presented with bowel habits change in the mild rectal bleeding.  1. Low rectal adenocarcinoma, cT3N1M0 -I previously reviewed his sigmoidoscopy findings and the biopsy results in great detail with him. -I reviewed his PET CT scan result with him in details. He appears to have regional lymphadenopathy, but no evidence of distant metastasis. -lower endoscopy US showed a T3N1 tumor, unable to do colonoscopy due to poor prep. -He was seen by colorectal surgeon Dr. Marcello Moores, surgical resection would be considered if he tolerates neoadjuvant chemotherapy and radiation. -Due to his advanced age and some comorbidities, I'll consider low-dose chemotherapy capecitabine 1500mg  (725mg /m2, instead of standard 825mg /m2) with concurrent radiation.  -He has tolerated the first week of concurrent chemoradiation well overall, with the main complaint of back pain when he lays on the table, which requires narcotics.  -Lab reviewed with him, normal CBC and CMP. He will continue chemoradiation. -Dr. Marcello Moores will see him after he completes chemotherapy and radiation, and order a pelvic MRI after CCRT   2. Low back pain, secondary to spinal stenosis and rectal pain -He has quite severe low back pain when he lays on the radiation table. -I recommend him to  increase Vicodin to 2 tablets 30 minutes before radiation.  3. HTN, AF, arthritis -He will continue follow-up with his primary care physician -will watch his blood pressure and heart rate to closely during the concurrent chemoradiation.  Plan -Continue concurrent chemoradiation, increased Vicodin to 2 tablets 30 minutes before radiation -Lab weekly -We'll see him every week until he completes chemoradiation   All questions were answered. The patient knows to call the clinic with any problems, questions or concerns. I spent 25 minutes counseling the patient face to face. The total time spent in the appointment was 30 minutes and more than 50% was on counseling.     [name], MD 08/19/2015 10:56 PM

## 2015-08-19 NOTE — Telephone Encounter (Signed)
per pof to sch pt appt-gave pt copy of avs °

## 2015-08-20 ENCOUNTER — Telehealth: Payer: Self-pay | Admitting: Pharmacist

## 2015-08-20 ENCOUNTER — Ambulatory Visit
Admission: RE | Admit: 2015-08-20 | Discharge: 2015-08-20 | Disposition: A | Discharge status: Home / Self Care | Payer: Medicare Other | Source: Ambulatory Visit | Attending: Radiation Oncology | Admitting: Radiation Oncology

## 2015-08-20 DIAGNOSIS — Z510 Encounter for antineoplastic radiation therapy: Secondary | ICD-10-CM | POA: Diagnosis not present

## 2015-08-20 DIAGNOSIS — C20 Malignant neoplasm of rectum: Secondary | ICD-10-CM | POA: Diagnosis not present

## 2015-08-20 NOTE — Telephone Encounter (Signed)
..  Oral Chemotherapy Follow-Up Form  Original Start date of oral chemotherapy: _12/5/16______   Called patient today to follow up regarding patient's oral chemotherapy medication: _Xeloda___  Pt is doing well today with no complaints. Tolerating xeloda well. Only issue is back pain with radiation due to having to lay down.   Pt reports __0__ tablets/doses missed in the last week/month.    Pt reports the following side effects: _none_____    Will follow up and call patient again in __2 weeks_______   Thank you,  Montel Clock, PharmD, [facility]

## 2015-08-21 ENCOUNTER — Other Ambulatory Visit: Payer: Self-pay | Admitting: Cardiology

## 2015-08-21 ENCOUNTER — Other Ambulatory Visit: Payer: BLUE CROSS/BLUE SHIELD

## 2015-08-21 ENCOUNTER — Ambulatory Visit
Admission: RE | Admit: 2015-08-21 | Discharge: 2015-08-21 | Disposition: A | Discharge status: Home / Self Care | Payer: BLUE CROSS/BLUE SHIELD | Source: Ambulatory Visit | Attending: Radiation Oncology | Admitting: Radiation Oncology

## 2015-08-21 ENCOUNTER — Encounter: Payer: Self-pay | Admitting: Radiation Oncology

## 2015-08-21 ENCOUNTER — Ambulatory Visit
Admission: RE | Admit: 2015-08-21 | Discharge: 2015-08-21 | Disposition: A | Discharge status: Home / Self Care | Payer: Medicare Other | Source: Ambulatory Visit | Attending: Radiation Oncology | Admitting: Radiation Oncology

## 2015-08-21 VITALS — BP 145/53 | HR 60 | Temp 97.8°F | Resp 20 | Wt 180.5 lb

## 2015-08-21 DIAGNOSIS — Z510 Encounter for antineoplastic radiation therapy: Secondary | ICD-10-CM | POA: Diagnosis not present

## 2015-08-21 DIAGNOSIS — C20 Malignant neoplasm of rectum: Secondary | ICD-10-CM

## 2015-08-21 NOTE — Progress Notes (Signed)
   Department of Radiation Oncology  Phone:  [phone] Fax:        [phone]  Weekly Treatment Note    Name: Nathaniel [name] Date: 08/21/2015 MRN: [id number] DOB: [birth year]   Diagnosis:     ICD-9-CM ICD-10-CM   1. Rectal cancer ([facility]) 154.1 C20      Current dose: 14.4 Gy  Current fraction: 8   MEDICATIONS: Current Outpatient Prescriptions  Medication Sig Dispense Refill  . aspirin 325 MG tablet Take 325 mg by mouth daily.     . finasteride (PROSCAR) 5 MG tablet Take 5 mg by mouth daily. Dose is taken at bedtime    . HYDROcodone-acetaminophen (NORCO/VICODIN) 5-325 MG tablet Take 1 tablet by mouth every 6 (six) hours as needed for moderate pain. Take 30 min to 1 hour before MRI 20 tablet 0  . levothyroxine (SYNTHROID, LEVOTHROID) 88 MCG tablet Take 88 mcg by mouth daily before breakfast.     . metoprolol succinate (TOPROL-XL) 25 MG 24 hr tablet TAKE 1/2 TABLET EVERY DAY 45 tablet 0  . XELODA 500 MG tablet Take 1,500 mg by mouth 2 (two) times daily.   2  . traMADol (ULTRAM) 50 MG tablet Take 1 tablet (50 mg total) by mouth every 6 (six) hours as needed. (Patient not taking: Reported on 08/15/2015) 30 tablet 1   No current facility-administered medications for this encounter.     ALLERGIES: Cinnamon; Ibuprofen; Percocet; Protonix; and Xarelto   LABORATORY DATA:  Lab Results  Component Value Date   WBC 4.4 08/19/2015   HGB 13.3 08/19/2015   HCT 40.6 08/19/2015   MCV 94.0 08/19/2015   PLT 205 08/19/2015   Lab Results  Component Value Date   NA 138 08/19/2015   K 4.0 08/19/2015   CL 102 03/15/2014   CO2 24 08/19/2015   Lab Results  Component Value Date   ALT 17 08/19/2015   AST 20 08/19/2015   ALKPHOS 73 08/19/2015   BILITOT 0.75 08/19/2015     NARRATIVE: [name] was seen today for weekly treatment management. The chart was checked and the patient's films were reviewed.  Weekly radiation treatments to the rectum, 8 completed. Patient has  clear discarge from bottom. He has had no bowel movement since last Friday. He is taking Miralax twice daily since Monday. He denies pain, nausea, and gas in abdomen. His appetite is okay. He is eating bran muffins in the morning.    PHYSICAL EXAMINATION: weight is 180 lb 8 oz (81.874 kg). His oral temperature is 97.8 F (36.6 C). His blood pressure is 145/53 and his pulse is 60. His respiration is 20.   ASSESSMENT: The patient is doing satisfactorily with treatment. He complains of pain because of his spinal stenosis when he lays of the table during treatment. I mentioned he can doing another planning appointment so he can lay on his stomach instead, but he wants to continue treatment as it is.  PLAN: We will continue with the patient's radiation treatment as planned. I recommend that he takes Colace for his constipation.       This document serves as a record of services personally performed by Kyung Rudd, MD. It was created on his behalf by  Lendon Collar, a trained medical scribe. The creation of this record is based on the scribe's personal observations and the provider's statements to them. This document has been checked and approved by the attending provider.

## 2015-08-21 NOTE — Progress Notes (Addendum)
Weekly rad txs rectum 8 completed, has clear discarge from bottom, no bowel movement since last FRiday, taking, miralax bid since Monday, no pain, no nausea or gas in abdomen, appetite okay,  Eating bran muffins in am 9:26 AM   Wt Readings from Last 3 Encounters:  08/19/15 178 lb 14.4 oz (81.149 kg)  08/15/15 180 lb 4.8 oz (81.784 kg)  08/12/15 184 lb (83.462 kg)  BP 145/53 mmHg  Pulse 60  Temp(Src) 97.8 F (36.6 C) (Oral)  Resp 20  Wt 180 lb 8 oz (81.874 kg)

## 2015-08-22 ENCOUNTER — Ambulatory Visit
Admission: RE | Admit: 2015-08-22 | Discharge: 2015-08-22 | Disposition: A | Discharge status: Home / Self Care | Payer: Medicare Other | Source: Ambulatory Visit | Attending: Radiation Oncology | Admitting: Radiation Oncology

## 2015-08-22 DIAGNOSIS — Z510 Encounter for antineoplastic radiation therapy: Secondary | ICD-10-CM | POA: Diagnosis not present

## 2015-08-22 DIAGNOSIS — C20 Malignant neoplasm of rectum: Secondary | ICD-10-CM | POA: Diagnosis not present

## 2015-08-23 ENCOUNTER — Ambulatory Visit
Admission: RE | Admit: 2015-08-23 | Discharge: 2015-08-23 | Disposition: A | Discharge status: Home / Self Care | Payer: Medicare Other | Source: Ambulatory Visit | Attending: Radiation Oncology | Admitting: Radiation Oncology

## 2015-08-23 DIAGNOSIS — Z510 Encounter for antineoplastic radiation therapy: Secondary | ICD-10-CM | POA: Diagnosis not present

## 2015-08-23 DIAGNOSIS — C20 Malignant neoplasm of rectum: Secondary | ICD-10-CM | POA: Diagnosis not present

## 2015-08-26 ENCOUNTER — Other Ambulatory Visit: Payer: Self-pay | Admitting: *Deleted

## 2015-08-26 ENCOUNTER — Ambulatory Visit
Admission: RE | Admit: 2015-08-26 | Discharge: 2015-08-26 | Disposition: A | Discharge status: Home / Self Care | Payer: Medicare Other | Source: Ambulatory Visit | Attending: Radiation Oncology | Admitting: Radiation Oncology

## 2015-08-26 ENCOUNTER — Encounter: Payer: Self-pay | Admitting: *Deleted

## 2015-08-26 DIAGNOSIS — C20 Malignant neoplasm of rectum: Secondary | ICD-10-CM | POA: Diagnosis not present

## 2015-08-26 DIAGNOSIS — Z510 Encounter for antineoplastic radiation therapy: Secondary | ICD-10-CM | POA: Diagnosis not present

## 2015-08-26 MED ORDER — HYDROCODONE-ACETAMINOPHEN 5-325 MG PO TABS: 1.0000 | ORAL_TABLET | Freq: Four times a day (QID) | ORAL | Status: DC | PRN

## 2015-08-26 NOTE — Telephone Encounter (Signed)
Note from Navigator - requesting refill of hydrocodone.  OK per Dr. Burr Medico - #30.  Patient seeing her on 12/21

## 2015-08-26 NOTE — Progress Notes (Signed)
Oncology Nurse Navigator Documentation  Oncology Nurse Navigator Flowsheets 08/26/2015  Referral date to RadOnc/MedOnc -  Navigator Encounter Type Treatment  Patient Visit Type Radonc  Treatment Phase Treatment # 11 RT w/Xeloda bid  Barriers/Navigation Needs Family concerns--refill on Hydrocodone  Education Other-Informed him to pick up script at front desk tomorrow  Interventions Other-notified collaborative nurse he wants to pick up his Hydrocodone script tomorrow  Coordination of Care -  Education Method Verbal  Support Groups/Services -  Time Spent with Patient 15

## 2015-08-27 ENCOUNTER — Telehealth: Payer: Self-pay | Admitting: Hematology

## 2015-08-27 ENCOUNTER — Ambulatory Visit
Admission: RE | Admit: 2015-08-27 | Discharge: 2015-08-27 | Disposition: A | Discharge status: Home / Self Care | Payer: Medicare Other | Source: Ambulatory Visit | Attending: Radiation Oncology | Admitting: Radiation Oncology

## 2015-08-27 DIAGNOSIS — C20 Malignant neoplasm of rectum: Secondary | ICD-10-CM | POA: Diagnosis not present

## 2015-08-27 DIAGNOSIS — Z510 Encounter for antineoplastic radiation therapy: Secondary | ICD-10-CM | POA: Diagnosis not present

## 2015-08-27 NOTE — Telephone Encounter (Signed)
cld pt to adv of time & date of lab appt

## 2015-08-28 ENCOUNTER — Ambulatory Visit
Admission: RE | Admit: 2015-08-28 | Discharge: 2015-08-28 | Disposition: A | Discharge status: Home / Self Care | Payer: Medicare Other | Source: Ambulatory Visit | Attending: Radiation Oncology | Admitting: Radiation Oncology

## 2015-08-28 ENCOUNTER — Telehealth: Payer: Self-pay | Admitting: *Deleted

## 2015-08-28 ENCOUNTER — Other Ambulatory Visit: Payer: BLUE CROSS/BLUE SHIELD

## 2015-08-28 ENCOUNTER — Encounter: Payer: Self-pay | Admitting: Radiation Oncology

## 2015-08-28 ENCOUNTER — Other Ambulatory Visit (HOSPITAL_BASED_OUTPATIENT_CLINIC_OR_DEPARTMENT_OTHER): Payer: Medicare Other

## 2015-08-28 ENCOUNTER — Ambulatory Visit (HOSPITAL_BASED_OUTPATIENT_CLINIC_OR_DEPARTMENT_OTHER): Payer: Medicare Other | Admitting: Hematology

## 2015-08-28 ENCOUNTER — Encounter: Payer: Self-pay | Admitting: Hematology

## 2015-08-28 ENCOUNTER — Ambulatory Visit: Payer: BLUE CROSS/BLUE SHIELD | Admitting: Hematology

## 2015-08-28 VITALS — BP 135/65 | HR 54 | Temp 97.7°F | Resp 18 | Ht 72.0 in | Wt 182.0 lb

## 2015-08-28 DIAGNOSIS — C20 Malignant neoplasm of rectum: Secondary | ICD-10-CM

## 2015-08-28 DIAGNOSIS — M545 Low back pain: Secondary | ICD-10-CM

## 2015-08-28 DIAGNOSIS — Z510 Encounter for antineoplastic radiation therapy: Secondary | ICD-10-CM | POA: Diagnosis not present

## 2015-08-28 LAB — CBC WITH DIFFERENTIAL/PLATELET
BASO%: 1 % (ref 0.0–2.0)
BASOS ABS: 0 10*3/uL (ref 0.0–0.1)
EOS ABS: 0.1 10*3/uL (ref 0.0–0.5)
EOS%: 2.8 % (ref 0.0–7.0)
HCT: 40.8 % (ref 38.4–49.9)
HGB: 13.1 g/dL (ref 13.0–17.1)
LYMPH%: 10.2 % — AB (ref 14.0–49.0)
MCH: 30.8 pg (ref 27.2–33.4)
MCHC: 32 g/dL (ref 32.0–36.0)
MCV: 96.3 fL (ref 79.3–98.0)
MONO#: 0.5 10*3/uL (ref 0.1–0.9)
MONO%: 14.5 % — AB (ref 0.0–14.0)
NEUT#: 2.6 10*3/uL (ref 1.5–6.5)
NEUT%: 71.5 % (ref 39.0–75.0)
Platelets: 174 10*3/uL (ref 140–400)
RBC: 4.23 10*6/uL (ref 4.20–5.82)
RDW: 14.9 % — AB (ref 11.0–14.6)
WBC: 3.6 10*3/uL — ABNORMAL LOW (ref 4.0–10.3)
lymph#: 0.4 10*3/uL — ABNORMAL LOW (ref 0.9–3.3)

## 2015-08-28 LAB — COMPREHENSIVE METABOLIC PANEL
ALBUMIN: 3.5 g/dL (ref 3.5–5.0)
ALK PHOS: 81 U/L (ref 40–150)
ALT: 19 U/L (ref 0–55)
AST: 24 U/L (ref 5–34)
Anion Gap: 8 mEq/L (ref 3–11)
BUN: 13.5 mg/dL (ref 7.0–26.0)
CHLORIDE: 103 meq/L (ref 98–109)
CO2: 26 mEq/L (ref 22–29)
Calcium: 9 mg/dL (ref 8.4–10.4)
Creatinine: 0.8 mg/dL (ref 0.7–1.3)
EGFR: 77 mL/min/{1.73_m2} — AB (ref >90)
GLUCOSE: 93 mg/dL (ref 70–140)
POTASSIUM: 4.1 meq/L (ref 3.5–5.1)
SODIUM: 138 meq/L (ref 136–145)
Total Bilirubin: 0.8 mg/dL (ref 0.20–1.20)
Total Protein: 6.3 g/dL — ABNORMAL LOW (ref 6.4–8.3)

## 2015-08-28 NOTE — Telephone Encounter (Signed)
Received vm call from pt stating that last night he had 2 BM's with fresh blood that has cleared now & just wanted Dr Burr Medico to know.  He reports having an appt for radiation @ 9 am & lab 1 & 2.  Attempted to call pt back & received vm but unable to leave message.  This message forwarded to Dr Burr Medico & RN.

## 2015-08-28 NOTE — Progress Notes (Signed)
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 male is here because of recently diagnosed rectal cancer. He presents to the clinic by himself.  He has had abnormal bowel movement for the past 2-3 months. It's usually very small amount, loose, every time he urinates, he has a such a small bowel movement. He does not really have a good normal bowel movement. He also reports frequent stool stains on his underwear. He noticed small amount blood in his stool recently, which prompt his seeing GI Dr. Cristina Gong on. He underwent a flexible sigmoidoscopy in the office, which showed a rectal mass, biopsy reviewed adenocarcinoma. He denies significant rectal pain, abdominal discomfort, or other new complaints.  He lives in a independent living facility, he is able to take care of his all ADLs, and does some light housework, shopping, by himself. He uses a walker, still drives. He does get fatigued after activity, his energy level has seemed to be decreasing in over the past few years. No significant change daily. He has decent appetite and eats well.  He had remote history of  ulcerative colitis, which has been in remission. 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  He is widowed, has 5 children, who all live in out states. He does have several stepchildren from his secondary to, and some of them live in [place].  CURRENT TREATMENT: concurrent chemotherapy and radiation with Capecitabine 1500 mg (725mg /m2) twice daily, started on 08/12/2015  INTERIM HISTORY Nathaniel [name] returns for follow-up. He is on week 3 concurrent chemoradiation. He still complains about back pain during the radiation therapy, he takes 2 tablets of Vicodin, which seems to decrease his pain to a tolerable level. He otherwise is tolerating chemotherapy and radiation well, no significant nausea, abdominal discomfort or other complaints. His bowel movement has been less watery, and he passed formed stool once. His appetite is decent, he is eating moderately well. His weight is stable.   MEDICAL HISTORY:  Past Medical History  Diagnosis Date  . Thyroid disease   . Hypercholesteremia   . HTN (hypertension)   . Hypothyroidism   . Cancer ([place]) 07/16/15    Rectal Cancer=adenocarcinoma  . Allergy   . Atrial fibrillation ([place])   . Pericarditis   . 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 Kidney stone   . Arthritis     SURGICAL HISTORY: Past Surgical History  Procedure Laterality Date  . Shoulder surgery Right 2013    rotator cuff   . Knee surgery Left 75 years ago  .  Ankle fracture surgery Left 81 years ago  . Hernia repair  28 years ago  . Mohs surgery    . Eye surgery    . Eus N/A 08/08/2015    Procedure: LOWER ENDOSCOPIC ULTRASOUND (EUS);  Surgeon: Arta Silence, MD;  [facility] END[facility]COPY;  Service: Endoscopy;  Laterality: N/A;  . Flexible sigmoidoscopy N/A 08/08/2015    Procedure: FLEXIBLE SIGMOIDOSCOPY;  Surgeon: Arta Silence, MD;  Location: [facility] ENDOSCOPY;  Service: Endoscopy;  Laterality: N/A;   poor prep    SOCIAL HISTORY: Social History   Social History  . Marital Status: Widowed     Spouse Name: N/A  . Number of Children: 4 daughters and one son, and 5 stepchildren   . Years of Education: N/A   Occupational History  . 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 Smoking status: Never Smoker   . Smokeless tobacco: Not on file  . Alcohol Use: 0.6 oz/week    1 Glasses of wine per week     Comment: 1 glass of wine  . Drug Use: No  . Sexual Activity: Not on file   Other Topics Concern  . Not on file   Social History Narrative    FAMILY HISTORY: Family History  Problem Relation Age of Onset  . Stroke Father   . Cancer Mother     spine ,died of parkinson's    ALLERGIES:  is allergic to cinnamon; ibuprofen; percocet; protonix; and xarelto.  MEDICATIONS:  Current Outpatient Prescriptions  Medication Sig Dispense Refill  . aspirin 325 MG tablet Take 325 mg by mouth daily.     . finasteride (PROSCAR) 5 MG tablet Take 5 mg by mouth daily. Dose is taken at bedtime    . HYDROcodone-acetaminophen (NORCO/VICODIN) 5-325 MG tablet Take 1 tablet by mouth every 6 (six) hours as needed for moderate pain. Take 30 min to 1 hour before MRI 30 tablet 0  . levothyroxine (SYNTHROID, LEVOTHROID) 88 MCG tablet Take 88 mcg by mouth daily before breakfast.     . metoprolol succinate (TOPROL-XL) 25 MG 24 hr tablet Take 0.5 tablets (12.5 mg total) by mouth daily. 45 tablet 2  . XELODA 500 MG tablet Take 1,500 mg by mouth 2 (two) times daily.   2   No current facility-administered medications for this visit.    REVIEW OF SYSTEMS:   Constitutional: Denies fevers, chills or abnormal night sweats Eyes: Denies blurriness of vision, double vision or watery eyes Ears, nose, mouth, throat, and face: Denies mucositis or sore throat Respiratory: Denies cough, dyspnea or wheezes Cardiovascular: Denies palpitation, chest discomfort or lower extremity  swelling Gastrointestinal:  Denies nausea, heartburn or change in bowel habits Skin: Denies abnormal skin rashes Lymphatics: Denies new lymphadenopathy or easy bruising Neurological:Denies numbness, tingling or new weaknesses Behavioral/Psych: Mood is stable, no new changes  All other systems were reviewed with the patient and are negative.  PHYSICAL EXAMINATION: ECOG PERFORMANCE STATUS: 1 - Symptomatic but completely ambulatory  Filed Vitals:   08/28/15 1110  BP: 135/65  Pulse: 54  Temp: 97.7 F (36.5 C)  Resp: 18   Filed Weights   08/28/15 1110  Weight: 182 lb (82.555 kg)    GENERAL:alert, no distress and comfortable SKIN: skin color, texture, turgor are normal, no rashes or significant lesions EYES: normal, conjunctiva are pink and non-injected, sclera clear OROPHARYNX:no exudate, no erythema and lips, buccal mucosa, and tongue normal  NECK: supple, thyroid normal size, non-tender,  without nodularity LYMPH:  no palpable lymphadenopathy in the cervical, axillary or inguinal LUNGS: clear to auscultation and percussion with normal breathing effort HEART: regular rate & rhythm and no murmurs and no lower extremity edema ABDOMEN:abdomen soft, non-tender and normal bowel sounds. Rectal exam revealed an anterior low rectal mass, non-tender, no blood on glove.  Musculoskeletal:no cyanosis of digits and no clubbing  PSYCH: alert & oriented x 3 with fluent speech NEURO: no focal motor/sensory deficits  LABORATORY DATA:  I have reviewed the data as listed CBC Latest Ref Rng 08/28/2015 08/19/2015 08/15/2015  WBC 4.0 - 10.3 10e3/uL 3.6(L) 4.4 5.3  Hemoglobin 13.0 - 17.1 g/dL 13.1 13.3 14.4  Hematocrit 38.4 - 49.9 % 40.8 40.6 44.3  Platelets 140 - 400 10e3/uL 174 205 231    Recent Labs  08/15/15 1032 08/19/15 1005 08/28/15 0958  NA 139 138 138  K 3.8 4.0 4.1  CO2 24 24 26   GLUCOSE 99 94 93  BUN 12.7 15.5 13.5  CREATININE 0.8 0.8 0.8  CALCIUM 9.5 8.8 9.0  PROT 7.1 6.3*  6.3*  ALBUMIN 3.8 3.4* 3.5  AST 25 20 24   ALT 25 17 19   ALKPHOS 91 73 81  BILITOT 0.96 0.75 0.80    PATHOLOGY REPORT  Diagnosis 07/16/2015  Rectum, biopsy, mass - INVASIVE ADENOCARCINOMA WITH SIGNET RING CELL FEATURES. - LYMPHOVASCULAR INVASION IS IDENTIFIED. - SEE COMMENT. Microscopic Comment To help evaluate the case, immunohistochemistry stains were performed. The malignant cells are positive for CDX2 and cytokeratin AE1/AE3. There is focal staining for CD56, a neuroendocrine marker. Prostein and S-100 stains are negative. Overall, the features are consistent with invasive adenocarcinoma of colonic origin, with signet ring cell features. The case was discussed with Dr. Cristina Gong on 07/18/15. (JBK:ds 07/18/15)   RADIOGRAPHIC STUDIES: I have personally reviewed the radiological images as listed and agreed with the findings in the report. Nm Pet Image Initial (pi) Skull Base To Thigh  08/06/2015  CLINICAL DATA:  Initial treatment strategy for new diagnosis of rectal cancer. Staging. History of melanoma and remote history of ulcerative colitis. EXAM: NUCLEAR MEDICINE PET SKULL BASE TO THIGH TECHNIQUE: 11 point to mCi F-18 FDG was injected intravenously. Full-ring PET imaging was performed from the skull base to thigh after the radiotracer. CT data was obtained and used for attenuation correction and anatomic localization. FASTING BLOOD GLUCOSE:  Value: 92 mg/dl COMPARISON:  Clinic note of 07/23/2015. FINDINGS: NECK No areas of abnormal hypermetabolism. CHEST Focal hypermetabolism at the distal esophagus is without CT correlate and favored to be physiologic. This measures a S.U.V. max of 5.8, including on image 95/series 4. ABDOMEN/PELVIS Multifocal colonic hypermetabolism is primarily felt to be physiologic. Anal rectal wall thickening and hypermetabolism, consistent with the clinical history of primary malignancy. This measures a S.U.V. max of 10.9, including on image 183/series 4. No  hypermetabolic pelvic sidewall nodes are seen. There is transmural extension and/or a a perirectal node at the 11 o'clock position on image 183/ series 4. This measures 8 mm. Although no high-grade obstruction is seen, there is a large volume of proximal stool. A separate area of more cephalad rectosigmoid junction underdistention and hypermetabolism is identified. This measures a S.U.V. max of 12.7, including on image 166/series 4. SKELETON Osteoarthritis of the left hip is advanced with concurrent hypermetabolism. CT IMAGES PERFORMED FOR ATTENUATION CORRECTION No cervical adenopathy. Moderate cardiomegaly with multivessel coronary artery atherosclerosis. Pulmonary artery enlargement, outflow tract 3.7 cm. No small bowel obstruction. Mild ectasia of the right common iliac  artery. Fat containing right inguinal hernia. Moderate prostatomegaly. Multiple (greater than 10) bladder stones on the order of 8 mm. IMPRESSION: 1. Anorectal primary. Transmural spread and/or perirectal nodal disease, without distant hypermetabolic metastasis. 2. Large colonic stool burden more proximally may represent a component of partial obstruction. No small bowel distention to suggest proximal propagation. 3. Separate area of rectosigmoid junction underdistention and hypermetabolism. Correlate with results of prior colonoscopy. If the prior exam was not able to traverse the primary anal rectal lesion, a synchronous carcinoma cannot be entirely excluded. 4. Prostatomegaly with multiple bladder stones, suggesting a component of bladder outlet obstruction. 5. Cardiomegaly with coronary artery atherosclerosis and pulmonary artery enlargement. The latter finding suggests pulmonary arterial hypertension. Electronically Signed   By: Abigail Miyamoto M.D.   On: 08/06/2015 16:50     Flexible  Sigmoidoscopy 07/16/2015 by Dr. Cristina Gong  impression , an infiltrative no obstructing large mass was found in the distal rectum. The mass was partially  circumferential (involving one third of the lumen circumference ). The mass measured 5 cm in length.  Biopsy was obtained.  LOWER ENDOSCOPY [place] 08/08/2015  STAGING: T3 N1 Mx by endorectal ultrasound (limited study). ENDOSCOPIC IMPRESSION: Poor prep; unable to do colonoscopy. Rectal mass (adenocarcinoma on biopsies). Staging as above.    ASSESSMENT & PLAN:  81 year old Caucasian male, with past medical history of ulcerative colitis, hypertension and hypothyroidism, who lives in a independent living, presented with bowel habits change in the mild rectal bleeding.  1. Low rectal adenocarcinoma, cT3N1M0 -I previously reviewed his sigmoidoscopy findings and the biopsy results in great detail with him. -I reviewed his PET CT scan result with him in details. He appears to have regional lymphadenopathy, but no evidence of distant metastasis. -lower endoscopy US showed a T3N1 tumor, unable to do colonoscopy due to poor prep. -He was seen by colorectal surgeon Dr. Marcello Moores, surgical resection would be considered if he tolerates neoadjuvant chemotherapy and radiation. -Due to his advanced age and some comorbidities, I'll consider low-dose chemotherapy capecitabine 1500mg  (725mg /m2, instead of standard 825mg /m2) with concurrent radiation.  -He has tolerated concurrent chemoradiation well overall, with the main complaint of back pain when he lays on the table, which requires narcotics.  -Lab reviewed with him, mild leukopenia, ANC normal, CMP normal. He will continue chemoradiation. -Dr. Marcello Moores will see him after he completes chemotherapy and radiation, and order a pelvic MRI after CCRT   2. Low back pain, secondary to spinal stenosis and rectal pain -controlled by vicodin, he takes 2 tablets 1 hour before his radiation.  3. HTN, AF, arthritis -He will continue follow-up with his primary care physician -will watch his blood pressure and heart rate to closely during the concurrent  chemoradiation.  Plan -Continue concurrent chemoradiation, take Vicodin 2 tablets 1 hour before radiation -Lab weekly -We'll see him every week until he completes chemoradiation   All questions were answered. The patient knows to call the clinic with any problems, questions or concerns. I spent 15 minutes counseling the patient face to face. The total time spent in the appointment was 20 minutes and more than 50% was on counseling.     [name], MD 08/28/2015 11:18 AM

## 2015-08-29 ENCOUNTER — Ambulatory Visit
Admission: RE | Admit: 2015-08-29 | Discharge: 2015-08-29 | Disposition: A | Discharge status: Home / Self Care | Payer: Medicare Other | Source: Ambulatory Visit | Attending: Radiation Oncology | Admitting: Radiation Oncology

## 2015-08-29 ENCOUNTER — Ambulatory Visit (HOSPITAL_COMMUNITY): Payer: BLUE CROSS/BLUE SHIELD

## 2015-08-29 ENCOUNTER — Telehealth: Payer: Self-pay | Admitting: *Deleted

## 2015-08-29 DIAGNOSIS — Z510 Encounter for antineoplastic radiation therapy: Secondary | ICD-10-CM | POA: Diagnosis not present

## 2015-08-29 DIAGNOSIS — C20 Malignant neoplasm of rectum: Secondary | ICD-10-CM | POA: Diagnosis not present

## 2015-08-29 NOTE — Telephone Encounter (Signed)
Received call from pt stating that he had more rectal bleeding during night.  Stated had 4 episodes last night without stools;  1 episode this am with small amount of formed stools.  Pt would like to know what causing the bleeding.  Spoke with pt and was informed that he just had another BM with NO Blood noted.  Pt has daily radiation; pt sees Dr. Lisbeth Renshaw after radiation on 12/23.  Instructed pt to inform Dr. Lisbeth Renshaw of the rectal bleeding for further evaluation.  Pt voiced understanding. Pt's  Phone    [phone].

## 2015-08-30 ENCOUNTER — Ambulatory Visit
Admission: RE | Admit: 2015-08-30 | Discharge: 2015-08-30 | Disposition: A | Discharge status: Home / Self Care | Payer: Medicare Other | Source: Ambulatory Visit | Attending: Radiation Oncology | Admitting: Radiation Oncology

## 2015-08-30 ENCOUNTER — Encounter: Payer: Self-pay | Admitting: Radiation Oncology

## 2015-08-30 VITALS — BP 111/50 | HR 60 | Temp 97.9°F | Resp 18 | Ht 72.0 in | Wt 181.9 lb

## 2015-08-30 DIAGNOSIS — Z510 Encounter for antineoplastic radiation therapy: Secondary | ICD-10-CM | POA: Diagnosis not present

## 2015-08-30 DIAGNOSIS — C20 Malignant neoplasm of rectum: Secondary | ICD-10-CM | POA: Diagnosis not present

## 2015-08-30 NOTE — Progress Notes (Signed)
Nathaniel [name] has completed 15 fractions to his rectum.  He denies pain today.  He does take 2 Vicodin before treatments due to back pain on the treatment table.  He is taking Xeloda.  He reports his appetite is OK but he is eating less than he used to.  He denies having nausea.  He reports having 1 bowel movement last night compared to usually have 4 per night.  He reports his bowel movements have been like "mud" but he has had some formed movements.  His last bm was last night.  He reports having rectal bleeding (about a tablespoon) on Tuesday and Wednesday night.  He reports having fatigue and reports he is using his wheelchair more now.  He reports his rectal area is sore and is using unscented baby wipes.  BP 111/50 mmHg  Pulse 60  Temp(Src) 97.9 F (36.6 C) (Oral)  Resp 18  Ht 6' (1.829 m)  Wt 181 lb 14.4 oz (82.509 kg)  BMI 24.66 kg/m2  SpO2 96%   Wt Readings from Last 3 Encounters:  08/30/15 181 lb 14.4 oz (82.509 kg)  08/28/15 182 lb (82.555 kg)  08/21/15 180 lb 8 oz (81.874 kg)

## 2015-08-30 NOTE — Progress Notes (Signed)
Department of Radiation Oncology  Phone:  [phone] Fax:        [phone]  Weekly Treatment Note    Name: Nathaniel [name] Date: 08/30/2015 MRN: [id number] DOB: 07/13/1920   Diagnosis:     ICD-9-CM ICD-10-CM   1. Rectal cancer ([facility]) 154.1 C20      Current dose: 27 Gy  Current fraction: 15   MEDICATIONS: Current Outpatient Prescriptions  Medication Sig Dispense Refill  . aspirin 325 MG tablet Take 325 mg by mouth daily.     . finasteride (PROSCAR) 5 MG tablet Take 5 mg by mouth daily. Dose is taken at bedtime    . HYDROcodone-acetaminophen (NORCO/VICODIN) 5-325 MG tablet Take 1 tablet by mouth every 6 (six) hours as needed for moderate pain. Take 30 min to 1 hour before MRI 30 tablet 0  . levothyroxine (SYNTHROID, LEVOTHROID) 88 MCG tablet Take 88 mcg by mouth daily before breakfast.     . metoprolol succinate (TOPROL-XL) 25 MG 24 hr tablet Take 0.5 tablets (12.5 mg total) by mouth daily. 45 tablet 2  . XELODA 500 MG tablet Take 1,500 mg by mouth 2 (two) times daily.   2   No current facility-administered medications for this encounter.     ALLERGIES: Cinnamon; Ibuprofen; Percocet; Protonix; and Xarelto   LABORATORY DATA:  Lab Results  Component Value Date   WBC 3.6* 08/28/2015   HGB 13.1 08/28/2015   HCT 40.8 08/28/2015   MCV 96.3 08/28/2015   PLT 174 08/28/2015   Lab Results  Component Value Date   NA 138 08/28/2015   K 4.1 08/28/2015   CL 102 03/15/2014   CO2 26 08/28/2015   Lab Results  Component Value Date   ALT 19 08/28/2015   AST 24 08/28/2015   ALKPHOS 81 08/28/2015   BILITOT 0.80 08/28/2015     NARRATIVE: Nathaniel [name] was seen today for weekly treatment management. The chart was checked and the patient's films were reviewed.  Nathaniel [name] has completed 15 fractions to his rectum. He denies pain today. He does take 2 Vicodin before treatments due to back pain on the treatment table. He is taking Xeloda. He reports his  appetite is OK but he is eating less than he used to. He denies having nausea. He reports having 1 bowel movement last night compared to usually having 4 per night. He reports his bowel movements have been like "mud" but he has had some formed movements. His last bm was last night. He reports having rectal bleeding (about a tablespoon) on Tuesday and Wednesday night. He reports having fatigue and reports he is using his wheelchair more often now. He reports his rectal area is sore and is using unscented baby wipes. A few weeks ago, he had constipation and he too Miralax, which helped.  PHYSICAL EXAMINATION: height is 6' (1.829 m) and weight is 181 lb 14.4 oz (82.509 kg). His oral temperature is 97.9 F (36.6 C). His blood pressure is 111/50 and his pulse is 60. His respiration is 18 and oxygen saturation is 96%.  He presents to the clinic in a wheelchair, but also uses a cane.  ASSESSMENT: The patient is doing satisfactorily with treatment. I advised him that if his rectal bleeding worsens, he is to check himself into the ED. However, I believe that this has run its course.  PLAN: We will continue with the patient's radiation treatment as planned.  This document serves as a record of services personally performed by Jenny Reichmann  Lisbeth Renshaw, MD. It was created on his behalf by Darcus Austin, a trained medical scribe. The creation of this record is based on the scribe's personal observations and the provider's statements to them. This document has been checked and approved by the attending provider.

## 2015-08-30 NOTE — Progress Notes (Signed)
  Radiation Oncology         [phone] ________________________________  Name: Nathaniel [name] MRN: [id number]  Date: 08/28/2015  DOB: [birth year]  COMPLEX SIMULATION  NOTE  Diagnosis: rectal cancer  Narrative The patient has initially been planned to receive a course of radiation treatment to a dose of 45 Gy in 25 fractions at 1.8 gray per fraction. The patient will now receive a boost to the high risk target volume for an additional 5.4 Gy. This will be delivered in 3 fractions at 1.8 Gy per fraction and a cone down boost technique will be utilized. To accomplish this, an additional 4 customized blocks have been designed for this purpose. A complex isodose plan is requested to ensure that the high-risk target region receives the appropriate radiation dose and that the nearby normal structures continue to be appropriately spared. The patient's final total dose therefore will be 50.4 Gy.   ________________________________ ------------------------------------------------  Jodelle Gross, MD, PhD

## 2015-09-03 ENCOUNTER — Ambulatory Visit
Admission: RE | Admit: 2015-09-03 | Discharge: 2015-09-03 | Disposition: A | Discharge status: Home / Self Care | Payer: Medicare Other | Source: Ambulatory Visit | Attending: Radiation Oncology | Admitting: Radiation Oncology

## 2015-09-03 DIAGNOSIS — C20 Malignant neoplasm of rectum: Secondary | ICD-10-CM | POA: Diagnosis not present

## 2015-09-03 DIAGNOSIS — Z510 Encounter for antineoplastic radiation therapy: Secondary | ICD-10-CM | POA: Diagnosis not present

## 2015-09-03 MED ORDER — SONAFINE EX EMUL
1.0000 "application " | Freq: Two times a day (BID) | CUTANEOUS | Status: DC
  Administered 2015-09-03: 1 via TOPICAL
  Filled 2015-09-03: qty 45

## 2015-09-03 NOTE — Progress Notes (Signed)
Patient arrived via w/c c/o pain and irritation on left groin and bottom, checked patient's skin, very excoriated, thinning on left groin, and bottom has abrasion on buttocks, gave and applied sonafine cream to those areas, with instructions to only apply after radiation txs and bedtime prn, , he is using baby wipes but not the sitz baths that was given, stated he has a hand held shower and uses that instead prn, will see MD and another RN fFriday after tx, assisted patient with his depends, and clothes and escorted him to the lobby via w/c,  9:54 AM

## 2015-09-04 ENCOUNTER — Encounter: Payer: Self-pay | Admitting: Nurse Practitioner

## 2015-09-04 ENCOUNTER — Other Ambulatory Visit (HOSPITAL_BASED_OUTPATIENT_CLINIC_OR_DEPARTMENT_OTHER): Payer: Medicare Other

## 2015-09-04 ENCOUNTER — Ambulatory Visit
Admission: RE | Admit: 2015-09-04 | Discharge: 2015-09-04 | Disposition: A | Discharge status: Home / Self Care | Payer: Medicare Other | Source: Ambulatory Visit | Attending: Radiation Oncology | Admitting: Radiation Oncology

## 2015-09-04 ENCOUNTER — Ambulatory Visit (HOSPITAL_BASED_OUTPATIENT_CLINIC_OR_DEPARTMENT_OTHER): Payer: Medicare Other | Admitting: Nurse Practitioner

## 2015-09-04 VITALS — BP 126/56 | HR 58 | Temp 97.7°F | Resp 18 | Ht 72.0 in | Wt 182.2 lb

## 2015-09-04 DIAGNOSIS — C20 Malignant neoplasm of rectum: Secondary | ICD-10-CM | POA: Diagnosis not present

## 2015-09-04 DIAGNOSIS — M545 Low back pain: Secondary | ICD-10-CM | POA: Diagnosis not present

## 2015-09-04 DIAGNOSIS — Z510 Encounter for antineoplastic radiation therapy: Secondary | ICD-10-CM | POA: Diagnosis not present

## 2015-09-04 DIAGNOSIS — K6289 Other specified diseases of anus and rectum: Secondary | ICD-10-CM | POA: Diagnosis not present

## 2015-09-04 LAB — COMPREHENSIVE METABOLIC PANEL
ALT: 20 U/L (ref 0–55)
AST: 22 U/L (ref 5–34)
Albumin: 3.3 g/dL — ABNORMAL LOW (ref 3.5–5.0)
Alkaline Phosphatase: 81 U/L (ref 40–150)
Anion Gap: 9 mEq/L (ref 3–11)
BILIRUBIN TOTAL: 0.68 mg/dL (ref 0.20–1.20)
BUN: 14.4 mg/dL (ref 7.0–26.0)
CO2: 24 meq/L (ref 22–29)
Calcium: 8.7 mg/dL (ref 8.4–10.4)
Chloride: 105 mEq/L (ref 98–109)
Creatinine: 0.8 mg/dL (ref 0.7–1.3)
EGFR: 77 mL/min/{1.73_m2} — AB (ref >90)
GLUCOSE: 95 mg/dL (ref 70–140)
POTASSIUM: 4.2 meq/L (ref 3.5–5.1)
SODIUM: 138 meq/L (ref 136–145)
TOTAL PROTEIN: 6 g/dL — AB (ref 6.4–8.3)

## 2015-09-04 LAB — CBC WITH DIFFERENTIAL/PLATELET
BASO%: 1 % (ref 0.0–2.0)
Basophils Absolute: 0 10*3/uL (ref 0.0–0.1)
EOS ABS: 0.2 10*3/uL (ref 0.0–0.5)
EOS%: 4.1 % (ref 0.0–7.0)
HCT: 36.5 % — ABNORMAL LOW (ref 38.4–49.9)
HGB: 12 g/dL — ABNORMAL LOW (ref 13.0–17.1)
LYMPH%: 7.9 % — AB (ref 14.0–49.0)
MCH: 31.5 pg (ref 27.2–33.4)
MCHC: 32.8 g/dL (ref 32.0–36.0)
MCV: 96.1 fL (ref 79.3–98.0)
MONO#: 0.7 10*3/uL (ref 0.1–0.9)
MONO%: 16.8 % — AB (ref 0.0–14.0)
NEUT%: 70.2 % (ref 39.0–75.0)
NEUTROS ABS: 2.8 10*3/uL (ref 1.5–6.5)
Platelets: 163 10*3/uL (ref 140–400)
RBC: 3.8 10*6/uL — AB (ref 4.20–5.82)
RDW: 14.7 % — AB (ref 11.0–14.6)
WBC: 4 10*3/uL (ref 4.0–10.3)
lymph#: 0.3 10*3/uL — ABNORMAL LOW (ref 0.9–3.3)

## 2015-09-04 NOTE — Progress Notes (Signed)
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 male is here because of recently diagnosed rectal cancer. He presents to the clinic by himself.  He has had abnormal bowel movement for the past 2-3 months. It's usually very small amount, loose, every time he urinates, he has a such a small bowel movement. He does not really have a good normal bowel movement. He also reports frequent stool stains on his underwear. He noticed small amount blood in his stool recently, which prompt his seeing GI Dr. Cristina Gong on. He underwent a flexible sigmoidoscopy in the office, which showed a rectal mass, biopsy reviewed adenocarcinoma. He denies significant rectal pain, abdominal discomfort, or other new complaints.  He lives in a independent living facility, he is able to take care of his all ADLs, and does some light housework, shopping, by himself. He uses a walker, still drives. He does get fatigued after activity, his energy level has seemed to be decreasing in over the past few years. No significant change daily. He has decent appetite and eats well.  He had remote history of  ulcerative colitis, which has been in remission. 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  He is widowed, has 5 children, who all live in out states. He does have several stepchildren from his secondary to, and some of them live in [place].  CURRENT TREATMENT: concurrent chemotherapy and radiation with Capecitabine 1500 mg (725mg /m2) twice daily, started on 08/12/2015  INTERIM HISTORY Mr. Scholtes returns for follow-up. He is on week 4 of concurrent chemoradiation with capecitabine 1500mg  BID. So far he is doing well. He continues to have loose stools that are occasionally bloody. He has rectal irritation, and was prescribed sonafine gel to apply to this area. It is calming, but not particularly long lasting. He takes 2 tablets of vicodin for back pain 1 hr prior to radiation therapy and this makes it manageable. He denies fevers, chills, nausea, or vomiting. The capecitabine causes no mouth sores or rashes. His appetite is down, but he is maintaining his weight well.   MEDICAL HISTORY:  Past Medical History  Diagnosis Date  . Thyroid disease   . Hypercholesteremia   . HTN (hypertension)   . Hypothyroidism   . Cancer ([place]) 07/16/15    Rectal Cancer=adenocarcinoma  . Allergy   . Atrial fibrillation ([place])   . Pericarditis   . 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 Kidney stone   . Arthritis     SURGICAL HISTORY: Past Surgical History  Procedure Laterality Date  .  Shoulder surgery Right 2013    rotator cuff   . Knee surgery Left 75 years ago  . Ankle fracture surgery Left 81 years ago  . Hernia repair  28 years ago  . Mohs surgery    . Eye surgery    . Eus N/A 08/08/2015    Procedure: LOWER ENDOSCOPIC ULTRASOUND (EUS);  Surgeon: Arta Silence, MD;  Location: [facility] ENDOSCOPY;  Service: Endoscopy;  Laterality: N/A;  . Flexible sigmoidoscopy N/A 08/08/2015    Procedure: FLEXIBLE SIGMOIDOSCOPY;  Surgeon:  Arta Silence, MD;  Location[facility] END[facility]COPY;  Service: Endoscopy;  Laterality: N/A;  poor prep    SOCIAL HISTORY: Social History   Social History  . Marital Status: Widowed     Spouse Name: N/A  . Number of Children: 4 daughters and one son, and 5 stepchildren   . Years of Education: N/A   Occupational History  . 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 Smoking status: Never Smoker   . Smokeless tobacco: Not on file  . Alcohol Use: 0.6 oz/week    1 Glasses of wine per week     Comment: 1 glass of wine  . Drug Use: No  . Sexual Activity: Not on file   Other Topics Concern  . Not on file   Social History Narrative    FAMILY HISTORY: Family History  Problem Relation Age of Onset  . Stroke Father   . Cancer Mother     spine ,died of parkinson's    ALLERGIES:  is allergic to cinnamon; ibuprofen; percocet; protonix; and xarelto.  MEDICATIONS:  Current Outpatient Prescriptions  Medication Sig Dispense Refill  . aspirin 325 MG tablet Take 325 mg by mouth daily.     . finasteride (PROSCAR) 5 MG tablet Take 5 mg by mouth daily. Dose is taken at bedtime    . levothyroxine (SYNTHROID, LEVOTHROID) 88 MCG tablet Take 88 mcg by mouth daily before breakfast.     . metoprolol succinate (TOPROL-XL) 25 MG 24 hr tablet Take 0.5 tablets (12.5 mg total) by mouth daily. 45 tablet 2  . Wound Dressings (SONAFINE) Apply 1 application topically 2 (two) times daily.    . XELODA 500 MG tablet Take 1,500 mg by mouth 2 (two) times daily.   2  . HYDROcodone-acetaminophen (NORCO/VICODIN) 5-325 MG tablet Take 1 tablet by mouth every 6 (six) hours as needed for moderate pain. Take 30 min to 1 hour before MRI (Patient not taking: Reported on 09/04/2015) 30 tablet 0   No current facility-administered medications for this visit.    REVIEW OF SYSTEMS:   Constitutional: Denies fevers, chills or abnormal night sweats Eyes: Denies blurriness of vision, double vision or  watery eyes Ears, nose, mouth, throat, and face: Denies mucositis or sore throat Respiratory: Denies cough, dyspnea or wheezes Cardiovascular: Denies palpitation, chest discomfort or lower extremity swelling Gastrointestinal:  Denies nausea or heartburn, loose stools occasionally bloody, rectal pain Skin: Denies abnormal skin rashes Lymphatics: Denies new lymphadenopathy or easy bruising Neurological:Denies numbness, tingling or new weaknesses Behavioral/Psych: Mood is stable, no new changes  All other systems were reviewed with the patient and are negative.  PHYSICAL EXAMINATION: ECOG PERFORMANCE STATUS: 1 - Symptomatic but completely ambulatory  Filed Vitals:   09/04/15 0955  BP: 126/56  Pulse: 58  Temp: 97.7 F (36.5 C)  Resp: 18   Filed Weights   09/04/15 0955  Weight: 182 lb 3.2 oz (82.645 kg)  GENERAL:alert, no distress and comfortable SKIN: skin color, texture, turgor are normal, no rashes or significant lesions EYES: normal, conjunctiva are pink and non-injected, sclera clear OROPHARYNX:no exudate, no erythema and lips, buccal mucosa, and tongue normal  NECK: supple, thyroid normal size, non-tender, without nodularity LYMPH:  no palpable lymphadenopathy in the cervical, axillary or inguinal LUNGS: clear to auscultation and percussion with normal breathing effort HEART: regular rate & rhythm and no murmurs and no lower extremity edema ABDOMEN:abdomen soft, non-tender and normal bowel sounds.  Musculoskeletal:no cyanosis of digits and no clubbing  PSYCH: alert & oriented x 3 with fluent speech NEURO: no focal motor/sensory deficits  LABORATORY DATA:  I have reviewed the data as listed CBC Latest Ref Rng 09/04/2015 08/28/2015 08/19/2015  WBC 4.0 - 10.3 10e3/uL 4.0 3.6(L) 4.4  Hemoglobin 13.0 - 17.1 g/dL 12.0(L) 13.1 13.3  Hematocrit 38.4 - 49.9 % 36.5(L) 40.8 40.6  Platelets 140 - 400 10e3/uL 163 174 205    Recent Labs  08/19/15 1005 08/28/15 0958  09/04/15 0942  NA 138 138 138  K 4.0 4.1 4.2  CO2 24 26 24   GLUCOSE 94 93 95  BUN 15.5 13.5 14.4  CREATININE 0.8 0.8 0.8  CALCIUM 8.8 9.0 8.7  PROT 6.3* 6.3* 6.0*  ALBUMIN 3.4* 3.5 3.3*  AST 20 24 22   ALT 17 19 20   ALKPHOS 73 81 81  BILITOT 0.75 0.80 0.68    PATHOLOGY REPORT  Diagnosis 07/16/2015  Rectum, biopsy, mass - INVASIVE ADENOCARCINOMA WITH SIGNET RING CELL FEATURES. - LYMPHOVASCULAR INVASION IS IDENTIFIED. - SEE COMMENT. Microscopic Comment To help evaluate the case, immunohistochemistry stains were performed. The malignant cells are positive for CDX2 and cytokeratin AE1/AE3. There is focal staining for CD56, a neuroendocrine marker. Prostein and S-100 stains are negative. Overall, the features are consistent with invasive adenocarcinoma of colonic origin, with signet ring cell features. The case was discussed with Dr. Cristina Gong on 07/18/15. (JBK:ds 07/18/15)   RADIOGRAPHIC STUDIES: I have personally reviewed the radiological images as listed and agreed with the findings in the report. Nm Pet Image Initial (pi) Skull Base To Thigh  08/06/2015  CLINICAL DATA:  Initial treatment strategy for new diagnosis of rectal cancer. Staging. History of melanoma and remote history of ulcerative colitis. EXAM: NUCLEAR MEDICINE PET SKULL BASE TO THIGH TECHNIQUE: 11 point to mCi F-18 FDG was injected intravenously. Full-ring PET imaging was performed from the skull base to thigh after the radiotracer. CT data was obtained and used for attenuation correction and anatomic localization. FASTING BLOOD GLUCOSE:  Value: 92 mg/dl COMPARISON:  Clinic note of 07/23/2015. FINDINGS: NECK No areas of abnormal hypermetabolism. CHEST Focal hypermetabolism at the distal esophagus is without CT correlate and favored to be physiologic. This measures a S.U.V. max of 5.8, including on image 95/series 4. ABDOMEN/PELVIS Multifocal colonic hypermetabolism is primarily felt to be physiologic. Anal rectal wall  thickening and hypermetabolism, consistent with the clinical history of primary malignancy. This measures a S.U.V. max of 10.9, including on image 183/series 4. No hypermetabolic pelvic sidewall nodes are seen. There is transmural extension and/or a a perirectal node at the 11 o'clock position on image 183/ series 4. This measures 8 mm. Although no high-grade obstruction is seen, there is a large volume of proximal stool. A separate area of more cephalad rectosigmoid junction underdistention and hypermetabolism is identified. This measures a S.U.V. max of 12.7, including on image 166/series 4. SKELETON Osteoarthritis of the left hip is advanced with concurrent hypermetabolism. CT IMAGES PERFORMED  FOR ATTENUATION CORRECTION No cervical adenopathy. Moderate cardiomegaly with multivessel coronary artery atherosclerosis. Pulmonary artery enlargement, outflow tract 3.7 cm. No small bowel obstruction. Mild ectasia of the right common iliac artery. Fat containing right inguinal hernia. Moderate prostatomegaly. Multiple (greater than 10) bladder stones on the order of 8 mm. IMPRESSION: 1. Anorectal primary. Transmural spread and/or perirectal nodal disease, without distant hypermetabolic metastasis. 2. Large colonic stool burden more proximally may represent a component of partial obstruction. No small bowel distention to suggest proximal propagation. 3. Separate area of rectosigmoid junction underdistention and hypermetabolism. Correlate with results of prior colonoscopy. If the prior exam was not able to traverse the primary anal rectal lesion, a synchronous carcinoma cannot be entirely excluded. 4. Prostatomegaly with multiple bladder stones, suggesting a component of bladder outlet obstruction. 5. Cardiomegaly with coronary artery atherosclerosis and pulmonary artery enlargement. The latter finding suggests pulmonary arterial hypertension. Electronically Signed   By: Abigail Miyamoto M.D.   On: 08/06/2015 16:50      Flexible  Sigmoidoscopy 07/16/2015 by Dr. Cristina Gong  impression , an infiltrative no obstructing large mass was found in the distal rectum. The mass was partially circumferential (involving one third of the lumen circumference ). The mass measured 5 cm in length.  Biopsy was obtained.  LOWER ENDOSCOPY [place] 08/08/2015  STAGING: T3 N1 Mx by endorectal ultrasound (limited study). ENDOSCOPIC IMPRESSION: Poor prep; unable to do colonoscopy. Rectal mass (adenocarcinoma on biopsies). Staging as above.    ASSESSMENT & PLAN:  81 year old Caucasian male, with past medical history of ulcerative colitis, hypertension and hypothyroidism, who lives in a independent living, presented with bowel habits change in the mild rectal bleeding.  1. Low rectal adenocarcinoma, cT3N1M0 -I previously reviewed his sigmoidoscopy findings and the biopsy results in great detail with him. -I reviewed his PET CT scan result with him in details. He appears to have regional lymphadenopathy, but no evidence of distant metastasis. -lower endoscopy US showed a T3N1 tumor, unable to do colonoscopy due to poor prep. -He was seen by colorectal surgeon Dr. Marcello Moores, surgical resection would be considered if he tolerates neoadjuvant chemotherapy and radiation. -Due to his advanced age and some comorbidities, I'll consider low-dose chemotherapy capecitabine 1500mg  (725mg /m2, instead of standard 825mg /m2) with concurrent radiation.  -He has tolerated concurrent chemoradiation well overall, with the main complaint of back pain when he lays on the table, which requires narcotics.  -Lab reviewed with him, mild anemia, ANC normal, CMP stable. He will continue chemoradiation. -Dr. Marcello Moores will see him after he completes chemotherapy and radiation, and order a pelvic MRI after CCRT   2. Low back pain, secondary to spinal stenosis and rectal pain -controlled by vicodin, he takes 2 tablets 1 hour before his radiation.  3. Rectal  irritation -due to radiation and frequent stools applying sonafine gel BID to area as advised by radiation oncology  4. HTN, AF, arthritis -He will continue follow-up with his primary care physician -will watch his blood pressure and heart rate to closely during the concurrent chemoradiation.  Plan -Continue concurrent chemoradiation, continue 2 Vicodin tablets 1 hour before radiation and sonafine gel -Lab weekly -We'll see him every week until he completes chemoradiation   All questions were answered. The patient knows to call the clinic with any problems, questions or concerns. I spent 15 minutes counseling the patient face to face. The total time spent in the appointment was 20 minutes and more than 50% was on counseling.     Laurie Panda, NP 09/04/2015  10:50 AM

## 2015-09-05 ENCOUNTER — Ambulatory Visit
Admission: RE | Admit: 2015-09-05 | Discharge: 2015-09-05 | Disposition: A | Discharge status: Home / Self Care | Payer: Medicare Other | Source: Ambulatory Visit | Attending: Radiation Oncology | Admitting: Radiation Oncology

## 2015-09-05 DIAGNOSIS — Z510 Encounter for antineoplastic radiation therapy: Secondary | ICD-10-CM | POA: Diagnosis not present

## 2015-09-05 DIAGNOSIS — C20 Malignant neoplasm of rectum: Secondary | ICD-10-CM | POA: Diagnosis not present

## 2015-09-06 ENCOUNTER — Ambulatory Visit
Admission: RE | Admit: 2015-09-06 | Discharge: 2015-09-06 | Disposition: A | Discharge status: Home / Self Care | Payer: Medicare Other | Source: Ambulatory Visit | Attending: Radiation Oncology | Admitting: Radiation Oncology

## 2015-09-06 ENCOUNTER — Encounter: Payer: Self-pay | Admitting: Radiation Oncology

## 2015-09-06 VITALS — BP 128/61 | HR 58 | Temp 97.5°F | Ht 72.0 in | Wt 183.2 lb

## 2015-09-06 DIAGNOSIS — C20 Malignant neoplasm of rectum: Secondary | ICD-10-CM | POA: Diagnosis not present

## 2015-09-06 DIAGNOSIS — Z510 Encounter for antineoplastic radiation therapy: Secondary | ICD-10-CM | POA: Diagnosis not present

## 2015-09-06 NOTE — Progress Notes (Signed)
  Radiation Oncology         [phone]   Name: Nathaniel [name] MRN: [id number]   Date: 09/06/2015  DOB: 21-Mar-1920     Weekly Radiation Therapy Management    ICD-9-CM ICD-10-CM   1. Rectal cancer (H[facility]) 154.1 C20     Current Dose: 34.2 Gy  Planned Dose:  50.4 Gy  Narrative  Nathaniel [name] is here for his 19/28 fractions to his Rectum. He reports pain a 5/10 in his rectum, which he is taking 2 hydrocodone only before each radiation treatment. He does not take hydrocodone any other time. He reports some redness at his rectum which he is applying sonafine cream twice daily. He does report a small amount of blood after he wipes himself when he has used the bathroom, but reports this has been happening since his diagnosis. He does state it is slightly worse since starting radiation. His bowels are irregular at times, at times he will not have a bowel movement for several days. He does have miralax at home, which he uses if he has not had a bowel movement in three days. He reports normal urination, and denies any nausea or vomiting. He reports a decreased energy level, and does "just what he has to do" at home. He has no other concerns at this time.  Set-up films were reviewed. The chart was checked.  Physical Findings  height is 6' (1.829 m) and weight is 183 lb 3.2 oz (83.099 kg). His temperature is 97.5 F (36.4 C). His blood pressure is 128/61 and his pulse is 58. . Weight essentially stable.  No significant changes.  Impression The patient is doing satisfactorily with treatment.  Plan Continue treatment as planned.         Sheral Apley Tammi Klippel, M.D.  This document serves as a record of services personally performed by Tyler Pita, MD. It was created on his behalf by Lendon Collar, a trained medical scribe. The creation of this record is based on the scribe's personal observations and the provider's statements to them. This document has been checked and approved by the attending  provider.

## 2015-09-06 NOTE — Progress Notes (Signed)
Nathaniel [name] is here for his 19/28 fractions to his Rectum. He reports pain a 5/10 in his rectum, which he is taking 2 hydrocodone only before each radiation treatment. He does not take hydrocodone any other time. He reports some redness at his rectum which he is applying sonafine cream twice daily. He does report a small amount of blood after he wipes himself when he has used the bathroom, but reports this has been happening since his diagnosis. He does state it is slightly worse since starting radiation. His bowels are irregular at times, at times he will not have a bowel movement for several days. He does have miralax at home, which he uses if he has not had a bowel movement in three days. He reports normal urination, and denies any nausea or vomiting. He reports a decreased energy level, and does "just what he has to do" at home. He has no other concerns at this time.   BP 128/61 mmHg  Pulse 58  Temp(Src) 97.5 F (36.4 C)  Ht 6' (1.829 m)  Wt 183 lb 3.2 oz (83.099 kg)  BMI 24.84 kg/m2   Wt Readings from Last 3 Encounters:  09/06/15 183 lb 3.2 oz (83.099 kg)  09/04/15 182 lb 3.2 oz (82.645 kg)  08/30/15 181 lb 14.4 oz (82.509 kg)

## 2015-09-10 ENCOUNTER — Ambulatory Visit
Admission: RE | Admit: 2015-09-10 | Discharge: 2015-09-10 | Disposition: A | Discharge status: Home / Self Care | Payer: Medicare Other | Source: Ambulatory Visit | Attending: Radiation Oncology | Admitting: Radiation Oncology

## 2015-09-10 DIAGNOSIS — F1010 Alcohol abuse, uncomplicated: Secondary | ICD-10-CM | POA: Diagnosis not present

## 2015-09-10 DIAGNOSIS — E079 Disorder of thyroid, unspecified: Secondary | ICD-10-CM | POA: Diagnosis not present

## 2015-09-10 DIAGNOSIS — Z7982 Long term (current) use of aspirin: Secondary | ICD-10-CM | POA: Diagnosis not present

## 2015-09-10 DIAGNOSIS — E7800 Pure hypercholesterolemia, unspecified: Secondary | ICD-10-CM | POA: Diagnosis not present

## 2015-09-10 DIAGNOSIS — E039 Hypothyroidism, unspecified: Secondary | ICD-10-CM | POA: Diagnosis not present

## 2015-09-10 DIAGNOSIS — Z510 Encounter for antineoplastic radiation therapy: Secondary | ICD-10-CM | POA: Diagnosis present

## 2015-09-10 DIAGNOSIS — C20 Malignant neoplasm of rectum: Secondary | ICD-10-CM | POA: Diagnosis present

## 2015-09-10 DIAGNOSIS — I10 Essential (primary) hypertension: Secondary | ICD-10-CM | POA: Diagnosis not present

## 2015-09-11 ENCOUNTER — Other Ambulatory Visit: Payer: BLUE CROSS/BLUE SHIELD

## 2015-09-11 ENCOUNTER — Ambulatory Visit (HOSPITAL_BASED_OUTPATIENT_CLINIC_OR_DEPARTMENT_OTHER): Payer: Medicare Other | Admitting: Hematology

## 2015-09-11 ENCOUNTER — Ambulatory Visit
Admission: RE | Admit: 2015-09-11 | Discharge: 2015-09-11 | Disposition: A | Discharge status: Home / Self Care | Payer: Medicare Other | Source: Ambulatory Visit | Attending: Radiation Oncology | Admitting: Radiation Oncology

## 2015-09-11 ENCOUNTER — Encounter: Payer: Self-pay | Admitting: Hematology

## 2015-09-11 ENCOUNTER — Encounter: Payer: Self-pay | Admitting: *Deleted

## 2015-09-11 ENCOUNTER — Other Ambulatory Visit (HOSPITAL_BASED_OUTPATIENT_CLINIC_OR_DEPARTMENT_OTHER): Payer: Medicare Other

## 2015-09-11 VITALS — BP 119/59 | HR 59 | Temp 97.9°F | Resp 17 | Wt 184.6 lb

## 2015-09-11 DIAGNOSIS — C20 Malignant neoplasm of rectum: Secondary | ICD-10-CM

## 2015-09-11 DIAGNOSIS — M545 Low back pain: Secondary | ICD-10-CM | POA: Diagnosis not present

## 2015-09-11 DIAGNOSIS — Z510 Encounter for antineoplastic radiation therapy: Secondary | ICD-10-CM | POA: Diagnosis not present

## 2015-09-11 DIAGNOSIS — D6481 Anemia due to antineoplastic chemotherapy: Secondary | ICD-10-CM

## 2015-09-11 DIAGNOSIS — K6289 Other specified diseases of anus and rectum: Secondary | ICD-10-CM

## 2015-09-11 LAB — CBC WITH DIFFERENTIAL/PLATELET
BASO%: 0.7 % (ref 0.0–2.0)
Basophils Absolute: 0 10*3/uL (ref 0.0–0.1)
EOS ABS: 0.1 10*3/uL (ref 0.0–0.5)
EOS%: 3.5 % (ref 0.0–7.0)
HCT: 35 % — ABNORMAL LOW (ref 38.4–49.9)
HEMOGLOBIN: 11.6 g/dL — AB (ref 13.0–17.1)
LYMPH%: 5.7 % — AB (ref 14.0–49.0)
MCH: 32.1 pg (ref 27.2–33.4)
MCHC: 33 g/dL (ref 32.0–36.0)
MCV: 97.4 fL (ref 79.3–98.0)
MONO#: 0.7 10*3/uL (ref 0.1–0.9)
MONO%: 16.6 % — AB (ref 0.0–14.0)
NEUT%: 73.5 % (ref 39.0–75.0)
NEUTROS ABS: 3 10*3/uL (ref 1.5–6.5)
Platelets: 191 10*3/uL (ref 140–400)
RBC: 3.6 10*6/uL — AB (ref 4.20–5.82)
RDW: 16.5 % — AB (ref 11.0–14.6)
WBC: 4.1 10*3/uL (ref 4.0–10.3)
lymph#: 0.2 10*3/uL — ABNORMAL LOW (ref 0.9–3.3)

## 2015-09-11 LAB — COMPREHENSIVE METABOLIC PANEL
ALT: 18 U/L (ref 0–55)
AST: 22 U/L (ref 5–34)
Albumin: 3.1 g/dL — ABNORMAL LOW (ref 3.5–5.0)
Alkaline Phosphatase: 81 U/L (ref 40–150)
Anion Gap: 9 mEq/L (ref 3–11)
BILIRUBIN TOTAL: 0.59 mg/dL (ref 0.20–1.20)
BUN: 15.6 mg/dL (ref 7.0–26.0)
CHLORIDE: 105 meq/L (ref 98–109)
CO2: 24 meq/L (ref 22–29)
CREATININE: 0.8 mg/dL (ref 0.7–1.3)
Calcium: 8.6 mg/dL (ref 8.4–10.4)
EGFR: 77 mL/min/{1.73_m2} — ABNORMAL LOW (ref >90)
GLUCOSE: 98 mg/dL (ref 70–140)
Potassium: 4.2 mEq/L (ref 3.5–5.1)
SODIUM: 137 meq/L (ref 136–145)
TOTAL PROTEIN: 5.8 g/dL — AB (ref 6.4–8.3)

## 2015-09-11 LAB — CEA: CEA: 2.1 ng/mL (ref 0.0–5.0)

## 2015-09-11 MED ORDER — SONAFINE EX EMUL
1.0000 "application " | Freq: Two times a day (BID) | CUTANEOUS | Status: DC
  Administered 2015-09-11: 1 via TOPICAL
  Filled 2015-09-11: qty 45

## 2015-09-11 MED FILL — XELODA 500 MG TABLET: 500 | 7 days supply | Qty: 39 | Fill #2

## 2015-09-11 NOTE — Progress Notes (Signed)
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 male is here because of recently diagnosed rectal cancer. He presents to the clinic by himself.  He has had abnormal bowel movement for the past 2-3 months. It's usually very small amount, loose, every time he urinates, he has a such a small bowel movement. He does not really have a good normal bowel movement. He also reports frequent stool stains on his underwear. He noticed small amount blood in his stool recently, which prompt his seeing GI Dr. Cristina Gong on. He underwent a flexible sigmoidoscopy in the office, which showed a rectal mass, biopsy reviewed adenocarcinoma. He denies significant rectal pain, abdominal discomfort, or other new complaints.  He lives in a independent living facility, he is able to take care of his all ADLs, and does some light housework, shopping, by himself. He uses a walker, still drives. He does get fatigued after activity, his energy level has seemed to be decreasing in over the past few years. No significant change daily. He has decent appetite and eats well.  He had remote history of  ulcerative colitis, which has been in remission. 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  He is widowed, has 5 children, who all live in out states. He does have several stepchildren from his secondary to, and some of them live in [place].  CURRENT TREATMENT: concurrent chemotherapy and radiation with Capecitabine 1500 mg (725mg /m2) twice daily, started on 08/12/2015  INTERIM HISTORY Mr. Fross returns for follow-up. He is on week 4 concurrent chemoradiation. He has been tolerating therapy well. His back pain and the rectal bleeding has improved lately, and his stool is more formed. He still has loose bowel movement 4-6 times daily, he does not use Imodium. No fever or chills, no other new complaints. His energy level and appetite has remained to be decent. He functions well at home.  MEDICAL HISTORY:  Past Medical History  Diagnosis Date  . Thyroid disease   . Hypercholesteremia   . HTN (hypertension)   . Hypothyroidism   . Cancer ([place]) 07/16/15    Rectal Cancer=adenocarcinoma  . Allergy   . Atrial fibrillation ([place])   . Pericarditis   . 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 Kidney stone   . Arthritis     SURGICAL HISTORY: Past Surgical History  Procedure Laterality Date  . Shoulder surgery Right 2013    rotator cuff   . Knee surgery Left 73 years ago  . Ankle fracture surgery Left 79 years ago  . Hernia  repair  28 years ago  . Mohs surgery    . Eye surgery    . Eus N/A 08/08/2015    Procedure: LOWER ENDOSCOPIC ULTRASOUND (EUS);  Surgeon: Arta Silence, MD;  Location[facility] ENDOSCOPY;  Service: Endoscopy;  Laterality: N/A;  . Flexible sigmoidoscopy N/A 08/08/2015    Procedure: FLEXIBLE SIGMOIDOSCOPY;  Surgeon: Arta Silence, MD;  Location: [facility] ENDOSCOPY;  Service: Endoscopy;  Laterality: N/A;  poor prep    SOCIAL HISTORY: Social History   Social History  . Marital Status: Widowed      Spouse Name: N/A  . Number of Children: 4 daughters and one son, and 5 stepchildren   . Years of Education: N/A   Occupational History  . 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 Smoking status: Never Smoker   . Smokeless tobacco: Not on file  . Alcohol Use: 0.6 oz/week    1 Glasses of wine per week     Comment: 1 glass of wine  . Drug Use: No  . Sexual Activity: Not on file   Other Topics Concern  . Not on file   Social History Narrative    FAMILY HISTORY: Family History  Problem Relation Age of Onset  . Stroke Father   . Cancer Mother     spine ,died of parkinson's    ALLERGIES:  is allergic to cinnamon; ibuprofen; percocet; protonix; and xarelto.  MEDICATIONS:  Current Outpatient Prescriptions  Medication Sig Dispense Refill  . aspirin 325 MG tablet Take 325 mg by mouth daily.     . finasteride (PROSCAR) 5 MG tablet Take 5 mg by mouth daily. Dose is taken at bedtime    . HYDROcodone-acetaminophen (NORCO/VICODIN) 5-325 MG tablet Take 1 tablet by mouth every 6 (six) hours as needed for moderate pain. Take 30 min to 1 hour before MRI 30 tablet 0  . levothyroxine (SYNTHROID, LEVOTHROID) 88 MCG tablet Take 88 mcg by mouth daily before breakfast.     . metoprolol succinate (TOPROL-XL) 25 MG 24 hr tablet Take 0.5 tablets (12.5 mg total) by mouth daily. 45 tablet 2  . Wound Dressings (SONAFINE) Apply 1 application topically 2 (two) times daily.    . XELODA 500 MG tablet Take 1,500 mg by mouth 2 (two) times daily.   2   No current facility-administered medications for this visit.   Facility-Administered Medications Ordered in Other Visits  Medication Dose Route Frequency Provider Last Rate Last Dose  . SONAFINE emulsion 1 application  1 application Topical BID Kyung Rudd, MD   1 application at [id number] [phone]    REVIEW OF SYSTEMS:   Constitutional: Denies fevers, chills or abnormal night sweats Eyes: Denies blurriness of vision, double vision  or watery eyes Ears, nose, mouth, throat, and face: Denies mucositis or sore throat Respiratory: Denies cough, dyspnea or wheezes Cardiovascular: Denies palpitation, chest discomfort or lower extremity swelling Gastrointestinal:  Denies nausea, heartburn or change in bowel habits Skin: Denies abnormal skin rashes Lymphatics: Denies new lymphadenopathy or easy bruising Neurological:Denies numbness, tingling or new weaknesses Behavioral/Psych: Mood is stable, no new changes  All other systems were reviewed with the patient and are negative.  PHYSICAL EXAMINATION: ECOG PERFORMANCE STATUS: 1 - Symptomatic but completely ambulatory  Filed Vitals:   09/11/15 1007  BP: 119/59  Pulse: 59  Temp: 97.9 F (36.6 C)  Resp: 17   Filed Weights   09/11/15 1007  Weight: 184 lb 9.6 oz (83.734 kg)  GENERAL:alert, no distress and comfortable SKIN: skin color, texture, turgor are normal, no rashes or significant lesions EYES: normal, conjunctiva are pink and non-injected, sclera clear OROPHARYNX:no exudate, no erythema and lips, buccal mucosa, and tongue normal  NECK: supple, thyroid normal size, non-tender, without nodularity LYMPH:  no palpable lymphadenopathy in the cervical, axillary or inguinal LUNGS: clear to auscultation and percussion with normal breathing effort HEART: regular rate & rhythm and no murmurs and no lower extremity edema ABDOMEN:abdomen soft, non-tender and normal bowel sounds. Rectal exam revealed an anterior low rectal mass, non-tender, no blood on glove.  Musculoskeletal:no cyanosis of digits and no clubbing  PSYCH: alert & oriented x 3 with fluent speech NEURO: no focal motor/sensory deficits  LABORATORY DATA:  I have reviewed the data as listed CBC Latest Ref Rng 09/11/2015 09/04/2015 08/28/2015  WBC 4.0 - 10.3 10e3/uL 4.1 4.0 3.6(L)  Hemoglobin 13.0 - 17.1 g/dL 11.6(L) 12.0(L) 13.1  Hematocrit 38.4 - 49.9 % 35.0(L) 36.5(L) 40.8  Platelets 140 - 400 10e3/uL 191 163  174    Recent Labs  08/28/15 0958 09/04/15 0942 09/11/15 0904  NA 138 138 137  K 4.1 4.2 4.2  CO2 26 24 24   GLUCOSE 93 95 98  BUN 13.5 14.4 15.6  CREATININE 0.8 0.8 0.8  CALCIUM 9.0 8.7 8.6  PROT 6.3* 6.0* 5.8*  ALBUMIN 3.5 3.3* 3.1*  AST 24 22 22   ALT 19 20 18   ALKPHOS 81 81 81  BILITOT 0.80 0.68 0.59    PATHOLOGY REPORT  Diagnosis 07/16/2015  Rectum, biopsy, mass - INVASIVE ADENOCARCINOMA WITH SIGNET RING CELL FEATURES. - LYMPHOVASCULAR INVASION IS IDENTIFIED. - SEE COMMENT. Microscopic Comment To help evaluate the case, immunohistochemistry stains were performed. The malignant cells are positive for CDX2 and cytokeratin AE1/AE3. There is focal staining for CD56, a neuroendocrine marker. Prostein and S-100 stains are negative. Overall, the features are consistent with invasive adenocarcinoma of colonic origin, with signet ring cell features. The case was discussed with Dr. Cristina Gong on 07/18/15. (JBK:ds 07/18/15)   RADIOGRAPHIC STUDIES: I have personally reviewed the radiological images as listed and agreed with the findings in the report. No results found.   Flexible  Sigmoidoscopy 07/16/2015 by Dr. Cristina Gong  impression , an infiltrative no obstructing large mass was found in the distal rectum. The mass was partially circumferential (involving one third of the lumen circumference ). The mass measured 5 cm in length.  Biopsy was obtained.  LOWER ENDOSCOPY [place] 08/08/2015  STAGING: T3 N1 Mx by endorectal ultrasound (limited study). ENDOSCOPIC IMPRESSION: Poor prep; unable to do colonoscopy. Rectal mass (adenocarcinoma on biopsies). Staging as above.    ASSESSMENT & PLAN:  82 year old Caucasian male, with past medical history of ulcerative colitis, hypertension and hypothyroidism, who lives in a independent living, presented with bowel habits change in the mild rectal bleeding.  1. Low rectal adenocarcinoma, cT3N1M0, stage IIIB  -I previously reviewed his  sigmoidoscopy findings and the biopsy results in great detail with him. -I reviewed his PET CT scan result with him in details. He appears to have regional lymphadenopathy, but no evidence of distant metastasis. -lower endoscopy US showed a T3N1 tumor, unable to do colonoscopy due to poor prep. -He was seen by colorectal surgeon Dr. Marcello Moores, surgical resection would be considered if he tolerates neoadjuvant chemotherapy and radiation. -Due to his advanced age and some comorbidities, I'll consider low-dose chemotherapy capecitabine 1500mg  (725mg /m2, instead of standard 825mg /m2) with concurrent radiation.  -He has tolerated concurrent chemoradiation well overall, with the  main complaint of back pain when he lays on the table, which requires narcotics.  -Lab reviewed with him, mild anemia, CMP normal. He will continue chemoradiation. -Dr. Marcello Moores will see him after he completes chemotherapy and radiation, and order a pelvic MRI after CCRT   2. Low back pain -secondary to spinal stenosis and rectal pain, It has improved since he started the chemoradiation. -controlled by vicodin, he takes 2 tablets 1 hour before his radiation.  3. HTN, AF, arthritis -He will continue follow-up with his primary care physician -will watch his blood pressure and heart rate to closely during the concurrent chemoradiation.  4. Anemia -developed after chemoRT -mild, will monitor   Plan -Continue concurrent chemoradiation, take Vicodin 2 tablets 1 hour before radiation -I encouraged him to use Imodium for diarrhea -Lab weekly -We'll see him every 1-2 weeks until he completes chemoradiation   All questions were answered. The patient knows to call the clinic with any problems, questions or concerns. I spent 15 minutes counseling the patient face to face. The total time spent in the appointment was 20 minutes and more than 50% was on counseling.     [name], MD 09/11/2015 9:40 PM

## 2015-09-11 NOTE — Progress Notes (Signed)
Oncology Nurse Navigator Documentation  Oncology Nurse Navigator Flowsheets 09/11/2015  Navigator Location [facility]  Navigator Encounter Type Follow-up Appt  Patient Visit Type MedOnc  Treatment Phase Treatment: RT/Xeloda (7 treatments left)  Barriers/Navigation Needs Weakness, fecal incontinence  Education -  Interventions Other-inquired of MD regarding PT referral for weakness and fecal incontinence issues  Coordination of Care -  Education Method -  Support Groups/Services -  Time Spent with Patient 15  Decreased appetite and taste alterations. He has maintained his weight. Has approximately 5-6 stools today (look like mud).

## 2015-09-12 ENCOUNTER — Ambulatory Visit
Admission: RE | Admit: 2015-09-12 | Discharge: 2015-09-12 | Disposition: A | Discharge status: Home / Self Care | Payer: Medicare Other | Source: Ambulatory Visit | Attending: Radiation Oncology | Admitting: Radiation Oncology

## 2015-09-12 DIAGNOSIS — Z510 Encounter for antineoplastic radiation therapy: Secondary | ICD-10-CM | POA: Diagnosis not present

## 2015-09-12 DIAGNOSIS — C20 Malignant neoplasm of rectum: Secondary | ICD-10-CM | POA: Diagnosis not present

## 2015-09-13 ENCOUNTER — Encounter: Payer: Self-pay | Admitting: Radiation Oncology

## 2015-09-13 ENCOUNTER — Other Ambulatory Visit: Payer: Self-pay | Admitting: *Deleted

## 2015-09-13 ENCOUNTER — Telehealth: Payer: Self-pay | Admitting: *Deleted

## 2015-09-13 ENCOUNTER — Telehealth: Payer: Self-pay | Admitting: Pharmacist

## 2015-09-13 ENCOUNTER — Ambulatory Visit
Admission: RE | Admit: 2015-09-13 | Discharge: 2015-09-13 | Disposition: A | Discharge status: Home / Self Care | Payer: Medicare Other | Source: Ambulatory Visit | Attending: Radiation Oncology | Admitting: Radiation Oncology

## 2015-09-13 ENCOUNTER — Ambulatory Visit: Payer: BLUE CROSS/BLUE SHIELD | Admitting: Hematology

## 2015-09-13 ENCOUNTER — Other Ambulatory Visit: Payer: BLUE CROSS/BLUE SHIELD

## 2015-09-13 VITALS — BP 115/53 | HR 60 | Temp 97.6°F | Resp 20 | Wt 183.7 lb

## 2015-09-13 DIAGNOSIS — C20 Malignant neoplasm of rectum: Secondary | ICD-10-CM

## 2015-09-13 DIAGNOSIS — Z510 Encounter for antineoplastic radiation therapy: Secondary | ICD-10-CM | POA: Diagnosis not present

## 2015-09-13 NOTE — Telephone Encounter (Signed)
Oncology Nurse Navigator Documentation  Oncology Nurse Navigator Flowsheets 09/13/2015  Navigator Location [facility]  Navigator Encounter Type Telephone  Telephone Appt Confirmation/Clarification;Outgoing Call  Patient Visit Type MedOnc  Treatment Phase Treatment  Barriers/Navigation Needs -  Education -  Interventions Coordination of Care--Dr. Marcello Moores on 09/26/15 at 2:40 pm and he should be hearing from radiology regarding pelvic MRI  Coordination of Care Appts  Education Method -  Support Groups/Services -  Time Spent with Patient 15  Confirmed w/Dr. Marcello Moores that she needs pelvic MRI with/without contrast for rectal cancer staging (confirmed the contrast issue with MRI dept.). He will call navigator if he does not hear from radiology by 1/13.

## 2015-09-13 NOTE — Progress Notes (Addendum)
Weekly rad txs rectal  23/28 treatments completed, using sonafine cream daily and prn after bowel movements, says has ben 2 days no bm, goes from constipation to loose stols, but stated that has been like that for years, light irritation stated,  Uses baby wipes, hasn't used the sitz bath as yet, appetite decreased , taste buds bad, takes Xeloda bid, in w/c, enenrgy level very low stated,  9:59 AM BP 115/53 mmHg  Pulse 60  Temp(Src) 97.6 F (36.4 C) (Oral)  Resp 20  Wt 183 lb 11.2 oz (83.326 kg)  Wt Readings from Last 3 Encounters:  09/13/15 183 lb 11.2 oz (83.326 kg)  09/11/15 184 lb 9.6 oz (83.734 kg)  09/06/15 183 lb 3.2 oz (83.099 kg)

## 2015-09-13 NOTE — Progress Notes (Signed)
   Department of Radiation Oncology  Phone:  [phone] Fax:        [phone]  Weekly Treatment Note    Name: Nathaniel [name] Date: 09/13/2015 MRN: [id number] DOB: 08-Jun-1920   Diagnosis:     ICD-9-CM ICD-10-CM   1. Rectal cancer ([facility]) 154.1 C20      Current dose: 41.4 Gy  Current fraction: 23   MEDICATIONS: Current Outpatient Prescriptions  Medication Sig Dispense Refill  . aspirin 325 MG tablet Take 325 mg by mouth daily.     . finasteride (PROSCAR) 5 MG tablet Take 5 mg by mouth daily. Dose is taken at bedtime    . HYDROcodone-acetaminophen (NORCO/VICODIN) 5-325 MG tablet Take 1 tablet by mouth every 6 (six) hours as needed for moderate pain. Take 30 min to 1 hour before MRI 30 tablet 0  . levothyroxine (SYNTHROID, LEVOTHROID) 88 MCG tablet Take 88 mcg by mouth daily before breakfast.     . metoprolol succinate (TOPROL-XL) 25 MG 24 hr tablet Take 0.5 tablets (12.5 mg total) by mouth daily. 45 tablet 2  . Wound Dressings (SONAFINE) Apply 1 application topically 2 (two) times daily.    . XELODA 500 MG tablet Take 1,500 mg by mouth 2 (two) times daily.   2   No current facility-administered medications for this encounter.     ALLERGIES: Cinnamon; Ibuprofen; Percocet; Protonix; and Xarelto   LABORATORY DATA:  Lab Results  Component Value Date   WBC 4.1 09/11/2015   HGB 11.6* 09/11/2015   HCT 35.0* 09/11/2015   MCV 97.4 09/11/2015   PLT 191 09/11/2015   Lab Results  Component Value Date   NA 137 09/11/2015   K 4.2 09/11/2015   CL 102 03/15/2014   CO2 24 09/11/2015   Lab Results  Component Value Date   ALT 18 09/11/2015   AST 22 09/11/2015   ALKPHOS 81 09/11/2015   BILITOT 0.59 09/11/2015     NARRATIVE: Nathaniel [name] was seen today for weekly treatment management. The chart was checked and the patient's films were reviewed.  Weekly rad txs rectal  23/28 treatments completed, using sonafine cream daily and prn after bowel movements, says has  ben 2 days no bm, goes from constipation to loose stols, but stated that has been like that for years, light irritation stated,  Uses baby wipes, hasn't used the sitz bath as yet, appetite decreased , taste buds bad, takes Xeloda bid, in w/c, enenrgy level very low stated,  6:35 PM BP 115/53 mmHg  Pulse 60  Temp(Src) 97.6 F (36.4 C) (Oral)  Resp 20  Wt 183 lb 11.2 oz (83.326 kg)  Wt Readings from Last 3 Encounters:  09/13/15 183 lb 11.2 oz (83.326 kg)  09/11/15 184 lb 9.6 oz (83.734 kg)  09/06/15 183 lb 3.2 oz (83.099 kg)    PHYSICAL EXAMINATION: weight is 183 lb 11.2 oz (83.326 kg). His oral temperature is 97.6 F (36.4 C). His blood pressure is 115/53 and his pulse is 60. His respiration is 20.        ASSESSMENT: The patient is doing satisfactorily with treatment.  PLAN: We will continue with the patient's radiation treatment as planned.

## 2015-09-13 NOTE — Telephone Encounter (Signed)
09/13/15: Attempted to reach patient for follow up on oral medication: Xeloda. No answer. Left VM for patient to call back with any questions or issues.   Thank you,  Montel Clock, PharmD, [facility] [phone]

## 2015-09-16 ENCOUNTER — Ambulatory Visit
Admission: RE | Admit: 2015-09-16 | Discharge: 2015-09-16 | Disposition: A | Discharge status: Home / Self Care | Payer: Medicare Other | Source: Ambulatory Visit | Attending: Radiation Oncology | Admitting: Radiation Oncology

## 2015-09-16 DIAGNOSIS — Z510 Encounter for antineoplastic radiation therapy: Secondary | ICD-10-CM | POA: Diagnosis not present

## 2015-09-16 DIAGNOSIS — C20 Malignant neoplasm of rectum: Secondary | ICD-10-CM | POA: Diagnosis not present

## 2015-09-17 ENCOUNTER — Ambulatory Visit
Admission: RE | Admit: 2015-09-17 | Discharge: 2015-09-17 | Disposition: A | Discharge status: Home / Self Care | Payer: Medicare Other | Source: Ambulatory Visit | Attending: Radiation Oncology | Admitting: Radiation Oncology

## 2015-09-17 DIAGNOSIS — Z510 Encounter for antineoplastic radiation therapy: Secondary | ICD-10-CM | POA: Diagnosis not present

## 2015-09-17 DIAGNOSIS — C20 Malignant neoplasm of rectum: Secondary | ICD-10-CM | POA: Diagnosis not present

## 2015-09-18 ENCOUNTER — Ambulatory Visit
Admission: RE | Admit: 2015-09-18 | Discharge: 2015-09-18 | Disposition: A | Discharge status: Home / Self Care | Payer: Medicare Other | Source: Ambulatory Visit | Attending: Radiation Oncology | Admitting: Radiation Oncology

## 2015-09-18 ENCOUNTER — Other Ambulatory Visit (HOSPITAL_BASED_OUTPATIENT_CLINIC_OR_DEPARTMENT_OTHER): Payer: Medicare Other

## 2015-09-18 ENCOUNTER — Ambulatory Visit (HOSPITAL_BASED_OUTPATIENT_CLINIC_OR_DEPARTMENT_OTHER): Payer: Medicare Other | Admitting: Hematology

## 2015-09-18 ENCOUNTER — Encounter: Payer: Self-pay | Admitting: Hematology

## 2015-09-18 VITALS — BP 112/68 | HR 61 | Temp 97.8°F | Resp 18 | Wt 182.7 lb

## 2015-09-18 DIAGNOSIS — M545 Low back pain: Secondary | ICD-10-CM

## 2015-09-18 DIAGNOSIS — D6481 Anemia due to antineoplastic chemotherapy: Secondary | ICD-10-CM | POA: Diagnosis not present

## 2015-09-18 DIAGNOSIS — C20 Malignant neoplasm of rectum: Secondary | ICD-10-CM

## 2015-09-18 DIAGNOSIS — R238 Other skin changes: Secondary | ICD-10-CM | POA: Diagnosis not present

## 2015-09-18 DIAGNOSIS — T451X5A Adverse effect of antineoplastic and immunosuppressive drugs, initial encounter: Secondary | ICD-10-CM

## 2015-09-18 DIAGNOSIS — Z510 Encounter for antineoplastic radiation therapy: Secondary | ICD-10-CM | POA: Diagnosis not present

## 2015-09-18 LAB — COMPREHENSIVE METABOLIC PANEL
ALBUMIN: 3.2 g/dL — AB (ref 3.5–5.0)
ALK PHOS: 79 U/L (ref 40–150)
ALT: 20 U/L (ref 0–55)
ANION GAP: 7 meq/L (ref 3–11)
AST: 25 U/L (ref 5–34)
BUN: 16 mg/dL (ref 7.0–26.0)
CALCIUM: 8.7 mg/dL (ref 8.4–10.4)
CHLORIDE: 105 meq/L (ref 98–109)
CO2: 23 mEq/L (ref 22–29)
Creatinine: 0.8 mg/dL (ref 0.7–1.3)
EGFR: 76 mL/min/{1.73_m2} — AB (ref >90)
Glucose: 101 mg/dl (ref 70–140)
POTASSIUM: 4.4 meq/L (ref 3.5–5.1)
Sodium: 134 mEq/L — ABNORMAL LOW (ref 136–145)
Total Bilirubin: 0.58 mg/dL (ref 0.20–1.20)
Total Protein: 5.9 g/dL — ABNORMAL LOW (ref 6.4–8.3)

## 2015-09-18 LAB — CBC WITH DIFFERENTIAL/PLATELET
BASO%: 0.8 % (ref 0.0–2.0)
BASOS ABS: 0 10*3/uL (ref 0.0–0.1)
EOS ABS: 0.1 10*3/uL (ref 0.0–0.5)
EOS%: 2.1 % (ref 0.0–7.0)
HEMATOCRIT: 34.6 % — AB (ref 38.4–49.9)
HEMOGLOBIN: 11.4 g/dL — AB (ref 13.0–17.1)
LYMPH#: 0.2 10*3/uL — AB (ref 0.9–3.3)
LYMPH%: 5.3 % — ABNORMAL LOW (ref 14.0–49.0)
MCH: 32.4 pg (ref 27.2–33.4)
MCHC: 32.9 g/dL (ref 32.0–36.0)
MCV: 98.3 fL — AB (ref 79.3–98.0)
MONO#: 0.7 10*3/uL (ref 0.1–0.9)
MONO%: 19.1 % — ABNORMAL HIGH (ref 0.0–14.0)
NEUT#: 2.9 10*3/uL (ref 1.5–6.5)
NEUT%: 72.7 % (ref 39.0–75.0)
Platelets: 219 10*3/uL (ref 140–400)
RBC: 3.52 10*6/uL — ABNORMAL LOW (ref 4.20–5.82)
RDW: 17.4 % — ABNORMAL HIGH (ref 11.0–14.6)
WBC: 3.9 10*3/uL — ABNORMAL LOW (ref 4.0–10.3)

## 2015-09-18 NOTE — Progress Notes (Signed)
[place]  [phone]) [phone] Fax:([phone]) [phone]  Clinic follow up Note   Patient Care Team: [name], MD as PCP - General (Family Medicine) Ronald Lobo, MD as Consulting Physician (Gastroenterology) Tania Ade, RN as Registered Nurse 09/18/2015   CHIEF COMPLAINTS:  Follow up rectal cancer  Oncology History    Rectal cancer [facility])   Staging form: Colon and Rectum, AJCC 7th Edition     Clinical stage from 08/08/2015: Stage IIIB (T3, N1, M0) - Unsigned       Rectal cancer ([place])   07/16/2015 Pathology Results Biopsy positive for invasive adenocarcinoma with signet ring cell features   07/23/2015 Initial Diagnosis Rectal cancer ([place])   08/12/2015 Concurrent Chemotherapy Xeloda 1500mg  q12h on the day of radiation    08/12/2015 -  Radiation Therapy radiaiton to rectal cancer      HISTORY OF PRESENTING ILLNESS:  Nathaniel [name] 82 y.o. male is here because of recently diagnosed rectal cancer. He presents to the clinic by himself.  He has had abnormal bowel movement for the past 2-3 months. It's usually very small amount, loose, every time he urinates, he has a such a small bowel movement. He does not really have a good normal bowel movement. He also reports frequent stool stains on his underwear. He noticed small amount blood in his stool recently, which prompt his seeing GI Dr. Cristina Gong on. He underwent a flexible sigmoidoscopy in the office, which showed a rectal mass, biopsy reviewed adenocarcinoma. He denies significant rectal pain, abdominal discomfort, or other new complaints.  He lives in a independent living facility, he is able to take care of his all ADLs, and does some light housework, shopping, by himself. He uses a walker, still drives. He does get fatigued after activity, his energy level has seemed to be decreasing in over the past few years. No significant change daily. He has decent appetite and eats well.  He had remote history of  ulcerative colitis, which has been in remission. 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  He is widowed, has 5 children, who all live in out states. He does have several stepchildren from his secondary to, and some of them live in [place].  CURRENT TREATMENT: concurrent chemotherapy and radiation with Capecitabine 1500 mg (725mg /m2) twice daily, started on 08/12/2015  INTERIM HISTORY Nathaniel [name] returns for follow-up. He is on last week concurrent chemoradiation. He has been tolerating treatment well, his low back/rectal pain is much better, he stool is more formed, he still goes to bathroom every 3-6 hours, he uses Imodium as needed. He still has intermittent rectal bleeding, mainly on the toilet tissue, which has not changed since he started treatment. No other new complains.    MEDICAL HISTORY:  Past Medical History  Diagnosis Date  . Thyroid disease   . Hypercholesteremia   . HTN (hypertension)   . Hypothyroidism   . Cancer ([place]) 07/16/15    Rectal Cancer=adenocarcinoma  . Allergy   . Atrial fibrillation ([place])   . Pericarditis   . 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 Kidney stone   . Arthritis     SURGICAL HISTORY: Past Surgical History  Procedure Laterality Date  . Shoulder surgery Right 2013    rotator cuff   . Knee surgery Left 73 years ago  . Ankle fracture surgery Left 81 years ago  . Hernia repair  28  years ago  . Mohs surgery    . Eye surgery    . Eus N/A 08/08/2015    Procedure: LOWER ENDOSCOPIC ULTRASOUND (EUS);  Surgeon: Arta Silence, MD;  Location[facility] ENDOSCOPY;  Service: Endoscopy;  Laterality: N/A;  . Flexible sigmoidoscopy N/A 08/08/2015    Procedure: FLEXIBLE SIGMOIDOSCOPY;  Surgeon: Arta Silence, MD;  Location: [facility] ENDOSCOPY;  Service: Endoscopy;  Laterality: N/A;  poor prep    SOCIAL HISTORY: Social History   Social History  . Marital Status: Widowed      Spouse Name: N/A  . Number of Children: 4 daughters and one son, and 5 stepchildren   . Years of Education: N/A   Occupational History  . 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 Smoking status: Never Smoker   . Smokeless tobacco: Not on file  . Alcohol Use: 0.6 oz/week    1 Glasses of wine per week     Comment: 1 glass of wine  . Drug Use: No  . Sexual Activity: Not on file   Other Topics Concern  . Not on file   Social History Narrative    FAMILY HISTORY: Family History  Problem Relation Age of Onset  . Stroke Father   . Cancer Mother     spine ,died of parkinson's    ALLERGIES:  is allergic to cinnamon; ibuprofen; percocet; protonix; and xarelto.  MEDICATIONS:  Current Outpatient Prescriptions  Medication Sig Dispense Refill  . aspirin 325 MG tablet Take 325 mg by mouth daily.     . finasteride (PROSCAR) 5 MG tablet Take 5 mg by mouth daily. Dose is taken at bedtime    . HYDROcodone-acetaminophen (NORCO/VICODIN) 5-325 MG tablet Take 1 tablet by mouth every 6 (six) hours as needed for moderate pain. Take 30 min to 1 hour before MRI 30 tablet 0  . levothyroxine (SYNTHROID, LEVOTHROID) 88 MCG tablet Take 88 mcg by mouth daily before breakfast.     . metoprolol succinate (TOPROL-XL) 25 MG 24 hr tablet Take 0.5 tablets (12.5 mg total) by mouth daily. 45 tablet 2  . Wound Dressings (SONAFINE) Apply 1 application topically 2 (two) times daily.    . XELODA 500 MG tablet Take 1,500 mg by mouth 2 (two) times daily.   2   No current facility-administered medications for this visit.    REVIEW OF SYSTEMS:   Constitutional: Denies fevers, chills or abnormal night sweats Eyes: Denies blurriness of vision, double vision or watery eyes Ears, nose, mouth, throat, and face: Denies mucositis or sore throat Respiratory: Denies cough, dyspnea or wheezes Cardiovascular: Denies palpitation, chest discomfort or lower extremity swelling Gastrointestinal:   Denies nausea, heartburn or change in bowel habits Skin: Denies abnormal skin rashes Lymphatics: Denies new lymphadenopathy or easy bruising Neurological:Denies numbness, tingling or new weaknesses Behavioral/Psych: Mood is stable, no new changes  All other systems were reviewed with the patient and are negative.  PHYSICAL EXAMINATION: ECOG PERFORMANCE STATUS: 2  Filed Vitals:   09/18/15 1043  BP: 112/68  Pulse: 61  Temp: 97.8 F (36.6 C)  Resp: 18   Filed Weights   09/18/15 1043  Weight: 182 lb 11.2 oz (82.872 kg)    GENERAL:alert, no distress and comfortable SKIN: skin color, texture, turgor are normal, no rashes or significant lesions except dry skin on hands, and a few skin cracks on the tip of some fingers. EYES: normal, conjunctiva are pink and non-injected, sclera clear OROPHARYNX:no exudate, no  erythema and lips, buccal mucosa, and tongue normal  NECK: supple, thyroid normal size, non-tender, without nodularity LYMPH:  no palpable lymphadenopathy in the cervical, axillary or inguinal LUNGS: clear to auscultation and percussion with normal breathing effort HEART: regular rate & rhythm and no murmurs and no lower extremity edema ABDOMEN:abdomen soft, non-tender and normal bowel sounds. (+) Diffuse skin erythema at of the perirectal area, with mild shallow skin cracks and ulcers above the tailbone. I do not to perform rectal exam. PSYCH: alert & oriented x 3 with fluent speech NEURO: no focal motor/sensory deficits  LABORATORY DATA:  I have reviewed the data as listed CBC Latest Ref Rng 09/18/2015 09/11/2015 09/04/2015  WBC 4.0 - 10.3 10e3/uL 3.9(L) 4.1 4.0  Hemoglobin 13.0 - 17.1 g/dL 11.4(L) 11.6(L) 12.0(L)  Hematocrit 38.4 - 49.9 % 34.6(L) 35.0(L) 36.5(L)  Platelets 140 - 400 10e3/uL 219 191 163    Recent Labs  09/04/15 0942 09/11/15 0904 09/18/15 1003  NA 138 137 134*  K 4.2 4.2 4.4  CO2 24 24 23   GLUCOSE 95 98 101  BUN 14.4 15.6 16.0  CREATININE 0.8 0.8  0.8  CALCIUM 8.7 8.6 8.7  PROT 6.0* 5.8* 5.9*  ALBUMIN 3.3* 3.1* 3.2*  AST 22 22 25   ALT 20 18 20   ALKPHOS 81 81 79  BILITOT 0.68 0.59 0.58    PATHOLOGY REPORT  Diagnosis 07/16/2015  Rectum, biopsy, mass - INVASIVE ADENOCARCINOMA WITH SIGNET RING CELL FEATURES. - LYMPHOVASCULAR INVASION IS IDENTIFIED. - SEE COMMENT. Microscopic Comment To help evaluate the case, immunohistochemistry stains were performed. The malignant cells are positive for CDX2 and cytokeratin AE1/AE3. There is focal staining for CD56, a neuroendocrine marker. Prostein and S-100 stains are negative. Overall, the features are consistent with invasive adenocarcinoma of colonic origin, with signet ring cell features. The case was discussed with Dr. Cristina Gong on 07/18/15. (JBK:ds 07/18/15)   RADIOGRAPHIC STUDIES: I have personally reviewed the radiological images as listed and agreed with the findings in the report. No results found.   Flexible  Sigmoidoscopy 07/16/2015 by Dr. Cristina Gong  impression , an infiltrative no obstructing large mass was found in the distal rectum. The mass was partially circumferential (involving one third of the lumen circumference ). The mass measured 5 cm in length.  Biopsy was obtained.  LOWER ENDOSCOPY [place] 08/08/2015  STAGING: T3 N1 Mx by endorectal ultrasound (limited study). ENDOSCOPIC IMPRESSION: Poor prep; unable to do colonoscopy. Rectal mass (adenocarcinoma on biopsies). Staging as above.    ASSESSMENT & PLAN:  82 year old Caucasian male, with past medical history of ulcerative colitis, hypertension and hypothyroidism, who lives in a independent living, presented with bowel habits change in the mild rectal bleeding.  1. Low rectal adenocarcinoma, cT3N1M0, stage IIIB  -I previously reviewed his sigmoidoscopy findings and the biopsy results in great detail with him. -I reviewed his PET CT scan result with him in details. He appears to have regional lymphadenopathy, but no evidence  of distant metastasis. -lower endoscopy US showed a T3N1 tumor, unable to do colonoscopy due to poor prep. -He was seen by colorectal surgeon Dr. Marcello Moores, surgical resection would be considered if he tolerates neoadjuvant chemotherapy and radiation. -Due to his advanced age and some comorbidities, I'll consider low-dose chemotherapy capecitabine 1500mg  (725mg /m2, instead of standard 825mg /m2) with concurrent radiation. He is tolerating well  -Lab reviewed with him, mild anemia, CMP normal. He will continue chemoradiation and finish in 2 days  -Moderate skin dermatitis from radiation at either perianal area  2. Low  back pain -secondary to spinal stenosis and rectal pain, It has much improved since he started the chemoradiation. -controlled by vicodin, he takes 2 tablets 1 hour before his radiation.  3. HTN, AF, arthritis -He will continue follow-up with his primary care physician -will watch his blood pressure and heart rate to closely during the concurrent chemoradiation.  4. Anemia -developed after chemoRT -mild, will monitor   5. Skin cracks on fingers  -Likely secondary to chemotherapy. -No signs of infection. I encouraged him to use a liquid bandage and moisturizer.  Plan -Continue concurrent chemoradiation, he will complete in 2 days. -he will have MRI on 1/19 and see Dr. Marcello Moores on 1/23 -I will see him back in 3 weeks    All questions were answered. The patient knows to call the clinic with any problems, questions or concerns. I spent 15 minutes counseling the patient face to face. The total time spent in the appointment was 20 minutes and more than 50% was on counseling.     [name], MD 09/18/2015 11:24 AM

## 2015-09-19 ENCOUNTER — Ambulatory Visit
Admission: RE | Admit: 2015-09-19 | Discharge: 2015-09-19 | Disposition: A | Discharge status: Home / Self Care | Payer: Medicare Other | Source: Ambulatory Visit | Attending: Radiation Oncology | Admitting: Radiation Oncology

## 2015-09-19 DIAGNOSIS — Z510 Encounter for antineoplastic radiation therapy: Secondary | ICD-10-CM | POA: Diagnosis not present

## 2015-09-19 DIAGNOSIS — C20 Malignant neoplasm of rectum: Secondary | ICD-10-CM | POA: Diagnosis not present

## 2015-09-20 ENCOUNTER — Ambulatory Visit
Admission: RE | Admit: 2015-09-20 | Discharge: 2015-09-20 | Disposition: A | Discharge status: Home / Self Care | Payer: Medicare Other | Source: Ambulatory Visit | Attending: Radiation Oncology | Admitting: Radiation Oncology

## 2015-09-20 ENCOUNTER — Encounter: Payer: Self-pay | Admitting: Radiation Oncology

## 2015-09-20 VITALS — BP 122/56 | HR 60 | Temp 97.6°F | Resp 20 | Wt 183.2 lb

## 2015-09-20 DIAGNOSIS — C20 Malignant neoplasm of rectum: Secondary | ICD-10-CM | POA: Diagnosis not present

## 2015-09-20 DIAGNOSIS — Z510 Encounter for antineoplastic radiation therapy: Secondary | ICD-10-CM | POA: Diagnosis not present

## 2015-09-20 NOTE — Progress Notes (Signed)
Department of Radiation Oncology  Phone:  [phone] Fax:        [phone]  Weekly Treatment Note    Name: Nathaniel [name] Date: 09/20/2015 MRN: [id number] DOB: 1919/11/04   Diagnosis:     ICD-9-CM ICD-10-CM   1. Rectal cancer ([facility]) 154.1 C20      Current dose: 50.4 Gy  Current fraction: 28   MEDICATIONS: Current Outpatient Prescriptions  Medication Sig Dispense Refill  . aspirin 325 MG tablet Take 325 mg by mouth daily.     . finasteride (PROSCAR) 5 MG tablet Take 5 mg by mouth daily. Dose is taken at bedtime    . HYDROcodone-acetaminophen (NORCO/VICODIN) 5-325 MG tablet Take 1 tablet by mouth every 6 (six) hours as needed for moderate pain. Take 30 min to 1 hour before MRI 30 tablet 0  . levothyroxine (SYNTHROID, LEVOTHROID) 88 MCG tablet Take 88 mcg by mouth daily before breakfast.     . metoprolol succinate (TOPROL-XL) 25 MG 24 hr tablet Take 0.5 tablets (12.5 mg total) by mouth daily. 45 tablet 2  . Wound Dressings (SONAFINE) Apply 1 application topically 2 (two) times daily.    . XELODA 500 MG tablet Take 1,500 mg by mouth 2 (two) times daily.   2   No current facility-administered medications for this encounter.     ALLERGIES: Cinnamon; Ibuprofen; Percocet; Protonix; and Xarelto   LABORATORY DATA:  Lab Results  Component Value Date   WBC 3.9* 09/18/2015   HGB 11.4* 09/18/2015   HCT 34.6* 09/18/2015   MCV 98.3* 09/18/2015   PLT 219 09/18/2015   Lab Results  Component Value Date   NA 134* 09/18/2015   K 4.4 09/18/2015   CL 102 03/15/2014   CO2 23 09/18/2015   Lab Results  Component Value Date   ALT 20 09/18/2015   AST 25 09/18/2015   ALKPHOS 79 09/18/2015   BILITOT 0.58 09/18/2015     NARRATIVE: Nathaniel [name] was seen today for weekly treatment management. The chart was checked and the patient's films were reviewed.  weekly rad tx  Rectal  Completed  28/28, using sonafine cream to bottom after rad txs,says it helps  The irritation   A lot, did have an accident of loose stool here this am , eating fair, , has urgency  With voiding, no incontinence, , slight fatigue, gave 1 month follow up appt, says his stool has changed now getting some formed solid stools, saw Dr. Burr Medico 09/18/15, has MR/MRI abd/chest pelvivsi 09/25/15, arrive at [place] and nothing to eat or drink 4 hours prior discussed again with patient 10:18 AM BP 122/56 mmHg  Pulse 60  Temp(Src) 97.6 F (36.4 C) (Oral)  Resp 20  Wt 183 lb 3.2 oz (83.099 kg)  SpO2 97%  Wt Readings from Last 3 Encounters:  09/20/15 183 lb 3.2 oz (83.099 kg)  09/18/15 182 lb 11.2 oz (82.872 kg)  09/13/15 183 lb 11.2 oz (83.326 kg)   Gaspar Garbe, RN II Rad/Onc  PHYSICAL EXAMINATION: weight is 183 lb 3.2 oz (83.099 kg). His oral temperature is 97.6 F (36.4 C). His blood pressure is 122/56 and his pulse is 60. His respiration is 20 and oxygen saturation is 97%.        ASSESSMENT: The patient is doing satisfactorily with treatment.  PLAN: We will continue with the patient's radiation treatment as planned. The patient finished his final fraction today. She will follow-up in our clinic in 1 month. The patient has an upcoming  MRI scan scheduled for reassessment.

## 2015-09-20 NOTE — Progress Notes (Addendum)
weekly rad tx  Rectal  Completed  28/28, using sonafine cream to bottom after rad txs,says it helps  The irritation  A lot, did have an accident of loose stool here this am , eating fair, , has urgency  With voiding, no incontinence, , slight fatigue, gave 1 month follow up appt, says his stool has changed now getting some formed solid stools, saw Dr. Burr Medico 09/18/15, has MR/MRI abd/chest pelvivsi 09/25/15, arrive at [place] and nothing to eat or drink 4 hours prior discussed again with patient 9:58 AM BP 122/56 mmHg  Pulse 60  Temp(Src) 97.6 F (36.4 C) (Oral)  Resp 20  Wt 183 lb 3.2 oz (83.099 kg)  SpO2 97%  Wt Readings from Last 3 Encounters:  09/20/15 183 lb 3.2 oz (83.099 kg)  09/18/15 182 lb 11.2 oz (82.872 kg)  09/13/15 183 lb 11.2 oz (83.326 kg)   Gaspar Garbe, RN II Rad/Onc

## 2015-09-22 DIAGNOSIS — C20 Malignant neoplasm of rectum: Secondary | ICD-10-CM | POA: Diagnosis not present

## 2015-09-23 ENCOUNTER — Other Ambulatory Visit: Payer: Self-pay | Admitting: *Deleted

## 2015-09-23 ENCOUNTER — Telehealth: Payer: Self-pay | Admitting: *Deleted

## 2015-09-23 NOTE — Telephone Encounter (Signed)
Oncology Nurse Navigator Documentation  Oncology Nurse Navigator Flowsheets 09/23/2015  Navigator Location [facility]  Navigator Encounter Type Telephone  Telephone Incoming Call  Abnormal Finding Date 07/16/2015  Confirmed Diagnosis Date 07/16/2015  Treatment Initiated Date 08/12/2015  Patient Visit Type -  Treatment Phase Other  Barriers/Navigation Needs Coordination of Care--No follow up appointment  Education -  Interventions Coordination of Care  Coordination of Care Appts--POF to scheduler for 3 week follow up with Dr. Burr Medico per office note  Education Method -  Support Groups/Services -  Acuity Level 1  Time Spent with Patient Nathaniel [name] called to inquire when his next visit with Dr. Burr Medico is?

## 2015-09-25 ENCOUNTER — Telehealth: Payer: Self-pay | Admitting: Hematology

## 2015-09-25 ENCOUNTER — Ambulatory Visit (HOSPITAL_COMMUNITY)
Admission: RE | Admit: 2015-09-25 | Discharge: 2015-09-25 | Disposition: A | Discharge status: Home / Self Care | Payer: Medicare Other | Source: Ambulatory Visit | Attending: General Surgery | Admitting: General Surgery

## 2015-09-25 ENCOUNTER — Other Ambulatory Visit: Payer: Self-pay | Admitting: General Surgery

## 2015-09-25 DIAGNOSIS — C20 Malignant neoplasm of rectum: Secondary | ICD-10-CM

## 2015-09-25 DIAGNOSIS — Z9221 Personal history of antineoplastic chemotherapy: Secondary | ICD-10-CM | POA: Diagnosis not present

## 2015-09-25 DIAGNOSIS — Z923 Personal history of irradiation: Secondary | ICD-10-CM | POA: Insufficient documentation

## 2015-09-25 DIAGNOSIS — D490 Neoplasm of unspecified behavior of digestive system: Secondary | ICD-10-CM | POA: Diagnosis not present

## 2015-09-25 NOTE — Telephone Encounter (Signed)
S/w pt gave appt 2/6 @ 1.30pm.

## 2015-09-27 DIAGNOSIS — M545 Low back pain: Secondary | ICD-10-CM | POA: Diagnosis not present

## 2015-09-27 DIAGNOSIS — M4806 Spinal stenosis, lumbar region: Secondary | ICD-10-CM | POA: Diagnosis not present

## 2015-09-27 DIAGNOSIS — M5136 Other intervertebral disc degeneration, lumbar region: Secondary | ICD-10-CM | POA: Diagnosis not present

## 2015-09-27 DIAGNOSIS — M47816 Spondylosis without myelopathy or radiculopathy, lumbar region: Secondary | ICD-10-CM | POA: Diagnosis not present

## 2015-09-27 DIAGNOSIS — M4310 Spondylolisthesis, site unspecified: Secondary | ICD-10-CM | POA: Diagnosis not present

## 2015-09-30 DIAGNOSIS — C20 Malignant neoplasm of rectum: Secondary | ICD-10-CM | POA: Diagnosis not present

## 2015-10-03 ENCOUNTER — Other Ambulatory Visit: Payer: Self-pay | Admitting: Hematology

## 2015-10-11 DIAGNOSIS — H02051 Trichiasis without entropian right upper eyelid: Secondary | ICD-10-CM | POA: Diagnosis not present

## 2015-10-11 DIAGNOSIS — H02054 Trichiasis without entropian left upper eyelid: Secondary | ICD-10-CM | POA: Diagnosis not present

## 2015-10-11 DIAGNOSIS — H02052 Trichiasis without entropian right lower eyelid: Secondary | ICD-10-CM | POA: Diagnosis not present

## 2015-10-11 DIAGNOSIS — H02055 Trichiasis without entropian left lower eyelid: Secondary | ICD-10-CM | POA: Diagnosis not present

## 2015-10-14 ENCOUNTER — Telehealth: Payer: Self-pay | Admitting: Hematology

## 2015-10-14 ENCOUNTER — Ambulatory Visit (HOSPITAL_BASED_OUTPATIENT_CLINIC_OR_DEPARTMENT_OTHER): Payer: Medicare Other | Admitting: Hematology

## 2015-10-14 ENCOUNTER — Encounter: Payer: Self-pay | Admitting: Hematology

## 2015-10-14 ENCOUNTER — Ambulatory Visit (HOSPITAL_BASED_OUTPATIENT_CLINIC_OR_DEPARTMENT_OTHER): Payer: Medicare Other

## 2015-10-14 VITALS — BP 128/40 | HR 64 | Temp 97.5°F | Resp 18 | Ht 72.0 in | Wt 183.0 lb

## 2015-10-14 DIAGNOSIS — R239 Unspecified skin changes: Secondary | ICD-10-CM | POA: Diagnosis not present

## 2015-10-14 DIAGNOSIS — C20 Malignant neoplasm of rectum: Secondary | ICD-10-CM

## 2015-10-14 DIAGNOSIS — I10 Essential (primary) hypertension: Secondary | ICD-10-CM

## 2015-10-14 DIAGNOSIS — D6481 Anemia due to antineoplastic chemotherapy: Secondary | ICD-10-CM | POA: Diagnosis not present

## 2015-10-14 DIAGNOSIS — M1990 Unspecified osteoarthritis, unspecified site: Secondary | ICD-10-CM | POA: Diagnosis not present

## 2015-10-14 DIAGNOSIS — M545 Low back pain: Secondary | ICD-10-CM

## 2015-10-14 DIAGNOSIS — I4891 Unspecified atrial fibrillation: Secondary | ICD-10-CM | POA: Diagnosis not present

## 2015-10-14 DIAGNOSIS — I480 Paroxysmal atrial fibrillation: Secondary | ICD-10-CM

## 2015-10-14 LAB — COMPREHENSIVE METABOLIC PANEL
ALBUMIN: 3 g/dL — AB (ref 3.5–5.0)
ALK PHOS: 100 U/L (ref 40–150)
ALT: 14 U/L (ref 0–55)
ANION GAP: 7 meq/L (ref 3–11)
AST: 18 U/L (ref 5–34)
BUN: 13.1 mg/dL (ref 7.0–26.0)
CALCIUM: 8.9 mg/dL (ref 8.4–10.4)
CO2: 27 mEq/L (ref 22–29)
Chloride: 106 mEq/L (ref 98–109)
Creatinine: 0.8 mg/dL (ref 0.7–1.3)
EGFR: 78 mL/min/{1.73_m2} — AB (ref >90)
Glucose: 107 mg/dl (ref 70–140)
POTASSIUM: 4.2 meq/L (ref 3.5–5.1)
Sodium: 140 mEq/L (ref 136–145)
Total Bilirubin: 0.36 mg/dL (ref 0.20–1.20)
Total Protein: 6.2 g/dL — ABNORMAL LOW (ref 6.4–8.3)

## 2015-10-14 LAB — CBC WITH DIFFERENTIAL/PLATELET
BASO%: 1.1 % (ref 0.0–2.0)
BASOS ABS: 0 10*3/uL (ref 0.0–0.1)
EOS ABS: 0.1 10*3/uL (ref 0.0–0.5)
EOS%: 2.9 % (ref 0.0–7.0)
HEMATOCRIT: 36 % — AB (ref 38.4–49.9)
HEMOGLOBIN: 11.6 g/dL — AB (ref 13.0–17.1)
LYMPH#: 0.3 10*3/uL — AB (ref 0.9–3.3)
LYMPH%: 8.4 % — ABNORMAL LOW (ref 14.0–49.0)
MCH: 32.5 pg (ref 27.2–33.4)
MCHC: 32.3 g/dL (ref 32.0–36.0)
MCV: 100.5 fL — AB (ref 79.3–98.0)
MONO#: 0.6 10*3/uL (ref 0.1–0.9)
MONO%: 15.9 % — AB (ref 0.0–14.0)
NEUT#: 2.9 10*3/uL (ref 1.5–6.5)
NEUT%: 71.7 % (ref 39.0–75.0)
PLATELETS: 238 10*3/uL (ref 140–400)
RBC: 3.58 10*6/uL — ABNORMAL LOW (ref 4.20–5.82)
RDW: 18.6 % — AB (ref 11.0–14.6)
WBC: 4.1 10*3/uL (ref 4.0–10.3)

## 2015-10-14 NOTE — Telephone Encounter (Signed)
per pof to sch pt appt-gave pt copy of avs °

## 2015-10-14 NOTE — Progress Notes (Signed)
[place]  [phone]) [phone] [phone]) [phone]  Clinic follow up Note   Patient Care Team: [name], MD as PCP - General (Family Medicine) Ronald Lobo, MD as Consulting Physician (Gastroenterology) Tania Ade, RN as Registered Nurse 10/14/2015   CHIEF COMPLAINTS:  Follow up rectal cancer  Oncology History    Rectal cancer [facility])   Staging form: Colon and Rectum, AJCC 7th Edition     Clinical stage from 08/08/2015: Stage IIIB (T3, N1, M0) - Unsigned       Rectal cancer ([place])   07/16/2015 Pathology Results Biopsy positive for invasive adenocarcinoma with signet ring cell features   07/23/2015 Initial Diagnosis Rectal cancer ([place])   08/12/2015 Concurrent Chemotherapy Xeloda '1500mg'$  q12h on the day of radiation    08/12/2015 - 09/20/2015 Radiation Therapy radiaiton to rectal cancer      HISTORY OF PRESENTING ILLNESS:  Nathaniel [name] 82 y.o. male is here because of recently diagnosed rectal cancer. He presents to the clinic by himself.  He has had abnormal bowel movement for the past 2-3 months. It's usually very small amount, loose, every time he urinates, he has a such a small bowel movement. He does not really have a good normal bowel movement. He also reports frequent stool stains on his underwear. He noticed small amount blood in his stool recently, which prompt his seeing GI Dr. Cristina Gong on. He underwent a flexible sigmoidoscopy in the office, which showed a rectal mass, biopsy reviewed adenocarcinoma. He denies significant rectal pain, abdominal discomfort, or other new complaints.  He lives in a independent living facility, he is able to take care of his all ADLs, and does some light housework, shopping, by himself. He uses a walker, still drives. He does get fatigued after activity, his energy level has seemed to be decreasing in over the past few years. No significant change daily. He has decent appetite and eats well.  He had remote history  of ulcerative colitis, which has been in remission. 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  He is widowed, has 5 children, who all live in out states. He does have several stepchildren from his secondary to, and some of them live in [place].  CURRENT TREATMENT: observation   INTERIM HISTORY Nathaniel [name] returns for follow-up. He completed chemotherapy and radiation on 09/20/2015. He tolerated the treatment well overall. His energy level has improved lately, is able to move around more, also it has not back to his baseline yet. His bowel movement is more regular and formed, the size of stool has increased, occasionally he sees little blood in the stool. No rectal pain, abdominal bloating, nausea, or other GI symptoms. He has met with colorectal surgeon Dr. Marcello Moores last week, and declined surgical resection.   MEDICAL HISTORY:  Past Medical History  Diagnosis Date  . Thyroid disease   . Hypercholesteremia   . HTN (hypertension)   . Hypothyroidism   . Cancer ([place]) 07/16/15    Rectal Cancer=adenocarcinoma  . Allergy   . Atrial fibrillation ([place])   . Pericarditis   . 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 Kidney stone   . Arthritis     SURGICAL HISTORY: Past Surgical History  Procedure Laterality Date  . Shoulder surgery Right 2013    rotator cuff   . Knee surgery Left 73 years ago  . Ankle fracture surgery Left 81 years ago  .  Hernia repair  28 years ago  . Mohs surgery    . Eye surgery    . Eus N/A 08/08/2015    Procedure: LOWER ENDOSCOPIC ULTRASOUND (EUS);  Surgeon: Willis Modena, MD;  Location: [facility] ENDOSCOPY;  Service: Endoscopy;  Laterality: N/A;  . Flexible sigmoidoscopy N/A 08/08/2015    Procedure: FLEXIBLE SIGMOIDOSCOPY;  Surgeon: Willis Modena, MD;  Location: [facility] ENDOSCOPY;  Service: Endoscopy;  Laterality: N/A;  poor prep    SOCIAL HISTORY: Social History   Social History  .  Marital Status: Widowed     Spouse Name: N/A  . Number of Children: 4 daughters and one son, and 5 stepchildren   . Years of Education: N/A   Occupational History  . 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 Smoking status: Never Smoker   . Smokeless tobacco: Not on file  . Alcohol Use: 0.6 oz/week    1 Glasses of wine per week     Comment: 1 glass of wine  . Drug Use: No  . Sexual Activity: Not on file   Other Topics Concern  . Not on file   Social History Narrative    FAMILY HISTORY: Family History  Problem Relation Age of Onset  . Stroke Father   . Cancer Mother     spine ,died of parkinson's    ALLERGIES:  is allergic to cinnamon; ibuprofen; percocet; protonix; and xarelto.  MEDICATIONS:  Current Outpatient Prescriptions  Medication Sig Dispense Refill  . aspirin 325 MG tablet Take 325 mg by mouth daily.     . finasteride (PROSCAR) 5 MG tablet Take 5 mg by mouth daily. Dose is taken at bedtime    . levothyroxine (SYNTHROID, LEVOTHROID) 88 MCG tablet Take 88 mcg by mouth daily before breakfast.     . metoprolol succinate (TOPROL-XL) 25 MG 24 hr tablet Take 0.5 tablets (12.5 mg total) by mouth daily. 45 tablet 2  . XELODA 500 MG tablet Take 1,500 mg by mouth 2 (two) times daily. Reported on 10/14/2015  2   No current facility-administered medications for this visit.    REVIEW OF SYSTEMS:   Constitutional: Denies fevers, chills or abnormal night sweats Eyes: Denies blurriness of vision, double vision or watery eyes Ears, nose, mouth, throat, and face: Denies mucositis or sore throat Respiratory: Denies cough, dyspnea or wheezes Cardiovascular: Denies palpitation, chest discomfort or lower extremity swelling Gastrointestinal:  Denies nausea, heartburn or change in bowel habits Skin: Denies abnormal skin rashes Lymphatics: Denies new lymphadenopathy or easy bruising Neurological:Denies numbness, tingling or new  weaknesses Behavioral/Psych: Mood is stable, no new changes  All other systems were reviewed with the patient and are negative.  PHYSICAL EXAMINATION: ECOG PERFORMANCE STATUS: 2  Filed Vitals:   10/14/15 1338  BP: 128/40  Pulse: 64  Temp: 97.5 F (36.4 C)  Resp: 18   Filed Weights   10/14/15 1338  Weight: 183 lb (83.008 kg)    GENERAL:alert, no distress and comfortable SKIN: skin color, texture, turgor are normal, no rashes or significant lesions except dry skin on hands, and a few skin cracks on the tip of some fingers. EYES: normal, conjunctiva are pink and non-injected, sclera clear OROPHARYNX:no exudate, no erythema and lips, buccal mucosa, and tongue normal  NECK: supple, thyroid normal size, non-tender, without nodularity LYMPH:  no palpable lymphadenopathy in the cervical, axillary or inguinal LUNGS: clear to auscultation and percussion with normal breathing effort HEART: regular rate & rhythm  and no murmurs and no lower extremity edema ABDOMEN:abdomen soft, non-tender and normal bowel sounds.  PSYCH: alert & oriented x 3 with fluent speech.  NEURO: no focal motor/sensory deficits   LABORATORY DATA:  I have reviewed the data as listed CBC Latest Ref Rng 10/14/2015 09/18/2015 09/11/2015  WBC 4.0 - 10.3 10e3/uL 4.1 3.9(L) 4.1  Hemoglobin 13.0 - 17.1 g/dL 11.6(L) 11.4(L) 11.6(L)  Hematocrit 38.4 - 49.9 % 36.0(L) 34.6(L) 35.0(L)  Platelets 140 - 400 10e3/uL 238 219 191    Recent Labs  09/11/15 0904 09/18/15 1003 10/14/15 1431  NA 137 134* 140  K 4.2 4.4 4.2  CO2 '24 23 27  '$ GLUCOSE 98 101 107  BUN 15.6 16.0 13.1  CREATININE 0.8 0.8 0.8  CALCIUM 8.6 8.7 8.9  PROT 5.8* 5.9* 6.2*  ALBUMIN 3.1* 3.2* 3.0*  AST '22 25 18  '$ ALT '18 20 14  '$ ALKPHOS 81 79 100  BILITOT 0.59 0.58 0.36    PATHOLOGY REPORT  Diagnosis 07/16/2015  Rectum, biopsy, mass - INVASIVE ADENOCARCINOMA WITH SIGNET RING CELL FEATURES. - LYMPHOVASCULAR INVASION IS IDENTIFIED. - SEE  COMMENT. Microscopic Comment To help evaluate the case, immunohistochemistry stains were performed. The malignant cells are positive for CDX2 and cytokeratin AE1/AE3. There is focal staining for CD56, a neuroendocrine marker. Prostein and S-100 stains are negative. Overall, the features are consistent with invasive adenocarcinoma of colonic origin, with signet ring cell features. The case was discussed with Dr. Cristina Gong on 07/18/15. (JBK:ds 07/18/15)   RADIOGRAPHIC STUDIES: I have personally reviewed the radiological images as listed and agreed with the findings in the report. Nathaniel [name]  09/25/2015  CLINICAL DATA:  Staging rectal cancer after radiation therapy/chemotherapy EXAM: MRI PELVIS WITHOUT [name] TECHNIQUE: Multiplanar multisequence Nathaniel imaging of the pelvis was performed. No intravenous [name] was administered. Small amount of [place] gel was administered per rectum to optimize tumor evaluation. COMPARISON:  None. FINDINGS: TUMOR LOCATION Location from Anal Verge:  Low 0-5 cm Shortest Distance from Tumor to Anal Sphincter:  3 cm TUMOR DESCRIPTION Circumferential Extent: Near circumferential along the posterior rectum, extending from 1 o'clock to 11 o'clock Craniocaudal Extent:  3.5 cm T - CATEGORY Extension through Muscularis Propria: No = likely T2 with extension into but not through the muscularis propria along the left posterolateral margin (series 6/image 25) Maximum extension beyond Muscularis Propria:  0 mm Extramural vascular invasion/tumor thrombus:  No Shortest distance of any tumor/node from Mesorectal Fascia:  7 mm Invasion of Anterior Peritoneal Reflection:  No Involvement of Adjacent Organs or Pelvic Sidewall Structures:  No N - CATEGORY Mesorectal Lymph Nodes >=57m: No, single 4 mm right perirectal node at the 9 o'clock position (series 6/image 9) Extra-mesorectal Lymphadenopathy:  No Other: Prostatomegaly, with enlargement of the central gland which indents the base of the  bladder. Multiple layering bladder calculi. Small fat containing right inguinal hernia. Deformity/AVN of the left femoral head. IMPRESSION: 3.5 cm near circumferential posterior lower rectal tumor, 3.0 cm from the anal verge, as above. Rectal adenocarcinoma T stage:  T2. Rectal adenocarcinoma N stage: N0. Single 4 mm right perirectal node at the 9 o'clock position is beneath the size threshold for N1 disease. Distance from tumor to the anal sphincter is 3 cm. Electronically Signed   By: SJulian HyM.D.   On: 09/25/2015 09:23     Flexible  Sigmoidoscopy 07/16/2015 by Dr. BCristina Gong impression , an infiltrative no obstructing large mass was found in the distal rectum. The mass was partially  circumferential (involving one third of the lumen circumference ). The mass measured 5 cm in length.  Biopsy was obtained.  LOWER ENDOSCOPY [place] 08/08/2015  STAGING: T3 N1 Mx by endorectal ultrasound (limited study). ENDOSCOPIC IMPRESSION: Poor prep; unable to do colonoscopy. Rectal mass (adenocarcinoma on biopsies). Staging as above.    ASSESSMENT & PLAN:  82 year old Caucasian male, with past medical history of ulcerative colitis, hypertension and hypothyroidism, who lives in a independent living, presented with bowel habits change in the mild rectal bleeding.  1. Low rectal adenocarcinoma, cT3N1M0, stage IIIB  -I previously reviewed his sigmoidoscopy findings and the biopsy results in great detail with him. -I reviewed his PET CT scan result with him in details. He appears to have regional lymphadenopathy, but no evidence of distant metastasis. -lower endoscopy US showed a T3N1 tumor, unable to do colonoscopy due to poor prep. -He completed neoadjuvant chemotherapy and radiation, tolerated very well overall. His symptoms improved after treatment. -I reviewed his restaging pelvic MRI findings, which showed a cT2N0 disease, down staged from the initial T3N1  -Colorectal surgeon Dr. Marcello Moores has offered him  surgical resection, however patient is concerned about his advanced age and long recovery, has declined the surgery. -I discussed that his rectal cancer is unlikely cured by neoadjuvant chemotherapy and radiation alone. He will likely grow again without surgical resection, which can cause obstruction, bleeding and pain. He may need diverging colostomy down the road.  -Rectal stent basement may not be an option due to the distal location -We discussed the option of systemic chemotherapy, likely Xeloda alone given his advanced age for palliative purpose, if he develops symptomatic cancer progression. -After the lengthy discussion about the nonsurgical options, patient still leaning towards non-surgery, but he will think about again in the let us know in a few weeks. I told him that we will support his decision regardless which way he wants to pursue. -If he still declines surgery, we'll start monitoring his cancer, and he is agreeable for follow up.   2. Low back pain -secondary to spinal stenosis and rectal pain, It has much improved since he started the chemoradiation. -He has Vicodin, but does not take much  3. HTN, AF, arthritis -He will continue follow-up with his primary care physician -will watch his blood pressure and heart rate to closely during the concurrent chemoradiation.  4. Anemia -developed after chemoRT -mild, will monitor   5. Skin cracks on fingers  -Likely secondary to chemotherapy. -No signs of infection. I encouraged him to use a liquid bandage and moisturizer.  Plan -he will think about surgery again and let us know his final decision in a few weeks   -I'll see him back in 2 months for follow-up.  All questions were answered. The patient knows to call the clinic with any problems, questions or concerns. I spent 25 minutes counseling the patient face to face. The total time spent in the appointment was 30 minutes and more than 50% was on counseling.     [name],  MD 10/14/2015 7:40 PM

## 2015-10-16 NOTE — Progress Notes (Signed)
  Radiation Oncology         [phone] ________________________________  Name: Nathaniel [name] MRN: [id number]  Date: 09/20/2015  DOB: July 18, 1920  End of Treatment Note  Diagnosis:   Rectal cancer    Rectal cancer [facility])   Staging form: Colon and Rectum, AJCC 7th Edition     Clinical stage from 08/08/2015: Stage IIIB (T3, N1, M0) - Unsigned   Indication for treatment:  Curative       Radiation treatment dates:   08/12/15 through 09/20/2015  Site/dose:    The patient was treated to the pelvis to a dose of 45 Gy at 1.8 Gy per fraction. This was accomplished using a 4 field 3-D conformal technique. The patient then received a boost to the tumor and adjacent high-risk regions for an additional 5.4 Gy at 1.8 gray per fraction. This was carried out using a coned-down 4 field approach. The patient's total dose was 50.4 Gy. Daily AlignRT was used on a daily basis to insure proper patient positioning and localization of critical targets/ structures. The patient received concurrent chemotherapy during the course of radiation treatment.  Narrative: The patient tolerated radiation treatment relatively well.   Skin irritation is managed during his course of treatment.  Plan: The patient has completed radiation treatment. The patient will return to radiation oncology clinic for routine followup in one month. I advised the patient to call or return sooner if they have any questions or concerns related to their recovery or treatment.   ------------------------------------------------  Jodelle Gross, MD, PhD

## 2015-10-17 ENCOUNTER — Telehealth: Payer: Self-pay | Admitting: General Surgery

## 2015-10-17 ENCOUNTER — Telehealth: Payer: Self-pay | Admitting: *Deleted

## 2015-10-17 NOTE — Telephone Encounter (Signed)
Oncology Nurse Navigator Documentation  Oncology Nurse Navigator Flowsheets 10/17/2015  Navigator Location [facility]  Navigator Encounter Type Telephone  Telephone Incoming Call;Patient Update: wanted MD aware that he has decided not to pursue the surgery. Call him back if this changes any follow up he needs with Dr. Burr Medico. Next OV is 12/12/15.  Abnormal Finding Date -  Confirmed Diagnosis Date -  Treatment Initiated Date -  Patient Visit Type -  Treatment Phase -  Barriers/Navigation Needs -  Education -  Interventions -  Coordination of Care -  Education Method -  Support Groups/Services -  Acuity -  Time Spent with Patient -

## 2015-10-17 NOTE — Telephone Encounter (Signed)
Nathaniel [name], I saw him early this week and he has decided not to have surgery. I will follow him.  [name]  10/17/2015

## 2015-10-17 NOTE — Telephone Encounter (Signed)
Patient called and stated he felt he was too old to undergo surgery and did not wish to proceed with this therapy.  He voiced understanding that his tumor will most likely progress.  He understands that in the future he may need a diverting colostomy and that if his tumor continues to grow, he may have trouble with severe pain and bleeding and that these will most likely on be able to be treated palliatively.

## 2015-10-17 NOTE — Telephone Encounter (Signed)
-----   Message from Mammie Lorenzo sent at 10/16/2015  9:18 AM EST ----- Regarding: cancel surgery Contact: [phone] Pt had called office wanting to speak with you. He made me aware that he is definitely cancelling surgery. He would like for you to call him so he can talk with you and he give you his reason's. He knows you are in surgery for the next couple of days but would appreciate it if you could call...KD

## 2015-10-21 ENCOUNTER — Encounter: Payer: Self-pay | Admitting: Radiation Oncology

## 2015-10-24 ENCOUNTER — Encounter: Payer: Self-pay | Admitting: Radiation Oncology

## 2015-10-24 ENCOUNTER — Telehealth: Payer: Self-pay | Admitting: *Deleted

## 2015-10-24 ENCOUNTER — Ambulatory Visit
Admission: RE | Admit: 2015-10-24 | Discharge: 2015-10-24 | Disposition: A | Discharge status: Home / Self Care | Payer: Medicare Other | Source: Ambulatory Visit | Attending: Radiation Oncology | Admitting: Radiation Oncology

## 2015-10-24 VITALS — BP 140/65 | HR 64 | Temp 97.5°F | Resp 20 | Ht 72.0 in | Wt 184.7 lb

## 2015-10-24 DIAGNOSIS — C20 Malignant neoplasm of rectum: Secondary | ICD-10-CM

## 2015-10-24 HISTORY — DX: Personal history of irradiation: Z92.3

## 2015-10-24 NOTE — Progress Notes (Signed)
Patient arrived via w/c, follow up  MR pelvis result on 09/25/15 in, patient decided no surgery,, placed yellow fall risk band on patient's left wrist, stated his bottom has healed up well, sonafine  Cream really helped stated patient, appetite back to normal, no nausea, bowel movements regular  Formed stools now, for him he stated,  Has ointment on his eyelids, stated "rx cream for infection'  BP 140/65 mmHg  Pulse 64  Temp(Src) 97.5 F (36.4 C) (Oral)  Resp 20  Ht 6' (1.829 m)  Wt 184 lb 11.2 oz (83.779 kg)  BMI 25.04 kg/m2  SpO2 100%  Wt Readings from Last 3 Encounters:  10/24/15 184 lb 11.2 oz (83.779 kg)  10/14/15 183 lb (83.008 kg)  09/20/15 183 lb 3.2 oz (83.099 kg)

## 2015-10-24 NOTE — Telephone Encounter (Signed)
Called patient asked him how he would like the letter Dr. Lisbeth Renshaw arranged for him , patient wouyld like it mailed to him , thanked Mr. Meli, will get in the mail tomorrow morning 4:53 PM

## 2015-10-24 NOTE — Progress Notes (Signed)
Radiation Oncology         [phone] ________________________________  Name: MARSHEL ROLNICK MRN: [id number]  Date: 10/24/2015  DOB: 07-28-1920  Follow-Up Visit Note  CC: Gennette Pac, MD  Ladell Pier, MD  Diagnosis: rectal cancer Rectal cancer [facility])   Staging form: Colon and Rectum, AJCC 7th Edition     Clinical stage from 08/08/2015: Stage IIIB (T3, N1, M0) - Unsigned   Oncology History    Rectal cancer [facility])   Staging form: Colon and Rectum, AJCC 7th Edition     Clinical stage from 08/08/2015: Stage IIIB (T3, N1, M0) - Unsigned       Rectal cancer ([place])   07/16/2015 Pathology Results Biopsy positive for invasive adenocarcinoma with signet ring cell features   07/23/2015 Initial Diagnosis Rectal cancer ([place])   08/12/2015 Concurrent Chemotherapy Xeloda 1500mg  q12h on the day of radiation    08/12/2015 - 09/20/2015 Radiation Therapy radiaiton to rectal cancer      Narrative:  The patient returns today for routine follow-up.   Patient arrived via w/c, follow up  MR pelvis result on 09/25/15 in, patient decided no surgery,, placed yellow fall risk band on patient's left wrist, stated his bottom has healed up well, sonafine  Cream really helped stated patient, appetite back to normal, no nausea, bowel movements regular  Formed stools now, for him he stated,  Has ointment on his eyelids, stated "rx cream for infection'  BP 140/65 mmHg  Pulse 64  Temp(Src) 97.5 F (36.4 C) (Oral)  Resp 20  Ht 6' (1.829 m)  Wt 184 lb 11.2 oz (83.779 kg)  BMI 25.04 kg/m2  SpO2 100%  Wt Readings from Last 3 Encounters:  10/24/15 184 lb 11.2 oz (83.779 kg)  10/14/15 183 lb (83.008 kg)  09/20/15 183 lb 3.2 oz (83.099 kg)                       ALLERGIES:  is allergic to cinnamon; ibuprofen; percocet; protonix; and xarelto.  Meds: Current Outpatient Prescriptions  Medication Sig Dispense Refill  . aspirin 325 MG tablet Take 325 mg by mouth daily.     . finasteride (PROSCAR) 5 MG  tablet Take 5 mg by mouth daily. Dose is taken at bedtime    . levothyroxine (SYNTHROID, LEVOTHROID) 88 MCG tablet Take 88 mcg by mouth daily before breakfast.     . metoprolol succinate (TOPROL-XL) 25 MG 24 hr tablet Take 0.5 tablets (12.5 mg total) by mouth daily. 45 tablet 2   No current facility-administered medications for this encounter.    Physical Findings: The patient is in no acute distress. Patient is alert and oriented.  height is 6' (1.829 m) and weight is 184 lb 11.2 oz (83.779 kg). His oral temperature is 97.5 F (36.4 C). His blood pressure is 140/65 and his pulse is 64. His respiration is 20 and oxygen saturation is 100%. .      Lab Findings: Lab Results  Component Value Date   WBC 4.1 10/14/2015   HGB 11.6* 10/14/2015   HCT 36.0* 10/14/2015   MCV 100.5* 10/14/2015   PLT 238 10/14/2015     Radiographic Findings: Mr Pelvis Wo Contrast  09/25/2015  CLINICAL DATA:  Staging rectal cancer after radiation therapy/chemotherapy EXAM: MRI PELVIS WITHOUT CONTRAST TECHNIQUE: Multiplanar multisequence MR imaging of the pelvis was performed. No intravenous contrast was administered. Small amount of [place] gel was administered per rectum to optimize tumor evaluation. COMPARISON:  None. FINDINGS:  TUMOR LOCATION Location from Anal Verge:  Low 0-5 cm Shortest Distance from Tumor to Anal Sphincter:  3 cm TUMOR DESCRIPTION Circumferential Extent: Near circumferential along the posterior rectum, extending from 1 o'clock to 11 o'clock Craniocaudal Extent:  3.5 cm T - CATEGORY Extension through Muscularis Propria: No = likely T2 with extension into but not through the muscularis propria along the left posterolateral margin (series 6/image 25) Maximum extension beyond Muscularis Propria:  0 mm Extramural vascular invasion/tumor thrombus:  No Shortest distance of any tumor/node from Mesorectal Fascia:  7 mm Invasion of Anterior Peritoneal Reflection:  No Involvement of Adjacent Organs or Pelvic  Sidewall Structures:  No N - CATEGORY Mesorectal Lymph Nodes >=69mm: No, single 4 mm right perirectal node at the 9 o'clock position (series 6/image 9) Extra-mesorectal Lymphadenopathy:  No Other: Prostatomegaly, with enlargement of the central gland which indents the base of the bladder. Multiple layering bladder calculi. Small fat containing right inguinal hernia. Deformity/AVN of the left femoral head. IMPRESSION: 3.5 cm near circumferential posterior lower rectal tumor, 3.0 cm from the anal verge, as above. Rectal adenocarcinoma T stage:  T2. Rectal adenocarcinoma N stage: N0. Single 4 mm right perirectal node at the 9 o'clock position is beneath the size threshold for N1 disease. Distance from tumor to the anal sphincter is 3 cm. Electronically Signed   By: Julian Hy M.D.   On: 09/25/2015 09:23    Impression:    The patient is doing satisfactorily approximately one month after completing preoperative chemoradiotherapy for rectal cancer. The patient has decided to forego radiation treatment.   Plan:  The patient will followup in 4 months.    Jodelle Gross, MD, PhD

## 2015-10-25 ENCOUNTER — Telehealth: Payer: Self-pay | Admitting: *Deleted

## 2015-10-25 NOTE — Telephone Encounter (Signed)
CALLED PATIENT TO INFORM OF 4 MONTH FU WITH ALISON PERKINS ON 03-04-16, SPOKE WITH PATIENT AND HE IS AWARE OF THIS APPT.

## 2015-11-01 ENCOUNTER — Inpatient Hospital Stay: Admit: 2015-11-01 | Payer: BLUE CROSS/BLUE SHIELD | Admitting: General Surgery

## 2015-11-01 SURGERY — RESECTION, RECTUM, LOW ANTERIOR, ROBOT-ASSISTED
Anesthesia: Choice

## 2015-11-13 ENCOUNTER — Telehealth: Payer: Self-pay | Admitting: *Deleted

## 2015-11-13 DIAGNOSIS — H01005 Unspecified blepharitis left lower eyelid: Secondary | ICD-10-CM | POA: Diagnosis not present

## 2015-11-13 DIAGNOSIS — H01001 Unspecified blepharitis right upper eyelid: Secondary | ICD-10-CM | POA: Diagnosis not present

## 2015-11-13 DIAGNOSIS — H01004 Unspecified blepharitis left upper eyelid: Secondary | ICD-10-CM | POA: Diagnosis not present

## 2015-11-13 DIAGNOSIS — H01002 Unspecified blepharitis right lower eyelid: Secondary | ICD-10-CM | POA: Diagnosis not present

## 2015-11-13 NOTE — Telephone Encounter (Signed)
Oncology Nurse Navigator Documentation  Oncology Nurse Navigator Flowsheets 11/13/2015  Navigator Location [facility]  Navigator Encounter Type Telephone  Telephone Outgoing Call;Patient Update  Abnormal Finding Date 07/16/2015  Confirmed Diagnosis Date 07/16/2015  Treatment Initiated Date 08/12/2015  Patient Visit Type -  Treatment Phase Post-Tx Follow-up  Barriers/Navigation Needs -  Education -  Interventions -Left VM on home phone to f/u on status. Requested to return call if he as any questions or needs I can help him with. Otherwise will see him on 12/12/15 at 0900  Coordination of Care -  Education Method -  Support Groups/Services -  Acuity -  Time Spent with Patient -

## 2015-12-12 ENCOUNTER — Other Ambulatory Visit (HOSPITAL_BASED_OUTPATIENT_CLINIC_OR_DEPARTMENT_OTHER): Payer: Medicare Other

## 2015-12-12 ENCOUNTER — Telehealth: Payer: Self-pay | Admitting: Hematology

## 2015-12-12 ENCOUNTER — Ambulatory Visit (HOSPITAL_BASED_OUTPATIENT_CLINIC_OR_DEPARTMENT_OTHER): Payer: Medicare Other | Admitting: Hematology

## 2015-12-12 ENCOUNTER — Encounter: Payer: Self-pay | Admitting: Hematology

## 2015-12-12 VITALS — BP 158/57 | HR 66 | Temp 97.5°F | Resp 18 | Ht 72.0 in | Wt 188.7 lb

## 2015-12-12 DIAGNOSIS — K6289 Other specified diseases of anus and rectum: Secondary | ICD-10-CM

## 2015-12-12 DIAGNOSIS — D6481 Anemia due to antineoplastic chemotherapy: Secondary | ICD-10-CM

## 2015-12-12 DIAGNOSIS — C20 Malignant neoplasm of rectum: Secondary | ICD-10-CM | POA: Diagnosis not present

## 2015-12-12 DIAGNOSIS — I10 Essential (primary) hypertension: Secondary | ICD-10-CM | POA: Diagnosis not present

## 2015-12-12 DIAGNOSIS — I480 Paroxysmal atrial fibrillation: Secondary | ICD-10-CM

## 2015-12-12 DIAGNOSIS — M1990 Unspecified osteoarthritis, unspecified site: Secondary | ICD-10-CM | POA: Diagnosis not present

## 2015-12-12 DIAGNOSIS — T451X5A Adverse effect of antineoplastic and immunosuppressive drugs, initial encounter: Secondary | ICD-10-CM

## 2015-12-12 DIAGNOSIS — M545 Low back pain: Secondary | ICD-10-CM

## 2015-12-12 DIAGNOSIS — M4800 Spinal stenosis, site unspecified: Secondary | ICD-10-CM | POA: Diagnosis not present

## 2015-12-12 DIAGNOSIS — I4891 Unspecified atrial fibrillation: Secondary | ICD-10-CM

## 2015-12-12 LAB — CBC WITH DIFFERENTIAL/PLATELET
BASO%: 0.9 % (ref 0.0–2.0)
Basophils Absolute: 0 10*3/uL (ref 0.0–0.1)
EOS%: 3.1 % (ref 0.0–7.0)
Eosinophils Absolute: 0.1 10*3/uL (ref 0.0–0.5)
HEMATOCRIT: 37.7 % — AB (ref 38.4–49.9)
HEMOGLOBIN: 12.3 g/dL — AB (ref 13.0–17.1)
LYMPH#: 0.4 10*3/uL — AB (ref 0.9–3.3)
LYMPH%: 10.5 % — ABNORMAL LOW (ref 14.0–49.0)
MCH: 31.9 pg (ref 27.2–33.4)
MCHC: 32.8 g/dL (ref 32.0–36.0)
MCV: 97.4 fL (ref 79.3–98.0)
MONO#: 0.7 10*3/uL (ref 0.1–0.9)
MONO%: 17.5 % — AB (ref 0.0–14.0)
NEUT%: 68 % (ref 39.0–75.0)
NEUTROS ABS: 2.9 10*3/uL (ref 1.5–6.5)
Platelets: 188 10*3/uL (ref 140–400)
RBC: 3.87 10*6/uL — ABNORMAL LOW (ref 4.20–5.82)
RDW: 14.4 % (ref 11.0–14.6)
WBC: 4.3 10*3/uL (ref 4.0–10.3)

## 2015-12-12 LAB — COMPREHENSIVE METABOLIC PANEL
ALBUMIN: 3.2 g/dL — AB (ref 3.5–5.0)
ALK PHOS: 105 U/L (ref 40–150)
ALT: 13 U/L (ref 0–55)
AST: 17 U/L (ref 5–34)
Anion Gap: 7 mEq/L (ref 3–11)
BILIRUBIN TOTAL: 0.44 mg/dL (ref 0.20–1.20)
BUN: 15.2 mg/dL (ref 7.0–26.0)
CALCIUM: 9 mg/dL (ref 8.4–10.4)
CO2: 28 mEq/L (ref 22–29)
Chloride: 107 mEq/L (ref 98–109)
Creatinine: 0.8 mg/dL (ref 0.7–1.3)
EGFR: 76 mL/min/{1.73_m2} — ABNORMAL LOW (ref >90)
Glucose: 98 mg/dl (ref 70–140)
Potassium: 4.1 mEq/L (ref 3.5–5.1)
SODIUM: 142 meq/L (ref 136–145)
Total Protein: 6.5 g/dL (ref 6.4–8.3)

## 2015-12-12 NOTE — Progress Notes (Signed)
[place]  [phone] [phone]  Clinic follow up Note   Patient Care Team: [name], MD as PCP - General (Family Medicine) Ronald Lobo, MD as Consulting Physician (Gastroenterology) Tania Ade, RN as Registered Nurse 12/12/2015   CHIEF COMPLAINTS:  Follow up rectal cancer  Oncology History    Rectal cancer [facility])   Staging form: Colon and Rectum, AJCC 7th Edition     Clinical stage from 08/08/2015: Stage IIIB (T3, N1, M0) - Unsigned       Rectal cancer ([place])   07/16/2015 Pathology Results Biopsy positive for invasive adenocarcinoma with signet ring cell features   07/23/2015 Initial Diagnosis Rectal cancer ([place])   08/12/2015 Concurrent Chemotherapy Xeloda 1500mg  q12h on the day of radiation    08/12/2015 - 09/20/2015 Radiation Therapy radiaiton to rectal cancer      HISTORY OF PRESENTING ILLNESS:  Nathaniel [name] 82 y.o. male is here because of recently diagnosed rectal cancer. He presents to the clinic by himself.  He has had abnormal bowel movement for the past 2-3 months. It's usually very small amount, loose, every time he urinates, he has a such a small bowel movement. He does not really have a good normal bowel movement. He also reports frequent stool stains on his underwear. He noticed small amount blood in his stool recently, which prompt his seeing GI Dr. Cristina Gong on. He underwent a flexible sigmoidoscopy in the office, which showed a rectal mass, biopsy reviewed adenocarcinoma. He denies significant rectal pain, abdominal discomfort, or other new complaints.  He lives in a independent living facility, he is able to take care of his all ADLs, and does some light housework, shopping, by himself. He uses a walker, still drives. He does get fatigued after activity, his energy level has seemed to be decreasing in over the past few years. No significant change daily. He has decent appetite and eats well.  He had remote history  of ulcerative colitis, which has been in remission. 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  He is widowed, has 5 children, who all live in out states. He does have several stepchildren from his secondary to, and some of them live in [place].  CURRENT TREATMENT: observation   INTERIM HISTORY Nathaniel [name] returns for follow-up. He is doing well overall. His BM is pretty normal now, formed, once a day, occasionally loose BM, and  he has some blood on tissue. energy level back to his baseline, he lives independently in an apartment.    MEDICAL HISTORY:  Past Medical History  Diagnosis Date  . Thyroid disease   . Hypercholesteremia   . HTN (hypertension)   . Hypothyroidism   . Cancer ([place]) 07/16/15    Rectal Cancer=adenocarcinoma  . Allergy   . Atrial fibrillation ([place])   . Pericarditis   . 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 Kidney stone   . Arthritis   . S/P radiation therapy 08/12/15-09/20/15    rectal ca50.4Gy total dose    SURGICAL HISTORY: Past Surgical History  Procedure Laterality Date  . Shoulder surgery Right 2013    rotator cuff   . Knee surgery Left 73 years ago  . Ankle fracture surgery Left 81 years ago  . Hernia repair  28 years ago  . Mohs surgery    . Eye surgery    . Eus N/A 08/08/2015  Procedure: LOWER ENDOSCOPIC ULTRASOUND (EUS);  Surgeon: Arta Silence, MD;  [facility] ENDOSCOPY;  Service: Endoscopy;  Laterality: N/A;  . Flexible sigmoidoscopy N/A 08/08/2015    Procedure: FLEXIBLE SIGMOIDOSCOPY;  Surgeon: Arta Silence, MD;  Location: [facility] ENDOSCOPY;  Service: Endoscopy;  Laterality: N/A;  poor prep    SOCIAL HISTORY: Social History   Social History  . Marital Status: Widowed     Spouse Name: N/A  . Number of Children: 4 daughters and one son, and 5 stepchildren   . Years of Education: N/A   Occupational History  . 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 Smoking status: Never Smoker   . Smokeless tobacco: Not on file  . Alcohol Use: 0.6 oz/week    1 Glasses of wine per week     Comment: 1 glass of wine  . Drug Use: No  . Sexual Activity: Not on file   Other Topics Concern  . Not on file   Social History Narrative    FAMILY HISTORY: Family History  Problem Relation Age of Onset  . Stroke Father   . Cancer Mother     spine ,died of parkinson's    ALLERGIES:  is allergic to cinnamon; ibuprofen; percocet; protonix; and xarelto.  MEDICATIONS:  Current Outpatient Prescriptions  Medication Sig Dispense Refill  . aspirin 325 MG tablet Take 325 mg by mouth daily.     . finasteride (PROSCAR) 5 MG tablet Take 5 mg by mouth daily. Dose is taken at bedtime    . levothyroxine (SYNTHROID, LEVOTHROID) 88 MCG tablet Take 88 mcg by mouth daily before breakfast.     . metoprolol succinate (TOPROL-XL) 25 MG 24 hr tablet Take 0.5 tablets (12.5 mg total) by mouth daily. 45 tablet 2   No current facility-administered medications for this visit.    REVIEW OF SYSTEMS:   Constitutional: Denies fevers, chills or abnormal night sweats Eyes: Denies blurriness of vision, double vision or watery eyes Ears, nose, mouth, throat, and face: Denies mucositis or sore throat Respiratory: Denies cough, dyspnea or wheezes Cardiovascular: Denies palpitation, chest discomfort or lower extremity swelling Gastrointestinal:  Denies nausea, heartburn or change in bowel habits Skin: Denies abnormal skin rashes Lymphatics: Denies new lymphadenopathy or easy bruising Neurological:Denies numbness, tingling or new weaknesses Behavioral/Psych: Mood is stable, no new changes  All other systems were reviewed with the patient and are negative.  PHYSICAL EXAMINATION: ECOG PERFORMANCE STATUS: 2  Filed Vitals:   12/12/15 0918  BP: 158/57  Pulse: 66  Temp: 97.5 F (36.4 C)  Resp: 18   Filed Weights   12/12/15 0918  Weight: 188 lb  11.2 oz (85.594 kg)    GENERAL:alert, no distress and comfortable SKIN: skin color, texture, turgor are normal, no rashes or significant lesions except dry skin on hands, and a few skin cracks on the tip of some fingers. EYES: normal, conjunctiva are pink and non-injected, sclera clear OROPHARYNX:no exudate, no erythema and lips, buccal mucosa, and tongue normal  NECK: supple, thyroid normal size, non-tender, without nodularity LYMPH:  no palpable lymphadenopathy in the cervical, axillary or inguinal LUNGS: clear to auscultation and percussion with normal breathing effort HEART: regular rate & rhythm and no murmurs and no lower extremity edema ABDOMEN:abdomen soft, non-tender and normal bowel sounds.  PSYCH: alert & oriented x 3 with fluent speech.  NEURO: no focal motor/sensory deficits   LABORATORY DATA:  I have reviewed the data as listed CBC  Latest Ref Rng 12/12/2015 10/14/2015 09/18/2015  WBC 4.0 - 10.3 10e3/uL 4.3 4.1 3.9(L)  Hemoglobin 13.0 - 17.1 g/dL 12.3(L) 11.6(L) 11.4(L)  Hematocrit 38.4 - 49.9 % 37.7(L) 36.0(L) 34.6(L)  Platelets 140 - 400 10e3/uL 188 238 219    Recent Labs  09/11/15 0904 09/18/15 1003 10/14/15 1431  NA 137 134* 140  K 4.2 4.4 4.2  CO2 24 23 27   GLUCOSE 98 101 107  BUN 15.6 16.0 13.1  CREATININE 0.8 0.8 0.8  CALCIUM 8.6 8.7 8.9  PROT 5.8* 5.9* 6.2*  ALBUMIN 3.1* 3.2* 3.0*  AST 22 25 18   ALT 18 20 14   ALKPHOS 81 79 100  BILITOT 0.59 0.58 0.36    PATHOLOGY REPORT  Diagnosis 07/16/2015  Rectum, biopsy, mass - INVASIVE ADENOCARCINOMA WITH SIGNET RING CELL FEATURES. - LYMPHOVASCULAR INVASION IS IDENTIFIED. - SEE COMMENT. Microscopic Comment To help evaluate the case, immunohistochemistry stains were performed. The malignant cells are positive for CDX2 and cytokeratin AE1/AE3. There is focal staining for CD56, a neuroendocrine marker. Prostein and S-100 stains are negative. Overall, the features are consistent with invasive adenocarcinoma of  colonic origin, with signet ring cell features. The case was discussed with Dr. Cristina Gong on 07/18/15. (JBK:ds 07/18/15)   RADIOGRAPHIC STUDIES: I have personally reviewed the radiological images as listed and agreed with the findings in the report. No results found.   Flexible  Sigmoidoscopy 07/16/2015 by Dr. Cristina Gong  impression , an infiltrative no obstructing large mass was found in the distal rectum. The mass was partially circumferential (involving one third of the lumen circumference ). The mass measured 5 cm in length.  Biopsy was obtained.  LOWER ENDOSCOPY [place] 08/08/2015  STAGING: T3 N1 Mx by endorectal ultrasound (limited study). ENDOSCOPIC IMPRESSION: Poor prep; unable to do colonoscopy. Rectal mass (adenocarcinoma on biopsies). Staging as above.    ASSESSMENT & PLAN:  82 year old Caucasian male, with past medical history of ulcerative colitis, hypertension and hypothyroidism, who lives in a independent living, presented with bowel habits change in the mild rectal bleeding.  1. Low rectal adenocarcinoma, cT3N1M0, stage IIIB  -he is s/p concurrent chemotherapy and radiation, tolerated very well overall. His symptoms improved after treatment. -He declined surgery due to his advanced age and long recovery from surgery -I discussed that his rectal cancer is unlikely cured by neoadjuvant chemotherapy and radiation alone. He will likely grow again without surgical resection, which can cause obstruction, bleeding and pain. He may need diverging colostomy down the road.  -Rectal stent placement may not be an option due to the distal location -We discussed the option of systemic chemotherapy, likely Xeloda alone given his advanced age for palliative purpose, if he develops symptomatic cancer progression. -He is clinically doing well, we'll continue observation. -Repeat CT scan in 2 months. If the scan looks good, I would recommend to have a complete colonoscopy afterwards.  2. Low back  pain -secondary to spinal stenosis and rectal pain, It has much improved since he started the chemoradiation. -He has Vicodin, but does not take much  3. HTN, AF, arthritis -He will continue follow-up with his primary care physician -will watch his blood pressure and heart rate to closely during the concurrent chemoradiation.  4. Anemia -developed after chemoRT -Improving   Plan -Continue observation and supportive care -I'll see him back in 2 months for follow-up with a restaging CT CAP w contrast   All questions were answered. The patient knows to call the clinic with any problems, questions or concerns.  I spent 25 minutes counseling the patient face to face. The total time spent in the appointment was 30 minutes and more than 50% was on counseling.     [name], MD 12/12/2015 9:35 AM

## 2015-12-12 NOTE — Telephone Encounter (Signed)
Gave and printed appt sched and avs for pt for JUNE °

## 2015-12-13 LAB — CEA (PARALLEL TESTING): CEA: 2.2 ng/mL

## 2015-12-13 LAB — CEA: CEA: 3.3 ng/mL (ref 0.0–4.7)

## 2015-12-30 DIAGNOSIS — N138 Other obstructive and reflux uropathy: Secondary | ICD-10-CM | POA: Diagnosis not present

## 2015-12-30 DIAGNOSIS — Z0000 Encounter for general adult medical examination without abnormal findings: Secondary | ICD-10-CM | POA: Diagnosis not present

## 2015-12-30 DIAGNOSIS — N210 Calculus in bladder: Secondary | ICD-10-CM | POA: Diagnosis not present

## 2015-12-30 DIAGNOSIS — N401 Enlarged prostate with lower urinary tract symptoms: Secondary | ICD-10-CM | POA: Diagnosis not present

## 2016-01-03 DIAGNOSIS — Z6825 Body mass index (BMI) 25.0-25.9, adult: Secondary | ICD-10-CM | POA: Diagnosis not present

## 2016-01-03 DIAGNOSIS — M4310 Spondylolisthesis, site unspecified: Secondary | ICD-10-CM | POA: Diagnosis not present

## 2016-01-03 DIAGNOSIS — M4806 Spinal stenosis, lumbar region: Secondary | ICD-10-CM | POA: Diagnosis not present

## 2016-01-03 DIAGNOSIS — M47816 Spondylosis without myelopathy or radiculopathy, lumbar region: Secondary | ICD-10-CM | POA: Diagnosis not present

## 2016-01-03 DIAGNOSIS — M5136 Other intervertebral disc degeneration, lumbar region: Secondary | ICD-10-CM | POA: Diagnosis not present

## 2016-01-03 DIAGNOSIS — M545 Low back pain: Secondary | ICD-10-CM | POA: Diagnosis not present

## 2016-01-03 DIAGNOSIS — I10 Essential (primary) hypertension: Secondary | ICD-10-CM | POA: Diagnosis not present

## 2016-01-15 ENCOUNTER — Telehealth: Payer: Self-pay | Admitting: Radiation Oncology

## 2016-01-15 ENCOUNTER — Other Ambulatory Visit: Payer: Self-pay | Admitting: Hematology

## 2016-01-15 DIAGNOSIS — C20 Malignant neoplasm of rectum: Secondary | ICD-10-CM

## 2016-01-15 NOTE — Telephone Encounter (Signed)
Left message to move appointment in June to a different time same day, either for 9:30 or 10:30

## 2016-01-21 ENCOUNTER — Emergency Department (HOSPITAL_BASED_OUTPATIENT_CLINIC_OR_DEPARTMENT_OTHER): Admit: 2016-01-21 | Discharge: 2016-01-21 | Disposition: A | Discharge status: Home / Self Care | Payer: Medicare Other

## 2016-01-21 ENCOUNTER — Emergency Department (HOSPITAL_COMMUNITY)
Admission: EM | Admit: 2016-01-21 | Discharge: 2016-01-21 | Disposition: A | Discharge status: Home / Self Care | Payer: Medicare Other | Attending: Emergency Medicine | Admitting: Emergency Medicine

## 2016-01-21 ENCOUNTER — Emergency Department (HOSPITAL_COMMUNITY): Payer: Medicare Other

## 2016-01-21 DIAGNOSIS — M79609 Pain in unspecified limb: Secondary | ICD-10-CM

## 2016-01-21 DIAGNOSIS — M1990 Unspecified osteoarthritis, unspecified site: Secondary | ICD-10-CM | POA: Insufficient documentation

## 2016-01-21 DIAGNOSIS — Z792 Long term (current) use of antibiotics: Secondary | ICD-10-CM | POA: Insufficient documentation

## 2016-01-21 DIAGNOSIS — Z79899 Other long term (current) drug therapy: Secondary | ICD-10-CM | POA: Insufficient documentation

## 2016-01-21 DIAGNOSIS — M7989 Other specified soft tissue disorders: Secondary | ICD-10-CM

## 2016-01-21 DIAGNOSIS — I10 Essential (primary) hypertension: Secondary | ICD-10-CM | POA: Diagnosis not present

## 2016-01-21 DIAGNOSIS — L0390 Cellulitis, unspecified: Secondary | ICD-10-CM | POA: Diagnosis not present

## 2016-01-21 DIAGNOSIS — Z85048 Personal history of other malignant neoplasm of rectum, rectosigmoid junction, and anus: Secondary | ICD-10-CM | POA: Diagnosis not present

## 2016-01-21 DIAGNOSIS — E039 Hypothyroidism, unspecified: Secondary | ICD-10-CM | POA: Diagnosis not present

## 2016-01-21 DIAGNOSIS — L03116 Cellulitis of left lower limb: Secondary | ICD-10-CM

## 2016-01-21 DIAGNOSIS — Z7982 Long term (current) use of aspirin: Secondary | ICD-10-CM | POA: Insufficient documentation

## 2016-01-21 DIAGNOSIS — R509 Fever, unspecified: Secondary | ICD-10-CM | POA: Diagnosis present

## 2016-01-21 DIAGNOSIS — I4891 Unspecified atrial fibrillation: Secondary | ICD-10-CM | POA: Insufficient documentation

## 2016-01-21 DIAGNOSIS — E7800 Pure hypercholesterolemia, unspecified: Secondary | ICD-10-CM | POA: Insufficient documentation

## 2016-01-21 DIAGNOSIS — R259 Unspecified abnormal involuntary movements: Secondary | ICD-10-CM | POA: Diagnosis not present

## 2016-01-21 LAB — CBC WITH DIFFERENTIAL/PLATELET
BASOS ABS: 0 10*3/uL (ref 0.0–0.1)
Basophils Relative: 0 %
EOS PCT: 0 %
Eosinophils Absolute: 0 10*3/uL (ref 0.0–0.7)
HEMATOCRIT: 38.6 % — AB (ref 39.0–52.0)
Hemoglobin: 12.5 g/dL — ABNORMAL LOW (ref 13.0–17.0)
LYMPHS ABS: 0.7 10*3/uL (ref 0.7–4.0)
LYMPHS PCT: 6 %
MCH: 30.9 pg (ref 26.0–34.0)
MCHC: 32.4 g/dL (ref 30.0–36.0)
MCV: 95.5 fL (ref 78.0–100.0)
MONO ABS: 0.5 10*3/uL (ref 0.1–1.0)
Monocytes Relative: 4 %
NEUTROS ABS: 10.2 10*3/uL — AB (ref 1.7–7.7)
Neutrophils Relative %: 90 %
PLATELETS: 160 10*3/uL (ref 150–400)
RBC: 4.04 MIL/uL — ABNORMAL LOW (ref 4.22–5.81)
RDW: 14.4 % (ref 11.5–15.5)
WBC: 11.4 10*3/uL — ABNORMAL HIGH (ref 4.0–10.5)

## 2016-01-21 LAB — COMPREHENSIVE METABOLIC PANEL
ALT: 23 U/L (ref 17–63)
AST: 46 U/L — AB (ref 15–41)
Albumin: 3.7 g/dL (ref 3.5–5.0)
Alkaline Phosphatase: 75 U/L (ref 38–126)
Anion gap: 9 (ref 5–15)
BILIRUBIN TOTAL: 1.5 mg/dL — AB (ref 0.3–1.2)
BUN: 23 mg/dL — AB (ref 6–20)
CALCIUM: 8.6 mg/dL — AB (ref 8.9–10.3)
CO2: 25 mmol/L (ref 22–32)
CREATININE: 0.93 mg/dL (ref 0.61–1.24)
Chloride: 104 mmol/L (ref 101–111)
GLUCOSE: 120 mg/dL — AB (ref 65–99)
POTASSIUM: 3.5 mmol/L (ref 3.5–5.1)
Sodium: 138 mmol/L (ref 135–145)
TOTAL PROTEIN: 6.7 g/dL (ref 6.5–8.1)

## 2016-01-21 LAB — URINALYSIS, ROUTINE W REFLEX MICROSCOPIC
BILIRUBIN URINE: NEGATIVE
GLUCOSE, UA: NEGATIVE mg/dL
Ketones, ur: NEGATIVE mg/dL
Leukocytes, UA: NEGATIVE
Nitrite: NEGATIVE
PROTEIN: 100 mg/dL — AB
Specific Gravity, Urine: 1.028 (ref 1.005–1.030)
pH: 5 (ref 5.0–8.0)

## 2016-01-21 LAB — URINE MICROSCOPIC-ADD ON

## 2016-01-21 MED ORDER — CLINDAMYCIN PHOSPHATE 600 MG/50ML IV SOLN
600.0000 mg | Freq: Once | INTRAVENOUS | Status: AC
  Administered 2016-01-21: 600 mg via INTRAVENOUS
  Filled 2016-01-21: qty 50

## 2016-01-21 MED ORDER — CLINDAMYCIN HCL 300 MG PO CAPS: 300.0000 mg | ORAL_CAPSULE | Freq: Four times a day (QID) | ORAL | Status: DC

## 2016-01-21 MED ORDER — SODIUM CHLORIDE 0.9 % IV BOLUS (SEPSIS)
1000.0000 mL | Freq: Once | INTRAVENOUS | Status: AC
  Administered 2016-01-21: 1000 mL via INTRAVENOUS

## 2016-01-21 NOTE — Progress Notes (Signed)
VASCULAR LAB PRELIMINARY  PRELIMINARY  PRELIMINARY  PRELIMINARY  Left lower extremity venous duplex completed.     Left:  No evidence of DVT, superficial thrombosis, or Baker's cyst.  Other findings lymph nodes in left groin area.  Gave patient's nurse results.  Janifer Adie, RVT, RDMS 01/21/2016, 12:22 PM

## 2016-01-21 NOTE — ED Notes (Signed)
Per [facility] pt from [facility] , reports sliding from bed to floor, no injury reported. Nurse at the facility reports fever 100.7 orally . Pt alert and oriented x 4. Cancer pt. Left leg weakness normal to baseline . Per [facility] denies coughing , nor urinary symptoms.

## 2016-01-21 NOTE — ED Provider Notes (Signed)
CSN: [id number]     Arrival date & time 01/21/16  1003 History   First MD Initiated Contact with Patient 01/21/16 1015     Chief Complaint  Patient presents with  . Fever     (Consider location/radiation/quality/duration/timing/severity/associated sxs/prior Treatment) Patient is a 82 y.o. male presenting with fever.  Fever Max temp prior to arrival:  100.7 Temp source:  Oral Severity:  Mild Onset quality:  Gradual Duration:  1 day Timing:  Unable to specify Progression:  Unable to specify Chronicity:  New Relieved by:  None tried Worsened by:  Nothing tried Ineffective treatments:  None tried Associated symptoms: rash   Associated symptoms: no chest pain, no chills, no congestion, no cough, no dysuria, no rhinorrhea, no somnolence and no vomiting     Past Medical History  Diagnosis Date  . Thyroid disease   . Hypercholesteremia   . HTN (hypertension)   . Hypothyroidism   . Cancer ([place]) 07/16/15    Rectal Cancer=adenocarcinoma  . Allergy   . Atrial fibrillation ([place])   . Pericarditis   . 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 Kidney stone   . Arthritis   . S/P radiation therapy 08/12/15-09/20/15    rectal ca50.4Gy total dose   Past Surgical History  Procedure Laterality Date  . Shoulder surgery Right 2013    rotator cuff   . Knee surgery Left 73 years ago  . Ankle fracture surgery Left 79 years ago  . Hernia repair  28 years ago  . Mohs surgery    . Eye surgery    . Eus N/A 08/08/2015    Procedure: LOWER ENDOSCOPIC ULTRASOUND (EUS);  Surgeon: Arta Silence, MD;  Location: [facility] ENDOSCOPY;  Service: Endoscopy;  Laterality: N/A;  . Flexible sigmoidoscopy N/A 08/08/2015    Procedure: FLEXIBLE SIGMOIDOSCOPY;  Surgeon: Arta Silence, MD;  Location: [facility] ENDOSCOPY;  Service: Endoscopy;  Laterality: N/A;  poor prep   Family History  Problem Relation Age of Onset  . Stroke Father   . Cancer Mother     spine ,died of parkinson's   Social  History  Substance Use Topics  . Smoking status: Never Smoker   . Smokeless tobacco: Never Used  . Alcohol Use: 0.6 oz/week    1 Glasses of wine per week     Comment: 1 glass of wine per week    Review of Systems  Constitutional: Positive for fever. Negative for chills.  HENT: Negative for congestion and rhinorrhea.   Respiratory: Negative for cough.   Cardiovascular: Positive for leg swelling (left leg ). Negative for chest pain.  Gastrointestinal: Negative for vomiting.  Genitourinary: Negative for dysuria.  Skin: Positive for rash.  All other systems reviewed and are negative.     Allergies  Cinnamon; Ibuprofen; Percocet; Protonix; and Xarelto  Home Medications   Prior to Admission medications   Medication Sig Start Date End Date Taking? Authorizing Provider  aspirin 325 MG tablet Take 325 mg by mouth at bedtime.  06/07/13  Yes Larey Dresser, MD  finasteride (PROSCAR) 5 MG tablet Take 5 mg by mouth daily before breakfast. Dose is taken at bedtime   Yes Historical Provider, MD  levothyroxine (SYNTHROID, LEVOTHROID) 88 MCG tablet Take 88 mcg by mouth at bedtime.  05/04/13  Yes Historical Provider, MD  metoprolol succinate (TOPROL-XL) 25 MG 24 hr tablet Take 0.5 tablets (12.5 mg total) by mouth daily. 08/21/15  Yes Larey Dresser, MD  Multiple Vitamin (MULTIVITAMIN WITH MINERALS) TABS tablet Take 1 tablet by mouth daily.   Yes Historical Provider, MD  clindamycin (CLEOCIN) 300 MG capsule Take 1 capsule (300 mg total) by mouth 4 (four) times daily. X 7 days 01/21/16   Merrily Pew, MD   BP 130/70 mmHg  Pulse 91  Temp(Src) 98.5 F (36.9 C) (Oral)  Resp 15  SpO2 96% Physical Exam  Constitutional: He is oriented to person, place, and time. He appears well-developed and well-nourished.  HENT:  Head: Normocephalic and atraumatic.  Neck: Normal range of motion.  Cardiovascular: Normal rate and regular rhythm.   Pulmonary/Chest: Effort normal. No respiratory distress. He has  no wheezes. He has no rales.  Abdominal: Soft. He exhibits no distension.  Musculoskeletal: Normal range of motion. He exhibits no edema or tenderness.  Neurological: He is alert and oriented to person, place, and time.  Skin: Skin is warm. Rash (erythematous, warm, tender area to left lower leg) noted. No erythema.  Nursing note and vitals reviewed.   ED Course  Procedures (including critical care time) Labs Review Labs Reviewed  CBC WITH DIFFERENTIAL/PLATELET - Abnormal; Notable for the following:    WBC 11.4 (*)    RBC 4.04 (*)    Hemoglobin 12.5 (*)    HCT 38.6 (*)    Neutro Abs 10.2 (*)    All other components within normal limits  COMPREHENSIVE METABOLIC PANEL - Abnormal; Notable for the following:    Glucose, Bld 120 (*)    BUN 23 (*)    Calcium 8.6 (*)    AST 46 (*)    Total Bilirubin 1.5 (*)    All other components within normal limits  URINALYSIS, ROUTINE W REFLEX MICROSCOPIC (NOT AT [facility]) - Abnormal; Notable for the following:    Color, Urine AMBER (*)    APPearance CLOUDY (*)    Hgb urine dipstick LARGE (*)    Protein, ur 100 (*)    All other components within normal limits  URINE MICROSCOPIC-ADD ON - Abnormal; Notable for the following:    Squamous Epithelial / LPF 0-5 (*)    Bacteria, UA FEW (*)    Casts GRANULAR CAST (*)    All other components within normal limits    Imaging Review Dg Chest 2 View  01/21/2016  CLINICAL DATA:  Fever.  Rectal carcinoma. EXAM: CHEST  2 VIEW COMPARISON:  05/22/2013 FINDINGS: Mild cardiomegaly remains stable. Mild scarring in left lung base is unchanged. No evidence of acute infiltrate or edema. No evidence of pneumothorax or pleural effusion. IMPRESSION: Stable mild cardiomegaly and left basilar scarring. No active lung disease. Electronically Signed   By: Earle Gell M.D.   On: 01/21/2016 11:41   I have personally reviewed and evaluated these images and lab results as part of my medical decision-making.   EKG  Interpretation None      MDM   Final diagnoses:  Cellulitis of left lower extremity     Fever and mechanical fall from sliding off bed. Has cellulitis on LLE. No e/o sepsis. Will start clindamycin with pcp follow up.   New Prescriptions: Discharge Medication List as of 01/21/2016  1:15 PM    START taking these medications   Details  clindamycin (CLEOCIN) 300 MG capsule Take 1 capsule (300 mg total) by mouth 4 (four) times daily. X 7 days, Starting 01/21/2016, Until Discontinued, Print         I have personally and contemperaneously reviewed labs and imaging and used in  my decision making as above.   A medical screening exam was performed and I feel the patient has had an appropriate workup for their chief complaint at this time and likelihood of emergent condition existing is low and thus workup can continue on an outpatient basis.. Their vital signs are stable. They have been counseled on decision, discharge, follow up and which symptoms necessitate immediate return to the emergency department.  They verbally stated understanding and agreement with plan and discharged in stable condition.      Merrily Pew, MD 01/22/16 [phone]

## 2016-01-21 NOTE — ED Notes (Signed)
Attempted to call report to [facility], left voice message for nurse to call back.

## 2016-01-21 NOTE — ED Notes (Signed)
Call made to [facility] for transport.

## 2016-01-21 NOTE — ED Notes (Signed)
Bed: [id number] Expected date:  Expected time:  Means of arrival:  Comments: EMS - 93/Fall/Fever

## 2016-01-22 DIAGNOSIS — N210 Calculus in bladder: Secondary | ICD-10-CM | POA: Diagnosis not present

## 2016-01-24 DIAGNOSIS — R609 Edema, unspecified: Secondary | ICD-10-CM | POA: Diagnosis not present

## 2016-01-24 DIAGNOSIS — L0390 Cellulitis, unspecified: Secondary | ICD-10-CM | POA: Diagnosis not present

## 2016-01-27 ENCOUNTER — Other Ambulatory Visit: Payer: Self-pay | Admitting: Urology

## 2016-01-27 DIAGNOSIS — L0390 Cellulitis, unspecified: Secondary | ICD-10-CM | POA: Diagnosis not present

## 2016-01-30 DIAGNOSIS — R609 Edema, unspecified: Secondary | ICD-10-CM | POA: Diagnosis not present

## 2016-02-04 ENCOUNTER — Telehealth: Payer: Self-pay | Admitting: *Deleted

## 2016-02-04 NOTE — Telephone Encounter (Signed)
.    Oncology Nurse Navigator Documentation  Navigator Location: [facility] Onc (02/04/16 0935) Navigator Encounter Type: Telephone (02/04/16 0935) Telephone: Incoming Call;Appt Confirmation/Clarification (02/04/16 0935)  VM from patient that he will not be able to make his 6/8 office visit with Dr. Burr Medico at [phone] due to a procedure he is having done the day prior at 46 noon. Feels he would be more able to come on 6/8 in pm or the following day. Will forward message to collaborative nurse to review with MD and contact patient.

## 2016-02-06 ENCOUNTER — Telehealth: Payer: Self-pay | Admitting: *Deleted

## 2016-02-06 ENCOUNTER — Other Ambulatory Visit (HOSPITAL_BASED_OUTPATIENT_CLINIC_OR_DEPARTMENT_OTHER): Payer: Medicare Other

## 2016-02-06 DIAGNOSIS — C20 Malignant neoplasm of rectum: Secondary | ICD-10-CM

## 2016-02-06 LAB — COMPREHENSIVE METABOLIC PANEL
ALT: 15 U/L (ref 0–55)
ANION GAP: 9 meq/L (ref 3–11)
AST: 16 U/L (ref 5–34)
Albumin: 2.9 g/dL — ABNORMAL LOW (ref 3.5–5.0)
Alkaline Phosphatase: 109 U/L (ref 40–150)
BILIRUBIN TOTAL: 0.41 mg/dL (ref 0.20–1.20)
BUN: 15.7 mg/dL (ref 7.0–26.0)
CHLORIDE: 103 meq/L (ref 98–109)
CO2: 26 mEq/L (ref 22–29)
CREATININE: 0.8 mg/dL (ref 0.7–1.3)
Calcium: 9.2 mg/dL (ref 8.4–10.4)
EGFR: 75 mL/min/{1.73_m2} — ABNORMAL LOW (ref >90)
Glucose: 114 mg/dl (ref 70–140)
Potassium: 4.4 mEq/L (ref 3.5–5.1)
Sodium: 139 mEq/L (ref 136–145)
TOTAL PROTEIN: 6.4 g/dL (ref 6.4–8.3)

## 2016-02-06 LAB — CBC WITH DIFFERENTIAL/PLATELET
BASO%: 1.2 % (ref 0.0–2.0)
Basophils Absolute: 0.1 10*3/uL (ref 0.0–0.1)
EOS%: 1 % (ref 0.0–7.0)
Eosinophils Absolute: 0 10*3/uL (ref 0.0–0.5)
HCT: 36.4 % — ABNORMAL LOW (ref 38.4–49.9)
HGB: 11.9 g/dL — ABNORMAL LOW (ref 13.0–17.1)
LYMPH#: 0.5 10*3/uL — AB (ref 0.9–3.3)
LYMPH%: 11.5 % — AB (ref 14.0–49.0)
MCH: 30.5 pg (ref 27.2–33.4)
MCHC: 32.8 g/dL (ref 32.0–36.0)
MCV: 93.2 fL (ref 79.3–98.0)
MONO#: 0.6 10*3/uL (ref 0.1–0.9)
MONO%: 14.3 % — ABNORMAL HIGH (ref 0.0–14.0)
NEUT%: 72 % (ref 39.0–75.0)
NEUTROS ABS: 3 10*3/uL (ref 1.5–6.5)
PLATELETS: 342 10*3/uL (ref 140–400)
RBC: 3.91 10*6/uL — AB (ref 4.20–5.82)
RDW: 14.5 % (ref 11.0–14.6)
WBC: 4.2 10*3/uL (ref 4.0–10.3)

## 2016-02-06 NOTE — Telephone Encounter (Signed)
2nd call from patient requesting to change his 6/8 OV with Dr. Burr Medico to later that afternoon or on 02/14/16. Having a procedure on 6/7 and does not feel he will be up to coming in so early on 6/8. His visit was to discuss CT scan results. Called patient back and made him aware that his message was forwarded to nurse on 5/30 to review schedule w/MD.

## 2016-02-06 NOTE — Telephone Encounter (Signed)
Spoke with pt and informed pt that office visit on 6/8 with new time of  230 pm.   Pt voiced understanding.

## 2016-02-07 ENCOUNTER — Encounter (HOSPITAL_BASED_OUTPATIENT_CLINIC_OR_DEPARTMENT_OTHER): Payer: Self-pay | Admitting: *Deleted

## 2016-02-07 ENCOUNTER — Ambulatory Visit (HOSPITAL_COMMUNITY)
Admission: RE | Admit: 2016-02-07 | Discharge: 2016-02-07 | Disposition: A | Discharge status: Home / Self Care | Payer: Medicare Other | Source: Ambulatory Visit | Attending: Hematology | Admitting: Hematology

## 2016-02-07 ENCOUNTER — Encounter (HOSPITAL_COMMUNITY): Payer: Self-pay

## 2016-02-07 DIAGNOSIS — R195 Other fecal abnormalities: Secondary | ICD-10-CM | POA: Insufficient documentation

## 2016-02-07 DIAGNOSIS — N201 Calculus of ureter: Secondary | ICD-10-CM | POA: Diagnosis not present

## 2016-02-07 DIAGNOSIS — I2510 Atherosclerotic heart disease of native coronary artery without angina pectoris: Secondary | ICD-10-CM | POA: Diagnosis not present

## 2016-02-07 DIAGNOSIS — M1612 Unilateral primary osteoarthritis, left hip: Secondary | ICD-10-CM | POA: Diagnosis not present

## 2016-02-07 DIAGNOSIS — C20 Malignant neoplasm of rectum: Secondary | ICD-10-CM | POA: Diagnosis not present

## 2016-02-07 DIAGNOSIS — I700 Atherosclerosis of aorta: Secondary | ICD-10-CM | POA: Diagnosis not present

## 2016-02-07 DIAGNOSIS — N210 Calculus in bladder: Secondary | ICD-10-CM | POA: Diagnosis not present

## 2016-02-07 DIAGNOSIS — K6289 Other specified diseases of anus and rectum: Secondary | ICD-10-CM | POA: Diagnosis not present

## 2016-02-07 MED ORDER — IOPAMIDOL (ISOVUE-300) INJECTION 61%
100.0000 mL | Freq: Once | INTRAVENOUS | Status: AC | PRN
  Administered 2016-02-07: 100 mL via INTRAVENOUS

## 2016-02-07 NOTE — Progress Notes (Addendum)
NPO AFTER MN.  ARRIVE AT 1030.  CURRENT LAB RESULTS , EKG, AND CT IN CHART AND EPIC.  WILL TAKE AM MEDS W/ SIPS OF WATER DOS.   REVIEWED CHART W/ DR Gifford Shave MDA, OK TO PROCEED.

## 2016-02-12 ENCOUNTER — Encounter (HOSPITAL_BASED_OUTPATIENT_CLINIC_OR_DEPARTMENT_OTHER)
Admission: RE | Disposition: A | Discharge status: Home / Self Care | Payer: Self-pay | Source: Ambulatory Visit | Attending: Urology

## 2016-02-12 ENCOUNTER — Encounter (HOSPITAL_BASED_OUTPATIENT_CLINIC_OR_DEPARTMENT_OTHER): Payer: Self-pay | Admitting: *Deleted

## 2016-02-12 ENCOUNTER — Ambulatory Visit (HOSPITAL_BASED_OUTPATIENT_CLINIC_OR_DEPARTMENT_OTHER): Payer: Medicare Other | Admitting: Anesthesiology

## 2016-02-12 ENCOUNTER — Ambulatory Visit (HOSPITAL_BASED_OUTPATIENT_CLINIC_OR_DEPARTMENT_OTHER)
Admission: RE | Admit: 2016-02-12 | Discharge: 2016-02-12 | Disposition: A | Discharge status: Home / Self Care | Payer: Medicare Other | Source: Ambulatory Visit | Attending: Urology | Admitting: Urology

## 2016-02-12 DIAGNOSIS — N211 Calculus in urethra: Secondary | ICD-10-CM | POA: Insufficient documentation

## 2016-02-12 DIAGNOSIS — M1990 Unspecified osteoarthritis, unspecified site: Secondary | ICD-10-CM | POA: Diagnosis not present

## 2016-02-12 DIAGNOSIS — I10 Essential (primary) hypertension: Secondary | ICD-10-CM | POA: Diagnosis not present

## 2016-02-12 DIAGNOSIS — Z923 Personal history of irradiation: Secondary | ICD-10-CM | POA: Insufficient documentation

## 2016-02-12 DIAGNOSIS — C20 Malignant neoplasm of rectum: Secondary | ICD-10-CM | POA: Diagnosis not present

## 2016-02-12 DIAGNOSIS — R3912 Poor urinary stream: Secondary | ICD-10-CM | POA: Insufficient documentation

## 2016-02-12 DIAGNOSIS — N210 Calculus in bladder: Secondary | ICD-10-CM | POA: Diagnosis not present

## 2016-02-12 DIAGNOSIS — R339 Retention of urine, unspecified: Secondary | ICD-10-CM | POA: Insufficient documentation

## 2016-02-12 DIAGNOSIS — N401 Enlarged prostate with lower urinary tract symptoms: Secondary | ICD-10-CM | POA: Insufficient documentation

## 2016-02-12 DIAGNOSIS — I480 Paroxysmal atrial fibrillation: Secondary | ICD-10-CM | POA: Diagnosis not present

## 2016-02-12 DIAGNOSIS — I2510 Atherosclerotic heart disease of native coronary artery without angina pectoris: Secondary | ICD-10-CM | POA: Diagnosis not present

## 2016-02-12 HISTORY — DX: Personal history of other diseases of the circulatory system: Z86.79

## 2016-02-12 HISTORY — DX: Left anterior fascicular block: I44.4

## 2016-02-12 HISTORY — DX: Paroxysmal atrial fibrillation: I48.0

## 2016-02-12 HISTORY — DX: Unspecified right bundle-branch block: I45.10

## 2016-02-12 HISTORY — DX: Adverse effect of antineoplastic and immunosuppressive drugs, initial encounter: D64.81

## 2016-02-12 HISTORY — DX: Other chronic pain: G89.29

## 2016-02-12 HISTORY — PX: CYSTOSCOPY WITH LITHOLAPAXY: SHX1425

## 2016-02-12 HISTORY — DX: Atherosclerotic heart disease of native coronary artery without angina pectoris: I25.10

## 2016-02-12 HISTORY — DX: Other specified postprocedural states: Z85.820

## 2016-02-12 HISTORY — DX: Low back pain, unspecified: M54.50

## 2016-02-12 HISTORY — DX: Localized edema: R60.0

## 2016-02-12 HISTORY — PX: HOLMIUM LASER APPLICATION: SHX5852

## 2016-02-12 HISTORY — DX: Presence of other specified devices: Z97.8

## 2016-02-12 HISTORY — DX: Calculus in urethra: N21.1

## 2016-02-12 HISTORY — DX: Personal history of adenomatous and serrated colon polyps: Z86.0101

## 2016-02-12 HISTORY — DX: Calculus in bladder: N21.0

## 2016-02-12 HISTORY — DX: Male erectile dysfunction, unspecified: N52.9

## 2016-02-12 HISTORY — DX: Personal history of diseases of the skin and subcutaneous tissue: Z87.2

## 2016-02-12 HISTORY — DX: Low back pain: M54.5

## 2016-02-12 HISTORY — DX: Personal history of colonic polyps: Z86.010

## 2016-02-12 HISTORY — DX: Malignant neoplasm of rectum: C20

## 2016-02-12 HISTORY — DX: Personal history of other diseases of the digestive system: Z87.19

## 2016-02-12 HISTORY — DX: Spinal stenosis, site unspecified: M48.00

## 2016-02-12 HISTORY — DX: Benign prostatic hyperplasia without lower urinary tract symptoms: N40.0

## 2016-02-12 HISTORY — DX: Constipation, unspecified: K59.00

## 2016-02-12 HISTORY — DX: Personal history of other diseases of the nervous system and sense organs: Z86.69

## 2016-02-12 HISTORY — DX: Other specified postprocedural states: Z98.890

## 2016-02-12 HISTORY — DX: Other specified postprocedural states: Z85.828

## 2016-02-12 HISTORY — DX: Adverse effect of antineoplastic and immunosuppressive drugs, initial encounter: T45.1X5A

## 2016-02-12 HISTORY — DX: Unspecified osteoarthritis, unspecified site: M19.90

## 2016-02-12 HISTORY — DX: Presence of urogenital implants: Z96.0

## 2016-02-12 LAB — POCT I-STAT, CHEM 8
BUN: 11 mg/dL (ref 6–20)
CHLORIDE: 103 mmol/L (ref 101–111)
CREATININE: 0.7 mg/dL (ref 0.61–1.24)
Calcium, Ion: 1.21 mmol/L (ref 1.13–1.30)
Glucose, Bld: 95 mg/dL (ref 65–99)
HEMATOCRIT: 36 % — AB (ref 39.0–52.0)
HEMOGLOBIN: 12.2 g/dL — AB (ref 13.0–17.0)
POTASSIUM: 3.9 mmol/L (ref 3.5–5.1)
Sodium: 140 mmol/L (ref 135–145)
TCO2: 25 mmol/L (ref 0–100)

## 2016-02-12 SURGERY — CYSTOSCOPY, WITH BLADDER CALCULUS LITHOLAPAXY
Anesthesia: General

## 2016-02-12 MED ORDER — SODIUM CHLORIDE 0.9 % IR SOLN
Status: DC | PRN
  Administered 2016-02-12 (×4): 6000 mL via INTRAVESICAL
  Administered 2016-02-12: 3000 mL via INTRAVESICAL
  Administered 2016-02-12: 6000 mL via INTRAVESICAL

## 2016-02-12 MED ORDER — FENTANYL CITRATE (PF) 100 MCG/2ML IJ SOLN
25.0000 ug | INTRAMUSCULAR | Status: DC | PRN
  Administered 2016-02-12 (×4): 25 ug via INTRAVENOUS
  Filled 2016-02-12: qty 1

## 2016-02-12 MED ORDER — LIDOCAINE 2% (20 MG/ML) 5 ML SYRINGE
INTRAMUSCULAR | Status: DC | PRN
  Administered 2016-02-12: 60 mg via INTRAVENOUS

## 2016-02-12 MED ORDER — ONDANSETRON HCL 4 MG/2ML IJ SOLN
INTRAMUSCULAR | Status: AC
  Filled 2016-02-12: qty 2

## 2016-02-12 MED ORDER — DEXTROSE 5 % IV SOLN
400.0000 mg | INTRAVENOUS | Status: AC
  Administered 2016-02-12: 400 mg via INTRAVENOUS
  Filled 2016-02-12 (×2): qty 10

## 2016-02-12 MED ORDER — PHENYLEPHRINE HCL 10 MG/ML IJ SOLN
INTRAMUSCULAR | Status: DC | PRN
  Administered 2016-02-12: 80 ug via INTRAVENOUS
  Administered 2016-02-12 (×3): 120 ug via INTRAVENOUS

## 2016-02-12 MED ORDER — LACTATED RINGERS IV SOLN
INTRAVENOUS | Status: DC
  Administered 2016-02-12: 15:00:00 via INTRAVENOUS
  Filled 2016-02-12: qty 1000

## 2016-02-12 MED ORDER — PROPOFOL 10 MG/ML IV BOLUS
INTRAVENOUS | Status: DC | PRN
  Administered 2016-02-12: 40 mg via INTRAVENOUS
  Administered 2016-02-12: 120 mg via INTRAVENOUS

## 2016-02-12 MED ORDER — LIDOCAINE HCL (CARDIAC) 20 MG/ML IV SOLN
INTRAVENOUS | Status: AC
  Filled 2016-02-12: qty 20

## 2016-02-12 MED ORDER — ONDANSETRON HCL 4 MG/2ML IJ SOLN
INTRAMUSCULAR | Status: DC | PRN
  Administered 2016-02-12: 4 mg via INTRAVENOUS

## 2016-02-12 MED ORDER — FENTANYL CITRATE (PF) 100 MCG/2ML IJ SOLN
INTRAMUSCULAR | Status: AC
  Filled 2016-02-12: qty 2

## 2016-02-12 MED ORDER — TRAMADOL HCL 50 MG PO TABS: 50.0000 mg | ORAL_TABLET | Freq: Four times a day (QID) | ORAL | Status: DC | PRN

## 2016-02-12 MED ORDER — EPHEDRINE SULFATE 50 MG/ML IJ SOLN
INTRAMUSCULAR | Status: DC | PRN
  Administered 2016-02-12: 15 mg via INTRAVENOUS
  Administered 2016-02-12: 10 mg via INTRAVENOUS

## 2016-02-12 MED ORDER — FENTANYL CITRATE (PF) 100 MCG/2ML IJ SOLN
INTRAMUSCULAR | Status: DC | PRN
  Administered 2016-02-12 (×2): 25 ug via INTRAVENOUS
  Administered 2016-02-12: 50 ug via INTRAVENOUS

## 2016-02-12 MED ORDER — LACTATED RINGERS IV SOLN
INTRAVENOUS | Status: DC | PRN
  Administered 2016-02-12: 12:00:00 via INTRAVENOUS

## 2016-02-12 SURGICAL SUPPLY — 43 items
BAG DRAIN URO-CYSTO SKYTR STRL (DRAIN) ×3 IMPLANT
BAG DRN UROCATH (DRAIN) ×1
BASKET LASER NITINOL 1.9FR (BASKET) IMPLANT
BASKET STNLS GEMINI 4WIRE 3FR (BASKET) IMPLANT
BASKET ZERO TIP NITINOL 2.4FR (BASKET) IMPLANT
BSKT STON RTRVL 120 1.9FR (BASKET)
BSKT STON RTRVL GEM 120X11 3FR (BASKET)
BSKT STON RTRVL ZERO TP 2.4FR (BASKET)
CANISTER SUCT LVC 12 LTR MEDI- (MISCELLANEOUS) ×3 IMPLANT
CATH INTERMIT  6FR 70CM (CATHETERS) ×3 IMPLANT
CATH ROBINSON RED A/P 14FR (CATHETERS) IMPLANT
CATH URET 5FR 28IN CONE TIP (BALLOONS)
CATH URET 5FR 28IN OPEN ENDED (CATHETERS) IMPLANT
CATH URET 5FR 70CM CONE TIP (BALLOONS) IMPLANT
CLOTH BEACON ORANGE TIMEOUT ST (SAFETY) ×3 IMPLANT
ELECT REM PT RETURN 9FT ADLT (ELECTROSURGICAL)
ELECTRODE REM PT RTRN 9FT ADLT (ELECTROSURGICAL) ×1 IMPLANT
FIBER LASER FLEXIVA 365 (UROLOGICAL SUPPLIES) ×2 IMPLANT
FIBER LASER TRAC TIP (UROLOGICAL SUPPLIES) IMPLANT
GLOVE BIO SURGEON STRL SZ7.5 (GLOVE) ×3 IMPLANT
GOWN STRL REUS W/ TWL LRG LVL3 (GOWN DISPOSABLE) ×3 IMPLANT
GOWN STRL REUS W/ TWL XL LVL3 (GOWN DISPOSABLE) ×1 IMPLANT
GOWN STRL REUS W/TWL LRG LVL3 (GOWN DISPOSABLE) ×9
GOWN STRL REUS W/TWL XL LVL3 (GOWN DISPOSABLE) ×3
GUIDEWIRE 0.038 PTFE COATED (WIRE) IMPLANT
GUIDEWIRE ANG ZIPWIRE 038X150 (WIRE) IMPLANT
GUIDEWIRE STR DUAL SENSOR (WIRE) ×3 IMPLANT
IV NS IRRIG 3000ML ARTHROMATIC (IV SOLUTION) ×24 IMPLANT
KIT BALLIN UROMAX 15FX10 (LABEL) IMPLANT
KIT BALLN UROMAX 15FX4 (MISCELLANEOUS) IMPLANT
KIT BALLN UROMAX 26 75X4 (MISCELLANEOUS)
KIT ROOM TURNOVER WOR (KITS) ×3 IMPLANT
LOOP CUT BIPOLAR 24F LRG (ELECTROSURGICAL) ×2 IMPLANT
MANIFOLD NEPTUNE II (INSTRUMENTS) ×2 IMPLANT
NDL HYPO 18GX1.5 BLUNT FILL (NEEDLE) IMPLANT
NEEDLE HYPO 18GX1.5 BLUNT FILL (NEEDLE) IMPLANT
PACK CYSTO (CUSTOM PROCEDURE TRAY) ×3 IMPLANT
SET HIGH PRES BAL DIL (LABEL)
SYR 20CC LL (SYRINGE) IMPLANT
SYRINGE IRR TOOMEY STRL 70CC (SYRINGE) ×2 IMPLANT
TUBE CONNECTING 12'X1/4 (SUCTIONS) ×1
TUBE CONNECTING 12X1/4 (SUCTIONS) ×1 IMPLANT
WATER STERILE IRR 3000ML UROMA (IV SOLUTION) ×1 IMPLANT

## 2016-02-12 NOTE — Transfer of Care (Signed)
Immediate Anesthesia Transfer of Care Note  Patient: Nathaniel [name]  Procedure(s) Performed: Procedure(s): CYSTOSCOPY WITH LITHOLAPAXY (N/A) HOLMIUM LASER APPLICATION (N/A)  Patient Location: PACU  Anesthesia Type:General  Level of Consciousness: awake, alert , oriented and patient cooperative  Airway & Oxygen Therapy: Patient Spontanous Breathing and Patient connected to nasal cannula oxygen  Post-op Assessment: Report given to RN and Post -op Vital signs reviewed and stable  Post vital signs: Reviewed and stable  Last Vitals:  Filed Vitals:   02/12/16 1118  BP: 140/58  Pulse: 73  Temp: 36.5 C  Resp: 16    Last Pain: There were no vitals filed for this visit.    Patients Stated Pain Goal: 4 ([id number])  Complications: No apparent anesthesia complications

## 2016-02-12 NOTE — H&P (Signed)
Nathaniel [name] is an 82 y.o. male.    Chief Complaint: Pre-op Cystolithalopexy.   HPI:   1 - Prostatic Hypertrophy with Weak Stream - ON finasteride for many years for h/o symptomatic prostatic hypertrophy, now well controlled. Prostate vol 31mL by imaging 2017 on finasteride.     2 - Bladder Stones - s/p cystolithalopexy 2006 by [facility] per report. CT 2017 for rectal CA staging with moderate bladder stone burden and prostate vol 66mL. Had episode of urethra migration prompting office cysto 01/2016.     3 -Prostate Screening - Discontinued 2006 at age 70, PSA at time 2.6.     PMH sig for paroxysmal AFib (no blood thinners), ortho surgeries, rectal CA (Xeloda / XRT 2017). He is resident of independent part of [facility], which is very much enjoys.   His PCP is [name] MD with [facility].     Today " [name]" is seen to proceed with cystolithalopexy for bladder stones which are now symptomatic. Most recent UCX negative.   Past Medical History  Diagnosis Date  . Hypercholesteremia   . HTN (hypertension)   . Hypothyroidism   . S/P radiation therapy 08/12/15-09/20/15    rectal ca50.4Gy total dose  . PAF (paroxysmal atrial fibrillation) [facility]) cardiologist-  dr dalton Aundra Dubin    dx 09/ 2014  in setting of acute pericarditis   . Spinal stenosis   . History of melanoma excision     temple right side/ multiple  . History of basal cell carcinoma excision     multiple  . BPH (benign prostatic hyperplasia)   . ED (erectile dysfunction)   . Incomplete right bundle branch block   . LAFB (left anterior fascicular block)   . History of adenomatous polyp of colon     2007  . History of pericarditis     acute episode 09/ 2014  per cardiologist note , dr Aundra Dubin , related possibly to xarelto use  . Anemia due to antineoplastic chemotherapy   . History of ulcerative colitis     remote  . Atherosclerosis of coronary artery cardiologist-  dr Aundra Dubin    aorta and coronary arteries moderate  per ct 02-07-2016  . History of obstructive sleep apnea     per pt cured by hypnosis  . Constipation   . Foley catheter in place   . Calculi, urethra   . Bladder calculi   . OA (osteoarthritis)   . History of cellulitis     left lower leg-- resolved  . Edema of lower extremity   . Rectal cancer [facility]) dx 07-16-2015  oncologist-  dr [name]    Stage IIIB (cT3, N1, M0)-- concurrent  chemotherapy started 08-12-2015/  radiation complete (08-12-2015 to 09-20-2015)  . Chronic low back pain     Past Surgical History  Procedure Laterality Date  . Mohs surgery  x9  last one 2014   . Eus N/A 08/08/2015    Procedure: LOWER ENDOSCOPIC ULTRASOUND (EUS);  Surgeon: Arta Silence, MD;  Location: [facility] ENDOSCOPY;  Service: Endoscopy;  Laterality: N/A;  . Flexible sigmoidoscopy N/A 08/08/2015    Procedure: FLEXIBLE SIGMOIDOSCOPY;  Surgeon: Arta Silence, MD;  Location: [facility] ENDOSCOPY;  Service: Endoscopy;  Laterality: N/A;  poor prep  . Orif left ankle fx  1935  . Left knee surgery  1940  . Inguinal hernia repair Left 1965  . Rotator cuff repair Bilateral right 2013/  left 2010  . Cataract extraction w/ intraocular lens implant Bilateral 10/ 2016  . Transthoracic  echocardiogram  06-06-2013    ef 55-60%/  mild AR/  mild dilated aortic root/  mild LAE and RAE/  mild to moderate TR/  small pericardial effusion identified    Family History  Problem Relation Age of Onset  . Stroke Father   . Cancer Mother     spine ,died of parkinson's   Social History:  reports that he has never smoked. He has never used smokeless tobacco. He reports that he drinks about 0.6 oz of alcohol per week. He reports that he does not use illicit drugs.  Allergies:  Allergies  Allergen Reactions  . Cinnamon Itching  . Ibuprofen Diarrhea  . Percocet [Oxycodone-Acetaminophen] Other (See Comments)    confusion  . Protonix [Pantoprazole Sodium] Diarrhea and Nausea And Vomiting  . Xarelto [Rivaroxaban] Other (See Comments)     Diarrhea, dehydration    No prescriptions prior to admission    No results found for this or any previous visit (from the past 48 hour(s)). No results found.  ROS  Height 6' (1.829 m), weight 85.276 kg (188 lb). Physical Exam   Assessment/Plan   1 - Prostatic Hypertrophy with Weak Stream - doing well on finasteride for >10 years, continue.     2 - Bladder Stones - recurrent by imaging and now sith symptomatic recurrence. Proceed as planned today with cystolithalopexy, possible channel TURP pending intra-op anatomy. Risks, benefits, alternatives, and peri-op course including likely discharge with foley in place discussed again.     Alexis Frock, MD 02/12/2016, 8:31 AM

## 2016-02-12 NOTE — Brief Op Note (Signed)
02/12/2016  2:24 PM  PATIENT:  Nathaniel [name]  82 y.o. male  PRE-OPERATIVE DIAGNOSIS:  BLADDER STONES  POST-OPERATIVE DIAGNOSIS:  BLADDER STONES  PROCEDURE:  Procedure(s): CYSTOSCOPY WITH LITHOLAPAXY (N/A) HOLMIUM LASER APPLICATION (N/A)  SURGEON:  Surgeon(s) and Role:    * Alexis Frock, MD - Primary  PHYSICIAN ASSISTANT:   ASSISTANTS: none   ANESTHESIA:   general  EBL:  Total I/O In: 750 [I.V.:750] Out: 5 [Blood:5]  BLOOD ADMINISTERED:none  DRAINS: 69F 3 way hematuria catheter, irrigation port plugged   LOCAL MEDICATIONS USED:  NONE  SPECIMEN:  Source of Specimen:  bladder stone fragments  DISPOSITION OF SPECIMEN:  Given to pt  COUNTS:  YES  TOURNIQUET:  * No tourniquets in log *  DICTATION: .Other Dictation: Dictation Number [id number]  PLAN OF CARE: Discharge to home after PACU  PATIENT DISPOSITION:  PACU - hemodynamically stable.   Delay start of Pharmacological VTE agent (>24hrs) due to surgical blood loss or risk of bleeding: yes

## 2016-02-12 NOTE — Anesthesia Postprocedure Evaluation (Signed)
Anesthesia Post Note  Patient: Nathaniel [name]  Procedure(s) Performed: Procedure(s) (LRB): CYSTOSCOPY WITH LITHOLAPAXY (N/A) HOLMIUM LASER APPLICATION (N/A)  Patient location during evaluation: PACU Anesthesia Type: General Level of consciousness: awake and alert Pain management: pain level controlled Vital Signs Assessment: post-procedure vital signs reviewed and stable Respiratory status: spontaneous breathing, nonlabored ventilation, respiratory function stable and patient connected to nasal cannula oxygen Cardiovascular status: blood pressure returned to baseline and stable Postop Assessment: no signs of nausea or vomiting Anesthetic complications: no    Last Vitals:  Filed Vitals:   02/12/16 1515 02/12/16 1530  BP: 143/74 157/66  Pulse: 67 69  Temp:    Resp: 13 17    Last Pain:  Filed Vitals:   02/12/16 1536  PainSc: 3                  Cynthya Yam L

## 2016-02-12 NOTE — Anesthesia Preprocedure Evaluation (Signed)
Anesthesia Evaluation  Patient identified by MRN, date of birth, ID band Patient awake    Reviewed: Allergy & Precautions, H&P , NPO status , Patient's Chart, lab work & pertinent test results  Airway Mallampati: II  TM Distance: >3 FB Neck ROM: Full    Dental no notable dental hx. (+) Dental Advisory Given, Teeth Intact   Pulmonary neg pulmonary ROS,    Pulmonary exam normal breath sounds clear to auscultation       Cardiovascular hypertension, On Medications and Pt. on home beta blockers Normal cardiovascular exam+ dysrhythmias Atrial Fibrillation  Rhythm:Regular Rate:Normal  LAFB. Moderate CAD by CT   Neuro/Psych negative neurological ROS  negative psych ROS   GI/Hepatic Neg liver ROS, Rectal cancer   Endo/Other  negative endocrine ROSHypothyroidism   Renal/GU negative Renal ROS  negative genitourinary   Musculoskeletal negative musculoskeletal ROS (+)   Abdominal   Peds negative pediatric ROS (+)  Hematology negative hematology ROS (+)   Anesthesia Other Findings   Reproductive/Obstetrics negative OB ROS                             Anesthesia Physical Anesthesia Plan  ASA: III  Anesthesia Plan: General   Post-op Pain Management:    Induction: Intravenous  Airway Management Planned: LMA  Additional Equipment:   Intra-op Plan:   Post-operative Plan:   Informed Consent:   Plan Discussed with:   Anesthesia Plan Comments:         Anesthesia Quick Evaluation

## 2016-02-12 NOTE — Anesthesia Procedure Notes (Signed)
Procedure Name: LMA Insertion Date/Time: 02/12/2016 12:41 PM Performed by: Wanita Chamberlain Pre-anesthesia Checklist: Patient identified, Timeout performed, Emergency Drugs available, Suction available and Patient being monitored Patient Re-evaluated:Patient Re-evaluated prior to inductionOxygen Delivery Method: Circle system utilized Preoxygenation: Pre-oxygenation with 100% oxygen Intubation Type: IV induction Ventilation: Mask ventilation without difficulty LMA: LMA inserted LMA Size: 5.0 Number of attempts: 1 Placement Confirmation: positive ETCO2 and breath sounds checked- equal and bilateral Tube secured with: Tape Dental Injury: Teeth and Oropharynx as per pre-operative assessment

## 2016-02-12 NOTE — Discharge Instructions (Signed)
1 - You may have urinary urgency (bladder spasms) and bloody urine on / off x few days. This is normal.  2 - Call MD or go to ER for fever >102, severe pain / nausea / vomiting not relieved by medications, or acute change in medical status  Post Anesthesia Home Care Instructions  Activity: Get plenty of rest for the remainder of the day. A responsible adult should stay with you for 24 hours following the procedure.  For the next 24 hours, DO NOT: -Drive a car -Paediatric nurse -Drink alcoholic beverages -Take any medication unless instructed by your physician -Make any legal decisions or sign important papers.  Meals: Start with liquid foods such as gelatin or soup. Progress to regular foods as tolerated. Avoid greasy, spicy, heavy foods. If nausea and/or vomiting occur, drink only clear liquids until the nausea and/or vomiting subsides. Call your physician if vomiting continues.  Special Instructions/Symptoms: Your throat may feel dry or sore from the anesthesia or the breathing tube placed in your throat during surgery. If this causes discomfort, gargle with warm salt water. The discomfort should disappear within 24 hours.  If you had a scopolamine patch placed behind your ear for the management of post- operative nausea and/or vomiting:  1. The medication in the patch is effective for 72 hours, after which it should be removed.  Wrap patch in a tissue and discard in the trash. Wash hands thoroughly with soap and water. 2. You may remove the patch earlier than 72 hours if you experience unpleasant side effects which may include dry mouth, dizziness or visual disturbances. 3. Avoid touching the patch. Wash your hands with soap and water after contact with the patch.   Foley Catheter Care, Adult A Foley catheter is a soft, flexible tube that is placed into the bladder to drain urine. A Foley catheter may be inserted if:  You leak urine or are not able to control when you urinate  (urinary incontinence).  You are not able to urinate when you need to (urinary retention).  You had prostate surgery or surgery on the genitals.  You have certain medical conditions, such as multiple sclerosis, dementia, or a spinal cord injury. If you are going home with a Foley catheter in place, follow the instructions below. TAKING CARE OF THE CATHETER 1. Wash your hands with soap and water. 2. Using mild soap and warm water on a clean washcloth:  Clean the area on your body closest to the catheter insertion site using a circular motion, moving away from the catheter. Never wipe toward the catheter because this could sweep bacteria up into the urethra and cause infection.  Remove all traces of soap. Pat the area dry with a clean towel. For males, reposition the foreskin. 3. Attach the catheter to your leg so there is no tension on the catheter. Use adhesive tape or a leg strap. If you are using adhesive tape, remove any sticky residue left behind by the previous tape you used. 4. Keep the drainage bag below the level of the bladder, but keep it off the floor. 5. Check throughout the day to be sure the catheter is working and urine is draining freely. Make sure the tubing does not become kinked. 6. Do not pull on the catheter or try to remove it. Pulling could damage internal tissues. TAKING CARE OF THE DRAINAGE BAGS You will be given two drainage bags to take home. One is a large overnight drainage bag, and the other is  a smaller leg bag that fits underneath clothing. You may wear the overnight bag at any time, but you should never wear the smaller leg bag at night. Follow the instructions below for how to empty, change, and clean your drainage bags. Emptying the Drainage Bag You must empty your drainage bag when it is  - full or at least 2-3 times a day. 1. Wash your hands with soap and water. 2. Keep the drainage bag below your hips, below the level of your bladder. This stops urine  from going back into the tubing and into your bladder. 3. Hold the dirty bag over the toilet or a clean container. 4. Open the pour spout at the bottom of the bag and empty the urine into the toilet or container. Do not let the pour spout touch the toilet, container, or any other surface. Doing so can place bacteria on the bag, which can cause an infection. 5. Clean the pour spout with a gauze pad or cotton ball that has rubbing alcohol on it. 6. Close the pour spout. 7. Attach the bag to your leg with adhesive tape or a leg strap. 8. Wash your hands well. Changing the Drainage Bag Change your drainage bag once a month or sooner if it starts to smell bad or look dirty. Below are steps to follow when changing the drainage bag. 1. Wash your hands with soap and water. 2. Pinch off the rubber catheter so that urine does not spill out. 3. Disconnect the catheter tube from the drainage tube at the connection valve. Do not let the tubes touch any surface. 4. Clean the end of the catheter tube with an alcohol wipe. Use a different alcohol wipe to clean the end of the drainage tube. 5. Connect the catheter tube to the drainage tube of the clean drainage bag. 6. Attach the new bag to the leg with adhesive tape or a leg strap. Avoid attaching the new bag too tightly. 7. Wash your hands well. Cleaning the Drainage Bag 1. Wash your hands with soap and water. 2. Wash the bag in warm, soapy water. 3. Rinse the bag thoroughly with warm water. 4. Fill the bag with a solution of white vinegar and water (1 cup vinegar to 1 qt warm water [.2 L vinegar to 1 L warm water]). Close the bag and soak it for 30 minutes in the solution. 5. Rinse the bag with warm water. 6. Hang the bag to dry with the pour spout open and hanging downward. 7. Store the clean bag (once it is dry) in a clean plastic bag. 8. Wash your hands well. PREVENTING INFECTION  Wash your hands before and after handling your catheter.  Take  showers daily and wash the area where the catheter enters your body. Do not take baths. Replace wet leg straps with dry ones, if this applies.  Do not use powders, sprays, or lotions on the genital area. Only use creams, lotions, or ointments as directed by your caregiver.  For females, wipe from front to back after each bowel movement.  Drink enough fluids to keep your urine clear or pale yellow unless you have a fluid restriction.  Do not let the drainage bag or tubing touch or lie on the floor.  Wear cotton underwear to absorb moisture and to keep your skin drier. SEEK MEDICAL CARE IF:   Your urine is cloudy or smells unusually bad.  Your catheter becomes clogged.  You are not draining urine into the bag  or your bladder feels full.  Your catheter starts to leak. SEEK IMMEDIATE MEDICAL CARE IF:   You have pain, swelling, redness, or pus where the catheter enters the body.  You have pain in the abdomen, legs, lower back, or bladder.  You have a fever.  You see blood fill the catheter, or your urine is pink or red.  You have nausea, vomiting, or chills.  Your catheter gets pulled out. MAKE SURE YOU:   Understand these instructions.  Will watch your condition.  Will get help right away if you are not doing well or get worse.   This information is not intended to replace advice given to you by your health care provider. Make sure you discuss any questions you have with your health care provider.   Document Released: 08/24/2005 Document Revised: 01/08/2014 Document Reviewed: 08/15/2012 Elsevier Interactive Patient Education [employer].

## 2016-02-13 ENCOUNTER — Ambulatory Visit (HOSPITAL_BASED_OUTPATIENT_CLINIC_OR_DEPARTMENT_OTHER): Payer: Medicare Other | Admitting: Hematology

## 2016-02-13 ENCOUNTER — Telehealth: Payer: Self-pay | Admitting: Hematology

## 2016-02-13 ENCOUNTER — Encounter (HOSPITAL_BASED_OUTPATIENT_CLINIC_OR_DEPARTMENT_OTHER): Payer: Self-pay | Admitting: Urology

## 2016-02-13 VITALS — BP 165/73 | HR 94 | Temp 98.4°F | Resp 18 | Ht 72.0 in | Wt 190.7 lb

## 2016-02-13 DIAGNOSIS — I4891 Unspecified atrial fibrillation: Secondary | ICD-10-CM

## 2016-02-13 DIAGNOSIS — Z85828 Personal history of other malignant neoplasm of skin: Secondary | ICD-10-CM

## 2016-02-13 DIAGNOSIS — D6481 Anemia due to antineoplastic chemotherapy: Secondary | ICD-10-CM

## 2016-02-13 DIAGNOSIS — Z85820 Personal history of malignant melanoma of skin: Secondary | ICD-10-CM | POA: Diagnosis not present

## 2016-02-13 DIAGNOSIS — C20 Malignant neoplasm of rectum: Secondary | ICD-10-CM

## 2016-02-13 DIAGNOSIS — T451X5A Adverse effect of antineoplastic and immunosuppressive drugs, initial encounter: Secondary | ICD-10-CM

## 2016-02-13 DIAGNOSIS — I480 Paroxysmal atrial fibrillation: Secondary | ICD-10-CM

## 2016-02-13 DIAGNOSIS — I10 Essential (primary) hypertension: Secondary | ICD-10-CM | POA: Diagnosis not present

## 2016-02-13 NOTE — Telephone Encounter (Signed)
Gave and printed appt sched and avs fo rpt for OCT °

## 2016-02-13 NOTE — Op Note (Signed)
NAME:  Nathaniel [name], SELLARS NO.:  [id number]  MEDICAL RECORD NO.:  [id number]  LOCATION:                                 FACILITY:  PHYSICIAN:  Alexis Frock, MD     DATE OF BIRTH:  02-Apr-1920  DATE OF PROCEDURE: 02/12/2016                               OPERATIVE REPORT  DIAGNOSIS:  Large volume bladder and urethral stones with urinary retention.  PROCEDURES: 1. Cystolitholapaxy of stone greater than 2.5 cm. 2. Fulguration of bleeders.  ESTIMATED BLOOD LOSS:  Nil.  COMPLICATION:  None.  SPECIMEN:  Bladder stone fragments given to the patient.  FINDINGS: 1. Very large volume bladder stones, estimated probably 4-5 cm2.     These were composed of innumerable small stones, each of which 8     mm. 2. Complete removal of all bladder stone material following laser     lithotripsy. 3. Minor bladder neck oozing following cystolitholapaxy necessitating     fulguration of bleeders. 4. Excellent hemostasis following fulguration of bleeders. 5. Successful placement of a new 22-French 3-way Foley catheter, 10 mL     of sterile water in the balloon, irrigation port plugged.  This was     connected to straight drain.  INDICATION:  Nathaniel [name] is a pleasant 82 year old gentleman with history of rectal cancer, prostatic hypertrophy, he is done very well symptom wise from a prostatic hypertrophy on finasteride for number of years.  He has known to have had some bladder stones for quite some time.  He unfortunately developed acute urinary retention due to the column of bladder stones occupying his proximal urethra, this was managed in the short term with a Foley catheter.  He now presents for cystolitholapaxy for removal of these stones.  Informed consent was obtained and placed in the medical record.  PROCEDURE IN DETAIL:  The patient being Nathaniel [name], was verified. Procedure being cystolitholapaxy was confirmed.  Procedure was carried out.  Time-out was performed.   Intravenous antibiotics were administered.  General LMA anesthesia was introduced.  The patient was placed into a low-lithotomy position, taken exquisite care to position his left leg as it is from chronic left leg edema and limited range of motion of his left hip and knee.  His in-situ catheter was removed and sterile field was created by prepping and draping the patient's penis, perineum, and proximal thighs using iodine x3.  Next, cystourethroscopy was performed using a 26-French rigid resectoscope sheath with visual obturator.  Inspection of the anterior and posterior urethra revealed some moderate prostatic hypertrophy.  This was not severe at this point. Inspection of the urinary bladder revealed a mildly trabeculated bladder with innumerable bladder stones, each of which was approximately 8-10 mm in diameter.  This may total stone volume approximately 4-5 cm2. Unfortunately, these stones were of a size that could not simply be irrigated to the resectoscope sheath.  As such, a holmium laser was used 550 nanometer fiber stabilized with the open-ended catheter via an endoscopic laser bridge with saline irrigation and very careful systematic litholapaxy was performed using settings of 1.5 joules and 10 hertz fragmenting these stones into pieces that were 3-4 mm in diameter, taking exquisite care to  avoid any energy application directly to the ureteral orifice to avoid any bladder perforation, which did not occur grossly.  These bladder fragments were irrigated and set aside to be given to the patient.  During multiple rounds of irrigation of fragments, he did develop some mucosal oozing at the area of the bladder neck.  It was felt that fulguration of this would be clearly warranted. As such, a resectoscope loop was used to provide fulguration currents to the area of the bladder neck at mostly the 6 o'clock to 3 o'clock positions, which resulted in excellent hemostasis.  Following  these maneuvers, there was excellent hemostasis, complete resolution of all bladder stone fragments.  Ureteral orifices were identified and uninjured.  There was no evidence of bladder perforation with panendoscopic examination of the bladder.  Given the large volume stones, prolonged procedure was felt that interval catheterization would be warranted.  As such, a new 22-French 3-way Foley catheter was placed per urethra to straight drain, a 10 mL of sterile water in the balloon, irrigation port was plugged.  The procedure was terminated.  The patient tolerated the procedure well.  There were no immediate periprocedural complications.  The patient was taken to the postanesthesia care unit in stable condition.    ______________________________ Alexis Frock, MD   ______________________________ Alexis Frock, MD    TM/MEDQ  D:  02/12/2016  T:  02/13/2016  Job:  [id number]

## 2016-02-13 NOTE — Progress Notes (Signed)
[place]  [phone] [phone]  Clinic follow up Note   Patient Care Team: [name], MD as PCP - General (Family Medicine) Ronald Lobo, MD as Consulting Physician (Gastroenterology) Tania Ade, RN as Registered Nurse 02/13/2016   CHIEF COMPLAINTS:  Follow up rectal cancer  Oncology History    Rectal cancer [facility])   Staging form: Colon and Rectum, AJCC 7th Edition     Clinical stage from 08/08/2015: Stage IIIB (T3, N1, M0) - Unsigned       Rectal cancer ([place])   07/16/2015 Pathology Results Biopsy positive for invasive adenocarcinoma with signet ring cell features   07/23/2015 Initial Diagnosis Rectal cancer ([place])   08/12/2015 Concurrent Chemotherapy Xeloda 1500mg  q12h on the day of radiation    08/12/2015 - 09/20/2015 Radiation Therapy radiaiton to rectal cancer      HISTORY OF PRESENTING ILLNESS:  Nathaniel [name] 82 y.o. male is here because of recently diagnosed rectal cancer. He presents to the clinic by himself.  He has had abnormal bowel movement for the past 2-3 months. It's usually very small amount, loose, every time he urinates, he has a such a small bowel movement. He does not really have a good normal bowel movement. He also reports frequent stool stains on his underwear. He noticed small amount blood in his stool recently, which prompt his seeing GI Dr. Cristina Gong on. He underwent a flexible sigmoidoscopy in the office, which showed a rectal mass, biopsy reviewed adenocarcinoma. He denies significant rectal pain, abdominal discomfort, or other new complaints.  He lives in a independent living facility, he is able to take care of his all ADLs, and does some light housework, shopping, by himself. He uses a walker, still drives. He does get fatigued after activity, his energy level has seemed to be decreasing in over the past few years. No significant change daily. He has decent appetite and eats well.  He had remote history  of ulcerative colitis, which has been in remission. 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  He is widowed, has 5 children, who all live in out states. He does have several stepchildren from his secondary to, and some of them live in [place].  CURRENT TREATMENT: observation   INTERIM HISTORY Nathaniel [name] returns for follow-up. He is doing well overall. He had 11 renal stones removed yesterday, he tolerated the prodedure very well. He has been doing well overall lately. His bowel movement is normal most of time, no hematochezia or melena. He denies any significant abdominal discomfort, bloating, or nausea. He has good appetite and a decent energy level. He is ambulatory with a walker, not very physically active, but is able to function well overall.    MEDICAL HISTORY:  Past Medical History  Diagnosis Date  . Hypercholesteremia   . HTN (hypertension)   . Hypothyroidism   . S/P radiation therapy 08/12/15-09/20/15    rectal ca50.4Gy total dose  . PAF (paroxysmal atrial fibrillation) [facility]) cardiologist-  dr dalton Aundra Dubin    dx 09/ 2014  in setting of acute pericarditis   . Spinal stenosis   . History of melanoma excision     temple right side/ multiple  . History of basal cell carcinoma excision     multiple  . BPH (benign prostatic hyperplasia)   . ED (erectile dysfunction)   . Incomplete right bundle branch block   . LAFB (left anterior fascicular block)   . History  of adenomatous polyp of colon     2007  . History of pericarditis     acute episode 09/ 2014  per cardiologist note , dr Aundra Dubin , related possibly to xarelto use  . Anemia due to antineoplastic chemotherapy   . History of ulcerative colitis     remote  . Atherosclerosis of coronary artery cardiologist-  dr Aundra Dubin    aorta and coronary arteries moderate per ct 02-07-2016  . History of obstructive sleep apnea     per pt cured by hypnosis  . Constipation   .  Foley catheter in place   . Calculi, urethra   . Bladder calculi   . OA (osteoarthritis)   . History of cellulitis     left lower leg-- resolved  . Edema of lower extremity   . Rectal cancer [facility]) dx 07-16-2015  oncologist-  dr [name]    Stage IIIB (cT3, N1, M0)-- concurrent  chemotherapy started 08-12-2015/  radiation complete (08-12-2015 to 09-20-2015)  . Chronic low back pain     SURGICAL HISTORY: Past Surgical History  Procedure Laterality Date  . Mohs surgery  x9  last one 2014   . Eus N/A 08/08/2015    Procedure: LOWER ENDOSCOPIC ULTRASOUND (EUS);  Surgeon: Arta Silence, MD;  Location: [facility] ENDOSCOPY;  Service: Endoscopy;  Laterality: N/A;  . Flexible sigmoidoscopy N/A 08/08/2015    Procedure: FLEXIBLE SIGMOIDOSCOPY;  Surgeon: Arta Silence, MD;  Location: [facility] ENDOSCOPY;  Service: Endoscopy;  Laterality: N/A;  poor prep  . Orif left ankle fx  1935  . Left knee surgery  1940  . Inguinal hernia repair Left 1965  . Rotator cuff repair Bilateral right 2013/  left 2010  . Cataract extraction w/ intraocular lens implant Bilateral 10/ 2016  . Transthoracic echocardiogram  06-06-2013    ef 55-60%/  mild AR/  mild dilated aortic root/  mild LAE and RAE/  mild to moderate TR/  small pericardial effusion identified  . Cystoscopy with litholapaxy N/A 02/12/2016    Procedure: CYSTOSCOPY WITH LITHOLAPAXY;  Surgeon: Alexis Frock, MD;  Location: [facility];  Service: Urology;  Laterality: N/A;  . Holmium laser application N/A [id number]    Procedure: HOLMIUM LASER APPLICATION;  Surgeon: Alexis Frock, MD;  Location: [facility];  Service: Urology;  Laterality: N/A;    SOCIAL HISTORY: Social History   Social History  . Marital Status: Widowed     Spouse Name: N/A  . Number of Children: 4 daughters and one son, and 5 stepchildren   . Years of Education: N/A   Occupational History  . 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  Smoking status: Never Smoker   . Smokeless tobacco: Not on file  . Alcohol Use: 0.6 oz/week    1 Glasses of wine per week     Comment: 1 glass of wine  . Drug Use: No  . Sexual Activity: Not on file   Other Topics Concern  . Not on file   Social History Narrative    FAMILY HISTORY: Family History  Problem Relation Age of Onset  . Stroke Father   . Cancer Mother     spine ,died of parkinson's    ALLERGIES:  is allergic to cinnamon; ibuprofen; percocet; protonix; and xarelto.  MEDICATIONS:  Current Outpatient Prescriptions  Medication Sig Dispense Refill  . aspirin EC 325 MG tablet Take 325 mg by mouth every evening.    Marland Kitchen  cephALEXin (KEFLEX) 500 MG capsule Take 1 capsule by mouth 2 (two) times daily.  0  . clindamycin (CLEOCIN) 300 MG capsule Take 300 mg by mouth 4 (four) times daily.  0  . finasteride (PROSCAR) 5 MG tablet Take 5 mg by mouth every evening. Dose is taken at bedtime    . levothyroxine (SYNTHROID, LEVOTHROID) 88 MCG tablet Take 88 mcg by mouth daily before breakfast.     . metoprolol succinate (TOPROL-XL) 25 MG 24 hr tablet Take 0.5 tablets (12.5 mg total) by mouth daily. (Patient taking differently: Take 12.5 mg by mouth every morning. ) 45 tablet 2  . Multiple Vitamin (MULTIVITAMIN WITH MINERALS) TABS tablet Take 1 tablet by mouth daily.    . polyethylene glycol (MIRALAX / GLYCOLAX) packet Take 17 g by mouth daily as needed.    . traMADol (ULTRAM) 50 MG tablet Take 1 tablet (50 mg total) by mouth every 6 (six) hours as needed for moderate pain. Post-operatively 30 tablet 0   No current facility-administered medications for this visit.    REVIEW OF SYSTEMS:   Constitutional: Denies fevers, chills or abnormal night sweats Eyes: Denies blurriness of vision, double vision or watery eyes Ears, nose, mouth, throat, and face: Denies mucositis or sore throat Respiratory: Denies cough, dyspnea or wheezes Cardiovascular: Denies palpitation, chest discomfort or lower  extremity swelling Gastrointestinal:  Denies nausea, heartburn or change in bowel habits Skin: Denies abnormal skin rashes Lymphatics: Denies new lymphadenopathy or easy bruising Neurological:Denies numbness, tingling or new weaknesses Behavioral/Psych: Mood is stable, no new changes  All other systems were reviewed with the patient and are negative.  PHYSICAL EXAMINATION: ECOG PERFORMANCE STATUS: 2  Filed Vitals:   02/13/16 1500  BP: 165/73  Pulse: 94  Temp: 98.4 F (36.9 C)  Resp: 18   Filed Weights   02/13/16 1500  Weight: 190 lb 11.2 oz (86.501 kg)    GENERAL:alert, no distress and comfortable SKIN: skin color, texture, turgor are normal, no rashes or significant lesions except dry skin on hands, and a few skin cracks on the tip of some fingers. EYES: normal, conjunctiva are pink and non-injected, sclera clear OROPHARYNX:no exudate, no erythema and lips, buccal mucosa, and tongue normal  NECK: supple, thyroid normal size, non-tender, without nodularity LYMPH:  no palpable lymphadenopathy in the cervical, axillary or inguinal LUNGS: clear to auscultation and percussion with normal breathing effort HEART: regular rate & rhythm and no murmurs and no lower extremity edema ABDOMEN:abdomen soft, non-tender and normal bowel sounds.  PSYCH: alert & oriented x 3 with fluent speech.  NEURO: no focal motor/sensory deficits   LABORATORY DATA:  I have reviewed the data as listed CBC Latest Ref Rng 02/12/2016 02/06/2016 01/21/2016  WBC 4.0 - 10.3 10e3/uL - 4.2 11.4(H)  Hemoglobin 13.0 - 17.0 g/dL 12.2(L) 11.9(L) 12.5(L)  Hematocrit 39.0 - 52.0 % 36.0(L) 36.4(L) 38.6(L)  Platelets 140 - 400 10e3/uL - 342 160    Recent Labs  12/12/15 0859 01/21/16 1131 02/06/16 0853 02/12/16 1131  NA 142 138 139 140  K 4.1 3.5 4.4 3.9  CL  --  104  --  103  CO2 28 25 26   --   GLUCOSE 98 120* 114 95  BUN 15.2 23* 15.7 11  CREATININE 0.8 0.93 0.8 0.70  CALCIUM 9.0 8.6* 9.2  --   GFRNONAA  --   >60  --   --   GFRAA  --  >60  --   --   PROT 6.5 6.7  6.4  --   ALBUMIN 3.2* 3.7 2.9*  --   AST 17 46* 16  --   ALT 13 23 15   --   ALKPHOS 105 75 109  --   BILITOT 0.44 1.5* 0.41  --    Results for PIKE, AVELLA (MRN [id number]) as of 02/13/2016 08:16  Ref. Range 08/15/2015 10:32 09/11/2015 09:04 12/12/2015 08:59  CEA Latest Units: ng/mL 2.9 2.1 2.2   PATHOLOGY REPORT  Diagnosis 07/16/2015  Rectum, biopsy, mass - INVASIVE ADENOCARCINOMA WITH SIGNET RING CELL FEATURES. - LYMPHOVASCULAR INVASION IS IDENTIFIED. - SEE COMMENT. Microscopic Comment To help evaluate the case, immunohistochemistry stains were performed. The malignant cells are positive for CDX2 and cytokeratin AE1/AE3. There is focal staining for CD56, a neuroendocrine marker. Prostein and S-100 stains are negative. Overall, the features are consistent with invasive adenocarcinoma of colonic origin, with signet ring cell features. The case was discussed with Dr. Cristina Gong on 07/18/15. (JBK:ds 07/18/15)   RADIOGRAPHIC STUDIES: I have personally reviewed the radiological images as listed and agreed with the findings in the report. Dg Chest 2 View  01/21/2016  CLINICAL DATA:  Fever.  Rectal carcinoma. EXAM: CHEST  2 VIEW COMPARISON:  05/22/2013 FINDINGS: Mild cardiomegaly remains stable. Mild scarring in left lung base is unchanged. No evidence of acute infiltrate or edema. No evidence of pneumothorax or pleural effusion. IMPRESSION: Stable mild cardiomegaly and left basilar scarring. No active lung disease. Electronically Signed   By: Earle Gell M.D.   On: 01/21/2016 11:41   Ct Chest W Contrast  02/07/2016  CLINICAL DATA:  Rectal cancer diagnosed 7 months ago. Chemotherapy and radiation therapy completed. EXAM: CT CHEST, ABDOMEN, AND PELVIS WITH CONTRAST TECHNIQUE: Multidetector CT imaging of the chest, abdomen and pelvis was performed following the standard protocol during bolus administration of intravenous contrast. CONTRAST:   13mL ISOVUE-300 IOPAMIDOL (ISOVUE-300) INJECTION 61% COMPARISON:  PET-CT 08/06/2015. FINDINGS: CT CHEST Mediastinum/Nodes: There are no enlarged mediastinal, hilar or axillary lymph nodes. The thyroid gland, trachea and esophagus demonstrate no significant findings. The heart is mildly enlarged. There is atherosclerosis of the aorta, great vessels and coronary arteries. Lungs/Pleura: There is no pleural effusion. There is stable mild scarring at both lung bases and at the left lung apex. No suspicious pulmonary nodules. Musculoskeletal/Chest wall: No chest wall mass or suspicious osseous findings. CT ABDOMEN AND PELVIS FINDINGS Hepatobiliary: There is a stable cyst medially in the right hepatic lobe on image 62. No suspicious hepatic findings. No evidence of gallstones, gallbladder wall thickening or biliary dilatation. Pancreas: Unremarkable. No pancreatic ductal dilatation or surrounding inflammatory changes. Spleen: Normal in size without focal abnormality. Adrenals/Urinary Tract: Both adrenal glands appear normal. Probable small renal cysts bilaterally. No evidence of upper urinary tract calculus or hydronephrosis. A Foley catheter is in place. There is bladder wall thickening with 5 bladder calculi, decreased in number from the previous examination. However, there are numerous calculi within the urethra, surrounding the Foley catheter (approximately 13). Stomach/Bowel: The stomach and small bowel appear unremarkable. There is a large amount of stool throughout the colon. The appendix appears normal. There are diverticular changes of the sigmoid colon. There is persistent anorectal wall thickening anteriorly and on the right, similar to the prior examination. This corresponds with the area of hypermetabolic activity on PET-CT. Vascular/Lymphatic: There are no enlarged abdominal or pelvic lymph nodes. Small retroperitoneal lymph nodes are stable. There is no pelvic adenopathy. There is atherosclerosis of the  aorta and its branches. No large vessel occlusion  identified. Reproductive: Stable moderate enlargement of the prostate gland. Other: No ascites or peritoneal nodularity. Musculoskeletal: No acute or significant osseous findings. Severe left hip osteoarthritis is again noted. There is lower lumbar spondylosis as well. IMPRESSION: 1. Grossly stable anorectal wall thickening compared with previous study. No adjacent adenopathy or evidence of distant metastatic disease. 2. Multiple bladder calculi are again noted status post Foley catheter placement. In addition, there are multiple calculi within the urethra around the catheter. The prostate gland is moderately enlarged. 3. Moderate stool throughout the colon without evidence of obstruction. 4. Moderate atherosclerosis and severe left hip osteoarthritis again noted. Electronically Signed   By: Richardean Sale M.D.   On: 02/07/2016 13:29   Ct Abdomen Pelvis W Contrast  02/07/2016  CLINICAL DATA:  Rectal cancer diagnosed 7 months ago. Chemotherapy and radiation therapy completed. EXAM: CT CHEST, ABDOMEN, AND PELVIS WITH CONTRAST TECHNIQUE: Multidetector CT imaging of the chest, abdomen and pelvis was performed following the standard protocol during bolus administration of intravenous contrast. CONTRAST:  114mL ISOVUE-300 IOPAMIDOL (ISOVUE-300) INJECTION 61% COMPARISON:  PET-CT 08/06/2015. FINDINGS: CT CHEST Mediastinum/Nodes: There are no enlarged mediastinal, hilar or axillary lymph nodes. The thyroid gland, trachea and esophagus demonstrate no significant findings. The heart is mildly enlarged. There is atherosclerosis of the aorta, great vessels and coronary arteries. Lungs/Pleura: There is no pleural effusion. There is stable mild scarring at both lung bases and at the left lung apex. No suspicious pulmonary nodules. Musculoskeletal/Chest wall: No chest wall mass or suspicious osseous findings. CT ABDOMEN AND PELVIS FINDINGS Hepatobiliary: There is a stable cyst  medially in the right hepatic lobe on image 62. No suspicious hepatic findings. No evidence of gallstones, gallbladder wall thickening or biliary dilatation. Pancreas: Unremarkable. No pancreatic ductal dilatation or surrounding inflammatory changes. Spleen: Normal in size without focal abnormality. Adrenals/Urinary Tract: Both adrenal glands appear normal. Probable small renal cysts bilaterally. No evidence of upper urinary tract calculus or hydronephrosis. A Foley catheter is in place. There is bladder wall thickening with 5 bladder calculi, decreased in number from the previous examination. However, there are numerous calculi within the urethra, surrounding the Foley catheter (approximately 13). Stomach/Bowel: The stomach and small bowel appear unremarkable. There is a large amount of stool throughout the colon. The appendix appears normal. There are diverticular changes of the sigmoid colon. There is persistent anorectal wall thickening anteriorly and on the right, similar to the prior examination. This corresponds with the area of hypermetabolic activity on PET-CT. Vascular/Lymphatic: There are no enlarged abdominal or pelvic lymph nodes. Small retroperitoneal lymph nodes are stable. There is no pelvic adenopathy. There is atherosclerosis of the aorta and its branches. No large vessel occlusion identified. Reproductive: Stable moderate enlargement of the prostate gland. Other: No ascites or peritoneal nodularity. Musculoskeletal: No acute or significant osseous findings. Severe left hip osteoarthritis is again noted. There is lower lumbar spondylosis as well. IMPRESSION: 1. Grossly stable anorectal wall thickening compared with previous study. No adjacent adenopathy or evidence of distant metastatic disease. 2. Multiple bladder calculi are again noted status post Foley catheter placement. In addition, there are multiple calculi within the urethra around the catheter. The prostate gland is moderately enlarged.  3. Moderate stool throughout the colon without evidence of obstruction. 4. Moderate atherosclerosis and severe left hip osteoarthritis again noted. Electronically Signed   By: Richardean Sale M.D.   On: 02/07/2016 13:29     Flexible  Sigmoidoscopy 07/16/2015 by Dr. Cristina Gong  impression , an infiltrative no obstructing large  mass was found in the distal rectum. The mass was partially circumferential (involving one third of the lumen circumference ). The mass measured 5 cm in length.  Biopsy was obtained.  LOWER ENDOSCOPY [place] 08/08/2015  STAGING: T3 N1 Mx by endorectal ultrasound (limited study). ENDOSCOPIC IMPRESSION: Poor prep; unable to do colonoscopy. Rectal mass (adenocarcinoma on biopsies). Staging as above.    ASSESSMENT & PLAN:  82 year old Caucasian male, with past medical history of ulcerative colitis, hypertension and hypothyroidism, who lives in a independent living, presented with bowel habits change and rectal bleeding.  1. Low rectal adenocarcinoma, cT3N1M0, stage IIIB  -he is s/p concurrent chemotherapy and radiation, tolerated very well overall. His symptoms improved after treatment. -He declined surgery due to his advanced age and long recovery from surgery -I discussed that his rectal cancer is unlikely cured by neoadjuvant chemotherapy and radiation alone. He will likely grow again without surgical resection, which can cause obstruction, bleeding and pain. He may need diverging colostomy down the road.  -Rectal stent placement may not be an option due to the distal location -I discussed his restaging CT scan, which showed a stable rectal wall thickening, no metastatic adenopathy or distant metastasis. -I recommend him to have a repeated flexible sigmoidoscopy by Dr. Alycia Rossetti, he will call for appointment. -He is clinically doing well, we'll continue observation.  2. Low back pain -secondary to spinal stenosis and rectal pain, It has much improved lately.  -He has Vicodin,  but does not take much  3. HTN, AF, arthritis -He will continue follow-up with his primary care physician -will watch his blood pressure and heart rate to closely during the concurrent chemoradiation.  4. Anemia -developed after chemoRT -Improving   Plan -Continue observation and supportive care -CT scan results discussed with patient -He will call Dr. Osborn Coho office for a repeated flexible sigmoidoscopy -I'll see him back in 4 months with lab and exam.  All questions were answered. The patient knows to call the clinic with any problems, questions or concerns.  I spent 25 minutes counseling the patient face to face. The total time spent in the appointment was 30 minutes and more than 50% was on counseling.     [name], MD 02/13/2016 3:23 PM

## 2016-02-17 DIAGNOSIS — N210 Calculus in bladder: Secondary | ICD-10-CM | POA: Diagnosis not present

## 2016-02-18 ENCOUNTER — Other Ambulatory Visit: Payer: Self-pay | Admitting: Gastroenterology

## 2016-02-18 DIAGNOSIS — Z85038 Personal history of other malignant neoplasm of large intestine: Secondary | ICD-10-CM | POA: Diagnosis not present

## 2016-02-18 DIAGNOSIS — Z85048 Personal history of other malignant neoplasm of rectum, rectosigmoid junction, and anus: Secondary | ICD-10-CM | POA: Diagnosis not present

## 2016-02-26 ENCOUNTER — Telehealth: Payer: Self-pay | Admitting: *Deleted

## 2016-02-26 NOTE — Telephone Encounter (Signed)
CALLED PATIENT TO ASK ABOUT ALTERING FU ON 03-04-16, APPT. MOVED TO 1 PM ON 03-04-16, PATIENT AGREED TO NEW TIME ON 03-04-16

## 2016-03-04 ENCOUNTER — Ambulatory Visit
Admission: RE | Admit: 2016-03-04 | Discharge: 2016-03-04 | Disposition: A | Discharge status: Home / Self Care | Payer: Medicare Other | Source: Ambulatory Visit | Attending: Radiation Oncology | Admitting: Radiation Oncology

## 2016-03-04 ENCOUNTER — Encounter: Payer: Self-pay | Admitting: Radiation Oncology

## 2016-03-04 ENCOUNTER — Telehealth: Payer: Self-pay | Admitting: *Deleted

## 2016-03-04 VITALS — BP 161/59 | HR 57 | Temp 97.6°F | Ht 72.0 in | Wt 184.0 lb

## 2016-03-04 DIAGNOSIS — C20 Malignant neoplasm of rectum: Secondary | ICD-10-CM

## 2016-03-04 NOTE — Telephone Encounter (Signed)
CALLED PATIENT TO ASK QUESTION, LVM FOR A RETURN CALL 

## 2016-03-04 NOTE — Progress Notes (Signed)
Radiation Oncology         [phone] ________________________________  Name: Nathaniel [name] MRN: [id number]  Date: 03/04/2016  DOB: 29-Jan-1920  Follow-Up Visit Note  CC: Gennette Pac, MD  [name], MD  Diagnosis:   Low rectal adenocarcinoma, cT3N1M0, stage IIIB   Interval Since Last Radiation:  5 months  08/12/15-09/17/15: 50.4 Gy total with 45 Gy to the rectum and a 5.4 Gy boost  Narrative:  The patient returns today for routine follow-up.  In summary this is a pleasant 82 y.o. male who was found to have rectal cancer in 2016 underwent chemotherapy sensitization with Xeloda and radiotherapy which he completed on 09/17/2015. He declined surgical resection his age and comorbidities, and understands that he will likely develop subsequent disease. He was seen by Dr. Burr Medico on 02/16/2016, and at that time did not have any clinical evidence of disease. His most recent CT scan on 02/07/2016  revealed grossly stable in rectal wall thickening without evidence of adenopathy or distant disease. About a week ago he had a sigmoidoscopy with Dr. Shonna Chock which did not reveal concerns for disease.                       On review of systems, the patient reports that he is doing well overall. He denies any chest pain, shortness of breath, cough, fevers, chills, night sweats, unintended weight changes. He has been using colace to help keep his bowel movements more regular, and continues to have urinary incontinence. He denies any  abdominal pain, nausea or vomiting. He denies any new musculoskeletal or joint aches or pains. A complete review of systems is obtained and is otherwise negative.   Past Medical History:  Past Medical History  Diagnosis Date  . Hypercholesteremia   . HTN (hypertension)   . Hypothyroidism   . S/P radiation therapy 08/12/15-09/20/15    rectal ca50.4Gy total dose  . PAF (paroxysmal atrial fibrillation) [facility]) cardiologist-  dr dalton Aundra Dubin    dx 09/ 2014  in setting of  acute pericarditis   . Spinal stenosis   . History of melanoma excision     temple right side/ multiple  . History of basal cell carcinoma excision     multiple  . BPH (benign prostatic hyperplasia)   . ED (erectile dysfunction)   . Incomplete right bundle branch block   . LAFB (left anterior fascicular block)   . History of adenomatous polyp of colon     2007  . History of pericarditis     acute episode 09/ 2014  per cardiologist note , dr Aundra Dubin , related possibly to xarelto use  . Anemia due to antineoplastic chemotherapy   . History of ulcerative colitis     remote  . Atherosclerosis of coronary artery cardiologist-  dr Aundra Dubin    aorta and coronary arteries moderate per ct 02-07-2016  . History of obstructive sleep apnea     per pt cured by hypnosis  . Constipation   . Foley catheter in place   . Calculi, urethra   . Bladder calculi   . OA (osteoarthritis)   . History of cellulitis     left lower leg-- resolved  . Edema of lower extremity   . Rectal cancer [facility]) dx 07-16-2015  oncologist-  dr [name]    Stage IIIB (cT3, N1, M0)-- concurrent  chemotherapy started 08-12-2015/  radiation complete (08-12-2015 to 09-20-2015)  . Chronic low back pain  Past Surgical History: Past Surgical History  Procedure Laterality Date  . Mohs surgery  x9  last one 2014   . Eus N/A 08/08/2015    Procedure: LOWER ENDOSCOPIC ULTRASOUND (EUS);  Surgeon: Arta Silence, MD;  Location: [facility] ENDOSCOPY;  Service: Endoscopy;  Laterality: N/A;  . Flexible sigmoidoscopy N/A 08/08/2015    Procedure: FLEXIBLE SIGMOIDOSCOPY;  Surgeon: Arta Silence, MD;  Location: [facility] ENDOSCOPY;  Service: Endoscopy;  Laterality: N/A;  poor prep  . Orif left ankle fx  1935  . Left knee surgery  1940  . Inguinal hernia repair Left 1965  . Rotator cuff repair Bilateral right 2013/  left 2010  . Cataract extraction w/ intraocular lens implant Bilateral 10/ 2016  . Transthoracic echocardiogram  06-06-2013    ef 55-60%/   mild AR/  mild dilated aortic root/  mild LAE and RAE/  mild to moderate TR/  small pericardial effusion identified  . Cystoscopy with litholapaxy N/A 02/12/2016    Procedure: CYSTOSCOPY WITH LITHOLAPAXY;  Surgeon: Alexis Frock, MD;  Location: [facility];  Service: Urology;  Laterality: N/A;  . Holmium laser application N/A [id number]    Procedure: HOLMIUM LASER APPLICATION;  Surgeon: Alexis Frock, MD;  Location: [facility];  Service: Urology;  Laterality: N/A;    Social History:  Social History   Social History  . Marital Status: Married    Spouse Name: N/A  . Number of Children: N/A  . Years of Education: N/A   Occupational History  . Not on file.   Social History Main Topics  . Smoking status: Never Smoker   . Smokeless tobacco: Never Used  . Alcohol Use: 0.6 oz/week    1 Glasses of wine per week     Comment: 1 glass of wine per week  . Drug Use: No  . Sexual Activity: Not on file   Other Topics Concern  . 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 Stroke Father   . Cancer Mother     spine ,died of parkinson's    ALLERGIES:  is allergic to cinnamon; ibuprofen; percocet; protonix; and xarelto.  Meds: Current Outpatient Prescriptions  Medication Sig Dispense Refill  . aspirin EC 325 MG tablet Take 325 mg by mouth every evening.    . docusate sodium (COLACE) 100 MG capsule Take 100 mg by mouth 2 (two) times daily.    . finasteride (PROSCAR) 5 MG tablet Take 5 mg by mouth every evening. Dose is taken at bedtime    . levothyroxine  (SYNTHROID, LEVOTHROID) 88 MCG tablet Take 88 mcg by mouth daily before breakfast.     . metoprolol succinate (TOPROL-XL) 25 MG 24 hr tablet Take 0.5 tablets (12.5 mg total) by mouth daily. (Patient taking differently: Take 12.5 mg by mouth every morning. ) 45 tablet 2  . Multiple Vitamin (MULTIVITAMIN WITH MINERALS) TABS tablet Take 1 tablet by mouth daily.    . polyethylene glycol (MIRALAX / GLYCOLAX) packet Take 17 g by mouth daily as needed.    . traMADol (ULTRAM) 50  MG tablet Take 1 tablet (50 mg total) by mouth every 6 (six) hours as needed for moderate pain. Post-operatively (Patient not taking: Reported on 03/04/2016) 30 tablet 0   No current facility-administered medications for this encounter.    Physical Findings:  height is 6' (1.829 m) and weight is 184 lb (83.462 kg). His temperature is 97.6 F (36.4 C). His blood pressure is 161/59 and his pulse is 57. His oxygen saturation is 99%. .   In general this is a well appearing Caucasian male in no acute distress. He is alert and oriented x4 and appropriate throughout the examination. HEENT reveals that the patient is normocephalic, atraumatic. EOMs are intact. PERRLA. Skin is intact without any evidence of gross lesions. Cardiovascular exam reveals a regular rate and rhythm, no clicks rubs or murmurs are auscultated. Chest is clear to auscultation bilaterally. Lymphatic assessment is performed and does not reveal any adenopathy in the cervical, supraclavicular, axillary, or inguinal chains. Abdomen has active bowel sounds in all quadrants and is intact. The abdomen is soft, non tender, non distended. Lower extremities are negative for pretibial pitting edema, deep calf tenderness, cyanosis or clubbing. Digital rectal exam is deferred until his next appointment.   Lab Findings: Lab Results  Component Value Date   WBC 4.2 02/06/2016   HGB 12.2* 02/12/2016   HCT 36.0* 02/12/2016   MCV 93.2 02/06/2016   PLT 342 02/06/2016      Radiographic Findings: Ct Chest W Contrast  02/07/2016  CLINICAL DATA:  Rectal cancer diagnosed 7 months ago. Chemotherapy and radiation therapy completed. EXAM: CT CHEST, ABDOMEN, AND PELVIS WITH CONTRAST TECHNIQUE: Multidetector CT imaging of the chest, abdomen and pelvis was performed following the standard protocol during bolus administration of intravenous contrast. CONTRAST:  157mL ISOVUE-300 IOPAMIDOL (ISOVUE-300) INJECTION 61% COMPARISON:  PET-CT 08/06/2015. FINDINGS: CT CHEST Mediastinum/Nodes: There are no enlarged mediastinal, hilar or axillary lymph nodes. The thyroid gland, trachea and esophagus demonstrate no significant findings. The heart is mildly enlarged. There is atherosclerosis of the aorta, great vessels and coronary arteries. Lungs/Pleura: There is no pleural effusion. There is stable mild scarring at both lung bases and at the left lung apex. No suspicious pulmonary nodules. Musculoskeletal/Chest wall: No chest wall mass or suspicious osseous findings. CT ABDOMEN AND PELVIS FINDINGS Hepatobiliary: There is a stable cyst medially in the right hepatic lobe on image 62. No suspicious hepatic findings. No evidence of gallstones, gallbladder wall thickening or biliary dilatation. Pancreas: Unremarkable. No pancreatic ductal dilatation or surrounding inflammatory changes. Spleen: Normal in size without focal abnormality. Adrenals/Urinary Tract: Both adrenal glands appear normal. Probable small renal cysts bilaterally. No evidence of upper urinary tract calculus or hydronephrosis. A Foley catheter is in place. There is bladder wall thickening with 5 bladder calculi, decreased in number from the previous examination. However, there are numerous calculi within the urethra, surrounding the Foley catheter (approximately 13). Stomach/Bowel: The stomach and small bowel appear unremarkable. There is a large amount of stool throughout the colon. The appendix appears normal. There are diverticular  changes of the sigmoid colon. There is persistent anorectal wall thickening anteriorly and on the right, similar to the prior examination. This corresponds with the area of hypermetabolic activity on PET-CT. Vascular/Lymphatic: There are no enlarged abdominal or pelvic lymph nodes. Small retroperitoneal lymph nodes are stable. There is no pelvic adenopathy. There is atherosclerosis of the aorta and its branches. No large vessel occlusion identified. Reproductive: Stable moderate enlargement of the prostate gland. Other: No ascites or peritoneal  nodularity. Musculoskeletal: No acute or significant osseous findings. Severe left hip osteoarthritis is again noted. There is lower lumbar spondylosis as well. IMPRESSION: 1. Grossly stable anorectal wall thickening compared with previous study. No adjacent adenopathy or evidence of distant metastatic disease. 2. Multiple bladder calculi are again noted status post Foley catheter placement. In addition, there are multiple calculi within the urethra around the catheter. The prostate gland is moderately enlarged. 3. Moderate stool throughout the colon without evidence of obstruction. 4. Moderate atherosclerosis and severe left hip osteoarthritis again noted. Electronically Signed   By: Richardean Sale M.D.   On: 02/07/2016 13:29   Ct Abdomen Pelvis W Contrast  02/07/2016  CLINICAL DATA:  Rectal cancer diagnosed 7 months ago. Chemotherapy and radiation therapy completed. EXAM: CT CHEST, ABDOMEN, AND PELVIS WITH CONTRAST TECHNIQUE: Multidetector CT imaging of the chest, abdomen and pelvis was performed following the standard protocol during bolus administration of intravenous contrast. CONTRAST:  159mL ISOVUE-300 IOPAMIDOL (ISOVUE-300) INJECTION 61% COMPARISON:  PET-CT 08/06/2015. FINDINGS: CT CHEST Mediastinum/Nodes: There are no enlarged mediastinal, hilar or axillary lymph nodes. The thyroid gland, trachea and esophagus demonstrate no significant findings. The heart is  mildly enlarged. There is atherosclerosis of the aorta, great vessels and coronary arteries. Lungs/Pleura: There is no pleural effusion. There is stable mild scarring at both lung bases and at the left lung apex. No suspicious pulmonary nodules. Musculoskeletal/Chest wall: No chest wall mass or suspicious osseous findings. CT ABDOMEN AND PELVIS FINDINGS Hepatobiliary: There is a stable cyst medially in the right hepatic lobe on image 62. No suspicious hepatic findings. No evidence of gallstones, gallbladder wall thickening or biliary dilatation. Pancreas: Unremarkable. No pancreatic ductal dilatation or surrounding inflammatory changes. Spleen: Normal in size without focal abnormality. Adrenals/Urinary Tract: Both adrenal glands appear normal. Probable small renal cysts bilaterally. No evidence of upper urinary tract calculus or hydronephrosis. A Foley catheter is in place. There is bladder wall thickening with 5 bladder calculi, decreased in number from the previous examination. However, there are numerous calculi within the urethra, surrounding the Foley catheter (approximately 13). Stomach/Bowel: The stomach and small bowel appear unremarkable. There is a large amount of stool throughout the colon. The appendix appears normal. There are diverticular changes of the sigmoid colon. There is persistent anorectal wall thickening anteriorly and on the right, similar to the prior examination. This corresponds with the area of hypermetabolic activity on PET-CT. Vascular/Lymphatic: There are no enlarged abdominal or pelvic lymph nodes. Small retroperitoneal lymph nodes are stable. There is no pelvic adenopathy. There is atherosclerosis of the aorta and its branches. No large vessel occlusion identified. Reproductive: Stable moderate enlargement of the prostate gland. Other: No ascites or peritoneal nodularity. Musculoskeletal: No acute or significant osseous findings. Severe left hip osteoarthritis is again noted. There  is lower lumbar spondylosis as well. IMPRESSION: 1. Grossly stable anorectal wall thickening compared with previous study. No adjacent adenopathy or evidence of distant metastatic disease. 2. Multiple bladder calculi are again noted status post Foley catheter placement. In addition, there are multiple calculi within the urethra around the catheter. The prostate gland is moderately enlarged. 3. Moderate stool throughout the colon without evidence of obstruction. 4. Moderate atherosclerosis and severe left hip osteoarthritis again noted. Electronically Signed   By: Richardean Sale M.D.   On: 02/07/2016 13:29    Impression/Plan: 1. Low rectal adenocarcinoma, cT3N1M0, stage IIIB. The patient has undergone recent CT in the last two weeks that shows stable wall thickening of the rectum, as well as  direct visualization last week in the form of a sigmoidoscopy. I discussed that given this, we could avoid rectal examination today, however I would propose that he continue to be followed closely every 3 months for the next year and a half, and I will plan to stagger his appointments so that rather than seeing each of his providers every 3 months, he is seen by one of them every 3 months to avoid duplicating his examinations so close together. He is in agreement. He is encouraged to call if he has any questions or concerns prior to his next visit.      Carola Rhine, PAC

## 2016-03-04 NOTE — Progress Notes (Addendum)
Mr. Kuehler returns S/P XRT to his rectum.  He reports soft stools and, at times, hard stool which he manages with Colace. Denies any rectal irritation. Travel by wheelchair. Reports numbness of his 2nd and digits of his right hand.

## 2016-03-05 NOTE — Addendum Note (Signed)
Encounter addended by: Benn Moulder, RN on: 03/05/2016  9:36 AM<BR>     Documentation filed: Charges VN

## 2016-04-15 ENCOUNTER — Other Ambulatory Visit: Payer: Self-pay | Admitting: Cardiology

## 2016-04-29 DIAGNOSIS — H52203 Unspecified astigmatism, bilateral: Secondary | ICD-10-CM | POA: Diagnosis not present

## 2016-04-29 DIAGNOSIS — H02403 Unspecified ptosis of bilateral eyelids: Secondary | ICD-10-CM | POA: Diagnosis not present

## 2016-04-29 DIAGNOSIS — Z961 Presence of intraocular lens: Secondary | ICD-10-CM | POA: Diagnosis not present

## 2016-05-06 DIAGNOSIS — L578 Other skin changes due to chronic exposure to nonionizing radiation: Secondary | ICD-10-CM | POA: Diagnosis not present

## 2016-05-06 DIAGNOSIS — D0362 Melanoma in situ of left upper limb, including shoulder: Secondary | ICD-10-CM | POA: Diagnosis not present

## 2016-05-06 DIAGNOSIS — D225 Melanocytic nevi of trunk: Secondary | ICD-10-CM | POA: Diagnosis not present

## 2016-05-06 DIAGNOSIS — L821 Other seborrheic keratosis: Secondary | ICD-10-CM | POA: Diagnosis not present

## 2016-05-06 DIAGNOSIS — Z86008 Personal history of in-situ neoplasm of other site: Secondary | ICD-10-CM | POA: Diagnosis not present

## 2016-05-06 DIAGNOSIS — D485 Neoplasm of uncertain behavior of skin: Secondary | ICD-10-CM | POA: Diagnosis not present

## 2016-05-06 DIAGNOSIS — D0339 Melanoma in situ of other parts of face: Secondary | ICD-10-CM | POA: Diagnosis not present

## 2016-05-06 DIAGNOSIS — Z85828 Personal history of other malignant neoplasm of skin: Secondary | ICD-10-CM | POA: Diagnosis not present

## 2016-05-06 DIAGNOSIS — Z872 Personal history of diseases of the skin and subcutaneous tissue: Secondary | ICD-10-CM | POA: Diagnosis not present

## 2016-06-04 DIAGNOSIS — D0362 Melanoma in situ of left upper limb, including shoulder: Secondary | ICD-10-CM | POA: Diagnosis not present

## 2016-06-04 DIAGNOSIS — D0339 Melanoma in situ of other parts of face: Secondary | ICD-10-CM | POA: Diagnosis not present

## 2016-06-04 DIAGNOSIS — D485 Neoplasm of uncertain behavior of skin: Secondary | ICD-10-CM | POA: Diagnosis not present

## 2016-06-11 ENCOUNTER — Telehealth: Payer: Self-pay | Admitting: Hematology

## 2016-06-11 ENCOUNTER — Encounter: Payer: Self-pay | Admitting: *Deleted

## 2016-06-11 ENCOUNTER — Encounter: Payer: Self-pay | Admitting: Hematology

## 2016-06-11 ENCOUNTER — Other Ambulatory Visit (HOSPITAL_BASED_OUTPATIENT_CLINIC_OR_DEPARTMENT_OTHER): Payer: Medicare Other

## 2016-06-11 ENCOUNTER — Ambulatory Visit (HOSPITAL_BASED_OUTPATIENT_CLINIC_OR_DEPARTMENT_OTHER): Payer: Medicare Other | Admitting: Hematology

## 2016-06-11 VITALS — BP 184/74 | HR 65 | Temp 97.6°F | Resp 17 | Ht 72.0 in | Wt 190.8 lb

## 2016-06-11 DIAGNOSIS — D6481 Anemia due to antineoplastic chemotherapy: Secondary | ICD-10-CM

## 2016-06-11 DIAGNOSIS — I4891 Unspecified atrial fibrillation: Secondary | ICD-10-CM

## 2016-06-11 DIAGNOSIS — M1990 Unspecified osteoarthritis, unspecified site: Secondary | ICD-10-CM

## 2016-06-11 DIAGNOSIS — C20 Malignant neoplasm of rectum: Secondary | ICD-10-CM

## 2016-06-11 DIAGNOSIS — I10 Essential (primary) hypertension: Secondary | ICD-10-CM

## 2016-06-11 DIAGNOSIS — Z85828 Personal history of other malignant neoplasm of skin: Secondary | ICD-10-CM | POA: Diagnosis not present

## 2016-06-11 DIAGNOSIS — I480 Paroxysmal atrial fibrillation: Secondary | ICD-10-CM

## 2016-06-11 LAB — COMPREHENSIVE METABOLIC PANEL
ALBUMIN: 3.2 g/dL — AB (ref 3.5–5.0)
ALT: 12 U/L (ref 0–55)
ANION GAP: 8 meq/L (ref 3–11)
AST: 16 U/L (ref 5–34)
Alkaline Phosphatase: 130 U/L (ref 40–150)
BILIRUBIN TOTAL: 0.44 mg/dL (ref 0.20–1.20)
BUN: 14.9 mg/dL (ref 7.0–26.0)
CALCIUM: 9.4 mg/dL (ref 8.4–10.4)
CO2: 27 meq/L (ref 22–29)
CREATININE: 0.8 mg/dL (ref 0.7–1.3)
Chloride: 105 mEq/L (ref 98–109)
EGFR: 77 mL/min/{1.73_m2} — ABNORMAL LOW (ref >90)
Glucose: 101 mg/dl (ref 70–140)
Potassium: 4.1 mEq/L (ref 3.5–5.1)
Sodium: 140 mEq/L (ref 136–145)
TOTAL PROTEIN: 6.8 g/dL (ref 6.4–8.3)

## 2016-06-11 LAB — CBC WITH DIFFERENTIAL/PLATELET
BASO%: 0.2 % (ref 0.0–2.0)
Basophils Absolute: 0 10*3/uL (ref 0.0–0.1)
EOS ABS: 0.1 10*3/uL (ref 0.0–0.5)
EOS%: 2.4 % (ref 0.0–7.0)
HEMATOCRIT: 38.2 % — AB (ref 38.4–49.9)
HEMOGLOBIN: 12.6 g/dL — AB (ref 13.0–17.1)
LYMPH#: 0.8 10*3/uL — AB (ref 0.9–3.3)
LYMPH%: 17.5 % (ref 14.0–49.0)
MCH: 30.6 pg (ref 27.2–33.4)
MCHC: 33 g/dL (ref 32.0–36.0)
MCV: 92.7 fL (ref 79.3–98.0)
MONO#: 0.6 10*3/uL (ref 0.1–0.9)
MONO%: 13.5 % (ref 0.0–14.0)
NEUT%: 66.4 % (ref 39.0–75.0)
NEUTROS ABS: 3 10*3/uL (ref 1.5–6.5)
PLATELETS: 219 10*3/uL (ref 140–400)
RBC: 4.12 10*6/uL — ABNORMAL LOW (ref 4.20–5.82)
RDW: 13.9 % (ref 11.0–14.6)
WBC: 4.5 10*3/uL (ref 4.0–10.3)

## 2016-06-11 LAB — CEA (IN HOUSE-CHCC): CEA (CHCC-IN HOUSE): 3.44 ng/mL (ref 0.00–5.00)

## 2016-06-11 NOTE — Progress Notes (Signed)
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 Cristina [name]      02/20/2016 Pathology Results    No adenomatous change or malignancy        HISTORY OF PRESENTING ILLNESS:  Nathaniel [name] 82 y.o. male is here because of recently diagnosed rectal cancer. He presents to the clinic by himself.  He has had abnormal bowel movement for the past 2-3 months. It's usually very small amount, loose, every time he urinates, he has a such a small bowel movement. He does not really have a good normal bowel movement. He also reports frequent stool stains on his underwear. He noticed small amount blood in his stool recently, which prompt his seeing GI Dr. Cristina [name] on. He underwent a flexible sigmoidoscopy in the office, which showed a rectal mass, biopsy reviewed adenocarcinoma. He denies significant rectal pain, abdominal discomfort, or other new complaints.  He lives in a independent living facility, he is able to take care of his all ADLs, and does some light  housework, shopping, by himself. He uses a walker, still drives. He does get fatigued after activity, his energy level has seemed to be decreasing in over the past few years. No significant change daily. He has decent appetite and eats well.  He had remote history of ulcerative colitis, which has been in remission. 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  He is widowed, has 5 children, who all live in out states. He does have several stepchildren from his secondary to, and some of them live in [place].  CURRENT TREATMENT: observation   INTERIM HISTORY Nathaniel [name] returns for follow-up. He is doing well overall. He has intermittent diarrhea and constipation, and urinary incontinancy , no pain or hematochezia. He did have sigmoidoscopy by Dr. Cristina [name] 2-3 months ago, which showed no residual tumor per patient. He has good appetite, energy level is moderate, but lives independently and functions well. He recently had multiple skin melanoma and squamous cell carcinoma surgery, last one a week ago on his right check and nose.   MEDICAL HISTORY:  Past Medical History:  Diagnosis Date  . Anemia due to antineoplastic chemotherapy   . Atherosclerosis of coronary artery cardiologist-  dr Nathaniel [name]   aorta and coronary arteries moderate per ct 02-07-2016  . Bladder calculi   . BPH (benign prostatic hyperplasia)   . Calculi, urethra   . Chronic low  back pain   . Constipation   . ED (erectile dysfunction)   . Edema of lower extremity   . Foley catheter in place   . History of adenomatous polyp of colon    2007  . History of basal cell carcinoma excision    multiple  . History of cellulitis    left lower leg-- resolved  . History of melanoma excision    temple right side/ multiple  . History of obstructive sleep apnea    per pt cured by hypnosis  . History of pericarditis    acute episode 09/ 2014  per cardiologist note , dr Nathaniel [name] ,  related possibly to xarelto use  . History of ulcerative colitis    remote  . HTN (hypertension)   . Hypercholesteremia   . Hypothyroidism   . Incomplete right bundle branch block   . LAFB (left anterior fascicular block)   . OA (osteoarthritis)   . PAF (paroxysmal atrial fibrillation) Nathaniel [name]) cardiologist-  dr dalton Nathaniel [name]   dx 09/ 2014  in setting of acute pericarditis   . Rectal cancer [facility]) dx 07-16-2015  oncologist-  dr Nathaniel [name]   Stage IIIB (cT3, N1, M0)-- concurrent  chemotherapy started 08-12-2015/  radiation complete (08-12-2015 to 09-20-2015)  . S/P radiation therapy 08/12/15-09/20/15   rectal ca50.4Gy total dose  . Spinal stenosis     SURGICAL HISTORY: Past Surgical History:  Procedure Laterality Date  . CATARACT EXTRACTION W/ INTRAOCULAR LENS IMPLANT Bilateral 10/ 2016  . CYSTOSCOPY WITH LITHOLAPAXY N/A 02/12/2016   Procedure: CYSTOSCOPY WITH LITHOLAPAXY;  Surgeon: Nathaniel Frock, MD;  Location: [facility];  Service: Urology;  Laterality: N/A;  . EUS N/A 08/08/2015   Procedure: LOWER ENDOSCOPIC ULTRASOUND (EUS);  Surgeon: Arta Silence, MD;  Location: [facility] ENDOSCOPY;  Service: Endoscopy;  Laterality: N/A;  . FLEXIBLE SIGMOIDOSCOPY N/A 08/08/2015   Procedure: FLEXIBLE SIGMOIDOSCOPY;  Surgeon: Arta Silence, MD;  Location: [name];  Service: Endoscopy;  Laterality: N/A;  poor prep  . HOLMIUM LASER APPLICATION N/A [id number]   Procedure: HOLMIUM LASER APPLICATION;  Surgeon: Nathaniel Frock, MD;  Location: [facility];  Service: Urology;  Laterality: N/A;  . INGUINAL HERNIA REPAIR Left 1965  . LEFT KNEE SURGERY  1940  . MOHS SURGERY  x9  last one 2014   . ORIF LEFT ANKLE FX  1935  . ROTATOR CUFF REPAIR Bilateral right 2013/  left 2010  . TRANSTHORACIC ECHOCARDIOGRAM  06-06-2013   ef 55-60%/  mild AR/  mild dilated aortic root/  mild LAE and RAE/  mild to moderate TR/  small pericardial effusion identified    SOCIAL HISTORY: Social History    Social History  . Marital Status: Widowed     Spouse Name: N/A  . Number of Children: 4 daughters and one son, and 5 stepchildren   . Years of Education: N/A   Occupational History  . 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 Smoking status: Never Smoker   . Smokeless tobacco: Not on file  . Alcohol Use: 0.6 oz/week    1 Glasses of wine per week     Comment: 1 glass of wine  . Drug Use: No  . Sexual Activity: Not on file   Other Topics Concern  . Not on file   Social History Narrative    FAMILY HISTORY: Family History  Problem Relation Age of Onset  . Stroke Father   . Cancer  Mother     spine ,died of parkinson's    ALLERGIES:  is allergic to cinnamon; ibuprofen; percocet [oxycodone-acetaminophen]; protonix [pantoprazole sodium]; and xarelto [rivaroxaban].  MEDICATIONS:  Current Outpatient Prescriptions  Medication Sig Dispense Refill  . aspirin EC 325 MG tablet Take 325 mg by mouth every evening.    . docusate sodium (COLACE) 100 MG capsule Take 100 mg by mouth 2 (two) times daily.    . finasteride (PROSCAR) 5 MG tablet Take 5 mg by mouth every evening. Dose is taken at bedtime    . levothyroxine (SYNTHROID, LEVOTHROID) 88 MCG tablet Take 88 mcg by mouth daily before breakfast.     . metoprolol succinate (TOPROL-XL) 25 MG 24 hr tablet Take 0.5 tablets (12.5 mg total) by mouth daily. Please call and schedule a one year follow up appointment 45 tablet 0  . Multiple Vitamin (MULTIVITAMIN WITH MINERALS) TABS tablet Take 1 tablet by mouth daily.    . polyethylene glycol (MIRALAX / GLYCOLAX) packet Take 17 g by mouth daily as needed.    . traMADol (ULTRAM) 50 MG tablet Take 1 tablet (50 mg total) by mouth every 6 (six) hours as needed for moderate pain. Post-operatively (Patient not taking: Reported on 03/04/2016) 30 tablet 0   No current facility-administered medications for this visit.     REVIEW OF SYSTEMS:   Constitutional: Denies  fevers, chills or abnormal night sweats Eyes: Denies blurriness of vision, double vision or watery eyes Ears, nose, mouth, throat, and face: Denies mucositis or sore throat Respiratory: Denies cough, dyspnea or wheezes Cardiovascular: Denies palpitation, chest discomfort or lower extremity swelling Gastrointestinal:  Denies nausea, heartburn or change in bowel habits Skin: Denies abnormal skin rashes Lymphatics: Denies new lymphadenopathy or easy bruising Neurological:Denies numbness, tingling or new weaknesses Behavioral/Psych: Mood is stable, no new changes  All other systems were reviewed with the patient and are negative.  PHYSICAL EXAMINATION: ECOG PERFORMANCE STATUS: 2-3  Vitals:   06/11/16 1032  BP: (!) 184/74  Pulse: 65  Resp: 17  Temp: 97.6 F (36.4 C)   Filed Weights   06/11/16 1032  Weight: 190 lb 12.8 oz (86.5 kg)    GENERAL:alert, no distress and comfortable SKIN: skin color, texture, turgor are normal, no rashes or significant lesions except surgical change on right cheek and nose  EYES: normal, conjunctiva are pink and non-injected, sclera clear OROPHARYNX:no exudate, no erythema and lips, buccal mucosa, and tongue normal  NECK: supple, thyroid normal size, non-tender, without nodularity LYMPH:  no palpable lymphadenopathy in the cervical, axillary or inguinal LUNGS: clear to auscultation and percussion with normal breathing effort HEART: regular rate & rhythm and no murmurs and no lower extremity edema ABDOMEN:abdomen soft, non-tender and normal bowel sounds. No enlarged inguinal lymph nodes. Rectal exam showed a palpable small scar versus a small residual tumor in the low rectum anteriorly. PSYCH: alert & oriented x 3 with fluent speech.  NEURO: no focal motor/sensory deficits   LABORATORY DATA:  I have reviewed the data as listed CBC Latest Ref Rng & Units 06/11/2016 02/12/2016 02/06/2016  WBC 4.0 - 10.3 10e3/uL 4.5 - 4.2  Hemoglobin 13.0 - 17.1 g/dL 12.6(L)  12.2(L) 11.9(L)  Hematocrit 38.4 - 49.9 % 38.2(L) 36.0(L) 36.4(L)  Platelets 140 - 400 10e3/uL 219 - 342    Recent Labs  01/21/16 1131 02/06/16 0853 02/12/16 1131 06/11/16 0944  NA 138 139 140 140  K 3.5 4.4 3.9 4.1  CL 104  --  103  --  CO2 25 26  --  27  GLUCOSE 120* 114 95 101  BUN 23* 15.7 11 14.9  CREATININE 0.93 0.8 0.70 0.8  CALCIUM 8.6* 9.2  --  9.4  GFRNONAA >60  --   --   --   GFRAA >60  --   --   --   PROT 6.7 6.4  --  6.8  ALBUMIN 3.7 2.9*  --  3.2*  AST 46* 16  --  16  ALT 23 15  --  12  ALKPHOS 75 109  --  130  BILITOT 1.5* 0.41  --  0.44   Results for DYWANE, COLSTON (MRN [id number]) as of 02/13/2016 08:16  Ref. Range 08/15/2015 10:32 09/11/2015 09:04 12/12/2015 08:59  CEA Latest Units: ng/mL 2.9 2.1 2.2   PATHOLOGY REPORT  Diagnosis 07/16/2015  Rectum, biopsy, mass - INVASIVE ADENOCARCINOMA WITH SIGNET RING CELL FEATURES. - LYMPHOVASCULAR INVASION IS IDENTIFIED. - SEE COMMENT. Microscopic Comment To help evaluate the case, immunohistochemistry stains were performed. The malignant cells are positive for CDX2 and cytokeratin AE1/AE3. There is focal staining for CD56, a neuroendocrine marker. Prostein and S-100 stains are negative. Overall, the features are consistent with invasive adenocarcinoma of colonic origin, with signet ring cell features. The case was discussed with Dr. Cristina [name] on 07/18/15. (JBK:ds 07/18/15)   RADIOGRAPHIC STUDIES: I have personally reviewed the radiological images as listed and agreed with the findings in the report. No results found.   Flexible  Sigmoidoscopy 07/16/2015 by Dr. Cristina [name]  impression , an infiltrative no obstructing large mass was found in the distal rectum. The mass was partially circumferential (involving one third of the lumen circumference ). The mass measured 5 cm in length.  Biopsy was obtained.  LOWER ENDOSCOPY [place] 08/08/2015  STAGING: T3 N1 Mx by endorectal ultrasound (limited study). ENDOSCOPIC  IMPRESSION: Poor prep; unable to do colonoscopy. Rectal mass (adenocarcinoma on biopsies). Staging as above.    ASSESSMENT & PLAN:  82 year old Caucasian male, with past medical history of ulcerative colitis, hypertension and hypothyroidism, who lives in a independent living, presented with bowel habits change and rectal bleeding.  1. Low rectal adenocarcinoma, cT3N1M0, stage IIIB  -he is s/p concurrent chemotherapy and radiation, tolerated very well overall. His symptoms improved after treatment. -He declined surgery due to his advanced age and long recovery from surgery -I discussed that his rectal cancer is unlikely cured by neoadjuvant chemotherapy and radiation alone. He will likely grow again without surgical resection, which can cause obstruction, bleeding and pain. He may need diverging colostomy down the road.  -Rectal stent placement may not be an option due to the distal location -I discussed his restaging CT scan from 02/2016, which showed a stable rectal wall thickening, no metastatic adenopathy or distant metastasis. -I will obtain the report of his repeated  flexible sigmoidoscopy by Dr. Alycia Rossetti -He is clinically doing well, lab results reviewed with him, physical exam including rectal exam were unremarkable, except a possible small residual tumor versus scar, we'll continue observation.  2. Low back pain -secondary to spinal stenosis and rectal pain, It has much improved after he completed chemoirradiation  3. HTN, AF, arthritis -He will continue follow-up with his primary care physician -will watch his blood pressure and heart rate to closely during the concurrent chemoradiation.  4. Anemia -developed after chemoRT -Improving  5. Skin cancer -He will continue to follow-up with his dermatologist   Plan -Continue observation and supportive care -I'll see him back in 4 months with lab  and exam.  All questions were answered. The patient knows to call the clinic with  any problems, questions or concerns.  I spent 25 minutes counseling the patient face to face. The total time spent in the appointment was 30 minutes and more than 50% was on counseling.     Nathaniel Merle, MD 06/11/2016 10:38 AM

## 2016-06-11 NOTE — Telephone Encounter (Signed)
Gv pt appt for 10/14/16 @  9.45am.

## 2016-06-11 NOTE — Progress Notes (Signed)
Oncology Nurse Navigator Documentation  Oncology Nurse Navigator Flowsheets 06/11/2016  Navigator Location [facility]  Navigator Encounter Type Follow-up Appt  Telephone -  Abnormal Finding Date -  Confirmed Diagnosis Date -  Treatment Initiated Date -  Patient Visit Type MedOnc;Follow-up  Treatment Phase Survivorship  Barriers/Navigation Needs No barriers at this time;No Questions;No Needs  Education -  Interventions None required--Final planned contact with Mr. Biscoe  Coordination of Care -  Education Method -  Support Groups/Services -  Acuity -  Time Spent with Patient -

## 2016-06-12 LAB — CEA: CEA1: 3.4 ng/mL (ref 0.0–4.7)

## 2016-07-02 DIAGNOSIS — Z23 Encounter for immunization: Secondary | ICD-10-CM | POA: Diagnosis not present

## 2016-07-03 DIAGNOSIS — M47816 Spondylosis without myelopathy or radiculopathy, lumbar region: Secondary | ICD-10-CM | POA: Diagnosis not present

## 2016-07-03 DIAGNOSIS — M5136 Other intervertebral disc degeneration, lumbar region: Secondary | ICD-10-CM | POA: Diagnosis not present

## 2016-07-03 DIAGNOSIS — M48062 Spinal stenosis, lumbar region with neurogenic claudication: Secondary | ICD-10-CM | POA: Diagnosis not present

## 2016-07-03 DIAGNOSIS — M4310 Spondylolisthesis, site unspecified: Secondary | ICD-10-CM | POA: Diagnosis not present

## 2016-07-07 DIAGNOSIS — D0362 Melanoma in situ of left upper limb, including shoulder: Secondary | ICD-10-CM | POA: Diagnosis not present

## 2016-07-07 DIAGNOSIS — L905 Scar conditions and fibrosis of skin: Secondary | ICD-10-CM | POA: Diagnosis not present

## 2016-07-07 DIAGNOSIS — D485 Neoplasm of uncertain behavior of skin: Secondary | ICD-10-CM | POA: Diagnosis not present

## 2016-07-14 ENCOUNTER — Other Ambulatory Visit: Payer: Self-pay | Admitting: Cardiology

## 2016-07-29 ENCOUNTER — Encounter: Payer: Self-pay | Admitting: Cardiology

## 2016-07-29 ENCOUNTER — Ambulatory Visit (INDEPENDENT_AMBULATORY_CARE_PROVIDER_SITE_OTHER): Payer: Medicare Other | Admitting: Cardiology

## 2016-07-29 VITALS — BP 132/62 | HR 78 | Ht 72.0 in | Wt 196.0 lb

## 2016-07-29 DIAGNOSIS — R609 Edema, unspecified: Secondary | ICD-10-CM

## 2016-07-29 DIAGNOSIS — I480 Paroxysmal atrial fibrillation: Secondary | ICD-10-CM

## 2016-07-29 DIAGNOSIS — R0602 Shortness of breath: Secondary | ICD-10-CM | POA: Diagnosis not present

## 2016-07-29 MED ORDER — POTASSIUM CHLORIDE ER 10 MEQ PO TBCR: 10.0000 meq | EXTENDED_RELEASE_TABLET | Freq: Every day | ORAL | 1 refills | Status: DC

## 2016-07-29 MED ORDER — FUROSEMIDE 20 MG PO TABS: 20.0000 mg | ORAL_TABLET | Freq: Every day | ORAL | 1 refills | Status: DC

## 2016-07-29 NOTE — Patient Instructions (Signed)
Medication Instructions:  Start lasix (furosemide) 20mg  daily.  Start KCL (potassium) 10 mEq daily  Labwork: BMET/BNP in 2 weeks.  Testing/Procedures: Your physician has requested that you have an echocardiogram. Echocardiography is a painless test that uses sound waves to create images of your heart. It provides your doctor with information about the size and shape of your heart and how well your heart's chambers and valves are working. This procedure takes approximately one hour. There are no restrictions for this procedure.    Follow-Up: Your physician wants you to follow-up in: 6 months with [name], NP (May 2018). You will receive a reminder letter in the mail two months in advance. If you don't receive a letter, please call our office to schedule the follow-up appointment.       If you need a refill on your cardiac medications before your next appointment, please call your pharmacy.

## 2016-07-31 NOTE — Progress Notes (Signed)
Patient ID: [facility]iel [name], male   DOB: [birth year], 82 y.o.   MRN: [id number] PCP: Dr. Rex Kras  82 yo with hospitalization in 9/14 for presumed acute pericarditis and atrial fibrillation presents for cardiology followup.  He was admitted in early 9/14 with pleuritic chest pain.  He had diffuse slight ST elevation on ECG and was thought to have acute pericarditis, ?viral pericarditis.  No pericardial effusion initially.  While in the hospital, he went into atrial fibrillation with RVR and was treated with amiodarone.  He converted to NSR and remains in NSR today.  He was started on Xarelto in the hospital.  Followup echo later in 9/14 showed the development of a moderate pericardial effusion without tamponade.  Xarelto was stopped.  Patient had another echo on 06/06/13 that showed a small pericardial effusion (improved).    He was diagnosed with rectal cancer and underwent chemotherapy and radiation, opted against surgery.   Since I last saw him, he has not had any more chest pain.  He has not had tachypalpitations.  He is in NSR today.  No dyspnea.  He is limited by knee pain and low back pain.  He uses a walker for balance and because of back pain.  He has chronic lower extremity edema, this may looks worse than in the past.  Weight is up this appointment, says he eats a lot and gets no exercise.  He is short of breath with dressing, does ok walking on flat ground though.  No orthopnea/PND.   Labs (9/14): K 4.7, creatinine 1.0, ESR 44 Labs (10/14): TSH normal, LFTs normal Labs (1/15): LFTs normal, TSH normal Labs (7/15): BNP 49, K 4.1, creatinine 1.0 Labs (10/17): K 4.1, creatinine 0.8, hgb 12.6  ECG: NSR, LVH, 1st degree AVB, PACs/PVCs.   PMH: 1. Hyperlipidemia 2. Hypothyroidism 3. HTN 4. Acute pericarditis: 9/14.  Echo (05/11/13) with EF 55-60%, normal RV, no pericardial effusion.  Echo (05/22/13) with moderate pericardial effusion, no tamponade.  Echo (06/06/13) with small pericardial effusion, no  tamponade.  5. Paroxysmal atrial fibrillation: In setting of acute pericarditis when initially noted.  Was started on Xarelto but this was stopped when he developed a pericardial effusion.  6. Chronic low back pain 7. Venous insufficiency.  8. Rectal cancer: s/p chemotherapy and radiation.  Decided against surgery.  9. Nephrolithiasis.   SH: Lives alone at [place].  Nonsmoker.   FH: No premature CAD.   ROS: All systems reviewed and negative except as per HPI.   Current Outpatient Prescriptions  Medication Sig Dispense Refill  . aspirin EC 325 MG tablet Take 325 mg by mouth every evening.    . docusate sodium (COLACE) 100 MG capsule Take 100 mg by mouth 2 (two) times daily.    . finasteride (PROSCAR) 5 MG tablet Take 5 mg by mouth every evening. Dose is taken at bedtime    . levothyroxine (SYNTHROID, LEVOTHROID) 88 MCG tablet Take 88 mcg by mouth daily before breakfast.     . metoprolol succinate (TOPROL-XL) 25 MG 24 hr tablet Take 0.5 tablets (12.5 mg total) by mouth daily. 45 tablet 0  . Multiple Vitamin (MULTIVITAMIN WITH MINERALS) TABS tablet Take 1 tablet by mouth daily.    . polyethylene glycol (MIRALAX / GLYCOLAX) packet Take 17 g by mouth daily as needed.    . traMADol (ULTRAM) 50 MG tablet Take 1 tablet (50 mg total) by mouth every 6 (six) hours as needed for moderate pain. Post-operatively 30 tablet 0  . furosemide (  LASIX) 20 MG tablet Take 1 tablet (20 mg total) by mouth daily. 90 tablet 1  . potassium chloride (K-DUR) 10 MEQ tablet Take 1 tablet (10 mEq total) by mouth daily. 90 tablet 1   No current facility-administered medications for this visit.     BP 132/62   Pulse 78   Ht 6' (1.829 m)   Wt 196 lb (88.9 kg)   BMI 26.58 kg/m  General: NAD Neck: No JVD, no thyromegaly or thyroid nodule.  Lungs: Slight crackles at bases bilaterally.  CV: Nondisplaced PMI.  Heart regular S1/S2, no S3/S4, 2/6 early SEM RUSB, no rub.  2+ edema to knees bilaterally.  No carotid  bruit.  Normal pedal pulses.  Abdomen: Soft, nontender, no hepatosplenomegaly, no distention.  Skin: Intact without lesions or rashes.  Neurologic: Alert and oriented x 3.  Psych: Normal affect. Extremities: No clubbing or cyanosis.   Assessment/Plan: 1. Acute pericarditis: No recurrent chest pain. 2. Atrial fibrillation: Paroxysmal.  Had only known episode in the setting of acute pericarditis.  Now in NSR.  Xarelto was stopped due to possibility that it was associated with the development of pericardial effusion. He was initially on amiodarone but is off this also.  He is on ASA 325 daily.  I suspect that the atrial fibrillation was triggered by acute pericarditis and hopefully he will stay out of atrial fibrillation long-term.  3. Pericardial effusion: Related to acute pericarditis and possibly to Xarelto use. Off Xarelto.   4. Lower extremity edema: No JVD, BNP normal in the past.  Suspect venous insufficiency.  However, weight is up and edema does look somewhat worse than in the past.  - Start low dose Lasix 20 mg daily with KCl 10 daily.  BMET/BNP in 2 wks.  - Echo to reassess LV and RV function.  - Wear compression stockings.   I will have him see [name] in 6 months.   Loralie Champagne 07/31/2016

## 2016-08-25 ENCOUNTER — Other Ambulatory Visit: Payer: Self-pay

## 2016-08-25 ENCOUNTER — Ambulatory Visit (HOSPITAL_COMMUNITY): Payer: Medicare Other | Attending: Cardiovascular Disease

## 2016-08-25 DIAGNOSIS — R0602 Shortness of breath: Secondary | ICD-10-CM | POA: Diagnosis not present

## 2016-08-25 DIAGNOSIS — I10 Essential (primary) hypertension: Secondary | ICD-10-CM | POA: Diagnosis not present

## 2016-08-25 DIAGNOSIS — I071 Rheumatic tricuspid insufficiency: Secondary | ICD-10-CM | POA: Diagnosis not present

## 2016-08-25 DIAGNOSIS — I480 Paroxysmal atrial fibrillation: Secondary | ICD-10-CM | POA: Insufficient documentation

## 2016-08-25 DIAGNOSIS — C20 Malignant neoplasm of rectum: Secondary | ICD-10-CM | POA: Diagnosis not present

## 2016-09-24 NOTE — Progress Notes (Signed)
Mr. Kantrell Reeves. Deschepper 83 y.o. with Low rectal adenocarcinoma, cT3N1M0, stage IIIB FU.      Pain:None Diarrhea:Having regular bowel movement every third day. Fatigue:Having mild fatigue thinks it is due to his age. Skin changes:None Urinary and Bladder changes:  Incontinent Weight: Appetite is good Wt Readings from Last 3 Encounters:  09/30/16 199 lb (90.3 kg)  07/29/16 196 lb (88.9 kg)  06/11/16 190 lb 12.8 oz (86.5 kg)  BP (!) 170/72   Pulse 79   Temp 97.9 F (36.6 C) (Oral)   Resp 18   Ht (!) 6" (0.152 m)   Wt 199 lb (90.3 kg)   SpO2 95%   BMI 3886.45 kg/m

## 2016-09-30 ENCOUNTER — Encounter: Payer: Self-pay | Admitting: Radiation Oncology

## 2016-09-30 ENCOUNTER — Ambulatory Visit
Admission: RE | Admit: 2016-09-30 | Discharge: 2016-09-30 | Disposition: A | Discharge status: Home / Self Care | Payer: Medicare Other | Source: Ambulatory Visit | Attending: Radiation Oncology | Admitting: Radiation Oncology

## 2016-09-30 ENCOUNTER — Telehealth: Payer: Self-pay | Admitting: *Deleted

## 2016-09-30 VITALS — BP 170/72 | HR 79 | Temp 97.9°F | Resp 18 | Ht <= 58 in | Wt 199.0 lb

## 2016-09-30 DIAGNOSIS — C20 Malignant neoplasm of rectum: Secondary | ICD-10-CM

## 2016-09-30 DIAGNOSIS — K649 Unspecified hemorrhoids: Secondary | ICD-10-CM

## 2016-09-30 DIAGNOSIS — Z85038 Personal history of other malignant neoplasm of large intestine: Secondary | ICD-10-CM | POA: Diagnosis not present

## 2016-09-30 DIAGNOSIS — K625 Hemorrhage of anus and rectum: Secondary | ICD-10-CM

## 2016-09-30 DIAGNOSIS — Z08 Encounter for follow-up examination after completed treatment for malignant neoplasm: Secondary | ICD-10-CM | POA: Diagnosis not present

## 2016-09-30 NOTE — Telephone Encounter (Signed)
error 

## 2016-10-02 NOTE — Progress Notes (Signed)
Radiation Oncology         [phone] ________________________________  Name: Nathaniel [name] MRN: [id number]  Date: 09/30/2016  DOB: 1919/11/15  Follow-Up Visit Note  CC: Gennette Pac, MD  [name], MD  Diagnosis:   Low rectal adenocarcinoma, cT3N1M0, stage IIIB   Interval Since Last Radiation:  12 months  08/12/15-09/17/15: 50.4 Gy total with 45 Gy to the rectum and a 5.4 Gy boost  Narrative:  The patient returns today for routine surveillance of  rectal cancer which was diagnosed in 2016 . He underwent chemotherapy sensitization with Xeloda and radiotherapy which he completed on 09/17/2015. He declined surgical resection his age and comorbidities, and understands that he will likely develop subsequent disease. His most recent CT scan on 02/07/2016  revealed grossly stable rectal wall thickening without evidence of adenopathy or distant disease. In June 2017, he met with Dr. Shonna Chock and sigmoidoscopy did not reveal concerns for disease.  He was seen by Dr. Burr Medico on 06/11/16, and at that time did not have any clinical evidence of disease.                On review of systems, the patient reports that he is doing well overall. He denies any chest pain, shortness of breath, cough, fevers, chills, night sweats, unintended weight changes. He denies any bladder disturbances, and denies abdominal pain, nausea or vomiting. Occasionally he will have fecal urgency and fecal incontinence, though he has a history of this for decades. He has had several episodes of spotting on tissue when wiping after a bowel movement. This has happened 2-3 times in the last 4-6 months. He denies any new musculoskeletal or joint aches or pains, new skin lesions or concerns. He plans to participate in a balance trial being conducted by his primary care team. A complete review of systems is obtained and is otherwise negative.    Past Medical History:  Past Medical History:  Diagnosis Date  . Anemia due to  antineoplastic chemotherapy   . Atherosclerosis of coronary artery cardiologist-  dr Aundra Dubin   aorta and coronary arteries moderate per ct 02-07-2016  . Bladder calculi   . BPH (benign prostatic hyperplasia)   . Calculi, urethra   . Chronic low back pain   . Constipation   . ED (erectile dysfunction)   . Edema of lower extremity   . Foley catheter in place   . History of adenomatous polyp of colon    2007  . History of basal cell carcinoma excision    multiple  . History of cellulitis    left lower leg-- resolved  . History of melanoma excision    temple right side/ multiple  . History of obstructive sleep apnea    per pt cured by hypnosis  . History of pericarditis    acute episode 09/ 2014  per cardiologist note , dr Aundra Dubin , related possibly to xarelto use  . History of ulcerative colitis    remote  . HTN (hypertension)   . Hypercholesteremia   . Hypothyroidism   . Incomplete right bundle branch block   . LAFB (left anterior fascicular block)   . OA (osteoarthritis)   . PAF (paroxysmal atrial fibrillation) [facility]) cardiologist-  dr dalton Aundra Dubin   dx 09/ 2014  in setting of acute pericarditis   . Rectal cancer [facility]) dx 07-16-2015  oncologist-  dr [name]   Stage IIIB (cT3, N1, M0)-- concurrent  chemotherapy started 08-12-2015/  radiation complete (08-12-2015 to 09-20-2015)  .  S/P radiation therapy 08/12/15-09/20/15   rectal ca50.4Gy total dose  . Spinal stenosis     Past Surgical History: Past Surgical History:  Procedure Laterality Date  . CATARACT EXTRACTION W/ INTRAOCULAR LENS IMPLANT Bilateral 10/ 2016  . CYSTOSCOPY WITH LITHOLAPAXY N/A 02/12/2016   Procedure: CYSTOSCOPY WITH LITHOLAPAXY;  Surgeon: Alexis Frock, MD;  Location: [facility];  Service: Urology;  Laterality: N/A;  . EUS N/A 08/08/2015   Procedure: LOWER ENDOSCOPIC ULTRASOUND (EUS);  Surgeon: Arta Silence, MD;  Location: [facility] ENDOSCOPY;  Service: Endoscopy;  Laterality: N/A;  . FLEXIBLE  SIGMOIDOSCOPY N/A 08/08/2015   Procedure: FLEXIBLE SIGMOIDOSCOPY;  Surgeon: Arta Silence, MD;  Location: [facility] ENDOSCOPY;  Service: Endoscopy;  Laterality: N/A;  poor prep  . HOLMIUM LASER APPLICATION N/A 0/04/1447   Procedure: HOLMIUM LASER APPLICATION;  Surgeon: Alexis Frock, MD;  Location: [facility];  Service: Urology;  Laterality: N/A;  . INGUINAL HERNIA REPAIR Left 1965  . LEFT KNEE SURGERY  1940  . MOHS SURGERY  x9  last one 2014   . ORIF LEFT ANKLE FX  1935  . ROTATOR CUFF REPAIR Bilateral right 2013/  left 2010  . TRANSTHORACIC ECHOCARDIOGRAM  06-06-2013   ef 55-60%/  mild AR/  mild dilated aortic root/  mild LAE and RAE/  mild to moderate TR/  small pericardial effusion identified    Social History:  Social History   Social History  . Marital status: Married    Spouse name: N/A  . Number of children: N/A  . Years of education: N/A   Occupational History  . Not on file.   Social History Main Topics  . Smoking status: Never Smoker  . Smokeless tobacco: Never Used  . Alcohol use 0.6 oz/week    1 Glasses of wine per week     Comment: 1 glass of wine per week  . Drug use: No  . Sexual activity: Not on file   Other Topics Concern  . 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 Stroke Father   . Cancer Mother     spine ,died of parkinson's    ALLERGIES:  is allergic to cinnamon; ibuprofen; percocet [oxycodone-acetaminophen]; protonix [pantoprazole sodium]; and xarelto [rivaroxaban].  Meds: Current Outpatient Prescriptions  Medication Sig Dispense  Refill  . aspirin EC 325 MG tablet Take 325 mg by mouth every evening.    . finasteride (PROSCAR) 5 MG tablet Take 5 mg by mouth every evening. Dose is taken at bedtime    . furosemide (LASIX) 20 MG tablet Take 1 tablet (20 mg total) by mouth daily. 90 tablet 1  . levothyroxine (SYNTHROID, LEVOTHROID) 88 MCG tablet Take 88 mcg by mouth daily before breakfast.     . metoprolol succinate (TOPROL-XL) 25 MG 24 hr tablet Take 0.5 tablets (12.5 mg total) by mouth daily. 45 tablet 0  . Multiple Vitamin (MULTIVITAMIN WITH MINERALS) TABS tablet Take 1 tablet by mouth daily.    . potassium chloride (K-DUR) 10 MEQ tablet Take 1 tablet (10 mEq total) by mouth daily. Moore  tablet 1  . polyethylene glycol (MIRALAX / GLYCOLAX) packet Take 17 g by mouth daily as needed.    . traMADol (ULTRAM) 50 MG tablet Take 1 tablet (50 mg total) by mouth every 6 (six) hours as needed for moderate pain. Post-operatively (Patient not taking: Reported on 09/30/2016) 30 tablet 0   No current facility-administered medications for this encounter.     Physical Findings:  height is 6" (0.152 m) (abnormal) and weight is 199 lb (90.3 kg). His oral temperature is 97.9 F (36.6 C). His blood pressure is 170/72 (abnormal) and his pulse is 79. His respiration is 18 and oxygen saturation is 95%. .   In general this is a well appearing though elderly Caucasian male in no acute distress. He is alert and oriented x4 and appropriate throughout the examination. HEENT reveals that the patient is normocephalic, atraumatic. EOMs are intact. PERRLA. Skin is intact without any evidence of gross lesions. Cardiovascular exam reveals a regular rate and rhythm, no clicks rubs or murmurs are auscultated. Chest is clear to auscultation bilaterally. Lymphatic assessment is performed and does not reveal any adenopathy in the cervical, supraclavicular, axillary, or inguinal chains. Abdomen has active bowel sounds in all quadrants and is intact. The abdomen is soft,  non tender, non distended. Lower extremities are negative for pretibial pitting edema, deep calf tenderness, cyanosis or clubbing. inspection of the anal verge reveals two small hemorrhoids at the base of the anus at 12 o'clock. Digital rectal exam is performed and does not reveal any palpable thickening, or tumor. The exam is slightly limited due to stool in the rectal vault. No blood is noted on the examination glove.  Lab Findings: Lab Results  Component Value Date   WBC 4.5 06/11/2016   HGB 12.6 (L) 06/11/2016   HCT 38.2 (L) 06/11/2016   MCV 92.7 06/11/2016   PLT 219 06/11/2016     Radiographic Findings: No results found.  Impression/Plan: 1. Low rectal adenocarcinoma, cT3N1M0, stage IIIB. The patient appears to be clinically NED. He will return in 6  month's time for follow up with me, and continue to be seen with Dr. Burr Medico in an alternating pattern.  He is encouraged to call if he has any questions or concerns prior to his next visit.  2. Rectal bleeding. Although his episodes are infrequent, and could be contributed to hemorrhoids, we will follow this closely. He will contact Dr. Shonna Chock if he continues to have symptoms for sigmoidoscopy.    Carola Rhine, PAC

## 2016-10-05 ENCOUNTER — Telehealth: Payer: Self-pay | Admitting: Hematology

## 2016-10-05 NOTE — Telephone Encounter (Signed)
spoke to patient to advise that appointment has been changed for same day 10/14/16 from 10:15am to 11:45am

## 2016-10-12 ENCOUNTER — Other Ambulatory Visit: Payer: Self-pay | Admitting: Cardiology

## 2016-10-12 NOTE — Progress Notes (Signed)
Nathaniel [name]  [phone] [phone]  Clinic follow up Note   Patient Care Team: Nathaniel Fess, MD as PCP - General (Family Medicine) Nathaniel Lobo, MD as Consulting Physician (Gastroenterology) Nathaniel Ade, RN as Registered Nurse 10/14/2016  CHIEF COMPLAINTS:  Follow up rectal cancer  Oncology History    Rectal cancer [facility])   Staging form: Colon and Rectum, AJCC 7th Edition     Clinical stage from 08/08/2015: Stage IIIB (T3, N1, M0) - Unsigned       Rectal cancer ([place])   07/16/2015 Pathology Results    Biopsy positive for invasive adenocarcinoma with signet ring cell features      07/23/2015 Initial Diagnosis    Rectal cancer ([place])      08/12/2015 Concurrent Chemotherapy    Xeloda 1500mg  q12h on the day of radiation       08/12/2015 - 09/20/2015 Radiation Therapy    radiaiton to rectal cancer       02/18/2016 Procedure    FLEX SIGMOIDOSCOPY: Endoscopic disappearance of previous rectal mass; atrophic mucosa in distal rectum  per Dr. Cristina [name]      02/20/2016 Pathology Results    No adenomatous change or malignancy        HISTORY OF PRESENTING ILLNESS:  Nathaniel [name] 83 y.o. male is here because of recently diagnosed rectal cancer. He presents to the clinic by himself.  He has had abnormal bowel movement for the past 2-3 months. It's usually very small amount, loose, every time he urinates, he has a such a small bowel movement. He does not really have a good normal bowel movement. He also reports frequent stool stains on his underwear. He noticed small amount blood in his stool recently, which prompt his seeing GI Dr. Cristina [name] on. He underwent a flexible sigmoidoscopy in the office, which showed a rectal mass, biopsy reviewed adenocarcinoma. He denies significant rectal pain, abdominal discomfort, or other new complaints.  He lives in a independent living facility, he is able to take care of his all ADLs, and does some light  housework, shopping, by himself. He uses a walker, still drives. He does get fatigued after activity, his energy level has seemed to be decreasing in over the past few years. No significant change daily. He has decent appetite and eats well.  He had remote history of ulcerative colitis, which has been in remission. 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  He is widowed, has 5 children, who all live in out states. He does have several stepchildren from his secondary to, and some of them live in [place].  CURRENT TREATMENT: observation   INTERIM HISTORY Nathaniel [name] returns for follow-up. He is doing well overall, main complain is frequent bowel movement, usually formed, no black stool or blood in stool,  3-6 times a day, and partial stool incontinence. He wares diaper. He otherwise feels well, denies any pain, bloating, nausea or other symptoms. He is able to function well at home.    MEDICAL HISTORY:  Past Medical History:  Diagnosis Date  . Anemia due to antineoplastic chemotherapy   . Atherosclerosis of coronary artery cardiologist-  dr Nathaniel [name]   aorta and coronary arteries moderate per ct 02-07-2016  . Bladder calculi   . BPH (benign prostatic hyperplasia)   . Calculi, urethra   . Chronic low back pain   . Constipation   . ED (erectile dysfunction)   . Edema of lower extremity   .  Foley catheter in place   . History of adenomatous polyp of colon    2007  . History of basal cell carcinoma excision    multiple  . History of cellulitis    left lower leg-- resolved  . History of melanoma excision    temple right side/ multiple  . History of obstructive sleep apnea    per pt cured by hypnosis  . History of pericarditis    acute episode 09/ 2014  per cardiologist note , dr Nathaniel [name] , related possibly to xarelto use  . History of ulcerative colitis    remote  . HTN (hypertension)   . Hypercholesteremia   . Hypothyroidism     . Incomplete right bundle branch block   . LAFB (left anterior fascicular block)   . OA (osteoarthritis)   . PAF (paroxysmal atrial fibrillation) [facility]) cardiologist-  dr Nathaniel [name]   dx 09/ 2014  in setting of acute pericarditis   . Rectal cancer [facility]) dx 07-16-2015  oncologist-  dr Nathaniel [name]   Stage IIIB (cT3, N1, M0)-- concurrent  chemotherapy started 08-12-2015/  radiation complete (08-12-2015 to 09-20-2015)  . S/P radiation therapy 08/12/15-09/20/15   rectal ca50.4Gy total dose  . Spinal stenosis     SURGICAL HISTORY: Past Surgical History:  Procedure Laterality Date  . CATARACT EXTRACTION W/ INTRAOCULAR LENS IMPLANT Bilateral 10/ 2016  . CYSTOSCOPY WITH LITHOLAPAXY N/A 02/12/2016   Procedure: CYSTOSCOPY WITH LITHOLAPAXY;  Surgeon: Nathaniel Frock, MD;  Location: [facility];  Service: Urology;  Laterality: N/A;  . EUS N/A 08/08/2015   Procedure: LOWER ENDOSCOPIC ULTRASOUND (EUS);  Surgeon: Nathaniel Silence, MD;  Location: [facility] ENDOSCOPY;  Service: Endoscopy;  Laterality: N/A;  . FLEXIBLE SIGMOIDOSCOPY N/A 08/08/2015   Procedure: FLEXIBLE SIGMOIDOSCOPY;  Surgeon: Nathaniel Silence, MD;  Location: [facility] ENDOSCOPY;  Service: Endoscopy;  Laterality: N/A;  poor prep  . HOLMIUM LASER APPLICATION N/A [id number]   Procedure: HOLMIUM LASER APPLICATION;  Surgeon: Nathaniel Frock, MD;  Location: [facility];  Service: Urology;  Laterality: N/A;  . INGUINAL HERNIA REPAIR Left 1965  . LEFT KNEE SURGERY  1940  . MOHS SURGERY  x9  last one 2014   . ORIF LEFT ANKLE FX  1935  . ROTATOR CUFF REPAIR Bilateral right 2013/  left 2010  . TRANSTHORACIC ECHOCARDIOGRAM  06-06-2013   ef 55-60%/  mild AR/  mild dilated aortic root/  mild LAE and RAE/  mild to moderate TR/  small pericardial effusion identified    SOCIAL HISTORY: Social History   Social History  . Marital status: Married    Spouse name: N/A  . Number of children: N/A  . Years of education: N/A   Social History Main  Topics  . Smoking status: Never Smoker  . Smokeless tobacco: Never Used  . Alcohol use 0.6 oz/week    1 Glasses of wine per week     Comment: 1 glass of wine per week  . Drug use: No  . Sexual activity: Not Asked   Other Topics Concern  . 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 Stroke Father   . Cancer Mother     spine ,died of parkinson's    ALLERGIES:  is allergic to cinnamon; ibuprofen; percocet [oxycodone-acetaminophen]; protonix [pantoprazole sodium]; and xarelto [rivaroxaban].  MEDICATIONS:  Current Outpatient Prescriptions  Medication Sig Dispense Refill  . aspirin EC 325 MG tablet Take 325 mg by mouth daily.     . finasteride (PROSCAR) 5 MG tablet Take 5 mg by mouth every evening. Dose is taken at bedtime    . furosemide (LASIX) 20 MG tablet Take 1 tablet (20 mg total) by mouth daily. 90 tablet 1  . levothyroxine (SYNTHROID, LEVOTHROID) 88 MCG tablet Take 88 mcg by mouth daily before breakfast.     . metoprolol succinate (TOPROL-XL) 25 MG 24 hr tablet TAKE 1/2 TABLET BY MOUTH DAILY. 45 tablet 2  . Multiple Vitamin (MULTIVITAMIN WITH MINERALS) TABS tablet Take 1 tablet by mouth daily.    . potassium chloride (K-DUR) 10 MEQ tablet Take 1 tablet (10 mEq total) by mouth daily. 90 tablet 1  . polyethylene glycol (MIRALAX / GLYCOLAX) packet Take 17 g by mouth daily as needed.     No current facility-administered medications for this visit.     REVIEW OF SYSTEMS:   Constitutional: Denies fevers, chills or abnormal night sweats Eyes: Denies blurriness of vision, double vision  or watery eyes Ears, nose, mouth, throat, and face: Denies mucositis or sore throat Respiratory: Denies cough, dyspnea or wheezes Cardiovascular: Denies palpitation, chest discomfort or lower extremity swelling Gastrointestinal:  Denies nausea, heartburn or change in bowel habits Skin: Denies abnormal skin rashes Lymphatics: Denies new lymphadenopathy or easy bruising Neurological:Denies numbness, tingling or new weaknesses Behavioral/Psych: Mood is stable, no new changes  All other systems were reviewed with the patient and are negative.  PHYSICAL EXAMINATION: ECOG PERFORMANCE STATUS: 2-3  Vitals:   10/14/16 1410  BP: (!) 182/70  Pulse: (!) 58  Resp: 18  Temp: 97.6 F (36.4 C)   Filed Weights   10/14/16 1410  Weight: 197 lb 14.4 oz (89.8 kg)    GENERAL:alert, no distress and comfortable SKIN: skin color, texture, turgor are normal, no rashes or significant lesions except surgical change on right cheek and nose  EYES: normal, conjunctiva are pink and non-injected, sclera clear OROPHARYNX:no exudate, no erythema and lips, buccal mucosa, and tongue normal  NECK: supple, thyroid normal size, non-tender, without nodularity LYMPH:  no palpable lymphadenopathy in the cervical, axillary or inguinal LUNGS: clear to auscultation and percussion with normal breathing effort HEART: regular rate & rhythm and no murmurs and no lower extremity edema ABDOMEN:abdomen soft, non-tender and normal bowel sounds. No enlarged inguinal lymph nodes. He was recently had rectal exam by rad/onc, and declined rectal exam today PSYCH: alert & oriented x 3 with fluent speech.  NEURO: no focal motor/sensory deficits   LABORATORY DATA:  I have reviewed the data as listed CBC Latest Ref Rng & Units 10/14/2016 06/11/2016 02/12/2016  WBC 4.0 - 10.3 10e3/uL 5.2 4.5 -  Hemoglobin 13.0 - 17.1 g/dL 13.6 12.6(L) 12.2(L)  Hematocrit 38.4 - 49.9 % 42.4 38.2(L) 36.0(L)  Platelets 140 - 400 10e3/uL 201 219 -     Recent Labs  01/21/16 1131 02/06/16 0853 02/12/16 1131 06/11/16 0944 10/14/16 1133  NA 138 139 140 140 139  K 3.5 4.4 3.9 4.1 4.3  CL 104  --  103  --   --   CO2 25 26  --  27 28  GLUCOSE  120* 114 95 101 88  BUN 23* 15.7 11 14.9 17.0  CREATININE 0.93 0.8 0.70 0.8 1.0  CALCIUM 8.6* 9.2  --  9.4 10.0  GFRNONAA >60  --   --   --   --   GFRAA >60  --   --   --   --   PROT 6.7 6.4  --  6.8 6.9  ALBUMIN 3.7 2.9*  --  3.2* 3.8  AST 46* 16  --  16 26  ALT 23 15  --  12 20  ALKPHOS 75 109  --  130 103  BILITOT 1.5* 0.41  --  0.44 0.58   ReviewedResult NoteView in In Basket  CEA  Order: [id number]  Status:  Final result Visible to patient:  No (Not Released) Next appt:  01/25/2017 at 09:30 AM in Cardiology Nathaniel Merle, NP)    Ref Range & Units 3d ago 46mo ago   CEA 0.0 - 4.7 ng/mL 5.3   3.4CM         PATHOLOGY REPORT  Diagnosis 07/16/2015  Rectum, biopsy, mass - INVASIVE ADENOCARCINOMA WITH SIGNET RING CELL FEATURES. - LYMPHOVASCULAR INVASION IS IDENTIFIED. - SEE COMMENT. Microscopic Comment To help evaluate the case, immunohistochemistry stains were performed. The malignant cells are positive for CDX2 and cytokeratin AE1/AE3. There is focal staining for CD56, a neuroendocrine marker. Prostein and S-100 stains are negative. Overall, the features are consistent with invasive adenocarcinoma of colonic origin, with signet ring cell features. The case was discussed with Dr. Cristina [name] on 07/18/15. (JBK:ds 07/18/15)   RADIOGRAPHIC STUDIES: I have personally reviewed the radiological images as listed and agreed with the findings in the report. No results found.   Flexible  Sigmoidoscopy 07/16/2015 by Dr. Cristina [name]  impression , an infiltrative no obstructing large mass was found in the distal rectum. The mass was partially circumferential (involving one third of the lumen circumference ). The mass measured 5 cm in length.  Biopsy was obtained.  LOWER ENDOSCOPY [place]  08/08/2015  STAGING: T3 N1 Mx by endorectal ultrasound (limited study). ENDOSCOPIC IMPRESSION: Poor prep; unable to do colonoscopy. Rectal mass (adenocarcinoma on biopsies). Staging as above.  CT chest abdomen pelvis w contrast 02/07/2016 IMPRESSION: 1. Grossly stable anorectal wall thickening compared with previous study. No adjacent adenopathy or evidence of distant metastatic disease. 2. Multiple bladder calculi are again noted status post Foley catheter placement. In addition, there are multiple calculi within the urethra around the catheter. The prostate gland is moderately enlarged. 3. Moderate stool throughout the colon without evidence of obstruction. 4. Moderate atherosclerosis and severe left hip osteoarthritis again noted.   ASSESSMENT & PLAN:  83 year old Caucasian male, with past medical history of ulcerative colitis, hypertension and hypothyroidism, who lives in a independent living, presented with bowel habits change and rectal bleeding.  1. Low rectal adenocarcinoma, cT3N1M0, stage IIIB  -he is s/p concurrent chemotherapy and radiation, tolerated very well overall. His symptoms improved after treatment. -He declined surgery due to his advanced age and long recovery from surgery -I discussed that his rectal cancer is unlikely cured by neoadjuvant chemotherapy and radiation alone. He will likely grow again without surgical resection, which can cause obstruction, bleeding and pain. He may need diverging colostomy down the road.  -Rectal stent placement may not be an option due to the distal location -I discussed his restaging CT scan from 02/2016, which showed a stable rectal wall thickening, no metastatic adenopathy or distant metastasis. -I will obtain the report of his repeated  flexible sigmoidoscopy by Dr. Alycia Rossetti, per pt ino significant residual tumor after chemo RT -He is clinically doing well,  physical exam was unremark. Lab results reviewed with him, his CEA  slightly increased today, will continue monitoring. Due to his advance age, I will defer his scan until he is symptomatic. , we'll continue observation -I will refer him to pelvic PT for his stool incontinence   2. Low back pain -secondary to spinal stenosis and rectal pain, It has resolved after he completed chemoirradiation  3. HTN, AF, arthritis -He will continue follow-up with his primary care physician -will watch his blood pressure and heart rate to closely during the concurrent chemoradiation.  4. Anemia -developed after chemoRT -resolved now, Hb 13.6 today   5. Skin cancer including melanoma  -He will continue to follow-up with his dermatologist   Plan -Continue observation and supportive care -pelvic PT referral for stool incontinence  -I'll see him back in 4 months with lab and exam.  All questions were answered. The patient knows to call the clinic with any problems, questions or concerns.  I spent 25 minutes counseling the patient face to face. The total time spent in the appointment was 30 minutes and more than 50% was on counseling.    Nathaniel Merle, MD 10/14/2016

## 2016-10-14 ENCOUNTER — Telehealth: Payer: Self-pay | Admitting: Hematology

## 2016-10-14 ENCOUNTER — Other Ambulatory Visit (HOSPITAL_BASED_OUTPATIENT_CLINIC_OR_DEPARTMENT_OTHER): Payer: Medicare Other

## 2016-10-14 ENCOUNTER — Ambulatory Visit (HOSPITAL_BASED_OUTPATIENT_CLINIC_OR_DEPARTMENT_OTHER): Payer: Medicare Other | Admitting: Hematology

## 2016-10-14 VITALS — BP 182/70 | HR 58 | Temp 97.6°F | Resp 18 | Wt 197.9 lb

## 2016-10-14 DIAGNOSIS — C20 Malignant neoplasm of rectum: Secondary | ICD-10-CM

## 2016-10-14 DIAGNOSIS — I10 Essential (primary) hypertension: Secondary | ICD-10-CM | POA: Diagnosis not present

## 2016-10-14 DIAGNOSIS — C439 Malignant melanoma of skin, unspecified: Secondary | ICD-10-CM

## 2016-10-14 DIAGNOSIS — M1990 Unspecified osteoarthritis, unspecified site: Secondary | ICD-10-CM | POA: Diagnosis not present

## 2016-10-14 DIAGNOSIS — I4891 Unspecified atrial fibrillation: Secondary | ICD-10-CM | POA: Diagnosis not present

## 2016-10-14 DIAGNOSIS — D6481 Anemia due to antineoplastic chemotherapy: Secondary | ICD-10-CM

## 2016-10-14 DIAGNOSIS — I480 Paroxysmal atrial fibrillation: Secondary | ICD-10-CM

## 2016-10-14 DIAGNOSIS — T451X5A Adverse effect of antineoplastic and immunosuppressive drugs, initial encounter: Secondary | ICD-10-CM

## 2016-10-14 LAB — CBC WITH DIFFERENTIAL/PLATELET
BASO%: 0.6 % (ref 0.0–2.0)
BASOS ABS: 0 10*3/uL (ref 0.0–0.1)
EOS ABS: 0.1 10*3/uL (ref 0.0–0.5)
EOS%: 2.3 % (ref 0.0–7.0)
HCT: 42.4 % (ref 38.4–49.9)
HGB: 13.6 g/dL (ref 13.0–17.1)
LYMPH%: 19 % (ref 14.0–49.0)
MCH: 31.5 pg (ref 27.2–33.4)
MCHC: 32.1 g/dL (ref 32.0–36.0)
MCV: 98.1 fL — AB (ref 79.3–98.0)
MONO#: 0.8 10*3/uL (ref 0.1–0.9)
MONO%: 14.8 % — ABNORMAL HIGH (ref 0.0–14.0)
NEUT%: 63.3 % (ref 39.0–75.0)
NEUTROS ABS: 3.3 10*3/uL (ref 1.5–6.5)
PLATELETS: 201 10*3/uL (ref 140–400)
RBC: 4.32 10*6/uL (ref 4.20–5.82)
RDW: 14.3 % (ref 11.0–14.6)
WBC: 5.2 10*3/uL (ref 4.0–10.3)
lymph#: 1 10*3/uL (ref 0.9–3.3)

## 2016-10-14 LAB — COMPREHENSIVE METABOLIC PANEL
ALT: 20 U/L (ref 0–55)
ANION GAP: 8 meq/L (ref 3–11)
AST: 26 U/L (ref 5–34)
Albumin: 3.8 g/dL (ref 3.5–5.0)
Alkaline Phosphatase: 103 U/L (ref 40–150)
BUN: 17 mg/dL (ref 7.0–26.0)
CALCIUM: 10 mg/dL (ref 8.4–10.4)
CHLORIDE: 103 meq/L (ref 98–109)
CO2: 28 mEq/L (ref 22–29)
CREATININE: 1 mg/dL (ref 0.7–1.3)
EGFR: 67 mL/min/{1.73_m2} — AB (ref >90)
Glucose: 88 mg/dl (ref 70–140)
Potassium: 4.3 mEq/L (ref 3.5–5.1)
Sodium: 139 mEq/L (ref 136–145)
Total Bilirubin: 0.58 mg/dL (ref 0.20–1.20)
Total Protein: 6.9 g/dL (ref 6.4–8.3)

## 2016-10-14 LAB — CEA (IN HOUSE-CHCC): CEA (CHCC-In House): 5.9 ng/mL — ABNORMAL HIGH (ref 0.00–5.00)

## 2016-10-14 NOTE — Telephone Encounter (Signed)
Appointments scheduled per, 10/15/16 los. Patient was given a copy of the AVS report and appointment schedule, per 10/15/16 los.

## 2016-10-15 LAB — CEA: CEA1: 5.3 ng/mL — AB (ref 0.0–4.7)

## 2016-10-17 ENCOUNTER — Encounter: Payer: Self-pay | Admitting: Hematology

## 2016-11-04 ENCOUNTER — Encounter: Payer: Self-pay | Admitting: Physical Therapy

## 2016-11-04 ENCOUNTER — Ambulatory Visit: Payer: Medicare Other | Attending: Hematology | Admitting: Physical Therapy

## 2016-11-04 DIAGNOSIS — R279 Unspecified lack of coordination: Secondary | ICD-10-CM | POA: Diagnosis not present

## 2016-11-04 DIAGNOSIS — M6281 Muscle weakness (generalized): Secondary | ICD-10-CM | POA: Diagnosis not present

## 2016-11-04 NOTE — Patient Instructions (Addendum)
Slow Contraction: Gravity Eliminated (Side-Lying)    Lie on left side, hips and knees slightly bent. Slowly squeeze pelvic floor for _2__ seconds. Rest for _2__ seconds. Repeat _10__ times. Do __4_ times a day.  Make sure you feel the penis move.  Copyright  VHI. All rights reserved.   [place] [address], [place],  [address] Phone # [phone] Fax [phone]

## 2016-11-04 NOTE — Therapy (Signed)
[facility] [address] [place], [place], [address] Phone: [phone]   Fax:  [phone]  Physical Therapy Treatment  Patient Details  Name: Nathaniel [name] MRN: [id number] Date of Birth: 11-20-1919 Referring Provider: Dr. [name]  Encounter Date: 11/04/2016      PT End of Session - 11/04/16 1158    Visit Number 1   Number of Visits 10   Date for PT Re-Evaluation 12/30/16   Authorization Type Medicare G-code on 10th visit; KX modifier on 15th   PT Start Time 0845   PT Stop Time 0930   PT Time Calculation (min) 45 min   Activity Tolerance Patient tolerated treatment well   Behavior During Therapy [facility] for tasks assessed/performed      Past Medical History:  Diagnosis Date  . Anemia due to antineoplastic chemotherapy   . Atherosclerosis of coronary artery cardiologist-  dr Aundra Dubin   aorta and coronary arteries moderate per ct 02-07-2016  . Bladder calculi   . BPH (benign prostatic hyperplasia)   . Calculi, urethra   . Chronic low back pain   . Constipation   . ED (erectile dysfunction)   . Edema of lower extremity   . Foley catheter in place   . History of adenomatous polyp of colon    2007  . History of basal cell carcinoma excision    multiple  . History of cellulitis    left lower leg-- resolved  . History of melanoma excision    temple right side/ multiple  . History of obstructive sleep apnea    per pt cured by hypnosis  . History of pericarditis    acute episode 09/ 2014  per cardiologist note , dr Aundra Dubin , related possibly to xarelto use  . History of ulcerative colitis    remote  . HTN (hypertension)   . Hypercholesteremia   . Hypothyroidism   . Incomplete right bundle branch block   . LAFB (left anterior fascicular block)   . OA (osteoarthritis)   . PAF (paroxysmal atrial fibrillation) [facility]) cardiologist-  dr dalton Aundra Dubin   dx 09/ 2014  in setting of acute pericarditis   . Rectal  cancer [facility]) dx 07-16-2015  oncologist-  dr [name]   Stage IIIB (cT3, N1, M0)-- concurrent  chemotherapy started 08-12-2015/  radiation complete (08-12-2015 to 09-20-2015)  . S/P radiation therapy 08/12/15-09/20/15   rectal ca50.4Gy total dose  . Spinal stenosis     Past Surgical History:  Procedure Laterality Date  . CATARACT EXTRACTION W/ INTRAOCULAR LENS IMPLANT Bilateral 10/ 2016  . CYSTOSCOPY WITH LITHOLAPAXY N/A 02/12/2016   Procedure: CYSTOSCOPY WITH LITHOLAPAXY;  Surgeon: Alexis Frock, MD;  Location: [facility];  Service: Urology;  Laterality: N/A;  . EUS N/A 08/08/2015   Procedure: LOWER ENDOSCOPIC ULTRASOUND (EUS);  Surgeon: Arta Silence, MD;  Location: [facility] ENDOSCOPY;  Service: Endoscopy;  Laterality: N/A;  . FLEXIBLE SIGMOIDOSCOPY N/A 08/08/2015   Procedure: FLEXIBLE SIGMOIDOSCOPY;  Surgeon: Arta Silence, MD;  Location: [facility] ENDOSCOPY;  Service: Endoscopy;  Laterality: N/A;  poor prep  . HOLMIUM LASER APPLICATION N/A [id number]   Procedure: HOLMIUM LASER APPLICATION;  Surgeon: Alexis Frock, MD;  Location: [facility];  Service: Urology;  Laterality: N/A;  . INGUINAL HERNIA REPAIR Left 1965  . LEFT KNEE SURGERY  1940  . MOHS SURGERY  x9  last one 2014   . ORIF LEFT ANKLE FX  1935  . ROTATOR CUFF REPAIR Bilateral right  2013/  left 2010  . TRANSTHORACIC ECHOCARDIOGRAM  06-06-2013   ef 55-60%/  mild AR/  mild dilated aortic root/  mild LAE and RAE/  mild to moderate TR/  small pericardial effusion identified    There were no vitals filed for this visit.      Subjective Assessment - 11/04/16 0901    Subjective I go for 3 days with bowel movements and then I may not have a bowel movement for 3 days.  This is a pattern I have had all my life. I did not have bowel incontinence prior to rectal cancer. I had several stones removed, I began to have urinary incontinence. When I have the urge for a bowel movement, I need to get to the commode quickly.     Patient Stated Goals reduce leakage of bowels and urinary   Currently in Pain? No/denies            [facility] PT Assessment - 11/04/16 0001      Assessment   Medical Diagnosis C20 rectal cancer   Referring Provider Dr. [name]   Onset Date/Surgical Date 08/11/16   Prior Therapy None     Precautions   Precautions Other (comment)   Precaution Comments rectal cancer     Restrictions   Weight Bearing Restrictions No     Balance Screen   Has the patient fallen in the past 6 months No   Has the patient had a decrease in activity level because of a fear of falling?  No   Is the patient reluctant to leave their home because of a fear of falling?  No     Home Environment   Living Environment Assisted living  [place] - 4 wheels  with seat     Prior Function   Level of Independence Independent   Vocation Retired     Associate Professor   Overall Cognitive Status Within Functional Limits for tasks assessed     Observation/Other Assessments   Focus on Therapeutic Outcomes (FOTO)  58% limitation  goal is 45% limitation     Posture/Postural Control   Posture/Postural Control Postural limitations   Postural Limitations Rounded Shoulders;Forward head;Decreased lumbar lordosis   Posture Comments Patient has spinal stenosis     ROM / Strength   AROM / PROM / Strength AROM;Strength     AROM   Overall AROM Comments left hip ROM decreased by 75%     Strength   Overall Strength Comments left hip abduction and adduction 3+/5     Ambulation/Gait   Ambulation/Gait Yes   Assistive device 4-wheeled walker  with seat   Gait Pattern Decreased hip/knee flexion - left;Decreased stance time - left                  Pelvic Floor Special Questions - 11/04/16 0001    Urinary Leakage Yes   Pad use depend 4-6 per day  at night changes depends 2 times   Urinary urgency --  stool urgency   Urinary frequency --  does not have feeling the urine coming out    Fecal incontinence Yes   Fluid intake drinks 6-7 glasses of water   Caffeine beverages 1 cup coffee   Skin Integrity Intact   Pelvic Floor Internal Exam Patient confirms identification and approves PT to assess muscle strength   Exam Type Rectal  sidely   Palpation after verbal cues and tactile cues patient was able to contract external sphincter with slight  tug of the penis   Strength Flicker                   PT Education - 11/04/16 1157    Education provided Yes   Education Details sidely pelvic floor contraction   Person(s) Educated Patient   Methods Explanation;Demonstration;Verbal cues;Handout;Tactile cues   Comprehension Verbalized understanding;Returned demonstration          PT Short Term Goals - 11/04/16 1209      PT SHORT TERM GOAL #1   Title independent with initial HEP   Time 4   Period Weeks   Status New     PT SHORT TERM GOAL #2   Title pelvic floor contraction is 2/5 with slight lift   Time 4   Period Weeks   Status New     PT SHORT TERM GOAL #3   Title ability to contract abdominals with pelvic floor contraction to work on continence   Time 4   Period Weeks   Status New     PT SHORT TERM GOAL #4   Title fecal leakage decreased >/= 25% due to increased pelvic floor control   Time 4   Period Weeks   Status New           PT Long Term Goals - 11/04/16 [id number]      PT LONG TERM GOAL #1   Title independent with HEP for pelvic floor strengthening   Time 8   Period Weeks   Status New     PT LONG TERM GOAL #2   Title fecal incontinence decreased >/= 60% due to increased pelvic floor strength   Time 8   Period Weeks   Status New     PT LONG TERM GOAL #3   Title ability to be aware of himself leaking urine due to improved muscle control   Time 8   Period Weeks   Status New     PT LONG TERM GOAL #4   Title FOTO score </= 45% limitation   Time 8   Period Weeks   Status New     PT LONG TERM GOAL #5   Title abominal strength  and pelvic floor strength is >/= 3/5 to improve continence   Time 8   Period Weeks   Status New               Plan - 11/04/16 1159    Clinical Impression Statement Patient is a 83 year old male s/p rectal cancer with chemotherapy and radiation treatment.  Patient was diagnosis of rectal cancer on 07/23/2015.  Patient had chemotherapy and radiation from 08/12/2015 to 09/20/2015.  After treatment, patient had stool leakage. Patient reports urinary incontinence after her had stones removed from his kidneys in 2016.  Patient has to wear depends that he changes 5 times per day and 2 times during the night.  Patient does not feel himself losing urine. Pelvic floor strength is 1/5 after he had tactile cues and verbal cues on correct pelvic floor contraction anally.  Initially, patient was not able to contract the external and internal sphinter.  Patient posture includes forward head, rounded shoulders, flexed trunk.  Abdominal strength is 1/5.  Patient is moderately complex evaluation due to an evolving condition and comorbidiites including enlarged prostate, s/P rectal cancer with radition and chemotherapy treatment, and poor posture that will impact care provided. Patient will benefit from skilled therapy to reduce fecal leakage while strengthening the pelvic floor.  Rehab Potential Good   Clinical Impairments Affecting Rehab Potential s/p rectal cancer with chemotherapy and radiation for treatment; enlarged prostate   PT Frequency 1x / week   PT Duration 8 weeks   PT Treatment/Interventions Biofeedback;Therapeutic activities;Therapeutic exercise;Neuromuscular re-education;Patient/family education;Manual techniques   PT Next Visit Plan hold pelvic floor contraction for 3-5 sec in sidely; engage pelvic floor contraction in sitting, abdominal bracing ;diaphgramatic breathing   PT Home Exercise Plan progress as needed   Recommended Other Services none   Consulted and Agree with Plan of Care Patient       Patient will benefit from skilled therapeutic intervention in order to improve the following deficits and impairments:  Decreased coordination, Increased fascial restricitons, Decreased strength, Decreased activity tolerance, Decreased endurance  Visit Diagnosis: Muscle weakness (generalized) - Plan: PT plan of care cert/re-cert  Unspecified lack of coordination - Plan: PT plan of care cert/re-cert       G-Codes - Dec 04, 2016 0935    Functional Assessment Tool Used (Outpatient Only) FOTO score is 58% limitation  goal is 45% limitation   Functional Limitation Other PT primary   Other PT Primary Current Status [id number]) At least 40 percent but less than 60 percent impaired, limited or restricted   Other PT Primary Goal Status [id number]) At least 40 percent but less than 60 percent impaired, limited or restricted      Problem List Patient Active Problem List   Diagnosis Date Noted  . Anemia due to antineoplastic chemotherapy 09/18/2015  . Encounter for chemotherapy management 07/29/2015  . Rectal cancer ([place]) 07/23/2015  . Acute pericarditis 06/08/2013  . Pericardial effusion 05/23/2013  . Near syncope 05/22/2013  . Diarrhea 05/22/2013  . Orthostatic hypotension 05/22/2013  . Atrial fibrillation ([place]) 05/12/2013  . Chest pain 05/11/2013  . HTN (hypertension) 05/11/2013    Earlie Counts, PT 12/04/16 12:15 PM   [facility] [address], [place], [place], [address] Phone: [phone]   Fax:  [phone]  Name: Nathaniel [name] MRN: [id number] Date of Birth: [birth year]

## 2016-11-05 DIAGNOSIS — C20 Malignant neoplasm of rectum: Secondary | ICD-10-CM | POA: Diagnosis not present

## 2016-11-05 DIAGNOSIS — I10 Essential (primary) hypertension: Secondary | ICD-10-CM | POA: Diagnosis not present

## 2016-11-05 DIAGNOSIS — E782 Mixed hyperlipidemia: Secondary | ICD-10-CM | POA: Diagnosis not present

## 2016-11-05 DIAGNOSIS — Z86008 Personal history of in-situ neoplasm of other site: Secondary | ICD-10-CM | POA: Diagnosis not present

## 2016-11-05 DIAGNOSIS — E039 Hypothyroidism, unspecified: Secondary | ICD-10-CM | POA: Diagnosis not present

## 2016-11-05 DIAGNOSIS — R7301 Impaired fasting glucose: Secondary | ICD-10-CM | POA: Diagnosis not present

## 2016-11-09 DIAGNOSIS — L821 Other seborrheic keratosis: Secondary | ICD-10-CM | POA: Diagnosis not present

## 2016-11-09 DIAGNOSIS — D1801 Hemangioma of skin and subcutaneous tissue: Secondary | ICD-10-CM | POA: Diagnosis not present

## 2016-11-09 DIAGNOSIS — L570 Actinic keratosis: Secondary | ICD-10-CM | POA: Diagnosis not present

## 2016-11-09 DIAGNOSIS — Z86008 Personal history of in-situ neoplasm of other site: Secondary | ICD-10-CM | POA: Diagnosis not present

## 2016-11-09 DIAGNOSIS — Z85828 Personal history of other malignant neoplasm of skin: Secondary | ICD-10-CM | POA: Diagnosis not present

## 2016-11-09 DIAGNOSIS — Z86018 Personal history of other benign neoplasm: Secondary | ICD-10-CM | POA: Diagnosis not present

## 2016-11-09 DIAGNOSIS — L814 Other melanin hyperpigmentation: Secondary | ICD-10-CM | POA: Diagnosis not present

## 2016-11-09 DIAGNOSIS — Z23 Encounter for immunization: Secondary | ICD-10-CM | POA: Diagnosis not present

## 2016-11-11 ENCOUNTER — Encounter: Payer: Self-pay | Admitting: Physical Therapy

## 2016-11-11 ENCOUNTER — Ambulatory Visit: Payer: Medicare Other | Attending: Hematology | Admitting: Physical Therapy

## 2016-11-11 DIAGNOSIS — M6281 Muscle weakness (generalized): Secondary | ICD-10-CM | POA: Diagnosis not present

## 2016-11-11 DIAGNOSIS — R279 Unspecified lack of coordination: Secondary | ICD-10-CM | POA: Diagnosis not present

## 2016-11-11 DIAGNOSIS — C20 Malignant neoplasm of rectum: Secondary | ICD-10-CM | POA: Diagnosis not present

## 2016-11-11 NOTE — Therapy (Signed)
[facility] [address] [place], [place], [address] Phone: [phone]   Fax:  [phone]  Physical Therapy Treatment  Patient Details  Name: Nathaniel [name] MRN: [id number] Date of Birth: Oct 24, 1919 Referring Provider: Dr. [name]  Encounter Date: 11/11/2016      PT End of Session - 11/11/16 0939    Visit Number 2   Number of Visits 10   Date for PT Re-Evaluation 12/30/16   Authorization Type Medicare G-code on 10th visit; KX modifier on 15th   PT Start Time 0930   PT Stop Time 1010   PT Time Calculation (min) 40 min   Activity Tolerance Patient tolerated treatment well   Behavior During Therapy [facility] for tasks assessed/performed      Past Medical History:  Diagnosis Date  . Anemia due to antineoplastic chemotherapy   . Atherosclerosis of coronary artery cardiologist-  dr Aundra Dubin   aorta and coronary arteries moderate per ct 02-07-2016  . Bladder calculi   . BPH (benign prostatic hyperplasia)   . Calculi, urethra   . Chronic low back pain   . Constipation   . ED (erectile dysfunction)   . Edema of lower extremity   . Foley catheter in place   . History of adenomatous polyp of colon    2007  . History of basal cell carcinoma excision    multiple  . History of cellulitis    left lower leg-- resolved  . History of melanoma excision    temple right side/ multiple  . History of obstructive sleep apnea    per pt cured by hypnosis  . History of pericarditis    acute episode 09/ 2014  per cardiologist note , dr Aundra Dubin , related possibly to xarelto use  . History of ulcerative colitis    remote  . HTN (hypertension)   . Hypercholesteremia   . Hypothyroidism   . Incomplete right bundle branch block   . LAFB (left anterior fascicular block)   . OA (osteoarthritis)   . PAF (paroxysmal atrial fibrillation) [facility]) cardiologist-  dr dalton Aundra Dubin   dx 09/ 2014  in setting of acute pericarditis   . Rectal  cancer [facility]) dx 07-16-2015  oncologist-  dr [name]   Stage IIIB (cT3, N1, M0)-- concurrent  chemotherapy started 08-12-2015/  radiation complete (08-12-2015 to 09-20-2015)  . S/P radiation therapy 08/12/15-09/20/15   rectal ca50.4Gy total dose  . Spinal stenosis     Past Surgical History:  Procedure Laterality Date  . CATARACT EXTRACTION W/ INTRAOCULAR LENS IMPLANT Bilateral 10/ 2016  . CYSTOSCOPY WITH LITHOLAPAXY N/A 02/12/2016   Procedure: CYSTOSCOPY WITH LITHOLAPAXY;  Surgeon: Alexis Frock, MD;  Location: [facility];  Service: Urology;  Laterality: N/A;  . EUS N/A 08/08/2015   Procedure: LOWER ENDOSCOPIC ULTRASOUND (EUS);  Surgeon: Arta Silence, MD;  Location: [facility] ENDOSCOPY;  Service: Endoscopy;  Laterality: N/A;  . FLEXIBLE SIGMOIDOSCOPY N/A 08/08/2015   Procedure: FLEXIBLE SIGMOIDOSCOPY;  Surgeon: Arta Silence, MD;  Location: [facility] END[facility]COPY;  Service: Endoscopy;  Laterality: N/A;  poor prep  . HOLMIUM LASER APPLICATION N/A 03/09/2201   Procedure: HOLMIUM LASER APPLICATION;  Surgeon: Alexis Frock, MD;  Location: [facility];  Service: Urology;  Laterality: N/A;  . INGUINAL HERNIA REPAIR Left 1965  . LEFT KNEE SURGERY  1940  . MOHS SURGERY  x9  last one 2014   . ORIF LEFT ANKLE FX  1935  . ROTATOR CUFF REPAIR Bilateral right  2013/  left 2010  . TRANSTHORACIC ECHOCARDIOGRAM  06-06-2013   ef 55-60%/  mild AR/  mild dilated aortic root/  mild LAE and RAE/  mild to moderate TR/  small pericardial effusion identified    There were no vitals filed for this visit.      Subjective Assessment - 11/11/16 0936    Subjective I have been doing the exercises.  No change with bowel urgency.    Patient Stated Goals reduce leakage of bowels and urinary   Currently in Pain? No/denies   Multiple Pain Sites No                         [facility] Adult PT Treatment/Exercise - 11/11/16 0001      Manual Therapy   Manual Therapy Soft tissue  mobilization;Myofascial release   Soft tissue mobilization circular massage to abdomen to promote peralstalic movement of the intestine   Myofascial Release release of the bladder from the rectum through the sac of douglas                PT Education - 11/11/16 1010    Education provided Yes   Education Details abdominal contraction; lower rib cage breathing; abdominal massage   Person(s) Educated Patient   Methods Explanation;Demonstration;Verbal cues;Handout   Comprehension Returned demonstration;Verbalized understanding          PT Short Term Goals - 11/11/16 1015      PT SHORT TERM GOAL #1   Title independent with initial HEP   Time 4   Period Weeks   Status On-going     PT SHORT TERM GOAL #2   Title pelvic floor contraction is 2/5 with slight lift   Time 4   Period Weeks   Status On-going  no changes yet     PT SHORT TERM GOAL #3   Title ability to contract abdominals with pelvic floor contraction to work on continence   Time 4   Period Weeks   Status On-going  jsut learned     PT SHORT TERM GOAL #4   Title fecal leakage decreased >/= 25% due to increased pelvic floor control   Time 4   Period Weeks   Status On-going  just started           PT Long Term Goals - 11/04/16 5361      PT LONG TERM GOAL #1   Title independent with HEP for pelvic floor strengthening   Time 8   Period Weeks   Status New     PT LONG TERM GOAL #2   Title fecal incontinence decreased >/= 60% due to increased pelvic floor strength   Time 8   Period Weeks   Status New     PT LONG TERM GOAL #3   Title ability to be aware of himself leaking urine due to improved muscle control   Time 8   Period Weeks   Status New     PT LONG TERM GOAL #4   Title FOTO score </= 45% limitation   Time 8   Period Weeks   Status New     PT LONG TERM GOAL #5   Title abominal strength and pelvic floor strength is >/= 3/5 to improve continence   Time 8   Period Weeks   Status New                Plan - 11/11/16 1011    Clinical Impression Statement  Patient learned abdominal massage to promote bowel movement due to not having one in several days. Patient needs a towel to criss cross to engage the lower abdominals due to barely a contraction.  When patient breathes his upper abdomen with contract and then lower belly becomes a beach ball.  Patient has learned how to expand the upper abdominals and trying to contract the lower abdomens.  Patient needs education on abdominal contraction with transitional movements. Patient will benefit from skilled therapy to reduce fecal leakage while strengthening the pelvic floor.    Rehab Potential Good   Clinical Impairments Affecting Rehab Potential s/p rectal cancer with chemotherapy and radiation for treatment; enlarged prostate   PT Frequency 1x / week   PT Duration 8 weeks   PT Treatment/Interventions Biofeedback;Therapeutic activities;Therapeutic exercise;Neuromuscular re-education;Patient/family education;Manual techniques   PT Next Visit Plan hold pelvic floor contraction for 3-5 sec in sidely; engage pelvic floor contraction in sitting, abdominal bracing    PT Home Exercise Plan progress as needed   Consulted and Agree with Plan of Care Patient      Patient will benefit from skilled therapeutic intervention in order to improve the following deficits and impairments:  Decreased coordination, Increased fascial restricitons, Decreased strength, Decreased activity tolerance, Decreased endurance  Visit Diagnosis: Muscle weakness (generalized)  Unspecified lack of coordination  Rectal cancer [facility])     Problem List Patient Active Problem List   Diagnosis Date Noted  . Anemia due to antineoplastic chemotherapy 09/18/2015  . Encounter for chemotherapy management 07/29/2015  . Rectal cancer ([place]) 07/23/2015  . Acute pericarditis 06/08/2013  . Pericardial effusion 05/23/2013  . Near syncope 05/22/2013  . Diarrhea  05/22/2013  . Orthostatic hypotension 05/22/2013  . Atrial fibrillation ([place]) 05/12/2013  . Chest pain 05/11/2013  . HTN (hypertension) 05/11/2013    Earlie Counts, PT 11/11/16 10:17 AM   [facility] [address], [place], [place], [address] Phone: [phone]   Fax:  [phone]  Name: EMMONS TOTH MRN: [id number] Date of Birth: 27-Sep-1919

## 2016-11-11 NOTE — Patient Instructions (Addendum)
About Abdominal Massage  Abdominal massage, also called external colon massage, is a self-treatment circular massage technique that can reduce and eliminate gas and ease constipation. The colon naturally contracts in waves in a clockwise direction starting from inside the right hip, moving up toward the ribs, across the belly, and down inside the left hip.  When you perform circular abdominal massage, you help stimulate your colon's normal wave pattern of movement called peristalsis.  It is most beneficial when done after eating.  Positioning You can practice abdominal massage with oil while lying down, or in the shower with soap.  Some people find that it is just as effective to do the massage through clothing while sitting or standing.  How to Massage Start by placing your finger tips or knuckles on your right side, just inside your hip bone.  . Make small circular movements while you move upward toward your rib cage.   . Once you reach the bottom right side of your rib cage, take your circular movements across to the left side of the bottom of your rib cage.  . Next, move downward until you reach the inside of your left hip bone.  This is the path your feces travel in your colon. . Continue to perform your abdominal massage in this pattern for 10 minutes each day.     You can apply as much pressure as is comfortable in your massage.  Start gently and build pressure as you continue to practice.  Notice any areas of pain as you massage; areas of slight pain may be relieved as you massage, but if you have areas of significant or intense pain, consult with your healthcare provider.  Other Considerations . General physical activity including bending and stretching can have a beneficial massage-like effect on the colon.  Deep breathing can also stimulate the colon because breathing deeply activates the same nervous system that supplies the colon.   . Abdominal massage should always be used in  combination with a bowel-conscious diet that is high in the proper type of fiber for you, fluids (primarily water), and a regular exercise program.  Hook-Lying    Lie with hips and knees bent. Allow body's muscles to relax. Place hands on on low rib cage. Inhale slowly and deeply for _3__ seconds, so hands move up. Then take __3_ seconds to exhale. Repeat _10__ times. Do _2__ times a day.  Trying to expand the lower rib cage and diaphragm. Copyright  VHI. All rights reserved.   Diastasis Recti Correction With Towel (Hook-Lying)    Loop long towel or sheet under back and criss-cross over lower abdomen. Inhale. In one fluid movement: Pull in navel, pull towel tight across abdomen, exhale,  and Hold for ___ seconds. Return, rest for _5__ seconds. Repeat _10__ times. Do _3__ times a day.   Copyright  VHI. All rights reserved.  [facility] [address], [place], [place] [address] Phone # [phone] Fax [phone]

## 2016-11-18 ENCOUNTER — Encounter: Payer: Self-pay | Admitting: Physical Therapy

## 2016-11-18 ENCOUNTER — Ambulatory Visit: Payer: Medicare Other | Admitting: Physical Therapy

## 2016-11-18 DIAGNOSIS — R279 Unspecified lack of coordination: Secondary | ICD-10-CM

## 2016-11-18 DIAGNOSIS — C20 Malignant neoplasm of rectum: Secondary | ICD-10-CM | POA: Diagnosis not present

## 2016-11-18 DIAGNOSIS — M6281 Muscle weakness (generalized): Secondary | ICD-10-CM

## 2016-11-18 NOTE — Therapy (Signed)
[facility] [address] [place], [place], [address] Phone: [phone]   Fax:  [phone]  Physical Therapy Treatment  Patient Details  Name: Nathaniel [name] MRN: [id number] Date of Birth: [birth year] Referring Provider: Dr. [name]  Encounter Date: 11/18/2016      PT End of Session - 11/18/16 0918    Visit Number 3   Number of Visits 10   Date for PT Re-Evaluation 12/30/16   Authorization Type Medicare G-code on 10th visit; KX modifier on 15th   PT Start Time 0845   PT Stop Time 0930   PT Time Calculation (min) 45 min   Activity Tolerance Patient tolerated treatment well   Behavior During Therapy [facility] for tasks assessed/performed      Past Medical History:  Diagnosis Date  . Anemia due to antineoplastic chemotherapy   . Atherosclerosis of coronary artery cardiologist-  dr Aundra Dubin   aorta and coronary arteries moderate per ct 02-07-2016  . Bladder calculi   . BPH (benign prostatic hyperplasia)   . Calculi, urethra   . Chronic low back pain   . Constipation   . ED (erectile dysfunction)   . Edema of lower extremity   . Foley catheter in place   . History of adenomatous polyp of colon    2007  . History of basal cell carcinoma excision    multiple  . History of cellulitis    left lower leg-- resolved  . History of melanoma excision    temple right side/ multiple  . History of obstructive sleep apnea    per pt cured by hypnosis  . History of pericarditis    acute episode 09/ 2014  per cardiologist note , dr Aundra Dubin , related possibly to xarelto use  . History of ulcerative colitis    remote  . HTN (hypertension)   . Hypercholesteremia   . Hypothyroidism   . Incomplete right bundle branch block   . LAFB (left anterior fascicular block)   . OA (osteoarthritis)   . PAF (paroxysmal atrial fibrillation) [facility]) cardiologist-  dr dalton Aundra Dubin   dx 09/ 2014  in setting of acute pericarditis   . Rectal  cancer [facility]) dx 07-16-2015  oncologist-  dr [name]   Stage IIIB (cT3, N1, M0)-- concurrent  chemotherapy started 08-12-2015/  radiation complete (08-12-2015 to 09-20-2015)  . S/P radiation therapy 08/12/15-09/20/15   rectal ca50.4Gy total dose  . Spinal stenosis     Past Surgical History:  Procedure Laterality Date  . CATARACT EXTRACTION W/ INTRAOCULAR LENS IMPLANT Bilateral 10/ 2016  . CYSTOSCOPY WITH LITHOLAPAXY N/A 02/12/2016   Procedure: CYSTOSCOPY WITH LITHOLAPAXY;  Surgeon: Alexis Frock, MD;  Location: [facility];  Service: Urology;  Laterality: N/A;  . EUS N/A 08/08/2015   Procedure: LOWER ENDOSCOPIC ULTRASOUND (EUS);  Surgeon: Arta Silence, MD;  Location: [facility] ENDOSCOPY;  Service: Endoscopy;  Laterality: N/A;  . FLEXIBLE SIGMOIDOSCOPY N/A 08/08/2015   Procedure: FLEXIBLE SIGMOIDOSCOPY;  Surgeon: Arta Silence, MD;  Location: [facility] ENDOSCOPY;  Service: Endoscopy;  Laterality: N/A;  poor prep  . HOLMIUM LASER APPLICATION N/A 02/10/4402   Procedure: HOLMIUM LASER APPLICATION;  Surgeon: Alexis Frock, MD;  [facility];  Service: Urology;  Laterality: N/A;  . INGUINAL HERNIA REPAIR Left 1965  . LEFT KNEE SURGERY  1940  . MOHS SURGERY  x9  last one 2014   . ORIF LEFT ANKLE FX  1935  . ROTATOR CUFF REPAIR Bilateral right  2013/  left 2010  . TRANSTHORACIC ECHOCARDIOGRAM  06-06-2013   ef 55-60%/  mild AR/  mild dilated aortic root/  mild LAE and RAE/  mild to moderate TR/  small pericardial effusion identified    There were no vitals filed for this visit.      Subjective Assessment - 11/18/16 0848    Subjective There was a change initially but no changes.  I did not get to exercise and had a cold.  I get dizzy when I sit up. When I get up in the middle of the night to urinate, I have to sit a moment due to being dizzy.    Patient Stated Goals reduce leakage of bowels and urinary   Currently in Pain? No/denies                       Pelvic Floor Special Questions - 11/18/16 0001    Biofeedback sidely with quick flicks and  contracting for 5 seconds; contract from left sidly to right sidely, right sidely to sit, sit to stand  sidely   Biofeedback sensor type Surface  rectal   Biofeedback Activity Quick contraction;10 second hold  above 4uv                   PT Education - 11/18/16 0918    Education provided Yes   Education Details pelvic floor contraction in sidely with pillow between knees   Person(s) Educated Patient   Methods Explanation;Demonstration;Verbal cues;Handout   Comprehension Verbalized understanding;Returned demonstration          PT Short Term Goals - 11/18/16 0850      PT SHORT TERM GOAL #1   Title independent with initial HEP   Time 4   Period Weeks   Status On-going     PT SHORT TERM GOAL #2   Title pelvic floor contraction is 2/5 with slight lift   Time 4   Period Weeks   Status On-going     PT SHORT TERM GOAL #3   Title ability to contract abdominals with pelvic floor contraction to work on continence   Time 4   Period Weeks   Status On-going     PT SHORT TERM GOAL #4   Title fecal leakage decreased >/= 25% due to increased pelvic floor control   Time 4   Period Weeks   Status On-going           PT Long Term Goals - 11/04/16 5284      PT LONG TERM GOAL #1   Title independent with HEP for pelvic floor strengthening   Time 8   Period Weeks   Status New     PT LONG TERM GOAL #2   Title fecal incontinence decreased >/= 60% due to increased pelvic floor strength   Time 8   Period Weeks   Status New     PT LONG TERM GOAL #3   Title ability to be aware of himself leaking urine due to improved muscle control   Time 8   Period Weeks   Status New     PT LONG TERM GOAL #4   Title FOTO score </= 45% limitation   Time 8   Period Weeks   Status New     PT LONG TERM GOAL #5   Title abominal strength and pelvic floor strength is >/= 3/5 to improve  continence   Time 8   Period Weeks   Status New  Plan - 11/18/16 0920    Clinical Impression Statement Patient resting level is 0.54 uv indicating low tone and needs to bulk up.  Patient is able to contract in sidely with ball between knees for 3-4 sec but has difficulty with controll and needs verbal cues to breath.  Patient is not ready for transitional movements or pelvic floor contraction in sitting.  Patient does better with pelvic floor EMG.  Patient is able to contract the pelvic flor going from sit to stand.  Patient will benefit from skilled therapy to imcrease pelvic floor controll and increase tone to decrease leakage.    Rehab Potential Good   Clinical Impairments Affecting Rehab Potential s/p rectal cancer with chemotherapy and radiation for treatment; enlarged prostate   PT Frequency 1x / week   PT Duration 8 weeks   PT Treatment/Interventions Biofeedback;Therapeutic activities;Therapeutic exercise;Neuromuscular re-education;Patient/family education;Manual techniques   PT Next Visit Plan hold pelvic floor contraction for 3-5 sec in sidely; engage pelvic floor contraction in sitting, abdominal bracing using the pelvic EMG   PT Home Exercise Plan progress as needed   Consulted and Agree with Plan of Care Patient      Patient will benefit from skilled therapeutic intervention in order to improve the following deficits and impairments:  Decreased coordination, Increased fascial restricitons, Decreased activity tolerance, Decreased endurance, Decreased strength  Visit Diagnosis: Muscle weakness (generalized)  Unspecified lack of coordination  Rectal cancer [facility])     Problem List Patient Active Problem List   Diagnosis Date Noted  . Anemia due to antineoplastic chemotherapy 09/18/2015  . Encounter for chemotherapy management 07/29/2015  . Rectal cancer ([place]) 07/23/2015  . Acute pericarditis 06/08/2013  . Pericardial effusion 05/23/2013  . Near  syncope 05/22/2013  . Diarrhea 05/22/2013  . Orthostatic hypotension 05/22/2013  . Atrial fibrillation ([place]) 05/12/2013  . Chest pain 05/11/2013  . HTN (hypertension) 05/11/2013    Earlie Counts, PT 11/18/16 9:39 AM   [facility] [address], [place], [place], [address] Phone: [phone]   Fax:  [phone]  Name: Nathaniel [name] MRN: [id number] Date of Birth: 1920/02/16

## 2016-11-18 NOTE — Patient Instructions (Addendum)
Adduction: Hip - Knees Together (Side-Lying)    Lie on left side, hips and knees slightly bent, towel roll between knees. Push knees together. Hold for _4__ seconds. Rest for __4_ seconds. Repeat _10__ times. Do __3_ times a day. Then you will do 10 quick flicks with pillow between knees.    Copyright  VHI. All rights reserved.  [facility] [address], [place], [place] [address] Phone # [phone] Fax [phone]

## 2016-11-25 ENCOUNTER — Encounter: Payer: Self-pay | Admitting: Physical Therapy

## 2016-11-25 ENCOUNTER — Ambulatory Visit: Payer: Medicare Other | Admitting: Physical Therapy

## 2016-11-25 DIAGNOSIS — M6281 Muscle weakness (generalized): Secondary | ICD-10-CM

## 2016-11-25 DIAGNOSIS — R279 Unspecified lack of coordination: Secondary | ICD-10-CM | POA: Diagnosis not present

## 2016-11-25 DIAGNOSIS — C20 Malignant neoplasm of rectum: Secondary | ICD-10-CM

## 2016-11-25 NOTE — Patient Instructions (Addendum)
Slow Contraction: Gravity Eliminated (Hook-Lying)    Lie with hips and knees bent. Breath in. Breath out as you tighten the pelvic floor and lower abdominal with squeezing towel between knees. Slowly squeeze pelvic floor for ___ seconds. Rest for ___ seconds. Repeat _10__ times. Do _2__ times a day.   Copyright  VHI. All rights reserved.   [facility] [address] [place], [place] [address] Phone # [phone] Fax [phone]

## 2016-11-25 NOTE — Therapy (Signed)
[facility] [address], [place], [place], [address] Phone: [phone]   Fax:  [phone]  Physical Therapy Treatment  Patient Details  Name: Nathaniel [name] MRN: [id number] Date of Birth: [birth year] Referring Provider: Dr. [name]  Encounter Date: 11/25/2016      PT End of Session - 11/25/16 0931    Visit Number 4   Number of Visits 10   Date for PT Re-Evaluation 12/30/16   Authorization Type Medicare G-code on 10th visit; KX modifier on 15th   PT Start Time 0845   PT Stop Time 0930   PT Time Calculation (min) 45 min   Activity Tolerance Patient tolerated treatment well   Behavior During Therapy [facility] for tasks assessed/performed      Past Medical History:  Diagnosis Date  . Anemia due to antineoplastic chemotherapy   . Atherosclerosis of coronary artery cardiologist-  dr Aundra Dubin   aorta and coronary arteries moderate per ct 02-07-2016  . Bladder calculi   . BPH (benign prostatic hyperplasia)   . Calculi, urethra   . Chronic low back pain   . Constipation   . ED (erectile dysfunction)   . Edema of lower extremity   . Foley catheter in place   . History of adenomatous polyp of colon    2007  . History of basal cell carcinoma excision    multiple  . History of cellulitis    left lower leg-- resolved  . History of melanoma excision    temple right side/ multiple  . History of obstructive sleep apnea    per pt cured by hypnosis  . History of pericarditis    acute episode 09/ 2014  per cardiologist note , dr Aundra Dubin , related possibly to xarelto use  . History of ulcerative colitis    remote  . HTN (hypertension)   . Hypercholesteremia   . Hypothyroidism   . Incomplete right bundle branch block   . LAFB (left anterior fascicular block)   . OA (osteoarthritis)   . PAF (paroxysmal atrial fibrillation) [facility]) cardiologist-  dr dalton Aundra Dubin   dx 09/ 2014  in setting of acute pericarditis   . Rectal  cancer [facility]) dx 07-16-2015  oncologist-  dr [name]   Stage IIIB (cT3, N1, M0)-- concurrent  chemotherapy started 08-12-2015/  radiation complete (08-12-2015 to 09-20-2015)  . S/P radiation therapy 08/12/15-09/20/15   rectal ca50.4Gy total dose  . Spinal stenosis     Past Surgical History:  Procedure Laterality Date  . CATARACT EXTRACTION W/ INTRAOCULAR LENS IMPLANT Bilateral 10/ 2016  . CYSTOSCOPY WITH LITHOLAPAXY N/A 02/12/2016   Procedure: CYSTOSCOPY WITH LITHOLAPAXY;  Surgeon: Alexis Frock, MD;  Location: [facility];  Service: Urology;  Laterality: N/A;  . EUS N/A 08/08/2015   Procedure: LOWER ENDOSCOPIC ULTRASOUND (EUS);  Surgeon: Arta Silence, MD;  Location[facility] ENDOSCOPY;  Service: Endoscopy;  Laterality: N/A;  . FLEXIBLE SIGMOIDOSCOPY N/A 08/08/2015   Procedure: FLEXIBLE SIGMOIDOSCOPY;  Surgeon: Arta Silence, MD;  Location: [facility] ENDOSCOPY;  Service: Endoscopy;  Laterality: N/A;  poor prep  . HOLMIUM LASER APPLICATION N/A 05/17/3715   Procedure: HOLMIUM LASER APPLICATION;  Surgeon: Alexis Frock, MD;  Location: [facility];  Service: Urology;  Laterality: N/A;  . INGUINAL HERNIA REPAIR Left 1965  . LEFT KNEE SURGERY  1940  . MOHS SURGERY  x9  last one 2014   . ORIF LEFT ANKLE FX  1935  . ROTATOR CUFF REPAIR Bilateral right  2013/  left 2010  . TRANSTHORACIC ECHOCARDIOGRAM  06-06-2013   ef 55-60%/  mild AR/  mild dilated aortic root/  mild LAE and RAE/  mild to moderate TR/  small pericardial effusion identified    There were no vitals filed for this visit.      Subjective Assessment - 11/25/16 0849    Subjective I was able to push urine out for the first time in a long time.  No change with fecal leakage. I can feel the leakage but it rarely happens. Most of the incontinence seems to be urine and I do not realize it.    Patient is accompained by: Family member   Patient Stated Goals reduce leakage of bowels and urinary   Currently in Pain?  No/denies                         [facility] Adult PT Treatment/Exercise - 11/25/16 0001      Self-Care   Self-Care Other Self-Care Comments   Other Self-Care Comments  discussed bathrooming technique for urination and bowel movement     Neuro Re-ed    Neuro Re-ed Details  hookly with abdominal contraction with pelvic floor contraction and tactile cues to contract the lower abdominals instead of the upper abdominals.       Manual Therapy   Manual Therapy Soft tissue mobilization   Soft tissue mobilization circular massage to abdomen to promote peralstalic movement of the intestine                PT Education - 11/25/16 0929    Education provided Yes   Education Details pelvic floor contraction with abdominal contraction    Person(s) Educated Patient   Methods Explanation;Demonstration;Verbal cues;Handout   Comprehension Returned demonstration;Verbalized understanding          PT Short Term Goals - 11/25/16 0856      PT SHORT TERM GOAL #1   Title independent with initial HEP   Time 4   Period Weeks   Status Achieved     PT SHORT TERM GOAL #2   Title pelvic floor contraction is 2/5 with slight lift   Time 4   Period Weeks   Status On-going     PT SHORT TERM GOAL #3   Title ability to contract abdominals with pelvic floor contraction to work on continence   Time 4   Period Weeks   Status On-going     PT SHORT TERM GOAL #4   Title fecal leakage decreased >/= 25% due to increased pelvic floor control   Time 4   Period Weeks   Status On-going  no change yet; change for  urination           PT Long Term Goals - 11/04/16 3785      PT LONG TERM GOAL #1   Title independent with HEP for pelvic floor strengthening   Time 8   Period Weeks   Status New     PT LONG TERM GOAL #2   Title fecal incontinence decreased >/= 60% due to increased pelvic floor strength   Time 8   Period Weeks   Status New     PT LONG TERM GOAL #3   Title ability  to be aware of himself leaking urine due to improved muscle control   Time 8   Period Weeks   Status New     PT LONG TERM GOAL #4   Title FOTO score </=  45% limitation   Time 8   Period Weeks   Status New     PT LONG TERM GOAL #5   Title abominal strength and pelvic floor strength is >/= 3/5 to improve continence   Time 8   Period Weeks   Status New               Plan - 11/25/16 0932    Clinical Impression Statement Patient was able to push the remainder of the urine one time for the first time in a long way. Patient will over contract the upper abdominals and not the lower.  Patient needs alot o ftactile and verbal cues to contract the lower abdominals instead upper abdominals.  Patient will benefit from skilled therapy to increase pelvic floor control and increase tone to decrease leakage.    Rehab Potential Good   Clinical Impairments Affecting Rehab Potential s/p rectal cancer with chemotherapy and radiation for treatment; enlarged prostate   PT Frequency 1x / week   PT Duration 8 weeks   PT Treatment/Interventions Biofeedback;Therapeutic activities;Therapeutic exercise;Neuromuscular re-education;Patient/family education;Manual techniques   PT Next Visit Plan contraction with lower abdominal in different postions; possible pelvic EMG   PT Home Exercise Plan progress as needed   Consulted and Agree with Plan of Care Patient      Patient will benefit from skilled therapeutic intervention in order to improve the following deficits and impairments:  Decreased coordination, Increased fascial restricitons, Decreased activity tolerance, Decreased endurance, Decreased strength  Visit Diagnosis: Muscle weakness (generalized)  Unspecified lack of coordination  Rectal cancer [facility])     Problem List Patient Active Problem List   Diagnosis Date Noted  . Anemia due to antineoplastic chemotherapy 09/18/2015  . Encounter for chemotherapy management 07/29/2015  . Rectal  cancer ([place]) 07/23/2015  . Acute pericarditis 06/08/2013  . Pericardial effusion 05/23/2013  . Near syncope 05/22/2013  . Diarrhea 05/22/2013  . Orthostatic hypotension 05/22/2013  . Atrial fibrillation ([place]) 05/12/2013  . Chest pain 05/11/2013  . HTN (hypertension) 05/11/2013    Earlie Counts, PT 11/25/16 9:37 AM   [facility] [address], [place], [place], [address] Phone: [phone]   Fax:  [phone]  Name: Nathaniel [name] MRN: [id number] Date of Birth: 05/15/1920

## 2016-12-02 ENCOUNTER — Ambulatory Visit: Payer: Medicare Other | Admitting: Physical Therapy

## 2016-12-02 ENCOUNTER — Encounter: Payer: Self-pay | Admitting: Physical Therapy

## 2016-12-02 DIAGNOSIS — C20 Malignant neoplasm of rectum: Secondary | ICD-10-CM | POA: Diagnosis not present

## 2016-12-02 DIAGNOSIS — R279 Unspecified lack of coordination: Secondary | ICD-10-CM | POA: Diagnosis not present

## 2016-12-02 DIAGNOSIS — M6281 Muscle weakness (generalized): Secondary | ICD-10-CM

## 2016-12-02 NOTE — Patient Instructions (Addendum)
Every 2 hours during the waking hours go to the bathroom to urinate. To train the bladder work on pelvic floor control.   Stop the abdominal massage.   Continue with HEP but make sure you are pulling the anus up and abdomen toward the spine while breathing.    [facility] [address] [place], [place] [address] Phone # [phone] Fax [phone]

## 2016-12-02 NOTE — Therapy (Signed)
[facility] [address] [place], [place], [address] Phone: [phone]   Fax:  [phone]  Physical Therapy Treatment  Patient Details  Name: Nathaniel [name] MRN: [id number] Date of Birth: Apr 25, 1920 Referring Provider: Dr. [name]  Encounter Date: 12/02/2016      PT End of Session - 12/02/16 1026    Visit Number 5   Number of Visits 10   Date for PT Re-Evaluation 12/30/16   Authorization Type Medicare G-code on 10th visit; KX modifier on 15th   PT Start Time 0845   PT Stop Time 0930   PT Time Calculation (min) 45 min   Activity Tolerance Patient tolerated treatment well   Behavior During Therapy [facility] for tasks assessed/performed      Past Medical History:  Diagnosis Date  . Anemia due to antineoplastic chemotherapy   . Atherosclerosis of coronary artery cardiologist-  dr Aundra Dubin   aorta and coronary arteries moderate per ct 02-07-2016  . Bladder calculi   . BPH (benign prostatic hyperplasia)   . Calculi, urethra   . Chronic low back pain   . Constipation   . ED (erectile dysfunction)   . Edema of lower extremity   . Foley catheter in place   . History of adenomatous polyp of colon    2007  . History of basal cell carcinoma excision    multiple  . History of cellulitis    left lower leg-- resolved  . History of melanoma excision    temple right side/ multiple  . History of obstructive sleep apnea    per pt cured by hypnosis  . History of pericarditis    acute episode 09/ 2014  per cardiologist note , dr Aundra Dubin , related possibly to xarelto use  . History of ulcerative colitis    remote  . HTN (hypertension)   . Hypercholesteremia   . Hypothyroidism   . Incomplete right bundle branch block   . LAFB (left anterior fascicular block)   . OA (osteoarthritis)   . PAF (paroxysmal atrial fibrillation) [facility]) cardiologist-  dr dalton Aundra Dubin   dx 09/ 2014  in setting of acute pericarditis   . Rectal  cancer [facility]) dx 07-16-2015  oncologist-  dr [name]   Stage IIIB (cT3, N1, M0)-- concurrent  chemotherapy started 08-12-2015/  radiation complete (08-12-2015 to 09-20-2015)  . S/P radiation therapy 08/12/15-09/20/15   rectal ca50.4Gy total dose  . Spinal stenosis     Past Surgical History:  Procedure Laterality Date  . CATARACT EXTRACTION W/ INTRAOCULAR LENS IMPLANT Bilateral 10/ 2016  . CYSTOSCOPY WITH LITHOLAPAXY N/A 02/12/2016   Procedure: CYSTOSCOPY WITH LITHOLAPAXY;  Surgeon: Alexis Frock, MD;  Location[facility];  Service: Urology;  Laterality: N/A;  . EUS N/A 08/08/2015   Procedure: LOWER ENDOSCOPIC ULTRASOUND (EUS);  Surgeon: Arta Silence, MD;  Location: [facility] ENDOSCOPY;  Service: Endoscopy;  Laterality: N/A;  . FLEXIBLE SIGMOIDOSCOPY N/A 08/08/2015   Procedure: FLEXIBLE SIGMOIDOSCOPY;  Surgeon: Arta Silence, MD;  Location: [facility] ENDOSCOPY;  Service: Endoscopy;  Laterality: N/A;  poor prep  . HOLMIUM LASER APPLICATION N/A 10/17/7987   Procedure: HOLMIUM LASER APPLICATION;  Surgeon: Alexis Frock, MD;  Location: [facility];  Service: Urology;  Laterality: N/A;  . INGUINAL HERNIA REPAIR Left 1965  . LEFT KNEE SURGERY  1940  . MOHS SURGERY  x9  last one 2014   . ORIF LEFT ANKLE FX  1935  . ROTATOR CUFF REPAIR Bilateral right  2013/  left 2010  . TRANSTHORACIC ECHOCARDIOGRAM  06-06-2013   ef 55-60%/  mild AR/  mild dilated aortic root/  mild LAE and RAE/  mild to moderate TR/  small pericardial effusion identified    There were no vitals filed for this visit.      Subjective Assessment - 12/02/16 0850    Subjective Most days patient has trace to nothing of fecal soiling. I have had the fecal soiling for 40 years. I am doing my exercises daily and abdominal massage.  Things feel loser. I am taking a water pill in the morning. Sometimes I have to change the pants. I am leaking more urine.  I feel the bowel movements more. I can force more urine out when I  am on the toilet. I do not see major change in anything. I am more dizzy due to changing the head position.    Patient Stated Goals reduce leakage of bowels and urinary   Currently in Pain? No/denies   Multiple Pain Sites No                         [facility] Adult PT Treatment/Exercise - 12/02/16 0001      Therapeutic Activites    Therapeutic Activities ADL's   ADL's bed mobility with breathing outward and pelvic floor contraction; going from sit to stand  with breathing to decrease      Neuro Re-ed    Neuro Re-ed Details  sidely pelvic floor contraction with lower abdominal contraction and breathing wiht many tactle cues to anus and abdomen and verbal cues                PT Education - 12/02/16 0927    Education provided Yes   Education Details timed toileting   Person(s) Educated Patient   Methods Explanation;Demonstration;Verbal cues   Comprehension Verbalized understanding;Returned demonstration          PT Short Term Goals - 12/02/16 1029      PT SHORT TERM GOAL #2   Title pelvic floor contraction is 2/5 with slight lift   Time 4   Period Weeks   Status On-going  still bulges instead of lift without tactile cues     PT SHORT TERM GOAL #3   Title ability to contract abdominals with pelvic floor contraction to work on continence   Time 4   Period Weeks   Status Achieved     PT SHORT TERM GOAL #4   Title fecal leakage decreased >/= 25% due to increased pelvic floor control   Time 4   Period Weeks   Status On-going           PT Long Term Goals - 11/04/16 1448      PT LONG TERM GOAL #1   Title independent with HEP for pelvic floor strengthening   Time 8   Period Weeks   Status New     PT LONG TERM GOAL #2   Title fecal incontinence decreased >/= 60% due to increased pelvic floor strength   Time 8   Period Weeks   Status New     PT LONG TERM GOAL #3   Title ability to be aware of himself leaking urine due to improved muscle  control   Time 8   Period Weeks   Status New     PT LONG TERM GOAL #4   Title FOTO score </= 45% limitation   Time 8  Period Weeks   Status New     PT LONG TERM GOAL #5   Title abominal strength and pelvic floor strength is >/= 3/5 to improve continence   Time 8   Period Weeks   Status New               Plan - 12/02/16 1027    Clinical Impression Statement Patient still needs many verbal and tactile cues to contract his pelvic floor without bulging, breath with contraction instead of hold his breath and contracting the lower abdominal.  Patient reports he is having more urinary leakage but that is due to him holding his breath with transitional movements and with pelvic floor exercises.  Patient is having more feeling with bowel movements.  Patient is able to push more urine out on his own.  Patient will benefit from skilled therapy to increase pelvic floor control without compensation  and increase tone to decrease leakage.    Rehab Potential Good   Clinical Impairments Affecting Rehab Potential s/p rectal cancer with chemotherapy and radiation for treatment; enlarged prostate   PT Frequency 1x / week   PT Duration 8 weeks   PT Treatment/Interventions Biofeedback;Therapeutic activities;Therapeutic exercise;Neuromuscular re-education;Patient/family education;Manual techniques   PT Next Visit Plan contraction with lower abdominal in different postions; possible pelvic EMG   PT Home Exercise Plan progress as needed   Consulted and Agree with Plan of Care Patient      Patient will benefit from skilled therapeutic intervention in order to improve the following deficits and impairments:  Decreased coordination, Increased fascial restricitons, Decreased activity tolerance, Decreased endurance, Decreased strength  Visit Diagnosis: Muscle weakness (generalized)  Unspecified lack of coordination  Rectal cancer [facility])     Problem List Patient Active Problem List   Diagnosis  Date Noted  . Anemia due to antineoplastic chemotherapy 09/18/2015  . Encounter for chemotherapy management 07/29/2015  . Rectal cancer ([place]) 07/23/2015  . Acute pericarditis 06/08/2013  . Pericardial effusion 05/23/2013  . Near syncope 05/22/2013  . Diarrhea 05/22/2013  . Orthostatic hypotension 05/22/2013  . Atrial fibrillation ([place]) 05/12/2013  . Chest pain 05/11/2013  . HTN (hypertension) 05/11/2013    Earlie Counts, PT 12/02/16 10:31 AM   [facility] [address], [place], [place], [address] Phone: [phone]   Fax:  [phone]  Name: Nathaniel [name] MRN: [id number] Date of Birth: 1920-07-10

## 2016-12-15 ENCOUNTER — Telehealth: Payer: Self-pay | Admitting: *Deleted

## 2016-12-15 NOTE — Telephone Encounter (Signed)
Received vm call from pt stating that he was trying to reach Susan/Navigator.  Returned call & left message for pt to call back tomorrow & I would try to help him.

## 2016-12-16 NOTE — Telephone Encounter (Signed)
Returned call to pt & he states that after a BM last week he had fresh blood on his tissue when he wiped & none since then.  He states that he hasn't had diarrhea nor constipated stools. He was concerned since this is how his cancer was found before.  Discussed with Dr Burr Medico & suggested pt call Dr Cristina Gong.to discuss.  Pt expressed understanding.

## 2016-12-17 ENCOUNTER — Ambulatory Visit: Payer: Medicare Other | Attending: Hematology | Admitting: Physical Therapy

## 2016-12-17 ENCOUNTER — Encounter: Payer: Self-pay | Admitting: Physical Therapy

## 2016-12-17 DIAGNOSIS — C20 Malignant neoplasm of rectum: Secondary | ICD-10-CM | POA: Diagnosis not present

## 2016-12-17 DIAGNOSIS — R279 Unspecified lack of coordination: Secondary | ICD-10-CM | POA: Diagnosis not present

## 2016-12-17 DIAGNOSIS — M6281 Muscle weakness (generalized): Secondary | ICD-10-CM | POA: Insufficient documentation

## 2016-12-17 NOTE — Therapy (Signed)
[facility] [address], [place], [place], [address] Phone: [phone]   Fax:  [phone]  Physical Therapy Treatment  Patient Details  Name: Nathaniel [name] MRN: [id number] Date of Birth: 1920/03/11 Referring Provider: Dr. [name]  Encounter Date: 12/17/2016      PT End of Session - 12/17/16 1053    Visit Number 6   Number of Visits 10   Date for PT Re-Evaluation 12/30/16   Authorization Type Medicare G-code on 10th visit; KX modifier on 15th   PT Start Time 1015   PT Stop Time 1100   PT Time Calculation (min) 45 min   Activity Tolerance Patient tolerated treatment well   Behavior During Therapy [facility] for tasks assessed/performed      Past Medical History:  Diagnosis Date  . Anemia due to antineoplastic chemotherapy   . Atherosclerosis of coronary artery cardiologist-  dr Aundra Dubin   aorta and coronary arteries moderate per ct 02-07-2016  . Bladder calculi   . BPH (benign prostatic hyperplasia)   . Calculi, urethra   . Chronic low back pain   . Constipation   . ED (erectile dysfunction)   . Edema of lower extremity   . Foley catheter in place   . History of adenomatous polyp of colon    2007  . History of basal cell carcinoma excision    multiple  . History of cellulitis    left lower leg-- resolved  . History of melanoma excision    temple right side/ multiple  . History of obstructive sleep apnea    per pt cured by hypnosis  . History of pericarditis    acute episode 09/ 2014  per cardiologist note , dr Aundra Dubin , related possibly to xarelto use  . History of ulcerative colitis    remote  . HTN (hypertension)   . Hypercholesteremia   . Hypothyroidism   . Incomplete right bundle branch block   . LAFB (left anterior fascicular block)   . OA (osteoarthritis)   . PAF (paroxysmal atrial fibrillation) [facility]) cardiologist-  dr dalton Aundra Dubin   dx 09/ 2014  in setting of acute pericarditis   . Rectal  cancer [facility]) dx 07-16-2015  oncologist-  dr [name]   Stage IIIB (cT3, N1, M0)-- concurrent  chemotherapy started 08-12-2015/  radiation complete (08-12-2015 to 09-20-2015)  . S/P radiation therapy 08/12/15-09/20/15   rectal ca50.4Gy total dose  . Spinal stenosis     Past Surgical History:  Procedure Laterality Date  . CATARACT EXTRACTION W/ INTRAOCULAR LENS IMPLANT Bilateral 10/ 2016  . CYSTOSCOPY WITH LITHOLAPAXY N/A 02/12/2016   Procedure: CYSTOSCOPY WITH LITHOLAPAXY;  Surgeon: Alexis Frock, MD;  Location: [facility];  Service: Urology;  Laterality: N/A;  . EUS N/A 08/08/2015   Procedure: LOWER ENDOSCOPIC ULTRASOUND (EUS);  Surgeon: Arta Silence, MD;  Location: [facility] ENDOSCOPY;  Service: Endoscopy;  Laterality: N/A;  . FLEXIBLE SIGMOIDOSCOPY N/A 08/08/2015   Procedure: FLEXIBLE SIGMOIDOSCOPY;  Surgeon: Arta Silence, MD;  Location[facility] ENDOSCOPY;  Service: Endoscopy;  Laterality: N/A;  poor prep  . HOLMIUM LASER APPLICATION N/A 03/10/9448   Procedure: HOLMIUM LASER APPLICATION;  Surgeon: Alexis Frock, MD;  Location: [facility];  Service: Urology;  Laterality: N/A;  . INGUINAL HERNIA REPAIR Left 1965  . LEFT KNEE SURGERY  1940  . MOHS SURGERY  x9  last one 2014   . ORIF LEFT ANKLE FX  1935  . ROTATOR CUFF REPAIR Bilateral right  2013/  left 2010  . TRANSTHORACIC ECHOCARDIOGRAM  06-06-2013   ef 55-60%/  mild AR/  mild dilated aortic root/  mild LAE and RAE/  mild to moderate TR/  small pericardial effusion identified    There were no vitals filed for this visit.      Subjective Assessment - 12/17/16 1020    Subjective I am able to force bowel movement out sometimes so I am feeling a muscle work. No change with urine. I have not tried to do every 2 hours to toilet. When he takes a pill to urinate I know I have to go to the bathroom.  I am having more frequent bowel movements. I have blood with first time I wipe.    Patient Stated Goals reduce leakage of  bowels and urinary   Currently in Pain? No/denies   Multiple Pain Sites No                      Pelvic Floor Special Questions - 12/17/16 0001    Biofeedback resting tone 0.53 uv, 3 quick contraction max 19.68 uv and avg. 6.54 uv; 10 second 4.30 uv; 20 second contraction 3.65; rest after exercise 0.58 uv.   Biofeedback sensor type Surface  rectal   Biofeedback Activity Other  assessment supine; 5 second contraction with breath                   PT Education - 12/17/16 1053    Education provided No          PT Short Term Goals - 12/17/16 1025      PT SHORT TERM GOAL #1   Title independent with initial HEP   Time 4   Period Weeks   Status Achieved     PT SHORT TERM GOAL #2   Title pelvic floor contraction is 2/5 with slight lift   Time 4   Period Weeks   Status On-going     PT SHORT TERM GOAL #3   Title ability to contract abdominals with pelvic floor contraction to work on continence   Time 4   Period Weeks   Status Achieved     PT SHORT TERM GOAL #4   Title fecal leakage decreased >/= 25% due to increased pelvic floor control   Time 4   Period Weeks   Status Achieved           PT Long Term Goals - 11/04/16 2458      PT LONG TERM GOAL #1   Title independent with HEP for pelvic floor strengthening   Time 8   Period Weeks   Status New     PT LONG TERM GOAL #2   Title fecal incontinence decreased >/= 60% due to increased pelvic floor strength   Time 8   Period Weeks   Status New     PT LONG TERM GOAL #3   Title ability to be aware of himself leaking urine due to improved muscle control   Time 8   Period Weeks   Status New     PT LONG TERM GOAL #4   Title FOTO score </= 45% limitation   Time 8   Period Weeks   Status New     PT LONG TERM GOAL #5   Title abominal strength and pelvic floor strength is >/= 3/5 to improve continence   Time 8   Period Weeks   Status New  Plan - 12/17/16 1054     Clinical Impression Statement Patient has low tone of pelvic floor at 0.58 uv.  Patient has difficulty with contracting pelvic floor and breathing.  Patient able to contract 3-5 seconds but not consistently.  Patient is able to push out his bowels with greater ease.  Patient has 25% less fecal leakage.  Pastient will benefit from skilled therapy to increase pelvic floor contraol without compensation and increase tone to decrease leakage.    Rehab Potential Good   Clinical Impairments Affecting Rehab Potential s/p rectal cancer with chemotherapy and radiation for treatment; enlarged prostate   PT Frequency 1x / week   PT Duration 8 weeks   PT Treatment/Interventions Biofeedback;Therapeutic activities;Therapeutic exercise;Neuromuscular re-education;Patient/family education;Manual techniques   PT Next Visit Plan  pelvic EMG in [place] with contraction with breathing; write renewal   PT Home Exercise Plan progress as needed   Consulted and Agree with Plan of Care Patient      Patient will benefit from skilled therapeutic intervention in order to improve the following deficits and impairments:  Decreased coordination, Increased fascial restricitons, Decreased activity tolerance, Decreased endurance, Decreased strength  Visit Diagnosis: Muscle weakness (generalized)  Unspecified lack of coordination  Rectal cancer [facility])     Problem List Patient Active Problem List   Diagnosis Date Noted  . Anemia due to antineoplastic chemotherapy 09/18/2015  . Encounter for chemotherapy management 07/29/2015  . Rectal cancer ([place]) 07/23/2015  . Acute pericarditis 06/08/2013  . Pericardial effusion 05/23/2013  . Near syncope 05/22/2013  . Diarrhea 05/22/2013  . Orthostatic hypotension 05/22/2013  . Atrial fibrillation ([place]) 05/12/2013  . Chest pain 05/11/2013  . HTN (hypertension) 05/11/2013    Earlie Counts, PT 12/17/16 11:01 AM   [facility] [address], [place], [place], [address] Phone: [phone]   Fax:  [phone]  Name: Nathaniel [name] MRN: [id number] Date of Birth: Sep 22, 1919

## 2016-12-21 DIAGNOSIS — Z85048 Personal history of other malignant neoplasm of rectum, rectosigmoid junction, and anus: Secondary | ICD-10-CM | POA: Diagnosis not present

## 2016-12-21 DIAGNOSIS — K5520 Angiodysplasia of colon without hemorrhage: Secondary | ICD-10-CM | POA: Diagnosis not present

## 2016-12-21 DIAGNOSIS — K625 Hemorrhage of anus and rectum: Secondary | ICD-10-CM | POA: Diagnosis not present

## 2016-12-22 DIAGNOSIS — E039 Hypothyroidism, unspecified: Secondary | ICD-10-CM | POA: Diagnosis not present

## 2016-12-22 DIAGNOSIS — C20 Malignant neoplasm of rectum: Secondary | ICD-10-CM | POA: Diagnosis not present

## 2016-12-22 DIAGNOSIS — R7301 Impaired fasting glucose: Secondary | ICD-10-CM | POA: Diagnosis not present

## 2016-12-22 DIAGNOSIS — E782 Mixed hyperlipidemia: Secondary | ICD-10-CM | POA: Diagnosis not present

## 2016-12-22 DIAGNOSIS — Z86008 Personal history of in-situ neoplasm of other site: Secondary | ICD-10-CM | POA: Diagnosis not present

## 2016-12-22 DIAGNOSIS — I10 Essential (primary) hypertension: Secondary | ICD-10-CM | POA: Diagnosis not present

## 2016-12-23 ENCOUNTER — Encounter: Payer: Self-pay | Admitting: Physical Therapy

## 2016-12-23 ENCOUNTER — Ambulatory Visit: Payer: Medicare Other | Admitting: Physical Therapy

## 2016-12-23 DIAGNOSIS — C20 Malignant neoplasm of rectum: Secondary | ICD-10-CM

## 2016-12-23 DIAGNOSIS — R279 Unspecified lack of coordination: Secondary | ICD-10-CM | POA: Diagnosis not present

## 2016-12-23 DIAGNOSIS — M6281 Muscle weakness (generalized): Secondary | ICD-10-CM | POA: Diagnosis not present

## 2016-12-23 NOTE — Therapy (Signed)
[facility] [address] [place], [place], [address] Phone: [phone]   Fax:  [phone]  Physical Therapy Treatment  Patient Details  Name: Nathaniel [name] MRN: [id number] Date of Birth: 02/04/1920 Referring Provider: Dr. [name]  Encounter Date: 12/23/2016      PT End of Session - 12/23/16 1057    Visit Number 7   Number of Visits 10   Date for PT Re-Evaluation 12/30/16   Authorization Type Medicare G-code on 10th visit; KX modifier on 15th   PT Start Time 1015   PT Stop Time 1055   PT Time Calculation (min) 40 min   Activity Tolerance Patient tolerated treatment well   Behavior During Therapy [facility] for tasks assessed/performed      Past Medical History:  Diagnosis Date  . Anemia due to antineoplastic chemotherapy   . Atherosclerosis of coronary artery cardiologist-  dr Aundra Dubin   aorta and coronary arteries moderate per ct 02-07-2016  . Bladder calculi   . BPH (benign prostatic hyperplasia)   . Calculi, urethra   . Chronic low back pain   . Constipation   . ED (erectile dysfunction)   . Edema of lower extremity   . Foley catheter in place   . History of adenomatous polyp of colon    2007  . History of basal cell carcinoma excision    multiple  . History of cellulitis    left lower leg-- resolved  . History of melanoma excision    temple right side/ multiple  . History of obstructive sleep apnea    per pt cured by hypnosis  . History of pericarditis    acute episode 09/ 2014  per cardiologist note , dr Aundra Dubin , related possibly to xarelto use  . History of ulcerative colitis    remote  . HTN (hypertension)   . Hypercholesteremia   . Hypothyroidism   . Incomplete right bundle branch block   . LAFB (left anterior fascicular block)   . OA (osteoarthritis)   . PAF (paroxysmal atrial fibrillation) [place]) cardiologist-  dr dalton Aundra Dubin   dx 09/ 2014  in setting of acute pericarditis   . Rectal  cancer [facility]) dx 07-16-2015  oncologist-  dr [name]   Stage IIIB (cT3, N1, M0)-- concurrent  chemotherapy started 08-12-2015/  radiation complete (08-12-2015 to 09-20-2015)  . S/P radiation therapy 08/12/15-09/20/15   rectal ca50.4Gy total dose  . Spinal stenosis     Past Surgical History:  Procedure Laterality Date  . CATARACT EXTRACTION W/ INTRAOCULAR LENS IMPLANT Bilateral 10/ 2016  . CYSTOSCOPY WITH LITHOLAPAXY N/A 02/12/2016   Procedure: CYSTOSCOPY WITH LITHOLAPAXY;  Surgeon: Alexis Frock, MD;  [facility];  Service: Urology;  Laterality: N/A;  . EUS N/A 08/08/2015   Procedure: LOWER ENDOSCOPIC ULTRASOUND (EUS);  Surgeon: Arta Silence, MD;  Location: [facility] ENDOSCOPY;  Service: Endoscopy;  Laterality: N/A;  . FLEXIBLE SIGMOIDOSCOPY N/A 08/08/2015   Procedure: FLEXIBLE SIGMOIDOSCOPY;  Surgeon: Arta Silence, MD;  Location: [facility] ENDOSCOPY;  Service: Endoscopy;  Laterality: N/A;  poor prep  . HOLMIUM LASER APPLICATION N/A 05/10/8100   Procedure: HOLMIUM LASER APPLICATION;  Surgeon: Alexis Frock, MD;  [facility];  Service: Urology;  Laterality: N/A;  . INGUINAL HERNIA REPAIR Left 1965  . LEFT KNEE SURGERY  1940  . MOHS SURGERY  x9  last one 2014   . ORIF LEFT ANKLE FX  1935  . ROTATOR CUFF REPAIR Bilateral right  2013/  left 2010  . TRANSTHORACIC ECHOCARDIOGRAM  06-06-2013   ef 55-60%/  mild AR/  mild dilated aortic root/  mild LAE and RAE/  mild to moderate TR/  small pericardial effusion identified    There were no vitals filed for this visit.      Subjective Assessment - 12/23/16 1021    Subjective I had an exam on Monday due to blood from the rectum and no cancer. I am more aware that something is there and able to force the bowels out. When I have a bowel movement it is complete. No change with urine leakage   Patient is accompained by: Family member   Patient Stated Goals reduce leakage of bowels and urinary   Currently in Pain?  No/denies   Multiple Pain Sites No                         [facility] Adult PT Treatment/Exercise - 12/23/16 0001      Lumbar Exercises: Supine   Other Supine Lumbar Exercises --  all exercises with pelvic floor contraction     Knee/Hip Exercises: Seated   Ball Squeeze squeeze 5 sec 10x; squeeze for 10 sec 10x  pelvic floor contraction   Clamshell with TheraBand Red  20 x with pelvic floor contraction   Marching Right;Left;20 reps  red band with pelvic floor contraction                PT Education - 12/23/16 1057    Education provided Yes   Education Details seated hip exercise with pelvic floor contraction   Person(s) Educated Patient   Methods Explanation;Demonstration;Verbal cues;Handout   Comprehension Verbalized understanding;Returned demonstration          PT Short Term Goals - 12/17/16 1025      PT SHORT TERM GOAL #1   Title independent with initial HEP   Time 4   Period Weeks   Status Achieved     PT SHORT TERM GOAL #2   Title pelvic floor contraction is 2/5 with slight lift   Time 4   Period Weeks   Status On-going     PT SHORT TERM GOAL #3   Title ability to contract abdominals with pelvic floor contraction to work on continence   Time 4   Period Weeks   Status Achieved     PT SHORT TERM GOAL #4   Title fecal leakage decreased >/= 25% due to increased pelvic floor control   Time 4   Period Weeks   Status Achieved           PT Long Term Goals - 12/23/16 1026      PT LONG TERM GOAL #1   Title independent with HEP for pelvic floor strengthening   Time 8   Period Weeks   Status On-going     PT LONG TERM GOAL #2   Title fecal incontinence decreased >/= 60% due to increased pelvic floor strength   Time 8   Period Weeks   Status On-going  40% better     PT LONG TERM GOAL #3   Title ability to be aware of himself leaking urine due to improved muscle control   Time 8   Period Weeks   Status On-going     PT LONG  TERM GOAL #4   Title FOTO score </= 45% limitation   Time 8   Period Weeks     PT LONG TERM GOAL #5  Title abominal strength and pelvic floor strength is >/= 3/5 to improve continence   Time 8   Period Weeks   Status On-going               Plan - 12/23/16 1058    Clinical Impression Statement Patient reports his fecal incontinence has improved by 40%.  Patient has no difference with urinary leakage. Patient is doing well with HEP.  Patient will benefit from skilled therapy to increase pelvic floor control without compensation and increase tone to decrease leakage.    Rehab Potential Good   Clinical Impairments Affecting Rehab Potential s/p rectal cancer with chemotherapy and radiation for treatment; enlarged prostate   PT Frequency 1x / week   PT Duration 8 weeks   PT Treatment/Interventions Biofeedback;Therapeutic activities;Therapeutic exercise;Neuromuscular re-education;Patient/family education;Manual techniques   PT Next Visit Plan Discharge patient next visit; pelvic floor EMG   PT Home Exercise Plan review HEP   Consulted and Agree with Plan of Care Patient      Patient will benefit from skilled therapeutic intervention in order to improve the following deficits and impairments:  Decreased coordination, Increased fascial restricitons, Decreased activity tolerance, Decreased endurance, Decreased strength  Visit Diagnosis: Muscle weakness (generalized)  Unspecified lack of coordination  Rectal cancer [facility])     Problem List Patient Active Problem List   Diagnosis Date Noted  . Anemia due to antineoplastic chemotherapy 09/18/2015  . Encounter for chemotherapy management 07/29/2015  . Rectal cancer ([place]) 07/23/2015  . Acute pericarditis 06/08/2013  . Pericardial effusion 05/23/2013  . Near syncope 05/22/2013  . Diarrhea 05/22/2013  . Orthostatic hypotension 05/22/2013  . Atrial fibrillation ([place]) 05/12/2013  . Chest pain 05/11/2013  . HTN (hypertension)  05/11/2013    Earlie Counts, PT 12/23/16 11:10 AM   [facility] [address], [place], [place], [address] Phone: [phone]   Fax:  [phone]  Name: FIELD STANISZEWSKI MRN: [id number] Date of Birth: 1920-08-03

## 2016-12-23 NOTE — Patient Instructions (Addendum)
   While sitting, place a ball between your knees. Squeeze the ball with your knees, contract pelvic floor muscles and hold 10 sec 10 times..     Start in sitting with good posture. Activate core stabilizing muscles to protect lumbar spine and contract pelvic floor muscles. Bring knees together and wrap an elastic exercise band around them. Press the knees outward, activating the  hip stabilizers. Hold, then slowly return to starting position. Repeat 20 times 1 time per day    Start by sitting in a chair with an elastic band wrapped around your lower thighs.  Next, move a knee upward, set it back down and then alternate to the other side.20 times 1 time per day with pelvic floor contraction.  [employer] [address], [place], [place] [address] Phone # [phone] Fax [phone]

## 2016-12-30 ENCOUNTER — Encounter: Payer: Self-pay | Admitting: Physical Therapy

## 2016-12-30 ENCOUNTER — Ambulatory Visit: Payer: Medicare Other | Admitting: Physical Therapy

## 2016-12-30 DIAGNOSIS — M6281 Muscle weakness (generalized): Secondary | ICD-10-CM | POA: Diagnosis not present

## 2016-12-30 DIAGNOSIS — R279 Unspecified lack of coordination: Secondary | ICD-10-CM

## 2016-12-30 DIAGNOSIS — C20 Malignant neoplasm of rectum: Secondary | ICD-10-CM

## 2016-12-30 NOTE — Therapy (Signed)
[facility] [address] [place], [place], [address] Phone: [phone]   Fax:  [phone]  Physical Therapy Treatment  Patient Details  Name: Nathaniel [name] MRN: [id number] Date of Birth: 1919-11-12 Referring Provider: Dr. [name]  Encounter Date: 12/30/2016      PT End of Session - 12/30/16 1051    Visit Number 8   Date for PT Re-Evaluation 12/30/16   PT Start Time 6644   PT Stop Time 1053   PT Time Calculation (min) 38 min   Activity Tolerance Patient tolerated treatment well   Behavior During Therapy [facility] for tasks assessed/performed      Past Medical History:  Diagnosis Date  . Anemia due to antineoplastic chemotherapy   . Atherosclerosis of coronary artery cardiologist-  dr Aundra Dubin   aorta and coronary arteries moderate per ct 02-07-2016  . Bladder calculi   . BPH (benign prostatic hyperplasia)   . Calculi, urethra   . Chronic low back pain   . Constipation   . ED (erectile dysfunction)   . Edema of lower extremity   . Foley catheter in place   . History of adenomatous polyp of colon    2007  . History of basal cell carcinoma excision    multiple  . History of cellulitis    left lower leg-- resolved  . History of melanoma excision    temple right side/ multiple  . History of obstructive sleep apnea    per pt cured by hypnosis  . History of pericarditis    acute episode 09/ 2014  per cardiologist note , dr Aundra Dubin , related possibly to xarelto use  . History of ulcerative colitis    remote  . HTN (hypertension)   . Hypercholesteremia   . Hypothyroidism   . Incomplete right bundle branch block   . LAFB (left anterior fascicular block)   . OA (osteoarthritis)   . PAF (paroxysmal atrial fibrillation) [facility]) cardiologist-  dr dalton Aundra Dubin   dx 09/ 2014  in setting of acute pericarditis   . Rectal cancer [facility]) dx 07-16-2015  oncologist-  dr [name]   Stage IIIB (cT3, N1, M0)-- concurrent   chemotherapy started 08-12-2015/  radiation complete (08-12-2015 to 09-20-2015)  . S/P radiation therapy 08/12/15-09/20/15   rectal ca50.4Gy total dose  . Spinal stenosis     Past Surgical History:  Procedure Laterality Date  . CATARACT EXTRACTION W/ INTRAOCULAR LENS IMPLANT Bilateral 10/ 2016  . CYSTOSCOPY WITH LITHOLAPAXY N/A 02/12/2016   Procedure: CYSTOSCOPY WITH LITHOLAPAXY;  Surgeon: Alexis Frock, MD;  [facility];  Service: Urology;  Laterality: N/A;  . EUS N/A 08/08/2015   Procedure: LOWER ENDOSCOPIC ULTRASOUND (EUS);  Surgeon: Arta Silence, MD;  Location: [facility] ENDOSCOPY;  Service: Endoscopy;  Laterality: N/A;  . FLEXIBLE SIGMOIDOSCOPY N/A 08/08/2015   Procedure: FLEXIBLE SIGMOIDOSCOPY;  Surgeon: Arta Silence, MD;  Location: [facility] ENDOSCOPY;  Service: Endoscopy;  Laterality: N/A;  poor prep  . HOLMIUM LASER APPLICATION N/A 0/11/4740   Procedure: HOLMIUM LASER APPLICATION;  Surgeon: Alexis Frock, MD;  Location: [facility];  Service: Urology;  Laterality: N/A;  . INGUINAL HERNIA REPAIR Left 1965  . LEFT KNEE SURGERY  1940  . MOHS SURGERY  x9  last one 2014   . ORIF LEFT ANKLE FX  1935  . ROTATOR CUFF REPAIR Bilateral right 2013/  left 2010  . TRANSTHORACIC ECHOCARDIOGRAM  06-06-2013   ef 55-60%/  mild AR/  mild  dilated aortic root/  mild LAE and RAE/  mild to moderate TR/  small pericardial effusion identified    There were no vitals filed for this visit.      Subjective Assessment - 12/30/16 1017    Subjective Today is my last visit.  I have made very little progress since last time.  I have more progress with the bowels but not the bladder. I am not aware if I leak urine.  Patient has to be over the toilet to change his pad.  Patient does not have to clean up in the bathroom after a bowel movement.    Patient Stated Goals reduce leakage of bowels and urinary   Currently in Pain? No/denies            [facility] PT Assessment - 12/30/16  0001      Assessment   Medical Diagnosis C20 rectal cancer   Referring Provider Dr. Malachy Mood   Onset Date/Surgical Date 08/11/16   Prior Therapy None     Precautions   Precautions Other (comment)   Precaution Comments rectal cancer     Restrictions   Weight Bearing Restrictions No     Balance Screen   Has the patient fallen in the past 6 months No   Has the patient had a decrease in activity level because of a fear of falling?  No   Is the patient reluctant to leave their home because of a fear of falling?  No     Home Environment   Living Environment Assisted living  [facility] Equipment Walker - 4 wheels  with seat     Prior Function   Level of Independence Independent   Vocation Retired     IT consultant   Overall Cognitive Status Within Functional Limits for tasks assessed     Observation/Other Assessments   Focus on Therapeutic Outcomes (FOTO)  54% limitation  goal is 45% limitation     Posture/Postural Control   Posture/Postural Control Postural limitations   Postural Limitations Rounded Shoulders;Forward head;Decreased lumbar lordosis   Posture Comments Patient has spinal stenosis     AROM   Overall AROM Comments left hip ROM decreased by 50%     Strength   Overall Strength Comments left hip abduction and adduction 4-/5; abdominal strength is 3/5     Ambulation/Gait   Ambulation/Gait Yes   Assistive device 4-wheeled walker  with seat   Gait Pattern Decreased hip/knee flexion - left;Decreased stance time - left                  Pelvic Floor Special Questions - 12/30/16 0001    Strength weak squeeze, no lift           [facility] Adult PT Treatment/Exercise - 12/30/16 0001      Knee/Hip Exercises: Seated   Ball Squeeze squeeze 5 sec 10x; squeeze for 10 sec 10x  pelvic floor contraction; Vc on posture   Clamshell with TheraBand Red  20 x with pelvic floor contraction   Marching Right;Left;20 reps  red band with pelvic floor contraction                 PT Education - 12/30/16 1053    Education provided Yes   Education Details using the nustep at facility he is living, reviewed past HEP   Person(s) Educated Patient   Methods Explanation   Comprehension Verbalized understanding          PT Short Term Goals -  14-Jan-2017 1052      PT SHORT TERM GOAL #2   Title pelvic floor contraction is 2/5 with slight lift   Time 4   Period Weeks   Status Achieved           PT Long Term Goals - 01/14/2017 1019      PT LONG TERM GOAL #1   Title independent with HEP for pelvic floor strengthening   Time 8   Period Weeks   Status Achieved     PT LONG TERM GOAL #2   Title fecal incontinence decreased >/= 60% due to increased pelvic floor strength   Time 8   Period Weeks   Status Partially Met  50% better     PT LONG TERM GOAL #3   Title ability to be aware of himself leaking urine due to improved muscle control   Time 8   Period Weeks   Status Partially Met     PT LONG TERM GOAL #4   Title FOTO score </= 45% limitation   Time 8   Period Weeks   Status Not Met  54% limitation     PT LONG TERM GOAL #5   Title abominal strength and pelvic floor strength is >/= 3/5 to improve continence   Time 8   Period Weeks   Status Achieved               Plan - 01-14-17 1029    Clinical Impression Statement Patient fecal leakage has decreased by 50%.  He is able to get to the commode in time with slight fecal leakage.  Patient is still not aware of the urine leakage. Patient needs verbal cues to sit upright wiht hip exercises.  Patient is happy with his current HEP and progress.  Patient has met LTG # for HEP.  Patient is able to tighten his abdominals better with pelvic floor contraction. FOTO improved to 54% limitation.  Patient has increased in hip ROM and pelvic floor strength.    Rehab Potential Good   Clinical Impairments Affecting Rehab Potential s/p rectal cancer with chemotherapy and radiation for  treatment; enlarged prostate   PT Frequency 1x / week   PT Duration 8 weeks   PT Treatment/Interventions Biofeedback;Therapeutic activities;Therapeutic exercise;Neuromuscular re-education;Patient/family education;Manual techniques   PT Next Visit Plan Discharge to current HEP   PT Home Exercise Plan Current HEP   Recommended Other Services None   Consulted and Agree with Plan of Care Patient      Patient will benefit from skilled therapeutic intervention in order to improve the following deficits and impairments:  Decreased coordination, Increased fascial restricitons, Decreased activity tolerance, Decreased endurance, Decreased strength  Visit Diagnosis: Muscle weakness (generalized)  Unspecified lack of coordination  Rectal cancer ([facility])       G-Codes - 2017-01-14 1050    Functional Assessment Tool Used (Outpatient Only) FOTO score is 54% limitation   Functional Limitation Other PT primary   Other PT Primary Goal Status (I0973) At least 40 percent but less than 60 percent impaired, limited or restricted   Other PT Primary Discharge Status (Z3299) At least 40 percent but less than 60 percent impaired, limited or restricted      Problem List Patient Active Problem List   Diagnosis Date Noted  . Anemia due to antineoplastic chemotherapy 09/18/2015  . Encounter for chemotherapy management 07/29/2015  . Rectal cancer ([place]) 07/23/2015  . Acute pericarditis 06/08/2013  . Pericardial effusion 05/23/2013  . Near syncope 05/22/2013  .  Diarrhea 05/22/2013  . Orthostatic hypotension 05/22/2013  . Atrial fibrillation ([place]) 05/12/2013  . Chest pain 05/11/2013  . HTN (hypertension) 05/11/2013    Earlie Counts, PT 12/30/16 10:56 AM   [facility] [address], [place], [place], [address] Phone: [phone]   Fax:  [phone]  Name: Nathaniel [name] MRN: [id number] Date of Birth: 02/03/1920  PHYSICAL THERAPY  DISCHARGE SUMMARY  Visits from Start of Care: 8  Current functional level related to goals / functional outcomes: See above.  Patient has made progress and is happy with current progress.    Remaining deficits: See above   Education / Equipment: HEP Plan: Patient agrees to discharge.  Patient goals were partially met. Patient is being discharged due to being pleased with the current functional level.  Thank you for the referral. Earlie Counts, PT 12/30/16 10:56 AM  ?????

## 2016-12-30 NOTE — Patient Instructions (Addendum)
Physical Therapy      While sitting, place a ball between your knees. Squeeze the ball with your knees, contract pelvic floor muscles and hold 10 sec 10 times..     Start in sitting with good posture. Activate core stabilizing muscles to protect lumbar spine and contract pelvic floor muscles. Bring knees together and wrap an elastic exercise band around them. Press the knees outward, activating the  hip stabilizers. Hold, then slowly return to starting position. Repeat 20 times 1 time per day    Start by sitting in a chair with an elastic band wrapped around your lower thighs.  Next, move a knee upward, set it back down and then alternate to the other side.20 times 1 time per day with pelvic floor contraction.  [facility] [address], [place], [place] [address] Phone # [phone] Fax [phone]     Quick Contraction: Gravity Resisted (Sitting)    Sitting, quickly squeeze then fully relax pelvic floor. Perform __1_ sets of 10___. Rest for _1__ seconds between sets. Do _3__ times a day.  Copyright  VHI. All rights reserved.  Slow Contraction: Gravity Resisted (Sitting)    Sitting, slowly squeeze pelvic floor for _10__ seconds. Rest for __5_ seconds. Repeat _5__ times. Do _3__ times a day.  Copyright  VHI. All rights reserved.  [facility] [address] [place], [place] [address] Phone # [phone] Fax [phone]

## 2017-01-04 DIAGNOSIS — N401 Enlarged prostate with lower urinary tract symptoms: Secondary | ICD-10-CM | POA: Diagnosis not present

## 2017-01-04 DIAGNOSIS — N210 Calculus in bladder: Secondary | ICD-10-CM | POA: Diagnosis not present

## 2017-01-04 DIAGNOSIS — R351 Nocturia: Secondary | ICD-10-CM | POA: Diagnosis not present

## 2017-01-25 ENCOUNTER — Encounter: Payer: Self-pay | Admitting: Nurse Practitioner

## 2017-01-25 ENCOUNTER — Ambulatory Visit: Payer: Medicare Other | Admitting: Nurse Practitioner

## 2017-01-25 ENCOUNTER — Ambulatory Visit (INDEPENDENT_AMBULATORY_CARE_PROVIDER_SITE_OTHER): Payer: Medicare Other | Admitting: Nurse Practitioner

## 2017-01-25 VITALS — BP 142/88 | HR 62 | Ht 72.0 in | Wt 196.8 lb

## 2017-01-25 DIAGNOSIS — I480 Paroxysmal atrial fibrillation: Secondary | ICD-10-CM

## 2017-01-25 DIAGNOSIS — Z8679 Personal history of other diseases of the circulatory system: Secondary | ICD-10-CM | POA: Diagnosis not present

## 2017-01-25 DIAGNOSIS — I313 Pericardial effusion (noninflammatory): Secondary | ICD-10-CM

## 2017-01-25 DIAGNOSIS — I3139 Other pericardial effusion (noninflammatory): Secondary | ICD-10-CM

## 2017-01-25 DIAGNOSIS — I10 Essential (primary) hypertension: Secondary | ICD-10-CM | POA: Diagnosis not present

## 2017-01-25 NOTE — Progress Notes (Signed)
CARDIOLOGY OFFICE NOTE  Date:  01/25/2017    [name] Date of Birth: 04-Feb-1920 Medical Record #[id number]  PCP:  [name], MD  Cardiologist:  Annabell Howells    Chief Complaint  Patient presents with  . Atrial Fibrillation    Follow up visit - seen for Dr. Aundra Dubin    History of Present Illness: Nathaniel [name] is a 83 y.o. male who presents today for a follow up visit. Seen for Dr. Aundra Dubin.   He has had acute pericarditis and atrial fibrillation. He was admitted in early 9/14 with pleuritic chest pain.  He had diffuse slight ST elevation on ECG and was thought to have acute pericarditis, ?viral pericarditis.  No pericardial effusion initially.  While in the hospital, he went into atrial fibrillation with RVR and was treated with amiodarone.  He converted to NSR and has remained in NSR.   He was started on Xarelto in the hospital.  Followup echo later in 9/14 showed the development of a moderate pericardial effusion without tamponade.  Xarelto was stopped.  Patient had another echo on 06/06/13 that showed a small pericardial effusion (improved).    He has been diagnosed with rectal cancer and underwent chemotherapy and radiation, opted against surgery.   Saw Dr. Aundra Dubin back in November and was felt to be doing ok from our standpoint.   Comes in today. Here alone. He feels like he is doing ok. No chest pain. Breathing is good. Uses his walker. Tries to be as active as much as possible. Sounds like he is very involved at [employer] No current bleeding - says his cancer was cured. No rectal bleeding. He has some chronic swelling in the left leg - unchanged. Tries to restrict his salt.   PMH: 1. Hyperlipidemia 2. Hypothyroidism 3. HTN 4. Acute pericarditis: 9/14.  Echo (05/11/13) with EF 55-60%, normal RV, no pericardial effusion.  Echo (05/22/13) with moderate pericardial effusion, no tamponade.  Echo (06/06/13) with small pericardial effusion, no tamponade.  5.  Paroxysmal atrial fibrillation: In setting of acute pericarditis when initially noted.  Was started on Xarelto but this was stopped when he developed a pericardial effusion.  6. Chronic low back pain 7. Venous insufficiency.  8. Rectal cancer: s/p chemotherapy and radiation.  Decided against surgery.  9. Nephrolithiasis.     Past Medical History:  Diagnosis Date  . Anemia due to antineoplastic chemotherapy   . Atherosclerosis of coronary artery cardiologist-  dr Aundra Dubin   aorta and coronary arteries moderate per ct 02-07-2016  . Bladder calculi   . BPH (benign prostatic hyperplasia)   . Calculi, urethra   . Chronic low back pain   . Constipation   . ED (erectile dysfunction)   . Edema of lower extremity   . Foley catheter in place   . History of adenomatous polyp of colon    2007  . History of basal cell carcinoma excision    multiple  . History of cellulitis    left lower leg-- resolved  . History of melanoma excision    temple right side/ multiple  . History of obstructive sleep apnea    per pt cured by hypnosis  . History of pericarditis    acute episode 09/ 2014  per cardiologist note , dr Aundra Dubin , related possibly to xarelto use  . History of ulcerative colitis    remote  . HTN (hypertension)   . Hypercholesteremia   . Hypothyroidism   . Incomplete  right bundle branch block   . LAFB (left anterior fascicular block)   . OA (osteoarthritis)   . PAF (paroxysmal atrial fibrillation) [facility]) cardiologist-  dr dalton Aundra Dubin   dx 09/ 2014  in setting of acute pericarditis   . Rectal cancer [facility]) dx 07-16-2015  oncologist-  dr [name]   Stage IIIB (cT3, N1, M0)-- concurrent  chemotherapy started 08-12-2015/  radiation complete (08-12-2015 to 09-20-2015)  . S/P radiation therapy 08/12/15-09/20/15   rectal ca50.4Gy total dose  . Spinal stenosis     Past Surgical History:  Procedure Laterality Date  . CATARACT EXTRACTION W/ INTRAOCULAR LENS IMPLANT Bilateral 10/ 2016  .  CYSTOSCOPY WITH LITHOLAPAXY N/A 02/12/2016   Procedure: CYSTOSCOPY WITH LITHOLAPAXY;  Surgeon: Alexis Frock, MD;  Location[facility]  Service: Urology;  Laterality: N/A;  . EUS N/A 08/08/2015   Procedure: LOWER ENDOSCOPIC ULTRASOUND (EUS);  Surgeon: Arta Silence, MD;  Location: [facility] ENDOSCOPY;  Service: Endoscopy;  Laterality: N/A;  . FLEXIBLE SIGMOIDOSCOPY N/A 08/08/2015   Procedure: FLEXIBLE SIGMOIDOSCOPY;  Surgeon: Arta Silence, MD;  Location: [facility] ENDOSCOPY;  Service: Endoscopy;  Laterality: N/A;  poor prep  . HOLMIUM LASER APPLICATION N/A 03/15/2955   Procedure: HOLMIUM LASER APPLICATION;  Surgeon: Alexis Frock, MD;  Location: [facility];  Service: Urology;  Laterality: N/A;  . INGUINAL HERNIA REPAIR Left 1965  . LEFT KNEE SURGERY  1940  . MOHS SURGERY  x9  last one 2014   . ORIF LEFT ANKLE FX  1935  . ROTATOR CUFF REPAIR Bilateral right 2013/  left 2010  . TRANSTHORACIC ECHOCARDIOGRAM  06-06-2013   ef 55-60%/  mild AR/  mild dilated aortic root/  mild LAE and RAE/  mild to moderate TR/  small pericardial effusion identified     Medications: Current Outpatient Prescriptions  Medication Sig Dispense Refill  . aspirin EC 325 MG tablet Take 325 mg by mouth daily.     . finasteride (PROSCAR) 5 MG tablet Take 5 mg by mouth every evening. Dose is taken at bedtime    . levothyroxine (SYNTHROID, LEVOTHROID) 100 MCG tablet TAKE 1 TABLET ON AN EMPTY STOMACH IN THE MORNING ONCE A DAY  2  . metoprolol succinate (TOPROL-XL) 25 MG 24 hr tablet TAKE 1/2 TABLET BY MOUTH DAILY. 45 tablet 2  . Multiple Vitamin (MULTIVITAMIN WITH MINERALS) TABS tablet Take 1 tablet by mouth daily.    . potassium chloride (K-DUR) 10 MEQ tablet Take 1 tablet (10 mEq total) by mouth daily. 90 tablet 1  . furosemide (LASIX) 20 MG tablet Take 1 tablet (20 mg total) by mouth daily. 90 tablet 1   No current facility-administered medications for this visit.     Allergies: Allergies    Allergen Reactions  . Cinnamon Itching  . Ibuprofen Diarrhea  . Percocet [Oxycodone-Acetaminophen] Other (See Comments)    confusion  . Protonix [Pantoprazole Sodium] Diarrhea and Nausea And Vomiting  . Xarelto [Rivaroxaban] Other (See Comments)    Diarrhea, dehydration    Social History: The patient  reports that he has never smoked. He has never used smokeless tobacco. He reports that he drinks about 0.6 oz of alcohol per week . He reports that he does not use drugs.   Family History: The patient's family history includes Cancer in his mother; Stroke in his father.   Review of Systems: Please see the history of present illness.   Otherwise, the review of systems is positive for none.   All other systems  are reviewed and negative.   Physical Exam: VS:  BP (!) 142/88 (BP Location: Left Arm, Patient Position: Sitting, Cuff Size: Normal)   Pulse 62   Ht 6' (1.829 m)   Wt 196 lb 12.8 oz (89.3 kg)   SpO2 96% Comment: at rest  BMI 26.69 kg/m  .  BMI Body mass index is 26.69 kg/m.  Wt Readings from Last 3 Encounters:  01/25/17 196 lb 12.8 oz (89.3 kg)  10/14/16 197 lb 14.4 oz (89.8 kg)  09/30/16 199 lb (90.3 kg)    General: Pleasant. Elderly male who looks younger than his stated age. He is quite alert and no acute distress.   HEENT: Normal.  Neck: Supple, no JVD, carotid bruits, or masses noted.  Cardiac: Regular rate and rhythm. No murmurs, rubs, or gallops. Trace edema - more so on the left.  Respiratory:  Lungs are clear to auscultation bilaterally with normal work of breathing.  GI: Soft and nontender.  MS: No deformity or atrophy. Gait and ROM intact but he is kyphotic - using a walker.   Skin: Warm and dry. Color is normal.  Neuro:  Strength and sensation are intact and no gross focal deficits noted.  Psych: Alert, appropriate and with normal affect.   LABORATORY DATA:  EKG:  EKG is not ordered today.  Lab Results  Component Value Date   WBC 5.2 10/14/2016    HGB 13.6 10/14/2016   HCT 42.4 10/14/2016   PLT 201 10/14/2016   GLUCOSE 88 10/14/2016   ALT 20 10/14/2016   AST 26 10/14/2016   NA 139 10/14/2016   K 4.3 10/14/2016   CL 103 02/12/2016   CREATININE 1.0 10/14/2016   BUN 17.0 10/14/2016   CO2 28 10/14/2016   TSH 1.49 09/14/2013    BNP (last 3 results) No results for input(s): BNP in the last 8760 hours.  ProBNP (last 3 results) No results for input(s): PROBNP in the last 8760 hours.   Other Studies Reviewed Today:  Echo Study Conclusions from 08/2016  - Left ventricle: The cavity size was normal. There was moderate   concentric hypertrophy. Systolic function was normal. The   estimated ejection fraction was in the range of 60% to 65%. Wall   motion was normal; there were no regional wall motion   abnormalities. Doppler parameters are consistent with abnormal   left ventricular relaxation (grade 1 diastolic dysfunction).   Doppler parameters are consistent with indeterminate ventricular   filling pressure. - Aortic valve: Transvalvular velocity was within the normal range.   There was no stenosis. There was mild regurgitation. - Aorta: Ascending aortic diameter: 35 mm (S). - Mitral valve: Transvalvular velocity was within the normal range.   There was no evidence for stenosis. There was no regurgitation. - Right ventricle: The cavity size was mildly dilated. Wall   thickness was normal. Systolic function was normal. - Right atrium: The atrium was mildly dilated. - Atrial septum: No defect or patent foramen ovale was identified   by color flow Doppler. - Tricuspid valve: There was mild regurgitation. - Pulmonary arteries: PA peak pressure: 38 mm Hg (S).  Assessment/Plan:  1. History of pericarditis - dates back to 2014 -   2. PAF - one lone episode in the setting of acute pericarditis - no longer on Xarelto due to possibility that it was associated with pericardial effusion. CHADSVASC is at least a 3. He has been on  high dose aspirin since this time due to the reaction  from [place] - will continue for now.   3. Prior rectal bleeding - none at this time. He has been treated for rectal cancer.   4. Advanced age - he is doing quite well.   5. HTN - Bp by me is 120/60 - no changes with his current regimen.   Current medicines are reviewed with the patient today.  The patient does not have concerns regarding medicines other than what has been noted above.  The following changes have been made:  See above.  Labs/ tests ordered today include:   No orders of the defined types were placed in this encounter.    Disposition:   FU with me in 6 months.   Patient is agreeable to this plan and will call if any problems develop in the interim.   SignedTruitt Merle, NP  01/25/2017 9:26 AM  [facility] [address] [place], [place]  [address] Phone: [phone] Fax: [phone]

## 2017-01-25 NOTE — Patient Instructions (Addendum)
We will be checking the following labs today - NONE   Medication Instructions:    Continue with your current medicines.     Testing/Procedures To Be Arranged:  N/A  Follow-Up:   See me in 6 months with EKG    Other Special Instructions:   N/A    If you need a refill on your cardiac medications before your next appointment, please call your pharmacy.   Call the [place] office at [phone] if you have any questions, problems or concerns.

## 2017-02-02 ENCOUNTER — Other Ambulatory Visit: Payer: Self-pay | Admitting: Cardiology

## 2017-02-02 DIAGNOSIS — I480 Paroxysmal atrial fibrillation: Secondary | ICD-10-CM

## 2017-02-02 DIAGNOSIS — R0602 Shortness of breath: Secondary | ICD-10-CM

## 2017-02-04 NOTE — Progress Notes (Signed)
[place]  [phone] Fax:(336) [phone]  Clinic follow up Note   Patient Care Team: Nathaniel Fess, MD as PCP - General (Family Medicine) Nathaniel Lobo, MD as Consulting Physician (Gastroenterology) Nathaniel Ade, RN as Registered Nurse 02/10/2017  CHIEF COMPLAINTS:  Follow up rectal cancer  Oncology History    Rectal cancer [facility])   Staging form: Colon and Rectum, AJCC 7th Edition     Clinical stage from 08/08/2015: Stage IIIB (T3, N1, M0) - Unsigned       Rectal cancer ([place])   07/16/2015 Pathology Results    Biopsy positive for invasive adenocarcinoma with signet ring cell features      07/23/2015 Initial Diagnosis    Rectal cancer ([place])      08/12/2015 Concurrent Chemotherapy    Xeloda 1500mg  q12h on the day of radiation       08/12/2015 - 09/20/2015 Radiation Therapy    radiaiton to rectal cancer       02/18/2016 Procedure    FLEX SIGMOIDOSCOPY: Endoscopic disappearance of previous rectal mass; atrophic mucosa in distal rectum  per Dr. Cristina [name]      02/20/2016 Pathology Results    No adenomatous change or malignancy        HISTORY OF PRESENTING ILLNESS:  Nathaniel [name] 83 y.o. male is here because of recently diagnosed rectal cancer. He presents to the clinic by himself.  He has had abnormal bowel movement for the past 2-3 months. It's usually very small amount, loose, every time he urinates, he has a such a small bowel movement. He does not really have a good normal bowel movement. He also reports frequent stool stains on his underwear. He noticed small amount blood in his stool recently, which prompt his seeing GI Dr. Cristina [name] on. He underwent a flexible sigmoidoscopy in the office, which showed a rectal mass, biopsy reviewed adenocarcinoma. He denies significant rectal pain, abdominal discomfort, or other new complaints.  He lives in a independent living facility, he is able to take care of his all ADLs, and does some light  housework, shopping, by himself. He uses a walker, still drives. He does get fatigued after activity, his energy level has seemed to be decreasing in over the past few years. No significant change daily. He has decent appetite and eats well.  He had remote history of ulcerative colitis, which has been in remission. 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  He is widowed, has 5 children, who all live in out states. He does have several stepchildren from his secondary to, and some of them live in [place].  CURRENT TREATMENT: observation   INTERIM HISTORY [name] returns for follow-up. He presents to the clinic today in his wheelchair. He reports to having incontinence and he went to pelvic PT and he can control his bowel to some degree. Occasionally he would get blood but that is from his tumor. He denies any other pain.  Appetite is good and he put on a little weight, denies nausea but can get SOB occasionally. Nothing new. His last colonoscopy was before he did radiation and chemo in 2017  His birthday is [birth year] and he will be celebrating.    MEDICAL HISTORY:  Past Medical History:  Diagnosis Date  . Anemia due to antineoplastic chemotherapy   . Atherosclerosis of coronary artery cardiologist-  dr Nathaniel [name]   aorta and coronary arteries moderate per ct 02-07-2016  . Bladder calculi   .  BPH (benign prostatic hyperplasia)   . Calculi, urethra   . Chronic low back pain   . Constipation   . ED (erectile dysfunction)   . Edema of lower extremity   . Foley catheter in place   . History of adenomatous polyp of colon    2007  . History of basal cell carcinoma excision    multiple  . History of cellulitis    left lower leg-- resolved  . History of melanoma excision    temple right side/ multiple  . History of obstructive sleep apnea    per pt cured by hypnosis  . History of pericarditis    acute episode 09/ 2014  per  cardiologist note , dr Nathaniel [name] , related possibly to xarelto use  . History of ulcerative colitis    remote  . HTN (hypertension)   . Hypercholesteremia   . Hypothyroidism   . Incomplete right bundle branch block   . LAFB (left anterior fascicular block)   . OA (osteoarthritis)   . PAF (paroxysmal atrial fibrillation) [facility]) cardiologist-  dr Nathaniel [name]   dx 09/ 2014  in setting of acute pericarditis   . Rectal cancer [facility]) dx 07-16-2015  oncologist-  dr Nathaniel [name]   Stage IIIB (cT3, N1, M0)-- concurrent  chemotherapy started 08-12-2015/  radiation complete (08-12-2015 to 09-20-2015)  . S/P radiation therapy 08/12/15-09/20/15   rectal ca50.4Gy total dose  . Spinal stenosis     SURGICAL HISTORY: Past Surgical History:  Procedure Laterality Date  . CATARACT EXTRACTION W/ INTRAOCULAR LENS IMPLANT Bilateral 10/ 2016  . CYSTOSCOPY WITH LITHOLAPAXY N/A 02/12/2016   Procedure: CYSTOSCOPY WITH LITHOLAPAXY;  Surgeon: Nathaniel Frock, MD;  Location: [facility];  Service: Urology;  Laterality: N/A;  . EUS N/A 08/08/2015   Procedure: LOWER ENDOSCOPIC ULTRASOUND (EUS);  Surgeon: Nathaniel Silence, MD;  Location: [facility] ENDOSCOPY;  Service: Endoscopy;  Laterality: N/A;  . FLEXIBLE SIGMOIDOSCOPY N/A 08/08/2015   Procedure: FLEXIBLE SIGMOIDOSCOPY;  Surgeon: Nathaniel Silence, MD;  Location: [facility] ENDOSCOPY;  Service: Endoscopy;  Laterality: N/A;  poor prep  . HOLMIUM LASER APPLICATION N/A 02/07/3353   Procedure: HOLMIUM LASER APPLICATION;  Surgeon: Nathaniel Frock, MD;  Location: [facility];  Service: Urology;  Laterality: N/A;  . INGUINAL HERNIA REPAIR Left 1965  . LEFT KNEE SURGERY  1940  . MOHS SURGERY  x9  last one 2014   . ORIF LEFT ANKLE FX  1935  . ROTATOR CUFF REPAIR Bilateral right 2013/  left 2010  . TRANSTHORACIC ECHOCARDIOGRAM  06-06-2013   ef 55-60%/  mild AR/  mild dilated aortic root/  mild LAE and RAE/  mild to moderate TR/  small pericardial effusion identified     SOCIAL HISTORY: Social History   Social History  . Marital status: Married    Spouse name: N/A  . Number of children: N/A  . Years of education: N/A   Social History Main Topics  . Smoking status: Never Smoker  . Smokeless tobacco: Never Used  . Alcohol use 0.6 oz/week    1 Glasses of wine per week     Comment: 1 glass of wine per week  . Drug use: No  . Sexual activity: Not on file   Other Topics Concern  . 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 Stroke Father   . Cancer Mother        spine ,died of parkinson's    ALLERGIES:  is allergic to cinnamon; ibuprofen; percocet [oxycodone-acetaminophen]; protonix [pantoprazole sodium]; and xarelto [rivaroxaban].  MEDICATIONS:  Current Outpatient Prescriptions  Medication Sig Dispense Refill  . aspirin EC 325 MG tablet Take 325 mg by mouth daily.     . finasteride (PROSCAR) 5 MG tablet Take 5 mg by mouth every evening. Dose is taken at bedtime    . furosemide (LASIX) 20 MG tablet TAKE 1 TABLET BY MOUTH DAILY 90 tablet 1  . KLOR-CON 10 10 MEQ tablet TAKE 1 TABLET BY MOUTH DAILY 90 tablet 1  . levothyroxine (SYNTHROID, LEVOTHROID) 100 MCG tablet TAKE 1 TABLET ON AN EMPTY STOMACH IN THE MORNING ONCE A DAY  2  . metoprolol succinate (TOPROL-XL) 25 MG 24 hr tablet TAKE 1/2 TABLET BY MOUTH DAILY. 45 tablet 2  . Multiple Vitamin (MULTIVITAMIN WITH MINERALS) TABS tablet Take 1 tablet by mouth daily.     No current facility-administered medications for this visit.     REVIEW OF SYSTEMS:   Constitutional: Denies fevers,  chills or abnormal night sweats (+) slight weight gain  Eyes: Denies blurriness of vision, double vision or watery eyes Ears, nose, mouth, throat, and face: Denies mucositis or sore throat Respiratory: Denies cough, wheezes (+) occasional SOB  Cardiovascular: Denies palpitation, chest discomfort or lower extremity swelling Gastrointestinal:  Denies nausea, heartburn or change in bowel habits (+) stool insentience improved  Skin: Denies abnormal skin rashes Lymphatics: Denies new lymphadenopathy or easy bruising Neurological:Denies numbness, tingling or new weaknesses Behavioral/Psych: Mood is stable, no new changes  All other systems were reviewed with the patient and are negative.  PHYSICAL EXAMINATION: ECOG PERFORMANCE STATUS: 2-3  Vitals:   02/10/17 0929  BP: (!) 162/64  Pulse: 61  Resp: 17  Temp: 97.9 F (36.6 C)   Filed Weights   02/10/17 0929  Weight: 200 lb 1.6 oz (90.8 kg)    GENERAL:alert, no distress and comfortable SKIN: skin color, texture, turgor are normal, no rashes or significant lesions except surgical change on right cheek and nose  EYES: normal, conjunctiva are pink and non-injected, sclera clear OROPHARYNX:no exudate, no erythema and lips, buccal mucosa, and tongue normal  NECK: supple, thyroid normal size, non-tender, without nodularity LYMPH:  no palpable lymphadenopathy in the cervical, axillary or inguinal LUNGS: clear to auscultation and percussion with normal breathing effort HEART: regular rate & rhythm and no murmurs and no lower extremity edema ABDOMEN:abdomen soft, non-tender and normal bowel sounds. No enlarged inguinal lymph nodes. He was recently had rectal exam by rad/onc, and declined rectal exam today PSYCH: alert & oriented x 3 with fluent speech.  NEURO: no focal motor/sensory deficits   LABORATORY DATA:  I have reviewed the data as listed CBC Latest Ref Rng & Units 02/10/2017 10/14/2016 06/11/2016  WBC 4.0 - 10.3 10e3/uL 4.1 5.2 4.5   Hemoglobin 13.0 - 17.1 g/dL 13.7 13.6 12.6(L)  Hematocrit 38.4 - 49.9 % 40.5 42.4 38.2(L)  Platelets 140 - 400 10e3/uL 175 201 219    Recent Labs  02/12/16 1131 06/11/16 0944 10/14/16 1133 02/10/17 0906  NA 140 140 139 139  K 3.9 4.1  4.3 4.2  CL 103  --   --   --   CO2  --  27 28 27   GLUCOSE 95 101 88 106  BUN 11 14.9 17.0 17.7  CREATININE 0.70 0.8 1.0 1.0  CALCIUM  --  9.4 10.0 9.3  PROT  --  6.8 6.9 6.6  ALBUMIN  --  3.2* 3.8 3.6  AST  --  16 26 22   ALT  --  12 20 17   ALKPHOS  --  130 103 89  BILITOT  --  0.44 0.58 0.62    CEA (IN HOUSE-[facility])  Order: [id number]  Status:  Final result Visible to patient:  No (Not Released) Next appt:  04/13/2017 at 10:00 AM in Radiation Oncology Hayden Pedro, PA-C)    Ref Range & Units 1d ago 27mo ago 2mo ago   CEA ([facility]-In House) 0.00 - 5.00 ng/mL 4.28  5.90CM   3.44CM           PATHOLOGY REPORT  Diagnosis 07/16/2015  Rectum, biopsy, mass - INVASIVE ADENOCARCINOMA WITH SIGNET RING CELL FEATURES. - LYMPHOVASCULAR INVASION IS IDENTIFIED. - SEE COMMENT. Microscopic Comment To help evaluate the case, immunohistochemistry stains were performed. The malignant cells are positive for CDX2 and cytokeratin AE1/AE3. There is focal staining for CD56, a neuroendocrine marker. Prostein and S-100 stains are negative. Overall, the features are consistent with invasive adenocarcinoma of colonic origin, with signet ring cell features. The case was discussed with Dr. Cristina [name] on 07/18/15. (JBK:ds 07/18/15)   RADIOGRAPHIC STUDIES: I have personally reviewed the radiological images as listed and agreed with the findings in the report. No results found.   Flexible  Sigmoidoscopy 07/16/2015 by Dr. Cristina [name]  impression , an infiltrative no obstructing large mass was found in the distal rectum. The mass was partially circumferential (involving one third of the lumen circumference ). The mass measured 5 cm in length.  Biopsy was  obtained.  LOWER ENDOSCOPY [place] 08/08/2015  STAGING: T3 N1 Mx by endorectal ultrasound (limited study). ENDOSCOPIC IMPRESSION: Poor prep; unable to do colonoscopy. Rectal mass (adenocarcinoma on biopsies). Staging as above.  CT chest abdomen pelvis w contrast 02/07/2016 IMPRESSION: 1. Grossly stable anorectal wall thickening compared with previous study. No adjacent adenopathy or evidence of distant metastatic disease. 2. Multiple bladder calculi are again noted status post Foley catheter placement. In addition, there are multiple calculi within the urethra around the catheter. The prostate gland is moderately enlarged. 3. Moderate stool throughout the colon without evidence of obstruction. 4. Moderate atherosclerosis and severe left hip osteoarthritis again noted.   ASSESSMENT & PLAN:  83 y.o. Caucasian male, with past medical history of ulcerative colitis, hypertension and hypothyroidism, who lives in a independent living, presented with bowel habits change and rectal bleeding.  1. Low rectal adenocarcinoma, cT3N1M0, stage IIIB  -he is s/p concurrent chemotherapy and radiation, tolerated very well overall. His symptoms improved after treatment. -He declined surgery due to his advanced age and long recovery from surgery -I discussed that his rectal cancer is unlikely cured by neoadjuvant chemotherapy and radiation alone. He will likely grow again without surgical resection, which can cause obstruction, bleeding and pain. He may need diverging colostomy down the road.  -Rectal stent placement may not be an option due to the distal location -I discussed his restaging CT scan from 02/2016, which showed a stable rectal wall thickening, no metastatic adenopathy or distant metastasis. -I will obtain the report of his repeated  flexible sigmoidoscopy by Dr. Alycia Rossetti, per pt  ino significant residual tumor after chemo RT -He is clinically doing well,  physical exam was unremark. Lab results  reviewed with him, his CEA is back to normal (slightly elevated on last visit), will continue monitoring.  -Labs reviewed and blood count are normal and he is not anemic -His last CT scan was in June 2017. I recommend a surveillance CT scan before next visit in 3 months. He agrees to CT scan -Due to his advanced age, I may not offer chemotherapy if he has disease recurrence. We discussed the role of palliative radiation or other systemic therapy, such as immunotherapy, if needed. -Will f/u after CT scan W Contrast    2. Low back pain -secondary to spinal stenosis and rectal pain, It has resolved after he completed chemoirradiation  3. HTN, AF, arthritis -He will continue follow-up with his primary care physician -will watch his blood pressure and heart rate to closely during the concurrent chemoradiation.  4. Anemia -developed after chemoRT -resolved now, Hb 13.6 previously   5. Skin cancer including melanoma  -He will continue to follow-up with his dermatologist   Plan -Continue observation and supportive care -F/u in 3 months -CT scan one week before    All questions were answered. The patient knows to call the clinic with any problems, questions or concerns.  I spent 25 minutes counseling the patient face to face. The total time spent in the appointment was 30 minutes and more than 50% was on counseling.  This document serves as a record of services personally performed by Nathaniel Merle, MD. It was created on her behalf by Joslyn Devon, a trained medical scribe. The creation of this record is based on the scribe's personal observations and the provider's statements to them. This document has been checked and approved by the attending provider.     Nathaniel Merle, MD 02/10/2017 9:59 PM

## 2017-02-10 ENCOUNTER — Other Ambulatory Visit (HOSPITAL_BASED_OUTPATIENT_CLINIC_OR_DEPARTMENT_OTHER): Payer: Medicare Other

## 2017-02-10 ENCOUNTER — Telehealth: Payer: Self-pay | Admitting: Hematology

## 2017-02-10 ENCOUNTER — Ambulatory Visit (HOSPITAL_BASED_OUTPATIENT_CLINIC_OR_DEPARTMENT_OTHER): Payer: Medicare Other | Admitting: Hematology

## 2017-02-10 VITALS — BP 162/64 | HR 61 | Temp 97.9°F | Resp 17 | Ht 72.0 in | Wt 200.1 lb

## 2017-02-10 DIAGNOSIS — I10 Essential (primary) hypertension: Secondary | ICD-10-CM

## 2017-02-10 DIAGNOSIS — I4891 Unspecified atrial fibrillation: Secondary | ICD-10-CM

## 2017-02-10 DIAGNOSIS — C20 Malignant neoplasm of rectum: Secondary | ICD-10-CM | POA: Diagnosis not present

## 2017-02-10 DIAGNOSIS — M1990 Unspecified osteoarthritis, unspecified site: Secondary | ICD-10-CM | POA: Diagnosis not present

## 2017-02-10 DIAGNOSIS — Z85820 Personal history of malignant melanoma of skin: Secondary | ICD-10-CM | POA: Diagnosis not present

## 2017-02-10 DIAGNOSIS — I480 Paroxysmal atrial fibrillation: Secondary | ICD-10-CM

## 2017-02-10 LAB — COMPREHENSIVE METABOLIC PANEL
ALBUMIN: 3.6 g/dL (ref 3.5–5.0)
ALK PHOS: 89 U/L (ref 40–150)
ALT: 17 U/L (ref 0–55)
AST: 22 U/L (ref 5–34)
Anion Gap: 7 mEq/L (ref 3–11)
BUN: 17.7 mg/dL (ref 7.0–26.0)
CALCIUM: 9.3 mg/dL (ref 8.4–10.4)
CO2: 27 mEq/L (ref 22–29)
Chloride: 105 mEq/L (ref 98–109)
Creatinine: 1 mg/dL (ref 0.7–1.3)
EGFR: 66 mL/min/{1.73_m2} — AB (ref >90)
GLUCOSE: 106 mg/dL (ref 70–140)
POTASSIUM: 4.2 meq/L (ref 3.5–5.1)
SODIUM: 139 meq/L (ref 136–145)
Total Bilirubin: 0.62 mg/dL (ref 0.20–1.20)
Total Protein: 6.6 g/dL (ref 6.4–8.3)

## 2017-02-10 LAB — CEA (IN HOUSE-CHCC): CEA (CHCC-In House): 4.28 ng/mL (ref 0.00–5.00)

## 2017-02-10 LAB — CBC WITH DIFFERENTIAL/PLATELET
BASO%: 1 % (ref 0.0–2.0)
BASOS ABS: 0 10*3/uL (ref 0.0–0.1)
EOS%: 2.4 % (ref 0.0–7.0)
Eosinophils Absolute: 0.1 10*3/uL (ref 0.0–0.5)
HCT: 40.5 % (ref 38.4–49.9)
HGB: 13.7 g/dL (ref 13.0–17.1)
LYMPH#: 0.6 10*3/uL — AB (ref 0.9–3.3)
LYMPH%: 14.3 % (ref 14.0–49.0)
MCH: 32.4 pg (ref 27.2–33.4)
MCHC: 33.8 g/dL (ref 32.0–36.0)
MCV: 95.9 fL (ref 79.3–98.0)
MONO#: 0.6 10*3/uL (ref 0.1–0.9)
MONO%: 14 % (ref 0.0–14.0)
NEUT#: 2.8 10*3/uL (ref 1.5–6.5)
NEUT%: 68.3 % (ref 39.0–75.0)
Platelets: 175 10*3/uL (ref 140–400)
RBC: 4.22 10*6/uL (ref 4.20–5.82)
RDW: 14.1 % (ref 11.0–14.6)
WBC: 4.1 10*3/uL (ref 4.0–10.3)

## 2017-02-10 NOTE — Telephone Encounter (Signed)
F/u in three months - Scheduled appt per 6/6 los . Gave patient AVS and calender per 6/6 LOS . [facility] to contact patient when Ct order placed. -MD messaged

## 2017-02-11 ENCOUNTER — Encounter: Payer: Self-pay | Admitting: Hematology

## 2017-03-31 DIAGNOSIS — H52203 Unspecified astigmatism, bilateral: Secondary | ICD-10-CM | POA: Diagnosis not present

## 2017-03-31 DIAGNOSIS — H3500 Unspecified background retinopathy: Secondary | ICD-10-CM | POA: Diagnosis not present

## 2017-03-31 DIAGNOSIS — Z961 Presence of intraocular lens: Secondary | ICD-10-CM | POA: Diagnosis not present

## 2017-04-07 NOTE — Progress Notes (Signed)
[name]. [place] 83 y.o. with Low rectal adenocarcinoma, cT3N1M0, stage IIIB radiation completed 09-08-15 6 month FU.      Pain:Denies having pain today. Diarrhea:Denies diarrhea, having form stools every third day. Fatigue:Having fatigue most days. Skin changes:Denies having skin irritation. Urinary and Bladder changes:Reports urinary incontinence wears an adult depends. Rectal bleeding: Occasional rectal bleed.  GI doctor checked him out about six month said he had no problems that it was coming from radiation residue. Weight:  Wt Readings from Last 3 Encounters:  04/13/17 201 lb 6.4 oz (91.4 kg)  02/10/17 200 lb 1.6 oz (90.8 kg)  01/25/17 196 lb 12.8 oz (89.3 kg)  BP (!) 153/73   Pulse 64   Temp (!) 97.5 F (36.4 C) (Oral)   Resp 18   Ht (!) 6" (0.152 m)   Wt 201 lb 6.4 oz (91.4 kg)   SpO2 98%   BMI 3933.32 kg/m

## 2017-04-13 ENCOUNTER — Encounter: Payer: Self-pay | Admitting: Radiation Oncology

## 2017-04-13 ENCOUNTER — Ambulatory Visit
Admission: RE | Admit: 2017-04-13 | Discharge: 2017-04-13 | Disposition: A | Discharge status: Home / Self Care | Payer: Medicare Other | Source: Ambulatory Visit | Attending: Radiation Oncology | Admitting: Radiation Oncology

## 2017-04-13 VITALS — BP 153/73 | HR 64 | Temp 97.5°F | Resp 18 | Ht <= 58 in | Wt 201.4 lb

## 2017-04-13 DIAGNOSIS — I10 Essential (primary) hypertension: Secondary | ICD-10-CM | POA: Diagnosis not present

## 2017-04-13 DIAGNOSIS — Z7982 Long term (current) use of aspirin: Secondary | ICD-10-CM | POA: Diagnosis not present

## 2017-04-13 DIAGNOSIS — D6481 Anemia due to antineoplastic chemotherapy: Secondary | ICD-10-CM | POA: Diagnosis not present

## 2017-04-13 DIAGNOSIS — Z808 Family history of malignant neoplasm of other organs or systems: Secondary | ICD-10-CM | POA: Diagnosis not present

## 2017-04-13 DIAGNOSIS — Z809 Family history of malignant neoplasm, unspecified: Secondary | ICD-10-CM

## 2017-04-13 DIAGNOSIS — Z86010 Personal history of colonic polyps: Secondary | ICD-10-CM | POA: Insufficient documentation

## 2017-04-13 DIAGNOSIS — M1990 Unspecified osteoarthritis, unspecified site: Secondary | ICD-10-CM | POA: Diagnosis not present

## 2017-04-13 DIAGNOSIS — Z888 Allergy status to other drugs, medicaments and biological substances status: Secondary | ICD-10-CM | POA: Insufficient documentation

## 2017-04-13 DIAGNOSIS — G8929 Other chronic pain: Secondary | ICD-10-CM | POA: Insufficient documentation

## 2017-04-13 DIAGNOSIS — Z79899 Other long term (current) drug therapy: Secondary | ICD-10-CM | POA: Insufficient documentation

## 2017-04-13 DIAGNOSIS — Z823 Family history of stroke: Secondary | ICD-10-CM | POA: Insufficient documentation

## 2017-04-13 DIAGNOSIS — E7800 Pure hypercholesterolemia, unspecified: Secondary | ICD-10-CM | POA: Diagnosis not present

## 2017-04-13 DIAGNOSIS — M545 Low back pain: Secondary | ICD-10-CM | POA: Insufficient documentation

## 2017-04-13 DIAGNOSIS — Z87442 Personal history of urinary calculi: Secondary | ICD-10-CM | POA: Diagnosis not present

## 2017-04-13 DIAGNOSIS — R32 Unspecified urinary incontinence: Secondary | ICD-10-CM | POA: Diagnosis not present

## 2017-04-13 DIAGNOSIS — Z885 Allergy status to narcotic agent status: Secondary | ICD-10-CM | POA: Insufficient documentation

## 2017-04-13 DIAGNOSIS — Z886 Allergy status to analgesic agent status: Secondary | ICD-10-CM | POA: Diagnosis not present

## 2017-04-13 DIAGNOSIS — Z9889 Other specified postprocedural states: Secondary | ICD-10-CM | POA: Diagnosis not present

## 2017-04-13 DIAGNOSIS — G4733 Obstructive sleep apnea (adult) (pediatric): Secondary | ICD-10-CM | POA: Insufficient documentation

## 2017-04-13 DIAGNOSIS — I480 Paroxysmal atrial fibrillation: Secondary | ICD-10-CM | POA: Insufficient documentation

## 2017-04-13 DIAGNOSIS — C20 Malignant neoplasm of rectum: Secondary | ICD-10-CM | POA: Insufficient documentation

## 2017-04-13 DIAGNOSIS — N400 Enlarged prostate without lower urinary tract symptoms: Secondary | ICD-10-CM | POA: Insufficient documentation

## 2017-04-13 DIAGNOSIS — Z85820 Personal history of malignant melanoma of skin: Secondary | ICD-10-CM | POA: Diagnosis not present

## 2017-04-13 DIAGNOSIS — Z85048 Personal history of other malignant neoplasm of rectum, rectosigmoid junction, and anus: Secondary | ICD-10-CM | POA: Diagnosis not present

## 2017-04-13 DIAGNOSIS — Z08 Encounter for follow-up examination after completed treatment for malignant neoplasm: Secondary | ICD-10-CM | POA: Diagnosis not present

## 2017-04-13 HISTORY — DX: Malignant melanoma of skin, unspecified: C43.9

## 2017-04-13 NOTE — Addendum Note (Signed)
Encounter addended by: Malena Edman, RN on: 04/13/2017 12:34 PM<BR>    Actions taken: Charge Capture section accepted

## 2017-04-13 NOTE — Progress Notes (Signed)
Radiation Oncology         [phone] ________________________________  Name: Nathaniel [name] MRN: [id number]  Date: 04/13/2017  DOB: 09/08/1919  Follow-Up Visit Note  CC: [name], MD  [name], MD  Diagnosis:   Low rectal adenocarcinoma, cT3N1M0, stage IIIB   Interval Since Last Radiation:  19 months  08/12/15-09/17/15: 50.4 Gy total with 45 Gy to the rectum and a 5.4 Gy boost  Narrative:  The patient returns today for routine surveillance of  rectal cancer which was diagnosed in 2016 . He underwent chemotherapy sensitization with Xeloda and radiotherapy which he completed on 09/17/2015. He declined surgical resection his age and comorbidities, and understands that he will likely develop subsequent disease. His most recent CT scan on 02/07/2016  revealed grossly stable rectal wall thickening without evidence of adenopathy or distant disease. In the spring of this year he had an episode of rectal bleeding, he met with Dr. Harlene Ramus and sigmoidoscopy did not reveal concerns for disease,rather radiation proctitis.  He was seen by Dr. Burr Medico in June 2018 and had a CEA which was normal at 4.28, and at that time did not have any clinical evidence of disease. He comes today for follow up.                On review of systems, the patient reports that he is doing well overall. He denies any concerns with his bowel function. He continues to have episodes of urinary incontinence when he has a bowel movement. He denies any current blood in his stools, and denies any abdominal pain, nausea. No other complaints are verbalized.  Past Medical History:  Past Medical History:  Diagnosis Date  . Anemia due to antineoplastic chemotherapy   . Atherosclerosis of coronary artery cardiologist-  dr Aundra Dubin   aorta and coronary arteries moderate per ct 02-07-2016  . Bladder calculi   . BPH (benign prostatic hyperplasia)   . Calculi, urethra   . Chronic low back pain   . Constipation   . ED (erectile  dysfunction)   . Edema of lower extremity   . Foley catheter in place   . History of adenomatous polyp of colon    2007  . History of basal cell carcinoma excision    multiple  . History of cellulitis    left lower leg-- resolved  . History of melanoma excision    temple right side/ multiple  . History of obstructive sleep apnea    per pt cured by hypnosis  . History of pericarditis    acute episode 09/ 2014  per cardiologist note , dr Aundra Dubin , related possibly to xarelto use  . History of ulcerative colitis    remote  . HTN (hypertension)   . Hypercholesteremia   . Hypothyroidism   . Incomplete right bundle branch block   . LAFB (left anterior fascicular block)   . Melanoma ([place])   . OA (osteoarthritis)   . PAF (paroxysmal atrial fibrillation) [facility]) cardiologist-  dr dalton Aundra Dubin   dx 09/ 2014  in setting of acute pericarditis   . Rectal cancer [facility]) dx 07-16-2015  oncologist-  dr [name]   Stage IIIB (cT3, N1, M0)-- concurrent  chemotherapy started 08-12-2015/  radiation complete (08-12-2015 to 09-20-2015)  . S/P radiation therapy 08/12/15-09/20/15   rectal ca50.4Gy total dose  . Spinal stenosis     Past Surgical History: Past Surgical History:  Procedure Laterality Date  . CATARACT EXTRACTION W/ INTRAOCULAR LENS IMPLANT Bilateral 10/  2016  . CYSTOSCOPY WITH LITHOLAPAXY N/A 02/12/2016   Procedure: CYSTOSCOPY WITH LITHOLAPAXY;  Surgeon: Alexis Frock, MD;  Location: [facility];  Service: Urology;  Laterality: N/A;  . EUS N/A 08/08/2015   Procedure: LOWER ENDOSCOPIC ULTRASOUND (EUS);  Surgeon: Arta Silence, MD;  Location: [facility] ENDOSCOPY;  Service: Endoscopy;  Laterality: N/A;  . FLEXIBLE SIGMOIDOSCOPY N/A 08/08/2015   Procedure: FLEXIBLE SIGMOIDOSCOPY;  Surgeon: Arta Silence, MD;  Location: [facility] ENDOSCOPY;  Service: Endoscopy;  Laterality: N/A;  poor prep  . HOLMIUM LASER APPLICATION N/A 01/08/2705   Procedure: HOLMIUM LASER APPLICATION;  Surgeon: Alexis Frock, MD;  Location: [place];  Service: Urology;  Laterality: N/A;  . INGUINAL HERNIA REPAIR Left 1965  . LEFT KNEE SURGERY  1940  . MOHS SURGERY  x9  last one 2014   . ORIF LEFT ANKLE FX  1935  . ROTATOR CUFF REPAIR Bilateral right 2013/  left 2010  . TRANSTHORACIC ECHOCARDIOGRAM  06-06-2013   ef 55-60%/  mild AR/  mild dilated aortic root/  mild LAE and RAE/  mild to moderate TR/  small pericardial effusion identified    Social History:  Social History   Social History  . Marital status: Married    Spouse name: N/A  . Number of children: N/A  . Years of education: N/A   Occupational History  . Not on file.   Social History Main Topics  . Smoking status: Never Smoker  . Smokeless tobacco: Never Used  . Alcohol use 0.6 oz/week    1 Glasses of wine per week     Comment: 1 glass of wine per week  . Drug use: No  . Sexual activity: Not on file   Other Topics Concern  . 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 Stroke Father   . Cancer Mother        spine ,died of parkinson's  . Melanoma Brother        metastatic and secondary cancer    ALLERGIES:  is allergic to cinnamon; ibuprofen; percocet [oxycodone-acetaminophen]; protonix [pantoprazole sodium]; and xarelto [rivaroxaban].  Meds: Current Outpatient Prescriptions  Medication Sig Dispense Refill  . aspirin EC 325 MG tablet Take 325 mg by mouth daily.     . finasteride (PROSCAR) 5 MG tablet Take 5 mg by mouth every evening. Dose is taken at bedtime    . furosemide (LASIX) 20 MG tablet TAKE 1  TABLET BY MOUTH DAILY 90 tablet 1  . KLOR-CON 10 10 MEQ tablet TAKE 1 TABLET BY MOUTH DAILY 90 tablet 1  . levothyroxine (SYNTHROID, LEVOTHROID) 100 MCG tablet TAKE 1 TABLET ON AN EMPTY STOMACH IN THE MORNING ONCE A DAY  2  . metoprolol succinate (TOPROL-XL) 25 MG 24 hr tablet TAKE 1/2 TABLET BY MOUTH DAILY. 45 tablet 2  . Multiple Vitamin (MULTIVITAMIN WITH MINERALS) TABS tablet Take 1 tablet by mouth daily.     No current facility-administered medications for this encounter.     Physical Findings:  height is 6" (0.152 m) (abnormal) and weight  is 201 lb 6.4 oz (91.4 kg). His oral temperature is 97.5 F (36.4 C) (abnormal). His blood pressure is 153/73 (abnormal) and his pulse is 64. His respiration is 18 and oxygen saturation is 98%. .   In general this is a well appearing though elderly Caucasian male in no acute distress. He is alert and oriented x4 and appropriate throughout the examination. HEENT reveals that the patient is normocephalic, atraumatic. EOMs are intact. PERRLA. Skin is intact without any evidence of gross lesions.  Cardiopulmonary assessment is negative for acute distress and he exhibits normal effort.   Lab Findings: Lab Results  Component Value Date   WBC 4.1 02/10/2017   HGB 13.7 02/10/2017   HCT 40.5 02/10/2017   MCV 95.9 02/10/2017   PLT 175 02/10/2017     Radiographic Findings: No results found.  Impression/Plan: 1. Low rectal adenocarcinoma, cT3N1M0, stage IIIB. The patient is doing well clinically. He's been seen in the last 3 months by Dr. Cristina Gong and had a sigmoidoscopy that was reassuring in his office. He's also been seen by Dr. Burr Medico recently and is planning to proceed with CT imaging and CEA testing in September. For these reasons I did not put him through an examination today. Rather, we spent time talking about the NCCN guidelines and outlined options for him to continue in this manner for surveillance. However, as he will be approaching 2 year's since  therapy this coming January, we can follow him as needed. He is very much aware of his age and how this plays into anticipating longevity. He is of the mind that he would like to continue surveillance as long as he's able to physically attend appointments, and that he isn't having other comorbidities keeping him from assessments. He is in agreement with this plan and is given a copy of the NCCN guidelines. 2. Possible genetic predisposition to malignancy. The patient's history and his brother's history of metastatic melanoma along with a second malignancy may be genetically linked. I discussed that his age however is also a risk factor. He is interested in meeting with the genetic counselors to discuss the possibilities of testing for [facility]. A referral will be made.   In a visit lasting 25 minutes, greater than 50% of the time was spent face to face discussing NCCN guidelines and how they play a role in surveillance, and coordinating the patient's care.    Carola Rhine, PAC

## 2017-04-16 ENCOUNTER — Telehealth: Payer: Self-pay | Admitting: Hematology

## 2017-04-16 NOTE — Telephone Encounter (Signed)
Scheduled appt per 8/10 sch message - patient is aware of appt scheduled.

## 2017-05-03 ENCOUNTER — Telehealth: Payer: Self-pay

## 2017-05-03 NOTE — Telephone Encounter (Signed)
Called patient and he is aware of his new lab appt per 8/24 inbasket  Nathaniel [name]

## 2017-05-06 ENCOUNTER — Other Ambulatory Visit (HOSPITAL_BASED_OUTPATIENT_CLINIC_OR_DEPARTMENT_OTHER): Payer: Medicare Other

## 2017-05-06 DIAGNOSIS — C20 Malignant neoplasm of rectum: Secondary | ICD-10-CM

## 2017-05-06 LAB — CBC WITH DIFFERENTIAL/PLATELET
BASO%: 0.7 % (ref 0.0–2.0)
BASOS ABS: 0 10*3/uL (ref 0.0–0.1)
EOS ABS: 0.1 10*3/uL (ref 0.0–0.5)
EOS%: 2.2 % (ref 0.0–7.0)
HCT: 40.5 % (ref 38.4–49.9)
HEMOGLOBIN: 13.6 g/dL (ref 13.0–17.1)
LYMPH%: 16.3 % (ref 14.0–49.0)
MCH: 32.2 pg (ref 27.2–33.4)
MCHC: 33.6 g/dL (ref 32.0–36.0)
MCV: 96 fL (ref 79.3–98.0)
MONO#: 0.7 10*3/uL (ref 0.1–0.9)
MONO%: 16 % — AB (ref 0.0–14.0)
NEUT#: 3 10*3/uL (ref 1.5–6.5)
NEUT%: 64.8 % (ref 39.0–75.0)
Platelets: 178 10*3/uL (ref 140–400)
RBC: 4.22 10*6/uL (ref 4.20–5.82)
RDW: 14.3 % (ref 11.0–14.6)
WBC: 4.7 10*3/uL (ref 4.0–10.3)
lymph#: 0.8 10*3/uL — ABNORMAL LOW (ref 0.9–3.3)

## 2017-05-06 LAB — COMPREHENSIVE METABOLIC PANEL WITH GFR
ALT: 17 U/L (ref 0–55)
AST: 22 U/L (ref 5–34)
Albumin: 3.4 g/dL — ABNORMAL LOW (ref 3.5–5.0)
Alkaline Phosphatase: 101 U/L (ref 40–150)
Anion Gap: 8 meq/L (ref 3–11)
BUN: 21.4 mg/dL (ref 7.0–26.0)
CO2: 27 meq/L (ref 22–29)
Calcium: 9.4 mg/dL (ref 8.4–10.4)
Chloride: 105 meq/L (ref 98–109)
Creatinine: 1 mg/dL (ref 0.7–1.3)
EGFR: 61 ml/min/1.73 m2 — ABNORMAL LOW
Glucose: 113 mg/dL (ref 70–140)
Potassium: 4.4 meq/L (ref 3.5–5.1)
Sodium: 140 meq/L (ref 136–145)
Total Bilirubin: 0.57 mg/dL (ref 0.20–1.20)
Total Protein: 6.5 g/dL (ref 6.4–8.3)

## 2017-05-11 ENCOUNTER — Ambulatory Visit (HOSPITAL_COMMUNITY)
Admission: RE | Admit: 2017-05-11 | Discharge: 2017-05-11 | Disposition: A | Discharge status: Home / Self Care | Payer: Medicare Other | Source: Ambulatory Visit | Attending: Hematology | Admitting: Hematology

## 2017-05-11 ENCOUNTER — Encounter (HOSPITAL_COMMUNITY): Payer: Self-pay

## 2017-05-11 ENCOUNTER — Other Ambulatory Visit: Payer: Medicare Other

## 2017-05-11 DIAGNOSIS — C20 Malignant neoplasm of rectum: Secondary | ICD-10-CM

## 2017-05-11 DIAGNOSIS — K4090 Unilateral inguinal hernia, without obstruction or gangrene, not specified as recurrent: Secondary | ICD-10-CM | POA: Insufficient documentation

## 2017-05-11 DIAGNOSIS — Z923 Personal history of irradiation: Secondary | ICD-10-CM | POA: Diagnosis not present

## 2017-05-11 DIAGNOSIS — K5900 Constipation, unspecified: Secondary | ICD-10-CM | POA: Insufficient documentation

## 2017-05-11 DIAGNOSIS — N400 Enlarged prostate without lower urinary tract symptoms: Secondary | ICD-10-CM | POA: Insufficient documentation

## 2017-05-11 DIAGNOSIS — I700 Atherosclerosis of aorta: Secondary | ICD-10-CM | POA: Diagnosis not present

## 2017-05-11 DIAGNOSIS — I2510 Atherosclerotic heart disease of native coronary artery without angina pectoris: Secondary | ICD-10-CM | POA: Diagnosis not present

## 2017-05-11 DIAGNOSIS — R590 Localized enlarged lymph nodes: Secondary | ICD-10-CM | POA: Diagnosis not present

## 2017-05-11 MED ORDER — IOPAMIDOL (ISOVUE-300) INJECTION 61%
INTRAVENOUS | Status: AC
Start: 2017-05-11 — End: 2017-05-11
  Filled 2017-05-11: qty 100

## 2017-05-11 MED ORDER — IOPAMIDOL (ISOVUE-300) INJECTION 61%
100.0000 mL | Freq: Once | INTRAVENOUS | Status: AC | PRN
  Administered 2017-05-11: 100 mL via INTRAVENOUS

## 2017-05-11 NOTE — Progress Notes (Signed)
[place]  [phone] [phone]  Clinic follow up Note   Patient Care Team: [name], MD as PCP - General (Family Medicine) Ronald Lobo, MD as Consulting Physician (Gastroenterology) Tania Ade, RN as Registered Nurse 05/13/17  CHIEF COMPLAINTS:  Follow up rectal cancer  Oncology History    Rectal cancer [facility])   Staging form: Colon and Rectum, AJCC 7th Edition     Clinical stage from 08/08/2015: Stage IIIB (T3, N1, M0) - Unsigned       Rectal cancer ([place])   07/16/2015 Pathology Results    Biopsy positive for invasive adenocarcinoma with signet ring cell features      07/23/2015 Initial Diagnosis    Rectal cancer ([place])      08/12/2015 Concurrent Chemotherapy    Xeloda 1500mg  q12h on the day of radiation       08/12/2015 - 09/20/2015 Radiation Therapy    radiaiton to rectal cancer       02/18/2016 Procedure    FLEX SIGMOIDOSCOPY: Endoscopic disappearance of previous rectal mass; atrophic mucosa in distal rectum  per Dr. Cristina Gong      02/20/2016 Pathology Results    No adenomatous change or malignancy      05/11/2017 Imaging    CT CAP IMPRESSION: 1. Newly enlarged 1.3 cm in short axis aortocaval node, while not entirely specific, is concerning for recurrent malignancy. No other indicators of recurrence identified. Biopsy or nuclear medicine PET-CT could be utilized to provide greater specificity if clinically warranted. 2. Continued abnormal wall thickening in the inferior rectum. Surrounding perirectal and presacral stranding likely from prior radiation therapy. 3. Prostatomegaly, prostate volume 93 cubic cm. 4. Other imaging findings of potential clinical significance: Aortic Atherosclerosis (ICD10-I70.0). Coronary atherosclerosis with mild cardiomegaly. Markedly severe chronic arthropathy in the left hip. Suspected cyst in segment 6 of the liver, unchanged. Prominent stool throughout the colon favors constipation.  Direct right inguinal hernia contains adipose tissue. Possible retraction of the left testicle. 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        HISTORY OF PRESENTING ILLNESS:  Nathaniel [name] 83 y.o. male is here because of recently diagnosed rectal cancer. He presents to the clinic by himself.  He has had abnormal bowel movement for the past 2-3 months. It's usually very small amount, loose, every time he urinates, he has a such a small bowel movement. He does not really have a good normal bowel movement. He also reports frequent stool stains on his underwear. He noticed small amount blood in his stool recently, which prompt his seeing GI Dr. Cristina Gong on. He underwent a flexible sigmoidoscopy in the office, which showed a rectal mass, biopsy reviewed adenocarcinoma. He denies significant rectal pain, abdominal discomfort, or other new complaints.  He lives in a independent living facility, he is able to take care of his all ADLs, and does some light housework, shopping, by himself. He uses a walker, still drives. He does get fatigued after activity, his energy level has seemed to be decreasing in over the past few years. No significant change daily. He has decent appetite and eats well.  He had remote history of ulcerative colitis, which has been in remission. 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  He is widowed, has 5 children, who all live in out states. He does have several stepchildren from his secondary to, and some of them live in [place].  CURRENT TREATMENT: observation  INTERIM HISTORY Nathaniel [name] returns for follow-up today and reports that he is doing well overall. He is ambulating with a walker for short distance and uses electric wheelchair for long distances. He resides in [place]. He reports that he has a remote hx of ulcerative colitis at age 28. He has an appointment with genetic  counseling today. He notes that he has 4 daughters and 2 sons.   On review of systems, he reports watery stools yesterday that resolved. Denies other bowel issues. Denies abdominal pain or nausea. Denies falls.    MEDICAL HISTORY:  Past Medical History:  Diagnosis Date  . Anemia due to antineoplastic chemotherapy   . Atherosclerosis of coronary artery cardiologist-  dr Aundra Dubin   aorta and coronary arteries moderate per ct 02-07-2016  . Bladder calculi   . BPH (benign prostatic hyperplasia)   . Calculi, urethra   . Chronic low back pain   . Constipation   . ED (erectile dysfunction)   . Edema of lower extremity   . Foley catheter in place   . History of adenomatous polyp of colon    2007  . History of basal cell carcinoma excision    multiple  . History of cellulitis    left lower leg-- resolved  . History of melanoma excision    temple right side/ multiple  . History of obstructive sleep apnea    per pt cured by hypnosis  . History of pericarditis    acute episode 09/ 2014  per cardiologist note , dr Aundra Dubin , related possibly to xarelto use  . History of ulcerative colitis    remote  . HTN (hypertension)   . Hypercholesteremia   . Hypothyroidism   . Incomplete right bundle branch block   . LAFB (left anterior fascicular block)   . Melanoma ([place])   . OA (osteoarthritis)   . PAF (paroxysmal atrial fibrillation) [facility]) cardiologist-  dr dal[place] Aundra Dubin   dx 09/ 2014  in setting of acute pericarditis   . Rectal cancer [facility]) dx 07-16-2015  oncologist-  dr [name]   Stage IIIB (cT3, N1, M0)-- concurrent  chemotherapy started 08-12-2015/  radiation complete (08-12-2015 to 09-20-2015)  . S/P radiation therapy 08/12/15-09/20/15   rectal ca50.4Gy total dose  . Spinal stenosis     SURGICAL HISTORY: Past Surgical History:  Procedure Laterality Date  . CATARACT EXTRACTION W/ INTRAOCULAR LENS IMPLANT Bilateral 10/ 2016  . CYSTOSCOPY WITH LITHOLAPAXY N/A 02/12/2016   Procedure:  CYSTOSCOPY WITH LITHOLAPAXY;  Surgeon: Alexis Frock, MD;  Location: [facility];  Service: Urology;  Laterality: N/A;  . EUS N/A 08/08/2015   Procedure: LOWER ENDOSCOPIC ULTRASOUND (EUS);  Surgeon: Arta Silence, MD;  Location[facility] END[facility]COPY;  Service: Endoscopy;  Laterality: N/A;  . FLEXIBLE SIGMOIDOSCOPY N/A 08/08/2015   Procedure: FLEXIBLE SIGMOIDOSCOPY;  Surgeon: Arta Silence, MD;  Location: [facility] ENDOSCOPY;  Service: Endoscopy;  Laterality: N/A;  poor prep  . HOLMIUM LASER APPLICATION N/A 09/10/4816   Procedure: HOLMIUM LASER APPLICATION;  Surgeon: Alexis Frock, MD;  Location: [facility];  Service: Urology;  Laterality: N/A;  . INGUINAL HERNIA REPAIR Left 1965  . LEFT KNEE SURGERY  1940  . MOHS SURGERY  x9  last one 2014   . ORIF LEFT ANKLE FX  1935  . ROTATOR CUFF REPAIR Bilateral right 2013/  left 2010  . TRANSTHORACIC ECHOCARDIOGRAM  06-06-2013   ef 55-60%/  mild AR/  mild dilated aortic root/  mild LAE and RAE/  mild to moderate TR/  small pericardial effusion identified    SOCIAL HISTORY: Social History   Social History  . Marital status: Married    Spouse name: N/A  . Number of children: N/A  . Years of education: N/A   Social History Main Topics  . Smoking status: Never Smoker  . Smokeless tobacco: Never Used  . Alcohol use 0.6 oz/week    1 Glasses of wine per week     Comment: 1 glass of wine per week  . Drug use: No  . Sexual activity: Not on file   Other Topics Concern  . 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 Stroke  Father   . Cancer Mother        spine ,died of parkinson's  . Melanoma Brother        metastatic and secondary cancer    ALLERGIES:  is allergic to cinnamon; ibuprofen; percocet [oxycodone-acetaminophen]; protonix [pantoprazole sodium]; and xarelto [rivaroxaban].  MEDICATIONS:  Current Outpatient Prescriptions  Medication Sig Dispense Refill  . aspirin EC 325 MG tablet Take 325 mg by mouth daily.     . finasteride (PROSCAR) 5 MG tablet Take 5 mg by mouth every evening. Dose is taken at bedtime    . furosemide (LASIX) 20 MG tablet TAKE 1 TABLET BY MOUTH DAILY 90 tablet 1  . KLOR-CON 10 10 MEQ tablet TAKE 1 TABLET BY MOUTH DAILY 90 tablet 1  . levothyroxine (SYNTHROID, LEVOTHROID) 100 MCG tablet TAKE 1 TABLET ON AN EMPTY STOMACH IN THE MORNING ONCE A DAY  2  . metoprolol succinate (TOPROL-XL) 25 MG 24 hr tablet TAKE 1/2 TABLET BY MOUTH DAILY. 45 tablet 2  . Multiple Vitamin (MULTIVITAMIN WITH MINERALS) TABS tablet Take 1 tablet by mouth daily.     No current facility-administered medications for this visit.    Facility-Administered Medications Ordered in Other Visits  Medication Dose Route Frequency Provider Last Rate Last Dose  . iopamidol (ISOVUE-300) 61 % injection             REVIEW OF SYSTEMS:   Constitutional: Denies fevers, chills or abnormal night sweats (+) stable weight Eyes: Denies blurriness of vision, double vision or watery eyes Ears, nose, mouth, throat, and face: Denies mucositis or sore throat Respiratory: Denies cough, wheezes (+) occasional SOB  Cardiovascular: Denies palpitation, chest discomfort or lower extremity swelling Gastrointestinal:  Denies nausea, heartburn or change in bowel habits (+) watery stool on 05/12/17 now resolved  Skin: Denies abnormal skin rashes Lymphatics: Denies new lymphadenopathy or easy bruising Neurological:Denies numbness, tingling or new weaknesses Behavioral/Psych: Mood is stable, no new changes  All other systems were reviewed  with the patient and are negative.  PHYSICAL EXAMINATION:  ECOG PERFORMANCE STATUS: 2-3 BP (!) 174/75 (BP Location: Right Arm, Patient Position: Sitting) Comment: nurse aware  Pulse 67   Temp 97.7 F (36.5 C) (Oral)   Resp 18   Wt 198 lb (89.8 kg)   SpO2 97%   BMI 3866.92 kg/m   GENERAL:alert, no distress and comfortable SKIN: skin color,  texture, turgor are normal, no rashes or significant lesions except surgical change on right cheek and nose  EYES: normal, conjunctiva are pink and non-injected, sclera clear OROPHARYNX:no exudate, no erythema and lips, buccal mucosa, and tongue normal  NECK: supple, thyroid normal size, non-tender, without nodularity LYMPH:  no palpable lymphadenopathy in the cervical, axillary or inguinal LUNGS: clear to auscultation and percussion with normal breathing effort HEART: regular rate & rhythm and no murmurs and no lower extremity edema ABDOMEN:abdomen soft, non-tender and normal bowel sounds. No enlarged inguinal lymph nodes. He was recently had rectal exam by rad/onc, and declined rectal exam today PSYCH: alert & oriented x 3 with fluent speech.  NEURO: no focal motor/sensory deficits   LABORATORY DATA:  I have reviewed the data as listed  CBC Latest Ref Rng & Units 05/13/2017 05/06/2017 02/10/2017  WBC 4.0 - 10.3 10e3/uL 4.4 4.7 4.1  Hemoglobin 13.0 - 17.1 g/dL 13.7 13.6 13.7  Hematocrit 38.4 - 49.9 % 41.5 40.5 40.5  Platelets 140 - 400 10e3/uL 186 178 175   CMP Latest Ref Rng & Units 05/13/2017 05/06/2017 02/10/2017  Glucose 70 - 140 mg/dl 94 113 106  BUN 7.0 - 26.0 mg/dL 13.0 21.4 17.7  Creatinine 0.7 - 1.3 mg/dL 1.0 1.0 1.0  Sodium 136 - 145 mEq/L 140 140 139  Potassium 3.5 - 5.1 mEq/L 4.4 4.4 4.2  Chloride 101 - 111 mmol/L - - -  CO2 22 - 29 mEq/L 27 27 27   Calcium 8.4 - 10.4 mg/dL 9.8 9.4 9.3  Total Protein 6.4 - 8.3 g/dL 7.0 6.5 6.6  Total Bilirubin 0.20 - 1.20 mg/dL 0.53 0.57 0.62  Alkaline Phos 40 - 150 U/L 99 101 89  AST 5 - 34 U/L 26  22 22   ALT 0 - 55 U/L 16 17 17    CEA (IN HOUSE-[facility])  Order: [id number]  Status:  Final result Visible to patient:  No (Not Released) Next appt:  04/13/2017 at 10:00 AM in Radiation Oncology Hayden Pedro, PA-C)    Ref Range & Units 3 mo ago 46mo ago 72mo ago   CEA ([facility]-In House) 0.00 - 5.00 ng/mL 4.28  5.90CM   3.44CM        CEA 05/13/17 pending  PATHOLOGY REPORT  Diagnosis 07/16/2015  Rectum, biopsy, mass - INVASIVE ADENOCARCINOMA WITH SIGNET RING CELL FEATURES. - LYMPHOVASCULAR INVASION IS IDENTIFIED. - SEE COMMENT. Microscopic Comment To help evaluate the case, immunohistochemistry stains were performed. The malignant cells are positive for CDX2 and cytokeratin AE1/AE3. There is focal staining for CD56, a neuroendocrine marker. Prostein and S-100 stains are negative. Overall, the features are consistent with invasive adenocarcinoma of colonic origin, with signet ring cell features. The case was discussed with Dr. Cristina Gong on 07/18/15. (JBK:ds 07/18/15)  RADIOGRAPHIC STUDIES: I have personally reviewed the radiological images as listed and agreed with the findings in the report. Ct Chest W Contrast  Result Date: 05/11/2017 CLINICAL DATA:  Rectal cancer restaging. Prior chemotherapy and radiation therapy. EXAM: CT CHEST, ABDOMEN, AND PELVIS WITH CONTRAST TECHNIQUE: Multidetector CT imaging of the chest, abdomen and pelvis was performed following the standard protocol during bolus administration of intravenous contrast. CONTRAST:  120mL ISOVUE-300 IOPAMIDOL (ISOVUE-300) INJECTION 61% COMPARISON:  Multiple exams, including 02/07/2016 FINDINGS: CT CHEST FINDINGS Cardiovascular: Coronary, aortic arch, and branch vessel atherosclerotic vascular disease. Mild cardiomegaly. Mediastinum/Nodes: No pathologic thoracic adenopathy. Mild distal esophageal wall thickening. Lungs/Pleura: Left lower lobe scarring along the hemidiaphragm. No worrisome pulmonary nodules. Musculoskeletal: Thoracic  spondylosis. CT ABDOMEN  PELVIS FINDINGS Hepatobiliary: Contracted gallbladder. Hypodense lesion in segment 6 of the liver, 1.1 by 0.6 cm on image 68/2, unchanged and compatible with benign cyst. Pancreas: Unremarkable Spleen: Unremarkable Adrenals/Urinary Tract: Prior bladder calculi are currently absent. Borderline urinary bladder wall thickening. Otherwise unremarkable. Stomach/Bowel: Prominent stool throughout the colon favors constipation. Lower rectal wall thickening as on image 118/2. Perirectal stranding. Mild presacral stranding. Vascular/Lymphatic: 1.3 cm in short axis aortocaval lymph node on image 82/2, formerly 0.3 cm in short axis. Aortoiliac atherosclerotic vascular disease. Small chronic unchanged dissection in the right proximal common iliac artery. Reproductive: The prostate gland measures 5.1 by 5.7 by 6.1 cm (volume = 93 cm^3). Mild para prostatic stranding may be therapy related. Suspected retracted left testicle just outside the inguinal ring. Other: Continued perirectal stranding and presacral stranding. This may well be therapy related. Musculoskeletal: Direct right inguinal hernia contains adipose tissue. Markedly severe chronic arthropathy of the left hip. IMPRESSION: 1. Newly enlarged 1.3 cm in short axis aortocaval node, while not entirely specific, is concerning for recurrent malignancy. No other indicators of recurrence identified. Biopsy or nuclear medicine PET-CT could be utilized to provide greater specificity if clinically warranted. 2. Continued abnormal wall thickening in the inferior rectum. Surrounding perirectal and presacral stranding likely from prior radiation therapy. 3. Prostatomegaly, prostate volume 93 cubic cm. 4. Other imaging findings of potential clinical significance: Aortic Atherosclerosis (ICD10-I70.0). Coronary atherosclerosis with mild cardiomegaly. Markedly severe chronic arthropathy in the left hip. Suspected cyst in segment 6 of the liver, unchanged. Prominent  stool throughout the colon favors constipation. Direct right inguinal hernia contains adipose tissue. Possible retraction of the left testicle. 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 Electronically Signed   By: Van Clines M.D.   On: 05/11/2017 11:32   Ct Abdomen Pelvis W Contrast  Result Date: 05/11/2017 CLINICAL DATA:  Rectal cancer restaging. Prior chemotherapy and radiation therapy. EXAM: CT CHEST, ABDOMEN, AND PELVIS WITH CONTRAST TECHNIQUE: Multidetector CT imaging of the chest, abdomen and pelvis was performed following the standard protocol during bolus administration of intravenous contrast. CONTRAST:  118mL ISOVUE-300 IOPAMIDOL (ISOVUE-300) INJECTION 61% COMPARISON:  Multiple exams, including 02/07/2016 FINDINGS: CT CHEST FINDINGS Cardiovascular: Coronary, aortic arch, and branch vessel atherosclerotic vascular disease. Mild cardiomegaly. Mediastinum/Nodes: No pathologic thoracic adenopathy. Mild distal esophageal wall thickening. Lungs/Pleura: Left lower lobe scarring along the hemidiaphragm. No worrisome pulmonary nodules. Musculoskeletal: Thoracic spondylosis. CT ABDOMEN PELVIS FINDINGS Hepatobiliary: Contracted gallbladder. Hypodense lesion in segment 6 of the liver, 1.1 by 0.6 cm on image 68/2, unchanged and compatible with benign cyst. Pancreas: Unremarkable Spleen: Unremarkable Adrenals/Urinary Tract: Prior bladder calculi are currently absent. Borderline urinary bladder wall thickening. Otherwise unremarkable. Stomach/Bowel: Prominent stool throughout the colon favors constipation. Lower rectal wall thickening as on image 118/2. Perirectal stranding. Mild presacral stranding. Vascular/Lymphatic: 1.3 cm in short axis aortocaval lymph node on image 82/2, formerly 0.3 cm in short axis. Aortoiliac atherosclerotic vascular disease. Small chronic unchanged dissection in the right proximal common iliac artery. Reproductive: The prostate gland measures 5.1 by 5.7 by 6.1  cm (volume = 93 cm^3). Mild para prostatic stranding may be therapy related. Suspected retracted left testicle just outside the inguinal ring. Other: Continued perirectal stranding and presacral stranding. This may well be therapy related. Musculoskeletal: Direct right inguinal hernia contains adipose tissue. Markedly severe chronic arthropathy of the left hip. IMPRESSION: 1. Newly enlarged 1.3 cm in short axis aortocaval node, while not entirely specific, is concerning for recurrent malignancy. No other indicators of recurrence  identified. Biopsy or nuclear medicine PET-CT could be utilized to provide greater specificity if clinically warranted. 2. Continued abnormal wall thickening in the inferior rectum. Surrounding perirectal and presacral stranding likely from prior radiation therapy. 3. Prostatomegaly, prostate volume 93 cubic cm. 4. Other imaging findings of potential clinical significance: Aortic Atherosclerosis (ICD10-I70.0). Coronary atherosclerosis with mild cardiomegaly. Markedly severe chronic arthropathy in the left hip. Suspected cyst in segment 6 of the liver, unchanged. Prominent stool throughout the colon favors constipation. Direct right inguinal hernia contains adipose tissue. Possible retraction of the left testicle. 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 Electronically Signed   By: Van Clines M.D.   On: 05/11/2017 11:32    Flexible  Sigmoidoscopy 07/16/2015 by Dr. Cristina Gong  impression , an infiltrative no obstructing large mass was found in the distal rectum. The mass was partially circumferential (involving one third of the lumen circumference ). The mass measured 5 cm in length.  Biopsy was obtained.  LOWER ENDOSCOPY [place] 08/08/2015  STAGING: T3 N1 Mx by endorectal ultrasound (limited study). ENDOSCOPIC IMPRESSION: Poor prep; unable to do colonoscopy. Rectal mass (adenocarcinoma on biopsies). Staging as above.  CT chest abdomen pelvis w contrast  02/07/2016 IMPRESSION: 1. Grossly stable anorectal wall thickening compared with previous study. No adjacent adenopathy or evidence of distant metastatic disease. 2. Multiple bladder calculi are again noted status post Foley catheter placement. In addition, there are multiple calculi within the urethra around the catheter. The prostate gland is moderately enlarged. 3. Moderate stool throughout the colon without evidence of obstruction. 4. Moderate atherosclerosis and severe left hip osteoarthritis again noted.  CT CAP 05/11/2017 IMPRESSION: 1. Newly enlarged 1.3 cm in short axis aortocaval node, while not entirely specific, is concerning for recurrent malignancy. No other indicators of recurrence identified. Biopsy or nuclear medicine PET-CT could be utilized to provide greater specificity if clinically warranted. 2. Continued abnormal wall thickening in the inferior rectum. Surrounding perirectal and presacral stranding likely from prior radiation therapy. 3. Prostatomegaly, prostate volume 93 cubic cm. 4. Other imaging findings of potential clinical significance: Aortic Atherosclerosis (ICD10-I70.0). Coronary atherosclerosis with mild cardiomegaly. Markedly severe chronic arthropathy in the left hip. Suspected cyst in segment 6 of the liver, unchanged. Prominent stool throughout the colon favors constipation. Direct right inguinal hernia contains adipose tissue. Possible retraction of the left testicle. 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  ASSESSMENT & PLAN:  83 y.o. Caucasian male, with past medical history of ulcerative colitis, hypertension and hypothyroidism, who lives in a independent living, presented with bowel habits change and rectal bleeding.  1. Low rectal adenocarcinoma, cT3N1M0, stage IIIB  -he is s/p concurrent chemotherapy and radiation, tolerated very well overall. His symptoms improved after treatment. -He declined surgery due to his advanced age and  long recovery from surgery -I previously discussed that his rectal cancer is unlikely cured by neoadjuvant chemotherapy and radiation alone. He will likely grow again without surgical resection, which can cause obstruction, bleeding and pain. He may need diverging colostomy down the road. Rectal stent placement may not be an option due to the distal location -flexible sigmoidoscopy by Dr. Alycia Rossetti, per pt ino significant residual tumor after chemo RT -He is clinically doing well,  physical exam was unremarkable. Labs reviewed with patient, blood count normal   -I reviewed his CT CAP from 05/11/17 which shows newly enlarged 1.3 cm aortocaval node concerning for recurrence. No other mets -Due to his advanced age, I previously discussed that I  may not offer chemotherapy if he has disease recurrence. We discussed the role of palliative radiation or other systemic therapy, such as immunotherapy, if needed. -I recommend observation and repeating CT AP scan in 2 months for close f/u and re-evaluation of aortocaval node. -His clinically doing very well, continue to recover from his previous radiation chemotherapy, asymptomatic, lab is otherwise unremarkable. We'll continue observation.  2. Low back pain -secondary to spinal stenosis and rectal pain, It has resolved after he completed chemoirradiation  3. HTN, AF, arthritis -He will continue follow-up with his primary care physician -will watch his blood pressure and heart rate to closely during the concurrent chemoradiation.  4. Anemia -developed after chemoRT -Hgb overall stable, will monitor.  5. Skin cancer including melanoma  -He will continue to follow-up with his dermatologist   Plan -Follow up in 2 months -Repeat CT AP prior to next visit -genetic counseling apt today  All questions were answered. The patient knows to call the clinic with any problems, questions or concerns.  I spent 20 minutes counseling the patient face to face. The total  time spent in the appointment was 25  minutes and more than 50% was on counseling.  This document serves as a record of services personally performed by [name], MD. It was created on her behalf by Steva Colder, a trained medical scribe. The creation of this record is based on the scribe's personal observations and the provider's statements to them. This document has been checked and approved by the attending provider.     [name], MD 05/13/2017

## 2017-05-12 DIAGNOSIS — L814 Other melanin hyperpigmentation: Secondary | ICD-10-CM | POA: Diagnosis not present

## 2017-05-12 DIAGNOSIS — L570 Actinic keratosis: Secondary | ICD-10-CM | POA: Diagnosis not present

## 2017-05-12 DIAGNOSIS — Z86018 Personal history of other benign neoplasm: Secondary | ICD-10-CM | POA: Diagnosis not present

## 2017-05-12 DIAGNOSIS — D1801 Hemangioma of skin and subcutaneous tissue: Secondary | ICD-10-CM | POA: Diagnosis not present

## 2017-05-12 DIAGNOSIS — D485 Neoplasm of uncertain behavior of skin: Secondary | ICD-10-CM | POA: Diagnosis not present

## 2017-05-12 DIAGNOSIS — L821 Other seborrheic keratosis: Secondary | ICD-10-CM | POA: Diagnosis not present

## 2017-05-12 DIAGNOSIS — D225 Melanocytic nevi of trunk: Secondary | ICD-10-CM | POA: Diagnosis not present

## 2017-05-12 DIAGNOSIS — Z85828 Personal history of other malignant neoplasm of skin: Secondary | ICD-10-CM | POA: Diagnosis not present

## 2017-05-12 DIAGNOSIS — Z85820 Personal history of malignant melanoma of skin: Secondary | ICD-10-CM | POA: Diagnosis not present

## 2017-05-12 DIAGNOSIS — D235 Other benign neoplasm of skin of trunk: Secondary | ICD-10-CM | POA: Diagnosis not present

## 2017-05-13 ENCOUNTER — Ambulatory Visit: Payer: Medicare Other | Admitting: Genetics

## 2017-05-13 ENCOUNTER — Encounter: Payer: Self-pay | Admitting: Genetics

## 2017-05-13 ENCOUNTER — Encounter: Payer: Self-pay | Admitting: Hematology

## 2017-05-13 ENCOUNTER — Ambulatory Visit (HOSPITAL_BASED_OUTPATIENT_CLINIC_OR_DEPARTMENT_OTHER): Payer: Medicare Other | Admitting: Hematology

## 2017-05-13 ENCOUNTER — Other Ambulatory Visit: Payer: Medicare Other

## 2017-05-13 ENCOUNTER — Other Ambulatory Visit (HOSPITAL_BASED_OUTPATIENT_CLINIC_OR_DEPARTMENT_OTHER): Payer: Medicare Other

## 2017-05-13 VITALS — BP 174/75 | HR 67 | Temp 97.7°F | Resp 18 | Wt 198.0 lb

## 2017-05-13 DIAGNOSIS — I480 Paroxysmal atrial fibrillation: Secondary | ICD-10-CM

## 2017-05-13 DIAGNOSIS — I10 Essential (primary) hypertension: Secondary | ICD-10-CM

## 2017-05-13 DIAGNOSIS — C20 Malignant neoplasm of rectum: Secondary | ICD-10-CM

## 2017-05-13 DIAGNOSIS — M1990 Unspecified osteoarthritis, unspecified site: Secondary | ICD-10-CM

## 2017-05-13 DIAGNOSIS — I4891 Unspecified atrial fibrillation: Secondary | ICD-10-CM | POA: Diagnosis not present

## 2017-05-13 DIAGNOSIS — Z808 Family history of malignant neoplasm of other organs or systems: Secondary | ICD-10-CM | POA: Insufficient documentation

## 2017-05-13 DIAGNOSIS — C4491 Basal cell carcinoma of skin, unspecified: Secondary | ICD-10-CM | POA: Insufficient documentation

## 2017-05-13 DIAGNOSIS — Z85820 Personal history of malignant melanoma of skin: Secondary | ICD-10-CM | POA: Diagnosis not present

## 2017-05-13 DIAGNOSIS — C439 Malignant melanoma of skin, unspecified: Secondary | ICD-10-CM | POA: Insufficient documentation

## 2017-05-13 DIAGNOSIS — C4330 Malignant melanoma of unspecified part of face: Secondary | ICD-10-CM

## 2017-05-13 LAB — CBC WITH DIFFERENTIAL/PLATELET
BASO%: 0.5 % (ref 0.0–2.0)
BASOS ABS: 0 10*3/uL (ref 0.0–0.1)
EOS ABS: 0.1 10*3/uL (ref 0.0–0.5)
EOS%: 2.5 % (ref 0.0–7.0)
HEMATOCRIT: 41.5 % (ref 38.4–49.9)
HGB: 13.7 g/dL (ref 13.0–17.1)
LYMPH#: 0.7 10*3/uL — AB (ref 0.9–3.3)
LYMPH%: 14.8 % (ref 14.0–49.0)
MCH: 32.2 pg (ref 27.2–33.4)
MCHC: 33 g/dL (ref 32.0–36.0)
MCV: 97.6 fL (ref 79.3–98.0)
MONO#: 0.7 10*3/uL (ref 0.1–0.9)
MONO%: 15.7 % — ABNORMAL HIGH (ref 0.0–14.0)
NEUT#: 2.9 10*3/uL (ref 1.5–6.5)
NEUT%: 66.5 % (ref 39.0–75.0)
Platelets: 186 10*3/uL (ref 140–400)
RBC: 4.25 10*6/uL (ref 4.20–5.82)
RDW: 13.7 % (ref 11.0–14.6)
WBC: 4.4 10*3/uL (ref 4.0–10.3)

## 2017-05-13 LAB — COMPREHENSIVE METABOLIC PANEL
ALBUMIN: 3.7 g/dL (ref 3.5–5.0)
ALK PHOS: 99 U/L (ref 40–150)
ALT: 16 U/L (ref 0–55)
ANION GAP: 9 meq/L (ref 3–11)
AST: 26 U/L (ref 5–34)
BILIRUBIN TOTAL: 0.53 mg/dL (ref 0.20–1.20)
BUN: 13 mg/dL (ref 7.0–26.0)
CO2: 27 mEq/L (ref 22–29)
Calcium: 9.8 mg/dL (ref 8.4–10.4)
Chloride: 105 mEq/L (ref 98–109)
Creatinine: 1 mg/dL (ref 0.7–1.3)
EGFR: 66 mL/min/{1.73_m2} — AB (ref >90)
GLUCOSE: 94 mg/dL (ref 70–140)
POTASSIUM: 4.4 meq/L (ref 3.5–5.1)
SODIUM: 140 meq/L (ref 136–145)
Total Protein: 7 g/dL (ref 6.4–8.3)

## 2017-05-13 LAB — CEA (IN HOUSE-CHCC): CEA (CHCC-In House): 4.6 ng/mL (ref 0.00–5.00)

## 2017-05-13 NOTE — Progress Notes (Addendum)
REFERRING PROVIDER: Hayden Pedro, PA-C [place], [place] [address]  PRIMARY PROVIDER:  [name], MD  PRIMARY REASON FOR VISIT:  1. Rectal cancer ([place])   2. Malignant melanoma of face ([place])   3. Family history of malignant melanoma     HISTORY OF PRESENT ILLNESS:   Nathaniel [name], a 83 y.o. male, was seen for a [place] cancer genetics consultation at the request of Dr. Dara Lords due to a personal and family history of cancer.  Nathaniel [name] presents to clinic today to discuss the possibility of a hereditary predisposition to cancer, genetic testing, and to further clarify his future cancer risks, as well as potential cancer risks for family members.   In 2016, at the age of 10, Nathaniel [name] was diagnosed with invasive adenocarcinoma of the rectum.  He received chemotherapy as well as radiation, but declined surgical treatment. He also reports that he has had 11 melanoma removed, and 40+ Basal cell carcinoma and squamous cell carcinomas removed over his lifetime.  He started having skin cancers in his 50's/60's and has had several removed almost every year since starting these screenings.    CANCER HISTORY:  Oncology History    Rectal cancer [facility])   Staging form: Colon and Rectum, AJCC 7th Edition     Clinical stage from 08/08/2015: Stage IIIB (T3, N1, M0) - Unsigned       Rectal cancer ([place])   07/16/2015 Pathology Results    Biopsy positive for invasive adenocarcinoma with signet ring cell features      07/23/2015 Initial Diagnosis    Rectal cancer ([place])      08/12/2015 Concurrent Chemotherapy    Xeloda '1500mg'$  q12h on the day of radiation       08/12/2015 - 09/20/2015 Radiation Therapy    radiaiton to rectal cancer       02/18/2016 Procedure    FLEX SIGMOIDOSCOPY: Endoscopic disappearance of previous rectal mass; atrophic mucosa in distal rectum  per Dr. Cristina Gong      02/20/2016 Pathology Results    No adenomatous change or malignancy      05/11/2017  Imaging    CT CAP IMPRESSION: 1. Newly enlarged 1.3 cm in short axis aortocaval node, while not entirely specific, is concerning for recurrent malignancy. No other indicators of recurrence identified. Biopsy or nuclear medicine PET-CT could be utilized to provide greater specificity if clinically warranted. 2. Continued abnormal wall thickening in the inferior rectum. Surrounding perirectal and presacral stranding likely from prior radiation therapy. 3. Prostatomegaly, prostate volume 93 cubic cm. 4. Other imaging findings of potential clinical significance: Aortic Atherosclerosis (ICD10-I70.0). Coronary atherosclerosis with mild cardiomegaly. Markedly severe chronic arthropathy in the left hip. Suspected cyst in segment 6 of the liver, unchanged. Prominent stool throughout the colon favors constipation. Direct right inguinal hernia contains adipose tissue. Possible retraction of the left testicle. 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        Other HEALTH INFO  Colonoscopy: yes; has been having c-scopes routinely for many years.  Reports a few polyps have been found, nothing too concerning reported until his rectal cancer. . Reports that he spend a lot of time at the beach in his lifetime. He said he often tried to 'cover up' hat/shirt, etc. But did not always use sunscreen.    Past Medical History:  Diagnosis Date  . Anemia due to antineoplastic chemotherapy   . Atherosclerosis of coronary artery cardiologist-  dr Aundra Dubin   aorta and  coronary arteries moderate per ct 02-07-2016  . Basal cell carcinoma    40+  . Bladder calculi   . BPH (benign prostatic hyperplasia)   . Calculi, urethra   . Chronic low back pain   . Constipation   . ED (erectile dysfunction)   . Edema of lower extremity   . Family history of melanoma   . Foley catheter in place   . History of adenomatous polyp of colon    2007  . History of basal cell carcinoma excision    multiple  .  History of cellulitis    left lower leg-- resolved  . History of melanoma excision    temple right side/ multiple  . History of obstructive sleep apnea    per pt cured by hypnosis  . History of pericarditis    acute episode 09/ 2014  per cardiologist note , dr Shirlee Latch , related possibly to xarelto use  . History of ulcerative colitis    remote  . HTN (hypertension)   . Hypercholesteremia   . Hypothyroidism   . Incomplete right bundle branch block   . LAFB (left anterior fascicular block)   . Melanoma ([facility])   . OA (osteoarthritis)   . PAF (paroxysmal atrial fibrillation) [facility]) cardiologist-  dr dalton Shirlee Latch   dx 09/ 2014  in setting of acute pericarditis   . Rectal cancer [facility]) dx 07-16-2015  oncologist-  dr Malachy Mood   Stage IIIB (cT3, N1, M0)-- concurrent  chemotherapy started 08-12-2015/  radiation complete (08-12-2015 to 09-20-2015)  . S/P radiation therapy 08/12/15-09/20/15   rectal ca50.4Gy total dose  . Spinal stenosis     Past Surgical History:  Procedure Laterality Date  . CATARACT EXTRACTION W/ INTRAOCULAR LENS IMPLANT Bilateral 10/ 2016  . CYSTOSCOPY WITH LITHOLAPAXY N/A 02/12/2016   Procedure: CYSTOSCOPY WITH LITHOLAPAXY;  Surgeon: Sebastian Ache, MD;  Location: [facility];  Service: Urology;  Laterality: N/A;  . EUS N/A 08/08/2015   Procedure: LOWER ENDOSCOPIC ULTRASOUND (EUS);  Surgeon: Willis Modena, MD;  Location[facility] ENDOSCOPY;  Service: Endoscopy;  Laterality: N/A;  . FLEXIBLE SIGMOIDOSCOPY N/A 08/08/2015   Procedure: FLEXIBLE SIGMOIDOSCOPY;  Surgeon: Willis Modena, MD;  Location: [facility]COPY;  Service: Endoscopy;  Laterality: N/A;  poor prep  . HOLMIUM LASER APPLICATION N/A 02/12/2016   Procedure: HOLMIUM LASER APPLICATION;  Surgeon: Sebastian Ache, MD;  Location[facility];  Service: Urology;  Laterality: N/A;  . INGUINAL HERNIA REPAIR Left 1965  . LEFT KNEE SURGERY  1940  . MOHS SURGERY  x9  last one 2014   . ORIF LEFT ANKLE FX   1935  . ROTATOR CUFF REPAIR Bilateral right 2013/  left 2010  . TRANSTHORACIC ECHOCARDIOGRAM  06-06-2013   ef 55-60%/  mild AR/  mild dilated aortic root/  mild LAE and RAE/  mild to moderate TR/  small pericardial effusion identified    Social History   Social History  . Marital status: Married    Spouse name: N/A  . Number of children: N/A  . Years of education: N/A   Social History Main Topics  . Smoking status: Never Smoker  . Smokeless tobacco: Never Used  . Alcohol use 0.6 oz/week    1 Glasses of wine per week     Comment: 1 glass of wine per week  . Drug use: No  . Sexual activity: Not Asked   Other Topics Concern  . 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  Significant diagnoses are listed below: Family History  Problem Relation Age of Onset  . Stroke Father   . Heart disease Father   . Parkinson's disease Mother   . Heart disease Brother   . Melanoma Brother 48       metastatic and secondary cancer  . Heart disease Maternal Uncle    Ms. Retter has 4 daughters and 2 sons listed below: -1 daughter is 20 with no history of cancer.  She has 2 children with no history of cancer.  -1 daughter is 35 with no history of cancer.  She has no children.  -1 daughter is 39 with no history of cancer.  She has no children.  -1 daughter is 46 with 3 children.  No history of cancer.  -1 son is 75 with no history of cancer.  He has 2 daughters with no history of cancer.  -1 son is 37 and has AIDS, but no history of cancer. He has no children. Ms. Magid reported that he  does not know if his children are getting skin exams regularly and while he has not heard of them having skin cancer, he does not know that they would tell him about a skin cancer that was removed and required no further treatment.  Mr. Brigance has 2 brothers.  One brother is 3 and was recently diagnosed with melanoma. This melanoma is metastatic, and he has 6 tumors located around his body (1 in brain) that he believes spread from this melanoma.  He is unsure if any of the other 6 cancerous tumors were primaries or metastasizes from the melanoma.  He responded exceptionally well to some type of auto-transplant therapy. This brother has a daughter and 5 sons with no history of cancer.  Mr. Solorio other brother died at 1 from heart disease.  He had 5 children, none with any known history of cancer, although information about these neices/neqhews is limited.   Mr. Marple father died at 68 from a stroke.  Ms. Barnaby has 3 paternal uncles and 2 paternal aunts, none with any known history of cancer.  However, Mr. Stieg reports that he does not have close contact with these relatives and does not know if he would be aware if there was a cancer diagnosis. Mr. Discher paternal cousins have no known history of cancer, although some did die at younger ages.  He has limited information about these cousins. Mr. Zeiss is not aware of any cancer in his paternal grandparents, but states that medical access/knowledge was limited and is unsure if they would have known about a cancer diagnosis.   Mr. Schwartz mother died at 31 from parkinson's disease.  Mr. Gater had 1 maternal uncle who died from heart disease and had no children.  Mr. Kandel also had 1 maternal aunt who died at 13 and had no history of cancer.  She had children, none with any known history of cancer. Mr. Takahashi is not aware of any cancer in his maternal grandparents, but states that medical access/knowledge was limited and is  unsure if they would  have known about a cancer diagnosis.   Mr. Deviney is unaware of previous family history of genetic testing for hereditary cancer risks. Patient's maternal ancestors are of English/Irish descent, and paternal ancestors are of English/French/Dutch descent. There is no reported Ashkenazi Jewish ancestry. There is no known consanguinity.  GENETIC COUNSELING ASSESSMENT: ZIDANE RENNER is a 83 y.o. male with a personal and family history which is somewhat suggestive of a Hereditary Cancer Predisposition Syndrome. We, therefore, discussed and recommended the following at today's visit.   DISCUSSION: We reviewed the characteristics, features and inheritance patterns of hereditary cancer syndromes. We also discussed genetic testing, including the appropriate family members to test, the process of testing, insurance coverage and turn-around-time for results. We discussed the implications of a negative, positive and/or variant of uncertain significant result. We recommended Nathaniel [name] pursue genetic testing for the Multi- Cancer gene panel. The Multi-Cancer Panel + Melanoma Panel offered by [facility] includes sequencing and/or deletion duplication testing of the following 84 genes: ALK, APC, ATM, AXIN2,BAP1,  BARD1, BLM, BMPR1A, BRCA1, BRCA2, BRIP1, CASR, CDC73, CDH1, CDK4, CDKN1B, CDKN1C, CDKN2A (p14ARF), CDKN2A (p16INK4a), CEBPA, CHEK2, CTNNA1, DICER1, DIS3L2, EGFR (c.2369C>T, p.Thr790Met variant only), EPCAM (Deletion/duplication testing only), FH, FLCN, GATA2, GPC3, GREM1 (Promoter region deletion/duplication testing only), HOXB13 (c.251G>A, p.Gly84Glu), HRAS, KIT, MAX, MEN1, MET, MITF (c.952G>A, p.Glu318Lys variant only), MLH1, MSH2, MSH3, MSH6, MUTYH, MC1R, NBN, NF1, NF2, NTHL1, PALB2, PDGFRA, PHOX2B, PMS2, POLD1, POLE, POT1, PRKAR1A, PTCH1, PTEN, RAD50, RAD51C, RAD51D, RB1, RECQL4, RET, RUNX1, SDHAF2, SDHA (sequence changes only), SDHB, SDHC, SDHD, SMAD4, SMARCA4, SMARCB1, SMARCE1,  STK11, SUFU, TERC, TERT, TMEM127, TP53, TSC1, TSC2, VHL, WRN and WT1.   We discussed that only 5-10% of cancers are associated with a Hereditary Cancer Predisposition Syndrome.    The most common hereditary cancer syndrome associated with colon/rectal cancer is Lynch Syndrome.  Lynch Syndrome is caused by mutations in the genes: MLH1, MSH2, MSH6, PMS2 and EPCAM.  This syndrome increases the risk for colon, uterine, ovarian and stomach cancers, as well as others.  Families with Lynch Syndrome tend to have multiple family members with these cancers, typically diagnosed under age 56, and diagnoses in multiple generations.  His family history does not fit the typical clinical presentation of Lynch Syndrome, but there is variability so it is possible.    We discussed that he has had an abnormally high amount of melanoma skin cancer and other types of skin cancer (BCC/SCC).  This combined with his brother's diagnosis of metastatic melanoma makes [place] suspicious there may be a genetic cause for the skin cancer in Nathaniel [name] and his family.    Familial atypical multiple mole melanoma (FAMMM) syndrome is caused by mutations in the gene CDKN2A and causes individuals to have an increased risk of developing melanoma, pancreatic cancer, and potentially other types of cancer (squamous cell carcinoma, lung cancer, etc.)  Individuals with this condition are recommended to start skin examination (baseline) at the age of 60 and potentially have increased screening intervals (individualized).  There are currently no pancreatic screening recommendations, however pancreatic screening protocols could be considered.    There are also other cancer risk genes associated with melanoma as well as with other types of skin cancer (BCC, SCC, etc).  Examples include PTCH1 (Gorlin Syndrome), MITF, BRCA2, CDK4, etc. There are also many other genes that are associated with many other types of cancer risk.    We discussed that if he is  found to have a mutation in one of these genes, it  may impact future medical management recommendations such as increased cancer screenings and consideration of risk reducing surgeries.  A positive result could also have implications for the patient's family members.  A Negative result would mean we were unable to identify a hereditary component to her cancer, but does not rule out the possibility of a hereditary basis for her cancer.  There could be mutations that are undetectable by current technology, or in genes not yet tested or identified to increase cancer risk.    We discussed the potential to find a Variant of Uncertain Significance or VUS.  These are variants that have not yet been identified as pathogenic or benign, and it is unknown if this variant is associated with increased cancer risk or if this is a normal finding.  Most VUS's are reclassified to benign or likely benign.   It should not be used to make medical management decisions. With time, we suspect the lab will determine the significance of any VUS's identified if any.   Based on Nathaniel [name]'s personal and family history of cancer, he meets medical criteria for genetic testing outlined by the [facility] Practice Guidelines.  Despite that he meets criteria, he may still have an out of pocket cost. We discussed that if his out of pocket cost for testing is over $100, the laboratory will call and confirm whether he wants to proceed with testing.  If the out of pocket cost of testing is less than $100 he will be billed by the genetic testing laboratory.   Nathaniel [name] also indicated interest in any research studies, etc. That he and his brother may be a good fit for.  I told him that genetic testing would be a good starting point, and based on those results there may be research he could get involved in.   PLAN: After considering the risks, benefits, and limitations, Nathaniel [name]  provided informed consent to pursue genetic testing and the  blood sample was sent to [facility] for analysis of the Multi-Cancer Panel. Results should be available within approximately 2-3 weeks' time, at which point they will be disclosed by telephone to Nathaniel [name], as will any additional recommendations warranted by these results. Nathaniel [name] will receive a summary of his genetic counseling visit and a copy of his results once available. This information will also be available in Epic. We encouraged Nathaniel [name] to remain in contact with cancer genetics annually so that we can continuously update the family history and inform him of any changes in cancer genetics and testing that may be of benefit for his family. Nathaniel [name] questions were answered to his satisfaction today. Our contact information was provided should additional questions or concerns arise.  Lastly, we encouraged Nathaniel [name] to remain in contact with cancer genetics annually so that we can continuously update the family history and inform him of any changes in cancer genetics and testing that may be of benefit for this family.   Mr.  [name] questions were answered to his satisfaction today. Our contact information was provided should additional questions or concerns arise. Thank you for the referral and allowing [place] to share in the care of your patient.   Tana Felts, MS Genetic Counselor [email or web address] phone: [phone]  The patient was seen for a total of 60 minutes in face-to-face genetic counseling.

## 2017-05-15 ENCOUNTER — Encounter: Payer: Self-pay | Admitting: Hematology

## 2017-06-01 ENCOUNTER — Ambulatory Visit: Payer: Self-pay | Admitting: Genetics

## 2017-06-01 ENCOUNTER — Encounter: Payer: Self-pay | Admitting: Genetics

## 2017-06-01 DIAGNOSIS — Z1379 Encounter for other screening for genetic and chromosomal anomalies: Secondary | ICD-10-CM | POA: Insufficient documentation

## 2017-06-01 DIAGNOSIS — Z808 Family history of malignant neoplasm of other organs or systems: Secondary | ICD-10-CM

## 2017-06-01 DIAGNOSIS — C20 Malignant neoplasm of rectum: Secondary | ICD-10-CM

## 2017-06-01 DIAGNOSIS — C4330 Malignant melanoma of unspecified part of face: Secondary | ICD-10-CM

## 2017-06-01 NOTE — Progress Notes (Signed)
HPI: Mr. Schools was previously seen in the [place] clinic on 05/13/2017 due to a personal and and family history of melanoma and other cancers and concerns regarding a hereditary predisposition to cancer. Please refer to our prior cancer genetics clinic note for more information regarding Mr. Krull's medical, social and family histories, and our assessment and recommendations, at the time. Mr. Seto recent genetic test results were disclosed to him, as well as recommendations warranted by these results. These results and recommendations are discussed in more detail below.  CANCER HISTORY:  Oncology History    Rectal cancer [facility])   Staging form: Colon and Rectum, AJCC 7th Edition     Clinical stage from 08/08/2015: Stage IIIB (T3, N1, M0) - Unsigned       Rectal cancer ([place])   07/16/2015 Pathology Results    Biopsy positive for invasive adenocarcinoma with signet ring cell features      07/23/2015 Initial Diagnosis    Rectal cancer ([place] [place] [facility])      08/12/2015 Concurrent Chemotherapy    Xeloda '1500mg'$  q12h on the day of radiation       08/12/2015 - 09/20/2015 Radiation Therapy    radiaiton to rectal cancer       02/18/2016 Procedure    FLEX SIGMOIDOSCOPY: Endoscopic disappearance of previous rectal mass; atrophic mucosa in distal rectum  per Dr. Cristina Gong      02/20/2016 Pathology Results    No adenomatous change or malignancy      05/11/2017 Imaging    CT CAP IMPRESSION: 1. Newly enlarged 1.3 cm in short axis aortocaval node, while not entirely specific, is concerning for recurrent malignancy. No other indicators of recurrence identified. Biopsy or nuclear medicine PET-CT could be utilized to provide greater specificity if clinically warranted. 2. Continued abnormal wall thickening in the inferior rectum. Surrounding perirectal and presacral stranding likely from prior radiation therapy. 3. Prostatomegaly, prostate volume 93 cubic cm. 4. Other imaging findings of  potential clinical significance: Aortic Atherosclerosis (ICD10-I70.0). Coronary atherosclerosis with mild cardiomegaly. Markedly severe chronic arthropathy in the left hip. Suspected cyst in segment 6 of the liver, unchanged. Prominent stool throughout the colon favors constipation. Direct right inguinal hernia contains adipose tissue. Possible retraction of the left testicle. 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        FAMILY HISTORY:  We obtained a detailed, 4-generation family history.  Significant diagnoses are listed below: Family History  Problem Relation Age of Onset  . Stroke Father   . Heart disease Father   . Parkinson's disease Mother   . Heart disease Brother   . Melanoma Brother 19       metastatic and secondary cancer  . Heart disease Maternal Uncle    Mr. Sibal has 4 daughters and 2 sons listed below: -1 daughter is 48 and had a deep tissue/muscle cancer in there thigh (myosarcoma? I am guessing potentially) in her late 55's.  She has 2 children with no history of cancer.  -1 daughter is 73 with no history of cancer.  She has no children.  -1 daughter is 70 with no history of cancer.  She has no children.  -1 daughter is 11 with 3 children. She has a history of both tyroid cancer as well as a parotid gland tumor.  -1 son is 61 with no history of cancer.  He has 2 daughters with no history of cancer.  -1 son is 63 and has AIDS, but no history of cancer.  He has no children.  Mr. Goar reported that he does not know if his children are getting skin exams regularly and while he has not heard of them having skin cancer, he does not know that they would tell him about a skin cancer that was removed and required no further treatment. Toppin updated me when we spoke on the phone that some of his grandchildren have had a few BCC skin cancers.   Mr. Cihlar has 2 brothers.  One brother is 71 and was recently diagnosed with melanoma. This melanoma  is metastatic, and he has 6 tumors located around his body (1 in brain) that he believes spread from this melanoma.  He is unsure if any of the other 6 cancerous tumors were primaries or metastasizes from the melanoma.  He responded exceptionally well to some type of auto-transplant therapy. This brother has a daughter and 5 sons with no history of cancer.  Mr. Ferns other brother died at 60 from heart disease.  He had 5 children, none with any known history of cancer, although information about these neices/neqhews is limited.   Mr. Usrey father died at 84 from a stroke.  Ms. Kishi has 3 paternal uncles and 2 paternal aunts, none with any known history of cancer.  However, Mr. Huss reports that he does not have close contact with these relatives and does not know if he would be aware if there was a cancer diagnosis. Mr. Rieth paternal cousins have no known history of cancer, although some did die at younger ages.  He has limited information about these cousins. Mr. Ponds is not aware of any cancer in his paternal grandparents, but states that medical access/knowledge was limited and is unsure if they would have known about a cancer diagnosis.   Mr. Seliga mother died at 58 from parkinson's disease.  Mr. Eckhardt had 1 maternal uncle who died from heart disease and had no children.  Mr. Deshmukh also had 1 maternal aunt who died at 73 and had no history of cancer.  She had children, none with any known history of cancer. Mr. Vandyke is not aware of any cancer in his maternal grandparents, but states that medical access/knowledge was limited and is unsure if they would have known about a cancer diagnosis.   Mr. Wenner is unaware of previous family history of genetic testing for hereditary cancer risks. Patient's maternal ancestors are of English/Irish descent, and paternal ancestors are of English/French/Dutch descent. There is no reported Ashkenazi Jewish ancestry. There is  no known consanguinity.  GENETIC TEST RESULTS: Genetic testing performed through [facility]'s Multi-Cancer Panel + Melanoma panel reported out on 05/20/2017 showed no pathogenic mutations.  The Multi-Cancer Panel + Melanoma Panel offered by [facility] includes sequencing and/or deletion duplication testing of the following 84 genes: ALK, APC, ATM, AXIN2,BAP1,  BARD1, BLM, BMPR1A, BRCA1, BRCA2, BRIP1, CASR, CDC73, CDH1, CDK4, CDKN1B, CDKN1C, CDKN2A (p14ARF), CDKN2A (p16INK4a), CEBPA, CHEK2, CTNNA1, DICER1, DIS3L2, EGFR (c.2369C>T, p.Thr790Met variant only), EPCAM (Deletion/duplication testing only), FH, FLCN, GATA2, GPC3, GREM1 (Promoter region deletion/duplication testing only), HOXB13 (c.251G>A, p.Gly84Glu), HRAS, KIT, MAX, MEN1, MET, MC1R, MITF (c.952G>A, p.Glu318Lys variant only), MLH1, MSH2, MSH3, MSH6, MUTYH, NBN, NF1, NF2, NTHL1, PALB2, PDGFRA, PHOX2B, PMS2, POLD1, POLE, POT1, PRKAR1A, PTCH1, PTEN, RAD50, RAD51C, RAD51D, RB1, RECQL4, RET, RUNX1, SDHAF2, SDHA (sequence changes only), SDHB, SDHC, SDHD, SMAD4, SMARCA4, SMARCB1, SMARCE1, STK11, SUFU, TERC, TERT, TMEM127, TP53, TSC1, TSC2, VHL, WRN and WT1.   The test report will be scanned into EPIC and will be  located under the Molecular Pathology section of the Results Review tab.A portion of the result report is included below for reference.      We discussed with Mr. Murch that because current genetic testing is not perfect, it is possible there may be a gene mutation in one of these genes that current testing cannot detect, but that chance is small. We also discussed, that there could be another gene that has not yet been discovered, or that we have not yet tested, that is responsible for the cancer diagnoses in the family. Therefore, it is important to remain in touch with cancer genetics in the future so that we can continue to offer Mr. Goodnow the most up to date genetic testing.   ADDITIONAL GENETIC TESTING: We discussed with Mr. Hack that  his genetic testing was fairly extensive.  If there are are genes identified to increase cancer risk that can be analyzed in the future, we would be happy to discuss and coordinate this testing at that time.    CANCER SCREENING RECOMMENDATIONS:  Based on these negative genetic test results, there areno additional cancer risks we are aware of for Mr. Colasanti, and no additional screening or medical management that we would recommend for him at this time based on genetic testing.    This result  indicates that it is unlikely Mr. Hartsell has an increased risk for a future cancer due to a mutation in one of these genes.  We still do not know why he has developed so many skin cancers in his lifetime.  It could that there is no genetic basis to his multiple skin cancers, or there may be a hereditary cause that current knowledge and technology can detect.  Therefore, it is recommended he continue to follow the cancer management and screening guidelines provided by his oncology and primary healthcare provider. Other factors such as her personal and family history may still affect his cancer risk.  RECOMMENDATIONS FOR FAMILY MEMBERS: Individuals in this family might be at some increased risk of developing cancer, over the general population risk, simply due to the family history of cancer. We recommended women in this family have a yearly mammogram beginning at age 29, or 44 years younger than the earliest onset of cancer, an annual clinical breast exam, and perform monthly breast self-exams. Women in this family should also have a gynecological exam as recommended by their primary provider. All family members should have a colonoscopy by age 2.  We recommend that all of his siblings, children, grandchildren, nieces, nephews, etc have routine skin exams by a dermatologist.    FOLLOW-UP: Lastly, we discussed with Mr. Agan that cancer genetics is a rapidly advancing field and it is possible that new genetic  tests will be appropriate for him and/or his family members in the future. We encouraged him to remain in contact with cancer genetics on an annual basis so we can update his personal and family histories and let him know of advances in cancer genetics that may benefit this family.   Mr. Anschutz was interested in research opportunities.  I told him that I could send along information for hereditary cancer registries with his genetic test results in the mail.  Our contact number was provided. Mr. Ulysse questions were answered to his satisfaction, and he knows he is welcome to call us at anytime with additional questions or concerns.   Ferol Luz, MS Genetic Counselor [email or web address]

## 2017-07-01 DIAGNOSIS — Z23 Encounter for immunization: Secondary | ICD-10-CM | POA: Diagnosis not present

## 2017-07-09 ENCOUNTER — Other Ambulatory Visit: Payer: Self-pay | Admitting: Cardiology

## 2017-07-20 ENCOUNTER — Encounter: Payer: Self-pay | Admitting: Nurse Practitioner

## 2017-07-20 ENCOUNTER — Ambulatory Visit (INDEPENDENT_AMBULATORY_CARE_PROVIDER_SITE_OTHER): Payer: Medicare Other | Admitting: Nurse Practitioner

## 2017-07-20 VITALS — BP 158/72 | HR 66 | Ht 72.0 in | Wt 198.4 lb

## 2017-07-20 DIAGNOSIS — I10 Essential (primary) hypertension: Secondary | ICD-10-CM | POA: Diagnosis not present

## 2017-07-20 NOTE — Patient Instructions (Addendum)
We will be checking the following labs today - NONE   Medication Instructions:    Continue with your current medicines.     Testing/Procedures To Be Arranged:  N/A  Follow-Up:   See me in 6 months    Other Special Instructions:   Call Dr. Ernestina Penna office to get your CT scheduled.     If you need a refill on your cardiac medications before your next appointment, please call your pharmacy.   Call the [place] office at [phone] if you have any questions, problems or concerns.

## 2017-07-20 NOTE — Progress Notes (Signed)
CARDIOLOGY OFFICE NOTE  Date:  07/20/2017    [name] Date of Birth: 1919-12-23 Medical Record #[id number]  PCP:  [name], MD  Cardiologist:  Annabell Howells    Chief Complaint  Patient presents with  . Atrial Fibrillation    Follow up visit - seen for Dr. Aundra Dubin    History of Present Illness: GAYLON BENTZ is a 83 y.o. male who presents today for a 6 month check. Seen for Dr. Aundra Dubin.   He has had acute pericarditis and atrial fibrillation. He was admitted in early 9/14 with pleuritic chest pain. He had diffuse slight ST elevation on ECG and was thought to have acute pericarditis, ?viral pericarditis. No pericardial effusion initially. While in the hospital, he went into atrial fibrillation with RVR and was treated with amiodarone. He converted to NSR and has remained in NSR.  He was started on Xarelto in the hospital. Followup echo later in 9/14 showed the development of a moderate pericardial effusion without tamponade. Xarelto was stopped. Patient had another echo on 06/06/13 that showed a small pericardial effusion (improved).   He was then diagnosed with rectal cancer and underwent chemotherapy and radiation, opted against surgery.   Saw Dr. Aundra Dubin back in November of 2017 and was felt to be doing ok from our standpoint. I last saw him back in May - was doing ok. Said he had been cured from his cancer standpoint.   Comes in today. Here alone. He is doing well. Fishing. No chest pain. Breathing is ok. No palpitations noted. He has been noted to have abnormal Ct scan back in September - for recheck this month - needs to be scheduled. He feels like he is doing well. Still with some back pain. Chronic swelling but stable.   PMH: 1. Hyperlipidemia 2. Hypothyroidism 3. HTN 4. Acute pericarditis: 9/14. Echo (05/11/13) with EF 55-60%, normal RV, no pericardial effusion. Echo (05/22/13) with moderate pericardial effusion, no tamponade. Echo  (06/06/13) with small pericardial effusion, no tamponade.  5. Paroxysmal atrial fibrillation: In setting of acute pericarditis when initially noted. Was started on Xarelto but this was stopped when he developed a pericardial effusion.  6. Chronic low back pain 7. Venous insufficiency.  8. Rectal cancer: s/p chemotherapy and radiation. Decided against surgery.  9. Nephrolithiasis.    Past Medical History:  Diagnosis Date  . Anemia due to antineoplastic chemotherapy   . Atherosclerosis of coronary artery cardiologist-  dr Aundra Dubin   aorta and coronary arteries moderate per ct 02-07-2016  . Basal cell carcinoma    40+  . Bladder calculi   . BPH (benign prostatic hyperplasia)   . Calculi, urethra   . Chronic low back pain   . Constipation   . ED (erectile dysfunction)   . Edema of lower extremity   . Family history of melanoma   . Foley catheter in place   . History of adenomatous polyp of colon    2007  . History of basal cell carcinoma excision    multiple  . History of cellulitis    left lower leg-- resolved  . History of melanoma excision    temple right side/ multiple  . History of obstructive sleep apnea    per pt cured by hypnosis  . History of pericarditis    acute episode 09/ 2014  per cardiologist note , dr Aundra Dubin , related possibly to xarelto use  . History of ulcerative colitis    remote  .  HTN (hypertension)   . Hypercholesteremia   . Hypothyroidism   . Incomplete right bundle branch block   . LAFB (left anterior fascicular block)   . Melanoma ([place])   . OA (osteoarthritis)   . PAF (paroxysmal atrial fibrillation) [facility]) cardiologist-  dr dalton Aundra Dubin   dx 09/ 2014  in setting of acute pericarditis   . Rectal cancer [facility]) dx 07-16-2015  oncologist-  dr [name]   Stage IIIB (cT3, N1, M0)-- concurrent  chemotherapy started 08-12-2015/  radiation complete (08-12-2015 to 09-20-2015)  . S/P radiation therapy 08/12/15-09/20/15   rectal ca50.4Gy total dose  . Spinal  stenosis     Past Surgical History:  Procedure Laterality Date  . CATARACT EXTRACTION W/ INTRAOCULAR LENS IMPLANT Bilateral 10/ 2016  . INGUINAL HERNIA REPAIR Left 1965  . LEFT KNEE SURGERY  1940  . MOHS SURGERY  x9  last one 2014   . ORIF LEFT ANKLE FX  1935  . ROTATOR CUFF REPAIR Bilateral right 2013/  left 2010  . TRANSTHORACIC ECHOCARDIOGRAM  06-06-2013   ef 55-60%/  mild AR/  mild dilated aortic root/  mild LAE and RAE/  mild to moderate TR/  small pericardial effusion identified     Medications: Current Meds  Medication Sig  . aspirin EC 325 MG tablet Take 325 mg by mouth daily.   . finasteride (PROSCAR) 5 MG tablet Take 5 mg by mouth every evening. Dose is taken at bedtime  . furosemide (LASIX) 20 MG tablet TAKE 1 TABLET BY MOUTH DAILY  . KLOR-CON 10 10 MEQ tablet TAKE 1 TABLET BY MOUTH DAILY  . levothyroxine (SYNTHROID, LEVOTHROID) 100 MCG tablet TAKE 1 TABLET ON AN EMPTY STOMACH IN THE MORNING ONCE A DAY  . metoprolol succinate (TOPROL-XL) 25 MG 24 hr tablet TAKE 1/2 TABLET BY MOUTH DAILY.  . Multiple Vitamin (MULTIVITAMIN WITH MINERALS) TABS tablet Take 1 tablet by mouth daily.     Allergies: Allergies  Allergen Reactions  . Cinnamon Itching  . Ibuprofen Diarrhea  . Percocet [Oxycodone-Acetaminophen] Other (See Comments)    confusion  . Protonix [Pantoprazole Sodium] Diarrhea and Nausea And Vomiting  . Xarelto [Rivaroxaban] Other (See Comments)    Diarrhea, dehydration    Social History: The patient  reports that  has never smoked. he has never used smokeless tobacco. He reports that he drinks about 0.6 oz of alcohol per week. He reports that he does not use drugs.   Family History: The patient's family history includes Cancer in his daughter; Cancer (age of onset: 61) in his daughter; Cancer - Cervical in his daughter; Heart disease in his brother, father, and maternal uncle; Melanoma (age of onset: 73) in his brother; Parkinson's disease in his mother; Stroke  in his father.   Review of Systems: Please see the history of present illness.   Otherwise, the review of systems is positive for none.   All other systems are reviewed and negative.   Physical Exam: VS:  BP (!) 158/72 (BP Location: Left Arm, Patient Position: Sitting, Cuff Size: Normal)   Pulse 66   Ht 6' (1.829 m)   Wt 198 lb 6.4 oz (90 kg)   BMI 26.91 kg/m  .  BMI Body mass index is 26.91 kg/m.  Wt Readings from Last 3 Encounters:  07/20/17 198 lb 6.4 oz (90 kg)  05/13/17 198 lb (89.8 kg)  04/13/17 201 lb 6.4 oz (91.4 kg)   BP 130/70  General: Pleasant. He looks younger than stated age.  Alert and in no acute distress.   HEENT: Normal.  Neck: Supple, no JVD, carotid bruits, or masses noted.  Cardiac: Regular rate and rhythm. Soft outflow murmur. Chronic edema.  Respiratory:  Lungs are clear to auscultation bilaterally with normal work of breathing.  GI: Soft and nontender.  MS: No deformity or atrophy. Gait and ROM intact. Using a walker.  Skin: Warm and dry. Color is normal.  Neuro:  Strength and sensation are intact and no gross focal deficits noted.  Psych: Alert, appropriate and with normal affect.   LABORATORY DATA:  EKG:  EKG is ordered today. This demonstrates NSR with 1st degree AV block, LVH.   Lab Results  Component Value Date   WBC 4.4 05/13/2017   HGB 13.7 05/13/2017   HCT 41.5 05/13/2017   PLT 186 05/13/2017   GLUCOSE 94 05/13/2017   ALT 16 05/13/2017   AST 26 05/13/2017   NA 140 05/13/2017   K 4.4 05/13/2017   CL 103 02/12/2016   CREATININE 1.0 05/13/2017   BUN 13.0 05/13/2017   CO2 27 05/13/2017   TSH 1.49 09/14/2013     BNP (last 3 results) No results for input(s): BNP in the last 8760 hours.  ProBNP (last 3 results) No results for input(s): PROBNP in the last 8760 hours.   Other Studies Reviewed Today:  Echo Study Conclusions from 08/2016  - Left ventricle: The cavity size was normal. There was moderate concentric hypertrophy.  Systolic function was normal. The estimated ejection fraction was in the range of 60% to 65%. Wall motion was normal; there were no regional wall motion abnormalities. Doppler parameters are consistent with abnormal left ventricular relaxation (grade 1 diastolic dysfunction). Doppler parameters are consistent with indeterminate ventricular filling pressure. - Aortic valve: Transvalvular velocity was within the normal range. There was no stenosis. There was mild regurgitation. - Aorta: Ascending aortic diameter: 35 mm (S). - Mitral valve: Transvalvular velocity was within the normal range. There was no evidence for stenosis. There was no regurgitation. - Right ventricle: The cavity size was mildly dilated. Wall thickness was normal. Systolic function was normal. - Right atrium: The atrium was mildly dilated. - Atrial septum: No defect or patent foramen ovale was identified by color flow Doppler. - Tricuspid valve: There was mild regurgitation. - Pulmonary arteries: PA peak pressure: 38 mm Hg (S).  Assessment/Plan:  1. History of pericarditis - dates back to 2014 - no recurrence.   2. PAF - one lone episode in the setting of acute pericarditis - no longer on Xarelto due to possibility that it was associated with pericardial effusion. CHADSVASC is at least a 3. He has been on high dose aspirin since this time due to the reaction from Xarelto - will continue for now. No bleeding issues. Last lab from September was stable.   3. Prior rectal bleeding - none at this time. He has been treated for rectal cancer. New abnormality noted on CT from September - he is calling Dr. Burr Medico to get repeat study scheduled.   4. Advanced age - he continues to do quite well.   5. HTN - Bp by me is 130/80. No changes made today.   6. Chronic edema - would keep on lasix and potassium for now. Labs from September were stable.   Current medicines are reviewed with the patient today.   The patient does not have concerns regarding medicines other than what has been noted above.  The following changes have been made:  See  above.  Labs/ tests ordered today include:    Orders Placed This Encounter  Procedures  . EKG 12-Lead     Disposition:   FU with me in 6 months.   Patient is agreeable to this plan and will call if any problems develop in the interim.   SignedTruitt Merle, NP  07/20/2017 10:06 AM  [facility] [address] [place], [place]  [address] Phone: [phone] Fax: [phone]

## 2017-07-22 ENCOUNTER — Telehealth: Payer: Self-pay | Admitting: Hematology

## 2017-07-22 NOTE — Telephone Encounter (Signed)
Left message for patient regarding upcoming November appointments. Mailed appointment reminder to patient.

## 2017-07-27 ENCOUNTER — Telehealth: Payer: Self-pay | Admitting: Hematology

## 2017-07-27 ENCOUNTER — Other Ambulatory Visit (HOSPITAL_BASED_OUTPATIENT_CLINIC_OR_DEPARTMENT_OTHER): Payer: Medicare Other

## 2017-07-27 DIAGNOSIS — C20 Malignant neoplasm of rectum: Secondary | ICD-10-CM

## 2017-07-27 LAB — COMPREHENSIVE METABOLIC PANEL
ALBUMIN: 3.7 g/dL (ref 3.5–5.0)
ALK PHOS: 97 U/L (ref 40–150)
ALT: 21 U/L (ref 0–55)
AST: 24 U/L (ref 5–34)
Anion Gap: 9 mEq/L (ref 3–11)
BILIRUBIN TOTAL: 0.56 mg/dL (ref 0.20–1.20)
BUN: 14.1 mg/dL (ref 7.0–26.0)
CALCIUM: 9.4 mg/dL (ref 8.4–10.4)
CO2: 28 mEq/L (ref 22–29)
CREATININE: 1 mg/dL (ref 0.7–1.3)
Chloride: 103 mEq/L (ref 98–109)
EGFR: >60 mL/min/{1.73_m2} (ref >60)
GLUCOSE: 144 mg/dL — AB (ref 70–140)
Potassium: 4.1 mEq/L (ref 3.5–5.1)
Sodium: 141 mEq/L (ref 136–145)
TOTAL PROTEIN: 6.8 g/dL (ref 6.4–8.3)

## 2017-07-27 LAB — CBC WITH DIFFERENTIAL/PLATELET
BASO%: 1.2 % (ref 0.0–2.0)
Basophils Absolute: 0.1 10*3/uL (ref 0.0–0.1)
EOS ABS: 0.1 10*3/uL (ref 0.0–0.5)
EOS%: 2.6 % (ref 0.0–7.0)
HEMATOCRIT: 42 % (ref 38.4–49.9)
HEMOGLOBIN: 14.1 g/dL (ref 13.0–17.1)
LYMPH#: 0.7 10*3/uL — AB (ref 0.9–3.3)
LYMPH%: 15.7 % (ref 14.0–49.0)
MCH: 32.1 pg (ref 27.2–33.4)
MCHC: 33.5 g/dL (ref 32.0–36.0)
MCV: 95.9 fL (ref 79.3–98.0)
MONO#: 0.6 10*3/uL (ref 0.1–0.9)
MONO%: 14.3 % — AB (ref 0.0–14.0)
NEUT%: 66.2 % (ref 39.0–75.0)
NEUTROS ABS: 2.9 10*3/uL (ref 1.5–6.5)
Platelets: 190 10*3/uL (ref 140–400)
RBC: 4.38 10*6/uL (ref 4.20–5.82)
RDW: 14.3 % (ref 11.0–14.6)
WBC: 4.5 10*3/uL (ref 4.0–10.3)

## 2017-07-27 NOTE — Telephone Encounter (Signed)
Spoke with patient regarding appt added per 11/20 sch msg - patient said they could not come in that early for a lab as he cannot get around fast enough to get to his CT scan.

## 2017-07-28 ENCOUNTER — Encounter (HOSPITAL_COMMUNITY): Payer: Self-pay | Admitting: Radiology

## 2017-07-28 ENCOUNTER — Other Ambulatory Visit: Payer: Medicare Other

## 2017-07-28 ENCOUNTER — Ambulatory Visit (HOSPITAL_COMMUNITY)
Admission: RE | Admit: 2017-07-28 | Discharge: 2017-07-28 | Disposition: A | Discharge status: Home / Self Care | Payer: Medicare Other | Source: Ambulatory Visit | Attending: Nurse Practitioner | Admitting: Nurse Practitioner

## 2017-07-28 DIAGNOSIS — I2510 Atherosclerotic heart disease of native coronary artery without angina pectoris: Secondary | ICD-10-CM | POA: Diagnosis not present

## 2017-07-28 DIAGNOSIS — N400 Enlarged prostate without lower urinary tract symptoms: Secondary | ICD-10-CM | POA: Insufficient documentation

## 2017-07-28 DIAGNOSIS — M1612 Unilateral primary osteoarthritis, left hip: Secondary | ICD-10-CM | POA: Diagnosis not present

## 2017-07-28 DIAGNOSIS — C20 Malignant neoplasm of rectum: Secondary | ICD-10-CM | POA: Diagnosis not present

## 2017-07-28 DIAGNOSIS — I700 Atherosclerosis of aorta: Secondary | ICD-10-CM | POA: Insufficient documentation

## 2017-07-28 DIAGNOSIS — R590 Localized enlarged lymph nodes: Secondary | ICD-10-CM | POA: Diagnosis not present

## 2017-07-28 MED ORDER — IOPAMIDOL (ISOVUE-300) INJECTION 61%
100.0000 mL | Freq: Once | INTRAVENOUS | Status: AC | PRN
  Administered 2017-07-28: 100 mL via INTRAVENOUS

## 2017-07-28 MED ORDER — IOPAMIDOL (ISOVUE-300) INJECTION 61%
INTRAVENOUS | Status: AC
  Administered 2017-07-28: 100 mL via INTRAVENOUS
  Filled 2017-07-28: qty 100

## 2017-07-28 NOTE — Progress Notes (Signed)
[place]  [phone] Fax:(336) [phone]  Clinic follow up Note   Patient Care Team: [name], MD as PCP - General (Family Medicine) Ronald Lobo, MD as Consulting Physician (Gastroenterology) Tania Ade, RN as Registered Nurse 08/02/17  CHIEF COMPLAINTS:  Follow up rectal cancer  Oncology History    Rectal cancer [facility])   Staging form: Colon and Rectum, AJCC 7th Edition     Clinical stage from 08/08/2015: Stage IIIB (T3, N1, M0) - Unsigned       Rectal cancer ([place])   07/16/2015 Pathology Results    Biopsy positive for invasive adenocarcinoma with signet ring cell features      07/23/2015 Initial Diagnosis    Rectal cancer ([place])      08/12/2015 Concurrent Chemotherapy    Xeloda 1500mg  q12h on the day of radiation       08/12/2015 - 09/20/2015 Radiation Therapy    radiaiton to rectal cancer       02/18/2016 Procedure    FLEX SIGMOIDOSCOPY: Endoscopic disappearance of previous rectal mass; atrophic mucosa in distal rectum  per Dr. Cristina Gong      02/20/2016 Pathology Results    No adenomatous change or malignancy      05/11/2017 Imaging    CT CAP IMPRESSION: 1. Newly enlarged 1.3 cm in short axis aortocaval node, while not entirely specific, is concerning for recurrent malignancy. No other indicators of recurrence identified. Biopsy or nuclear medicine PET-CT could be utilized to provide greater specificity if clinically warranted. 2. Continued abnormal wall thickening in the inferior rectum. Surrounding perirectal and presacral stranding likely from prior radiation therapy. 3. Prostatomegaly, prostate volume 93 cubic cm. 4. Other imaging findings of potential clinical significance: Aortic Atherosclerosis (ICD10-I70.0). Coronary atherosclerosis with mild cardiomegaly. Markedly severe chronic arthropathy in the left hip. Suspected cyst in segment 6 of the liver, unchanged. Prominent stool throughout the colon favors constipation.  Direct right inguinal hernia contains adipose tissue. Possible retraction of the left testicle. 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      07/28/2017 Imaging    CT AP W Contrast 07/28/17  IMPRESSION: 1. Residual rectal wall thickening, as before. Mildly enlarged aortocaval lymph node is stable. No additional evidence of metastatic disease. 2. Aortic atherosclerosis (ICD10-170.0). Three-vessel coronary artery calcification. 3. Enlarged prostate. 4. Advanced left hip osteoarthritis.        HISTORY OF PRESENTING ILLNESS:  Nathaniel [name] 83 y.o. male is here because of recently diagnosed rectal cancer. He presents to the clinic by himself.  He has had abnormal bowel movement for the past 2-3 months. It's usually very small amount, loose, every time he urinates, he has a such a small bowel movement. He does not really have a good normal bowel movement. He also reports frequent stool stains on his underwear. He noticed small amount blood in his stool recently, which prompt his seeing GI Dr. Cristina Gong on. He underwent a flexible sigmoidoscopy in the office, which showed a rectal mass, biopsy reviewed adenocarcinoma. He denies significant rectal pain, abdominal discomfort, or other new complaints.  He lives in a independent living facility, he is able to take care of his all ADLs, and does some light housework, shopping, by himself. He uses a walker, still drives. He does get fatigued after activity, his energy level has seemed to be decreasing in over the past few years. No significant change daily. He has decent appetite and eats well.  He had remote history of ulcerative colitis,  which has been in remission. 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  He is widowed, has 5 children, who all live in out states. He does have several stepchildren from his secondary to, and some of them live in [place].  CURRENT  TREATMENT: observation  INTERIM HISTORY  Mr. Marcoux returns for follow-up. He presents to the clinic today noting no new changes since last visit. He denies any pain and after his CT scan, 5 days ago he had increase in stool most of that day, short of diarrhea. He spent 4 hours in the bathroom. He notes having BM every 4 days. He reports once a month he will see mild blood in his stool that presents on the tissue when he wipes. The first wipe has the most and after 3 wipes it will be gone. He had a sigmoidoscope in 07/2015. He received his flu shot for this year already. Overall he is doing well.     MEDICAL HISTORY:  Past Medical History:  Diagnosis Date  . Anemia due to antineoplastic chemotherapy   . Atherosclerosis of coronary artery cardiologist-  dr Aundra Dubin   aorta and coronary arteries moderate per ct 02-07-2016  . Basal cell carcinoma    40+  . Bladder calculi   . BPH (benign prostatic hyperplasia)   . Calculi, urethra   . Chronic low back pain   . Constipation   . ED (erectile dysfunction)   . Edema of lower extremity   . Family history of melanoma   . Foley catheter in place   . History of adenomatous polyp of colon    2007  . History of basal cell carcinoma excision    multiple  . History of cellulitis    left lower leg-- resolved  . History of melanoma excision    temple right side/ multiple  . History of obstructive sleep apnea    per pt cured by hypnosis  . History of pericarditis    acute episode 09/ 2014  per cardiologist note , dr Aundra Dubin , related possibly to xarelto use  . History of ulcerative colitis    remote  . HTN (hypertension)   . Hypercholesteremia   . Hypothyroidism   . Incomplete right bundle branch block   . LAFB (left anterior fascicular block)   . Melanoma ([place])   . OA (osteoarthritis)   . PAF (paroxysmal atrial fibrillation) [facility]) cardiologist-  dr dal[place] Aundra Dubin   dx 09/ 2014  in setting of acute pericarditis   . Rectal cancer [facility]) dx  07-16-2015  oncologist-  dr [name]   Stage IIIB (cT3, N1, M0)-- concurrent  chemotherapy started 08-12-2015/  radiation complete (08-12-2015 to 09-20-2015)  . S/P radiation therapy 08/12/15-09/20/15   rectal ca50.4Gy total dose  . Spinal stenosis     SURGICAL HISTORY: Past Surgical History:  Procedure Laterality Date  . CATARACT EXTRACTION W/ INTRAOCULAR LENS IMPLANT Bilateral 10/ 2016  . CYSTOSCOPY WITH LITHOLAPAXY N/A 02/12/2016   Procedure: CYSTOSCOPY WITH LITHOLAPAXY;  Surgeon: Alexis Frock, MD;  Location: [facility];  Service: Urology;  Laterality: N/A;  . EUS N/A 08/08/2015   Procedure: LOWER ENDOSCOPIC ULTRASOUND (EUS);  Surgeon: Arta Silence, MD;  Location: [facility] ENDOSCOPY;  Service: Endoscopy;  Laterality: N/A;  . FLEXIBLE SIGMOIDOSCOPY N/A 08/08/2015   Procedure: FLEXIBLE SIGMOIDOSCOPY;  Surgeon: Arta Silence, MD;  Location: [facility] ENDOSCOPY;  Service: Endoscopy;  Laterality: N/A;  poor prep  . HOLMIUM LASER APPLICATION N/A  02/12/2016   Procedure: HOLMIUM LASER APPLICATION;  Surgeon: Alexis Frock, MD;  Location: [facility];  Service: Urology;  Laterality: N/A;  . INGUINAL HERNIA REPAIR Left 1965  . LEFT KNEE SURGERY  1940  . MOHS SURGERY  x9  last one 2014   . ORIF LEFT ANKLE FX  1935  . ROTATOR CUFF REPAIR Bilateral right 2013/  left 2010  . TRANSTHORACIC ECHOCARDIOGRAM  06-06-2013   ef 55-60%/  mild AR/  mild dilated aortic root/  mild LAE and RAE/  mild to moderate TR/  small pericardial effusion identified    SOCIAL HISTORY: Social History   Socioeconomic History  . Marital status: Married    Spouse name: None  . Number of children: None  . Years of education: None  . Highest education level: None  Social Needs  . Financial resource strain: None  . Food insecurity - worry: None  . Food insecurity - inability: None  . Transportation needs - medical: None  . Transportation needs - non-medical: None  Occupational History  . None    Tobacco Use  . Smoking status: Never Smoker  . Smokeless tobacco: Never Used  Substance and Sexual Activity  . Alcohol use: Yes    Alcohol/week: 0.6 oz    Types: 1 Glasses of wine per week    Comment: 1 glass of wine per week  . Drug use: No  . Sexual activity: None  Other Topics Concern  . 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 Stroke Father   . Heart disease Father   . Parkinson's disease Mother   . Heart disease Brother   . Melanoma Brother 78       metastatic and secondary cancer  . Heart disease Maternal Uncle   . Cancer Daughter 46       muscle/deep in thigh cancer (myosarcoma? perhaps)  . Cancer Daughter        thyroid  . Cancer - Cervical Daughter        Parotid gland    ALLERGIES:  is allergic to cinnamon; ibuprofen; percocet [oxycodone-acetaminophen]; protonix [pantoprazole sodium]; and xarelto [rivaroxaban].  MEDICATIONS:  Current Outpatient Medications  Medication Sig Dispense Refill  . aspirin EC 325 MG tablet Take 325 mg by mouth daily.     . finasteride (PROSCAR) 5 MG tablet Take 5 mg by mouth every evening. Dose is taken at bedtime    . furosemide (LASIX) 20 MG tablet TAKE 1 TABLET BY MOUTH DAILY 90 tablet 1  . KLOR-CON 10 10 MEQ tablet TAKE 1 TABLET BY MOUTH DAILY 90 tablet 1  . levothyroxine (SYNTHROID, LEVOTHROID) 100 MCG tablet TAKE 1 TABLET ON AN EMPTY STOMACH IN THE MORNING ONCE A DAY  2  . metoprolol succinate (TOPROL-XL) 25 MG 24 hr tablet TAKE 1/2 TABLET BY MOUTH DAILY. 45 tablet 1  . Multiple Vitamin (MULTIVITAMIN WITH MINERALS) TABS tablet Take  1 tablet by mouth daily.     No current facility-administered medications for this visit.     REVIEW OF SYSTEMS:  Constitutional: Denies fevers, chills  or abnormal night sweats  Eyes: Denies blurriness of vision, double vision or watery eyes Ears, nose, mouth, throat, and face: Denies mucositis or sore throat Respiratory: Denies cough, wheezes Cardiovascular: Denies palpitation, chest discomfort or lower extremity swelling Gastrointestinal:  Denies nausea, heartburn or change in bowel habits (+) occasional Hematochezia Skin: Denies abnormal skin rashes Lymphatics: Denies new lymphadenopathy or easy bruising Neurological:Denies numbness, tingling or new weaknesses Behavioral/Psych: Mood is stable, no new changes  All other systems were reviewed with the patient and are negative.  PHYSICAL EXAMINATION:  ECOG PERFORMANCE STATUS: 2-3 BP (!) 170/67 (BP Location: Left Arm)   Pulse 64   Temp 97.7 F (36.5 C) (Oral)   Resp 20   Ht 6' (1.829 m)   Wt 199 lb 6.4 oz (90.4 kg)   SpO2 97%   BMI 27.04 kg/m  GENERAL:alert, no distress and comfortable SKIN: skin color, texture, turgor are normal, no rashes or significant lesions except surgical change on right cheek and nose  EYES: normal, conjunctiva are pink and non-injected, sclera clear OROPHARYNX:no exudate, no erythema and lips, buccal mucosa, and tongue normal  NECK: supple, thyroid normal size, non-tender, without nodularity LYMPH:  no palpable lymphadenopathy in the cervical, axillary or inguinal LUNGS: clear to auscultation and percussion with normal breathing effort HEART: regular rate & rhythm and no murmurs and no lower extremity edema ABDOMEN:abdomen soft, non-tender and normal bowel sounds. No enlarged inguinal lymph nodes.  PSYCH: alert & oriented x 3 with fluent speech.  NEURO: no focal motor/sensory deficits   LABORATORY DATA:  I have reviewed the data as listed  CBC Latest Ref Rng & Units 07/27/2017 05/13/2017 05/06/2017   WBC 4.0 - 10.3 10e3/uL 4.5 4.4 4.7  Hemoglobin 13.0 - 17.1 g/dL 14.1 13.7 13.6  Hematocrit 38.4 - 49.9 % 42.0 41.5 40.5  Platelets 140 - 400 10e3/uL 190 186 178   CMP Latest Ref Rng & Units 07/27/2017 05/13/2017 05/06/2017  Glucose 70 - 140 mg/dl 144(H) 94 113  BUN 7.0 - 26.0 mg/dL 14.1 13.0 21.4  Creatinine 0.7 - 1.3 mg/dL 1.0 1.0 1.0  Sodium 136 - 145 mEq/L 141 140 140  Potassium 3.5 - 5.1 mEq/L 4.1 4.4 4.4  Chloride 101 - 111 mmol/L - - -  CO2 22 - 29 mEq/L 28 27 27   Calcium 8.4 - 10.4 mg/dL 9.4 9.8 9.4  Total Protein 6.4 - 8.3 g/dL 6.8 7.0 6.5  Total Bilirubin 0.20 - 1.20 mg/dL 0.56 0.53 0.57  Alkaline Phos 40 - 150 U/L 97 99 101  AST 5 - 34 U/L 24 26 22   ALT 0 - 55 U/L 21 16 17     Results for BOUBACAR, LERETTE (MRN [id number]) as of 07/28/2017 11:04  Ref. Range 10/14/2016 11:33 02/10/2017 09:06 05/13/2017 12:39  CEA Latest Ref Range: 0.0 - 4.7 ng/mL 5.3 (H)    CEA ([facility]) Latest Ref Range: 0.00 - 5.00 ng/mL 5.90 (H) 4.28 4.60    PATHOLOGY REPORT  Diagnosis 07/16/2015  Rectum, biopsy, mass - INVASIVE ADENOCARCINOMA WITH SIGNET RING CELL FEATURES. - LYMPHOVASCULAR INVASION IS IDENTIFIED. - SEE COMMENT. Microscopic Comment To help evaluate the case, immunohistochemistry stains were performed. The malignant cells are positive for CDX2 and cytokeratin AE1/AE3. There is focal staining for CD56, a neuroendocrine marker. Prostein and S-100 stains are negative. Overall, the features are consistent with invasive adenocarcinoma of colonic origin, with signet ring cell features. The case was discussed with Dr. Cristina Gong on 07/18/15. (JBK:ds 07/18/15)   PROCEDURES  Flexible  Sigmoidoscopy 07/16/2015 by Dr. Cristina Gong  impression , an infiltrative no obstructing large mass was found in the distal rectum. The mass was partially circumferential (involving one third of the lumen circumference ). The mass measured 5 cm in length.  Biopsy was obtained.  LOWER ENDOSCOPY [place] 08/08/2015   STAGING: T3 N1 Mx by endorectal ultrasound (limited study). ENDOSCOPIC IMPRESSION: Poor prep; unable to do colonoscopy. Rectal mass (adenocarcinoma on biopsies). Staging as above.   RADIOGRAPHIC STUDIES: I have personally reviewed the radiological images as listed and agreed with the findings in the report. Ct Abdomen Pelvis W Contrast  Result Date: 07/28/2017 CLINICAL DATA:  Rectal cancer, radiation therapy and chemotherapy completed. EXAM: CT ABDOMEN AND PELVIS WITH CONTRAST TECHNIQUE: Multidetector CT imaging of the abdomen and pelvis was performed using the standard protocol following bolus administration of intravenous contrast. CONTRAST:  100 cc Isovue-300. COMPARISON:  05/11/2017. FINDINGS: Lower chest: Lung bases show no acute findings. Heart is enlarged. Aortic valvular calcification. Three-vessel coronary artery calcification. No pericardial or pleural effusion. Hepatobiliary: Subcentimeter low-attenuation lesions in the liver are too small to characterize but unchanged. Liver and gallbladder are otherwise unremarkable. No biliary ductal dilatation. Pancreas: Negative. Spleen: Negative. Adrenals/Urinary Tract: Adrenal glands are unremarkable. Subcentimeter low-attenuation lesions in the kidneys are too small to characterize. Kidneys are otherwise unremarkable. Ureters are decompressed. Bladder is grossly unremarkable. Stomach/Bowel: Stomach, small bowel, appendix and majority of the colon are unremarkable. Fair amount of stool is seen in the colon. Mild rectal wall thickening. Vascular/Lymphatic: Atherosclerotic calcification of the arterial vasculature. Mildly ectatic right common iliac artery, 1.6 cm. Aortocaval lymph node measures 12 mm, stable. No additional pathologically enlarged lymph nodes. Reproductive: Prostate is enlarged. Other: Small right inguinal hernia contains fat. Mild presacral edema and/or post treatment scarring. Mesenteries and peritoneum are unremarkable. Musculoskeletal:  Degenerative changes in the hips, advanced on the left. Degenerative changes in the spine. No worrisome lytic or sclerotic lesions. IMPRESSION: 1. Residual rectal wall thickening, as before. Mildly enlarged aortocaval lymph node is stable. No additional evidence of metastatic disease. 2. Aortic atherosclerosis (ICD10-170.0). Three-vessel coronary artery calcification. 3. Enlarged prostate. 4. Advanced left hip osteoarthritis. Electronically Signed   By: Lorin Picket M.D.   On: 07/28/2017 12:15     CT AP W Contrast 07/28/17  IMPRESSION: 1. Residual rectal wall thickening, as before. Mildly enlarged aortocaval lymph node is stable. No additional evidence of metastatic disease. 2. Aortic atherosclerosis (ICD10-170.0). Three-vessel coronary artery calcification. 3. Enlarged prostate. 4. Advanced left hip osteoarthritis.   CT chest abdomen pelvis w contrast 02/07/2016 IMPRESSION: 1. Grossly stable anorectal wall thickening compared with previous study. No adjacent adenopathy or evidence of distant metastatic disease. 2. Multiple bladder calculi are again noted status post Foley catheter placement. In addition, there are multiple calculi within the urethra around the catheter. The prostate gland is moderately enlarged. 3. Moderate stool throughout the colon without evidence of obstruction. 4. Moderate atherosclerosis and severe left hip osteoarthritis again noted.  CT CAP 05/11/2017 IMPRESSION: 1. Newly enlarged 1.3 cm in short axis aortocaval node, while not entirely specific, is concerning for recurrent malignancy. No other indicators of recurrence identified. Biopsy or nuclear medicine PET-CT could be utilized to provide greater specificity if clinically warranted. 2. Continued abnormal wall thickening in the inferior rectum. Surrounding perirectal and presacral stranding likely from prior radiation therapy. 3. Prostatomegaly, prostate volume 93 cubic cm. 4. Other imaging findings of  potential clinical significance: Aortic Atherosclerosis (ICD10-I70.0). Coronary atherosclerosis with mild cardiomegaly. Markedly severe chronic arthropathy in  the left hip. Suspected cyst in segment 6 of the liver, unchanged. Prominent stool throughout the colon favors constipation. Direct right inguinal hernia contains adipose tissue. Possible retraction of the left testicle. 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  ASSESSMENT & PLAN:  83 y.o. Caucasian male, with past medical history of ulcerative colitis, hypertension and hypothyroidism, who lives in a independent living, presented with bowel habits change and rectal bleeding.  1. Low rectal adenocarcinoma, cT3N1M0, stage IIIB  -he is s/p concurrent chemotherapy and radiation, tolerated very well overall. His symptoms improved after treatment. -He declined surgery due to his advanced age and long recovery from surgery -I previously discussed that his rectal cancer is unlikely cured by neoadjuvant chemotherapy and radiation alone. He will likely grow again without surgical resection, which can cause obstruction, bleeding and pain. He may need diverging colostomy down the road. Rectal stent placement may not be an option due to the distal location -flexible sigmoidoscopy by Dr. Alycia Rossetti, per pt no significant residual tumor after chemo RT -He is clinically doing well,  physical exam was unremarkable. Labs reviewed with patient, blood count normal   -I reviewed his CT CAP from 05/11/17 which shows newly enlarged 1.3 cm aortocaval node concerning for recurrence. No other mets -Due to his advanced age, I previously discussed that I may not offer chemotherapy if he has disease recurrence. We discussed the role of palliative radiation or other systemic therapy, such as immunotherapy, if needed. -His genetic testing was negative.  -We discussed his CT Cap from 07/28/17 which shows the aortocaval lymph node has reduced in size slightly  since last scan and no evidence of disease recurrence.  -It has been 2 years since his diagnosis with little change post chemo and radiation.  -07/27/17 Labs reviewed today, he is asymptomatic and doing well clinically  -F/u in 4 months   2. Low back pain -secondary to spinal stenosis and rectal pain, It has resolved after he completed chemoradiation  3. HTN, AF, arthritis -He will continue follow-up with his primary care physician -will watch his blood pressure and heart rate to closely during the concurrent chemoradiation.  4. Anemia -developed after chemoRT -resolved now   5. Skin cancer including melanoma  -He will continue to follow-up with his dermatologist   Plan -lab and scan reviewed, NED -Lab and f/u in 4 months    All questions were answered. The patient knows to call the clinic with any problems, questions or concerns.  I spent 20 minutes counseling the patient face to face. The total time spent in the appointment was 25  minutes and more than 50% was on counseling.  This document serves as a record of services personally performed by [name], MD. It was created on her behalf by Joslyn Devon, a trained medical scribe. The creation of this record is based on the scribe's personal observations and the provider's statements to them.    I have reviewed the above documentation for accuracy and completeness, and I agree with the above.    [name], MD 08/02/2017

## 2017-08-02 ENCOUNTER — Other Ambulatory Visit: Payer: Medicare Other

## 2017-08-02 ENCOUNTER — Encounter: Payer: Self-pay | Admitting: Hematology

## 2017-08-02 ENCOUNTER — Ambulatory Visit (HOSPITAL_BASED_OUTPATIENT_CLINIC_OR_DEPARTMENT_OTHER): Payer: Medicare Other | Admitting: Hematology

## 2017-08-02 ENCOUNTER — Telehealth: Payer: Self-pay

## 2017-08-02 VITALS — BP 170/67 | HR 64 | Temp 97.7°F | Resp 20 | Ht 72.0 in | Wt 199.4 lb

## 2017-08-02 DIAGNOSIS — C20 Malignant neoplasm of rectum: Secondary | ICD-10-CM | POA: Diagnosis not present

## 2017-08-02 DIAGNOSIS — I10 Essential (primary) hypertension: Secondary | ICD-10-CM

## 2017-08-02 DIAGNOSIS — K921 Melena: Secondary | ICD-10-CM | POA: Diagnosis not present

## 2017-08-02 DIAGNOSIS — Z85820 Personal history of malignant melanoma of skin: Secondary | ICD-10-CM

## 2017-08-02 DIAGNOSIS — I480 Paroxysmal atrial fibrillation: Secondary | ICD-10-CM

## 2017-08-02 DIAGNOSIS — I4891 Unspecified atrial fibrillation: Secondary | ICD-10-CM | POA: Diagnosis not present

## 2017-08-02 DIAGNOSIS — M1990 Unspecified osteoarthritis, unspecified site: Secondary | ICD-10-CM | POA: Diagnosis not present

## 2017-08-02 NOTE — Telephone Encounter (Signed)
Printed avs and calender for upcoming appointment. Per 11/26 los 

## 2017-08-05 ENCOUNTER — Telehealth: Payer: Self-pay | Admitting: Nurse Practitioner

## 2017-08-05 NOTE — Telephone Encounter (Signed)
Pt calling wanting to make sure that Cecille Rubin has seen the result from his CT scan last week (ordered by oncology). CT read: " heart is enlarged.  Aortic valvular calcification & 3-vessel coronary artery calcification "  He would appreciate her take on this and if he should be concerned for any reason. He understands she is out of the office today and someone will call once Cecille Rubin has been able to look over CT. He is Patent attorney.

## 2017-08-05 NOTE — Telephone Encounter (Signed)
Nathaniel [name] is calling because he is wanting to Speak with you about his CT Scan . Please Call

## 2017-08-06 NOTE — Telephone Encounter (Signed)
Informed patient that the cardiac findings on CT are age related and not surprising.  Per Truitt Nathaniel [name], informed him there are no changes in treatment at this time. Patient understands to call if he gets any symptoms. He was grateful for assistance.

## 2017-08-06 NOTE — Telephone Encounter (Signed)
Please let him know that I looked at the scan.  These cardiac findings are age related and not surprising. He was doing very well at our visit without chest pain or other cardiac complaints.  I would recommend that we continue to monitor for symptoms.  I would not change his current plan of care from our standpoint.

## 2017-08-07 ENCOUNTER — Other Ambulatory Visit: Payer: Self-pay | Admitting: Nurse Practitioner

## 2017-08-07 DIAGNOSIS — I480 Paroxysmal atrial fibrillation: Secondary | ICD-10-CM

## 2017-08-07 DIAGNOSIS — R0602 Shortness of breath: Secondary | ICD-10-CM

## 2017-08-12 ENCOUNTER — Ambulatory Visit: Payer: Medicare Other | Admitting: Nurse Practitioner

## 2017-08-12 ENCOUNTER — Other Ambulatory Visit: Payer: Medicare Other

## 2017-10-04 DIAGNOSIS — M25562 Pain in left knee: Secondary | ICD-10-CM | POA: Diagnosis not present

## 2017-10-04 DIAGNOSIS — R262 Difficulty in walking, not elsewhere classified: Secondary | ICD-10-CM | POA: Diagnosis not present

## 2017-10-04 DIAGNOSIS — M23262 Derangement of other lateral meniscus due to old tear or injury, left knee: Secondary | ICD-10-CM | POA: Diagnosis not present

## 2017-10-05 DIAGNOSIS — H35073 Retinal telangiectasis, bilateral: Secondary | ICD-10-CM | POA: Diagnosis not present

## 2017-10-18 IMAGING — CT NM PET TUM IMG INITIAL (PI) SKULL BASE T - THIGH
1 of 8 series · 1 of 25 positions shown · non-contrast
Comparison: Clinic note of 07/23/2015.

CLINICAL DATA: Initial treatment strategy for new diagnosis of
rectal cancer. Staging.. History of melanoma and remote history of
ulcerative colitis.

EXAM:
NUCLEAR MEDICINE PET SKULL BASE TO THIGH
TECHNIQUE: 11 point to mCi F-18 FDG was injected intravenously. Full-ring PET
imaging was performed from the skull base to thigh after the
radiotracer. CT data was obtained and used for attenuation
correction and anatomic localization.
FASTING BLOOD GLUCOSE:  Value: 92 mg/dl

[Series 3: pet sk_thigh ac · axial · 5.0mm · 4.07mm/px · 1 of 227 slices shown]
[im 151/227]
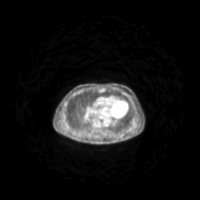

[1 of 25 positions shown; findings below may reference images not displayed]

FINDINGS: NECK

No areas of abnormal hypermetabolism.

CHEST

Focal hypermetabolism at the distal esophagus is without CT
correlate and favored to be physiologic. This measures a S.U.V. max
of 5.8, including on image 95/series 4.

ABDOMEN/PELVIS

Multifocal colonic hypermetabolism is primarily felt to be
physiologic. Anal rectal wall thickening and hypermetabolism,
consistent with the clinical history of primary malignancy. This
measures a S.U.V. max of 10.9, including on image 183/series 4. No
hypermetabolic pelvic sidewall nodes are seen. There is transmural
extension and/or a a perirectal node at the 11 o'clock position on
image 183/ series 4. This measures 8 mm. Although no high-grade
obstruction is seen, there is a large volume of proximal stool.

A separate area of more cephalad rectosigmoid junction
underdistention and hypermetabolism is identified. This measures a
S.U.V. max of 12.7, including on image 166/series 4.

SKELETON

Osteoarthritis of the left hip is advanced with concurrent
hypermetabolism.

CT IMAGES PERFORMED FOR ATTENUATION CORRECTION

No cervical adenopathy. Moderate cardiomegaly with multivessel
coronary artery atherosclerosis. Pulmonary artery enlargement,
outflow tract 3.7 cm. No small bowel obstruction. Mild ectasia of
the right common iliac artery. Fat containing right inguinal hernia.
Moderate prostatomegaly. Multiple (greater than 10) bladder stones
on the order of 8 mm.
IMPRESSION: 1. Anorectal primary. Transmural spread and/or perirectal nodal
disease, without distant hypermetabolic metastasis.
2. Large colonic stool burden more proximally may represent a
component of partial obstruction. No small bowel distention to
suggest proximal propagation.
3. Separate area of rectosigmoid junction underdistention and
hypermetabolism. Correlate with results of prior colonoscopy. If the
prior exam was not able to traverse the primary anal rectal lesion,
a synchronous carcinoma cannot be entirely excluded.
4. Prostatomegaly with multiple bladder stones, suggesting a
component of bladder outlet obstruction.
5. Cardiomegaly with coronary artery atherosclerosis and pulmonary
artery enlargement. The latter finding suggests pulmonary arterial
hypertension.

## 2017-11-11 DIAGNOSIS — Z86008 Personal history of in-situ neoplasm of other site: Secondary | ICD-10-CM | POA: Diagnosis not present

## 2017-11-11 DIAGNOSIS — Z85048 Personal history of other malignant neoplasm of rectum, rectosigmoid junction, and anus: Secondary | ICD-10-CM | POA: Diagnosis not present

## 2017-11-11 DIAGNOSIS — E782 Mixed hyperlipidemia: Secondary | ICD-10-CM | POA: Diagnosis not present

## 2017-11-11 DIAGNOSIS — Z8679 Personal history of other diseases of the circulatory system: Secondary | ICD-10-CM | POA: Diagnosis not present

## 2017-11-11 DIAGNOSIS — E039 Hypothyroidism, unspecified: Secondary | ICD-10-CM | POA: Diagnosis not present

## 2017-11-11 DIAGNOSIS — I10 Essential (primary) hypertension: Secondary | ICD-10-CM | POA: Diagnosis not present

## 2017-11-13 DIAGNOSIS — Z23 Encounter for immunization: Secondary | ICD-10-CM | POA: Diagnosis not present

## 2017-11-26 NOTE — Progress Notes (Signed)
[place]  [phone] Fax:([phone]) [phone]  Clinic follow up Note   Patient Care Team: [name], MD as PCP - General (Family Medicine) Ronald Lobo, MD as Consulting Physician (Gastroenterology) Tania Ade, RN as Registered Nurse 11/29/17  CHIEF COMPLAINTS:  Follow up rectal cancer  Oncology History    Rectal cancer [facility])   Staging form: Colon and Rectum, AJCC 7th Edition     Clinical stage from 08/08/2015: Stage IIIB (T3, N1, M0) - Unsigned       Rectal cancer ([place])   07/16/2015 Pathology Results    Biopsy positive for invasive adenocarcinoma with signet ring cell features      07/23/2015 Initial Diagnosis    Rectal cancer ([place])      08/12/2015 Concurrent Chemotherapy    Xeloda 1500mg  q12h on the day of radiation       08/12/2015 - 09/20/2015 Radiation Therapy    radiaiton to rectal cancer       02/18/2016 Procedure    FLEX SIGMOIDOSCOPY: Endoscopic disappearance of previous rectal mass; atrophic mucosa in distal rectum  per Dr. Cristina Gong      02/20/2016 Pathology Results    No adenomatous change or malignancy      05/11/2017 Imaging    CT CAP IMPRESSION: 1. Newly enlarged 1.3 cm in short axis aortocaval node, while not entirely specific, is concerning for recurrent malignancy. No other indicators of recurrence identified. Biopsy or nuclear medicine PET-CT could be utilized to provide greater specificity if clinically warranted. 2. Continued abnormal wall thickening in the inferior rectum. Surrounding perirectal and presacral stranding likely from prior radiation therapy. 3. Prostatomegaly, prostate volume 93 cubic cm. 4. Other imaging findings of potential clinical significance: Aortic Atherosclerosis (ICD10-I70.0). Coronary atherosclerosis with mild cardiomegaly. Markedly severe chronic arthropathy in the left hip. Suspected cyst in segment 6 of the liver, unchanged. Prominent stool throughout the colon favors constipation.  Direct right inguinal hernia contains adipose tissue. Possible retraction of the left testicle. 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      07/28/2017 Imaging    CT AP W Contrast 07/28/17  IMPRESSION: 1. Residual rectal wall thickening, as before. Mildly enlarged aortocaval lymph node is stable. No additional evidence of metastatic disease. 2. Aortic atherosclerosis (ICD10-170.0). Three-vessel coronary artery calcification. 3. Enlarged prostate. 4. Advanced left hip osteoarthritis.        HISTORY OF PRESENTING ILLNESS:  Nathaniel [name] 84 y.o. male is here because of recently diagnosed rectal cancer. He presents to the clinic by himself.  He has had abnormal bowel movement for the past 2-3 months. It's usually very small amount, loose, every time he urinates, he has a such a small bowel movement. He does not really have a good normal bowel movement. He also reports frequent stool stains on his underwear. He noticed small amount blood in his stool recently, which prompt his seeing GI Dr. Cristina Gong on. He underwent a flexible sigmoidoscopy in the office, which showed a rectal mass, biopsy reviewed adenocarcinoma. He denies significant rectal pain, abdominal discomfort, or other new complaints.  He lives in a independent living facility, he is able to take care of his all ADLs, and does some light housework, shopping, by himself. He uses a walker, still drives. He does get fatigued after activity, his energy level has seemed to be decreasing in over the past few years. No significant change daily. He has decent appetite and eats well.  He had remote history of ulcerative colitis,  which has been in remission. 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  He is widowed, has 6 children, who all live in out states. He does have several stepchildren from his secondary to, and some of them live in [place].  CURRENT  TREATMENT: Observation  INTERIM HISTORY  GARNETT NUNZIATA returns for follow-up. He presents to the clinic today by himself. He notes he is doing well overall. He states he energy level is adequate. He reports his bowel movements have been the same, periods with out them and periods with a large amount. He has 1 large bowel movement each week.he does have blood with wiping every once in awhile.   On review of systems, pt denies any other complaints at this time. Pertinent positives are listed and detailed within the above HPI.   MEDICAL HISTORY:  Past Medical History:  Diagnosis Date  . Anemia due to antineoplastic chemotherapy   . Atherosclerosis of coronary artery cardiologist-  dr Aundra Dubin   aorta and coronary arteries moderate per ct 02-07-2016  . Basal cell carcinoma    40+  . Bladder calculi   . BPH (benign prostatic hyperplasia)   . Calculi, urethra   . Chronic low back pain   . Constipation   . ED (erectile dysfunction)   . Edema of lower extremity   . Family history of melanoma   . Foley catheter in place   . History of adenomatous polyp of colon    2007  . History of basal cell carcinoma excision    multiple  . History of cellulitis    left lower leg-- resolved  . History of melanoma excision    temple right side/ multiple  . History of obstructive sleep apnea    per pt cured by hypnosis  . History of pericarditis    acute episode 09/ 2014  per cardiologist note , dr Aundra Dubin , related possibly to xarelto use  . History of ulcerative colitis    remote  . HTN (hypertension)   . Hypercholesteremia   . Hypothyroidism   . Incomplete right bundle branch block   . LAFB (left anterior fascicular block)   . Melanoma ([place])   . OA (osteoarthritis)   . PAF (paroxysmal atrial fibrillation) [facility]) cardiologist-  dr dalton Aundra Dubin   dx 09/ 2014  in setting of acute pericarditis   . Rectal cancer [facility]) dx 07-16-2015  oncologist-  dr [name]   Stage IIIB (cT3, N1, M0)--  concurrent  chemotherapy started 08-12-2015/  radiation complete (08-12-2015 to 09-20-2015)  . S/P radiation therapy 08/12/15-09/20/15   rectal ca50.4Gy total dose  . Spinal stenosis     SURGICAL HISTORY: Past Surgical History:  Procedure Laterality Date  . CATARACT EXTRACTION W/ INTRAOCULAR LENS IMPLANT Bilateral 10/ 2016  . CYSTOSCOPY WITH LITHOLAPAXY N/A 02/12/2016   Procedure: CYSTOSCOPY WITH LITHOLAPAXY;  Surgeon: Alexis Frock, MD;  [facility];  Service: Urology;  Laterality: N/A;  . EUS N/A 08/08/2015   Procedure: LOWER ENDOSCOPIC ULTRASOUND (EUS);  Surgeon: Arta Silence, MD;  Location: [facility]COPY;  Service: Endoscopy;  Laterality: N/A;  . FLEXIBLE SIGMOIDOSCOPY N/A 08/08/2015   Procedure: FLEXIBLE SIGMOIDOSCOPY;  Surgeon: Arta Silence, MD;  Location[facility] ENDOSCOPY;  Service: Endoscopy;  Laterality: N/A;  poor prep  . HOLMIUM LASER APPLICATION N/A 10/09/2977   Procedure: HOLMIUM LASER APPLICATION;  Surgeon: Alexis Frock, MD;  Location: [facility];  Service: Urology;  Laterality: N/A;  .  INGUINAL HERNIA REPAIR Left 1965  . LEFT KNEE SURGERY  1940  . MOHS SURGERY  x9  last one 2014   . ORIF LEFT ANKLE FX  1935  . ROTATOR CUFF REPAIR Bilateral right 2013/  left 2010  . TRANSTHORACIC ECHOCARDIOGRAM  06-06-2013   ef 55-60%/  mild AR/  mild dilated aortic root/  mild LAE and RAE/  mild to moderate TR/  small pericardial effusion identified    SOCIAL HISTORY: Social History   Socioeconomic History  . Marital status: Married    Spouse name: Not on file  . Number of children: Not on file  . Years of education: Not on file  . Highest education level: Not on file  Occupational History  . Not on file  Social Needs  . Financial resource strain: Not on file  . Food insecurity:    Worry: Not on file    Inability: Not on file  . Transportation needs:    Medical: Not on file    Non-medical: Not on file  Tobacco Use  . Smoking status:  Never Smoker  . Smokeless tobacco: Never Used  Substance and Sexual Activity  . Alcohol use: Yes    Alcohol/week: 0.6 oz    Types: 1 Glasses of wine per week    Comment: 1 glass of wine per week  . Drug use: No  . Sexual activity: Not on file  Lifestyle  . Physical activity:    Days per week: Not on file    Minutes per session: Not on file  . Stress: Not on file  Relationships  . Social connections:    Talks on phone: Not on file    Gets together: Not on file    Attends religious service: Not on file    Active member of club or organization: Not on file    Attends meetings of clubs or organizations: Not on file    Relationship status: Not on file  Other Topics Concern  . 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 Stroke Father   . Heart disease Father   . Parkinson's disease Mother   . Heart disease Brother   . Melanoma Brother 26       metastatic and secondary cancer  . Heart disease Maternal Uncle   . Cancer Daughter 53       muscle/deep in thigh cancer (myosarcoma? perhaps)  . Cancer Daughter        thyroid  . Cancer - Cervical Daughter        Parotid gland    ALLERGIES:  is allergic to cinnamon; ibuprofen; percocet [oxycodone-acetaminophen]; protonix [pantoprazole sodium]; and xarelto [rivaroxaban].  MEDICATIONS:  Current Outpatient Medications  Medication Sig Dispense Refill  . aspirin EC 325 MG tablet Take 325 mg by mouth daily.     . finasteride (PROSCAR) 5 MG tablet Take 5 mg by mouth every evening. Dose is taken at bedtime    . furosemide (LASIX) 20 MG  tablet TAKE 1 TABLET BY MOUTH EVERY DAY 90 tablet 3  . KLOR-CON 10  10 MEQ tablet TAKE 1 TABLET BY MOUTH EVERY DAY 90 tablet 3  . levothyroxine (SYNTHROID, LEVOTHROID) 100 MCG tablet TAKE 1 TABLET ON AN EMPTY STOMACH IN THE MORNING ONCE A DAY  2  . metoprolol succinate (TOPROL-XL) 25 MG 24 hr tablet TAKE 1/2 TABLET BY MOUTH DAILY. 45 tablet 1  . Multiple Vitamin (MULTIVITAMIN WITH MINERALS) TABS tablet Take 1 tablet by mouth daily.     No current facility-administered medications for this visit.     REVIEW OF SYSTEMS:  Constitutional: Denies fevers, chills or abnormal night sweats (+) good energy Eyes: Denies blurriness of vision, double vision or watery eyes Ears, nose, mouth, throat, and face: Denies mucositis or sore throat Respiratory: Denies cough, wheezes Cardiovascular: Denies palpitation, chest discomfort or lower extremity swelling Gastrointestinal:  Denies nausea, heartburn or change in bowel habits (+) occasional Hematochezia Skin: Denies abnormal skin rashes Lymphatics: Denies new lymphadenopathy or easy bruising Neurological:Denies numbness, tingling or new weaknesses Behavioral/Psych: Mood is stable, no new changes  All other systems were reviewed with the patient and are negative.  PHYSICAL EXAMINATION:  ECOG PERFORMANCE STATUS: 2-3 BP (!) 153/61 (BP Location: Left Arm, Patient Position: Sitting) Comment: Thu RN is aware  Pulse 63   Temp 97.6 F (36.4 C)   Resp 16   Ht 6' (1.829 m)   Wt 191 lb 9.6 oz (86.9 kg)   SpO2 95%   BMI 25.99 kg/m  GENERAL:alert, no distress and comfortable SKIN: skin color, texture, turgor are normal, no rashes or significant lesions except surgical change on right cheek and nose  EYES: normal, conjunctiva are pink and non-injected, sclera clear OROPHARYNX:no exudate, no erythema and lips, buccal mucosa, and tongue normal  NECK: supple, thyroid normal size, non-tender, without nodularity LYMPH:  no palpable lymphadenopathy in the cervical,  axillary or inguinal LUNGS: clear to auscultation and percussion with normal breathing effort HEART: regular rate & rhythm and no murmurs and no lower extremity edema ABDOMEN:abdomen soft, non-tender and normal bowel sounds. No enlarged inguinal lymph nodes.  PSYCH: alert & oriented x 3 with fluent speech.  NEURO: no focal motor/sensory deficits Rectal Exam: normal. No mass that I could appreciate. Internal Hemorids. Normal stool on glove,no blood present.    LABORATORY DATA:  I have reviewed the data as listed  CBC Latest Ref Rng & Units 11/29/2017 07/27/2017 05/13/2017  WBC 4.0 - 10.3 K/uL 3.9(L) 4.5 4.4  Hemoglobin 13.0 - 17.1 g/dL 13.7 14.1 13.7  Hematocrit 38.4 - 49.9 % 41.5 42.0 41.5  Platelets 140 - 400 K/uL 185 190 186   CMP Latest Ref Rng & Units 11/29/2017 07/27/2017 05/13/2017  Glucose 70 - 140 mg/dL 109 144(H) 94  BUN 7 - 26 mg/dL 14 14.1 13.0  Creatinine 0.70 - 1.30 mg/dL 0.89 1.0 1.0  Sodium 136 - 145 mmol/L 139 141 140  Potassium 3.5 - 5.1 mmol/L 4.1 4.1 4.4  Chloride 98 - 109 mmol/L 106 - -  CO2 22 - 29 mmol/L 25 28 27   Calcium 8.4 - 10.4 mg/dL 9.3 9.4 9.8  Total Protein 6.4 - 8.3 g/dL 6.4 6.8 7.0  Total Bilirubin 0.2 - 1.2 mg/dL 0.7 0.56 0.53  Alkaline Phos 40 - 150 U/L 85 97 99  AST 5 - 34 U/L 21 24 26   ALT 0 - 55 U/L 16 21 16     CEA (IN HOUSE-[facility])  Order: [id number]  Status:  Final result Visible to patient:  No (Not Released) Next appt:  01/18/2018 at 09:30 AM in  Cardiology [name], NP)   Ref Range & Units 09:00 32mo ago 107mo ago  CEA ([facility]-In House) 0.00 - 5.00 ng/mL 4.70  4.60 CM 4.28         PATHOLOGY REPORT  Diagnosis 07/16/2015  Rectum, biopsy, mass - INVASIVE ADENOCARCINOMA WITH SIGNET RING CELL FEATURES. - LYMPHOVASCULAR INVASION IS IDENTIFIED. - SEE COMMENT. Microscopic Comment To help evaluate the case, immunohistochemistry stains were performed. The malignant cells are positive for CDX2 and cytokeratin AE1/AE3. There is focal staining  for CD56, a neuroendocrine marker. Prostein and S-100 stains are negative. Overall, the features are consistent with invasive adenocarcinoma of colonic origin, with signet ring cell features. The case was discussed with Dr. Cristina Gong on 07/18/15. (JBK:ds 07/18/15)   PROCEDURES  Flexible  Sigmoidoscopy 07/16/2015 by Dr. Cristina Gong  impression , an infiltrative no obstructing large mass was found in the distal rectum. The mass was partially circumferential (involving one third of the lumen circumference ). The mass measured 5 cm in length.  Biopsy was obtained.  LOWER ENDOSCOPY [place] 08/08/2015  STAGING: T3 N1 Mx by endorectal ultrasound (limited study). ENDOSCOPIC IMPRESSION: Poor prep; unable to do colonoscopy. Rectal mass (adenocarcinoma on biopsies). Staging as above.   RADIOGRAPHIC STUDIES: I have personally reviewed the radiological images as listed and agreed with the findings in the report. No results found.   CT AP W Contrast 07/28/17  IMPRESSION: 1. Residual rectal wall thickening, as before. Mildly enlarged aortocaval lymph node is stable. No additional evidence of metastatic disease. 2. Aortic atherosclerosis (ICD10-170.0). Three-vessel coronary artery calcification. 3. Enlarged prostate. 4. Advanced left hip osteoarthritis.   CT chest abdomen pelvis w contrast 02/07/2016 IMPRESSION: 1. Grossly stable anorectal wall thickening compared with previous study. No adjacent adenopathy or evidence of distant metastatic disease. 2. Multiple bladder calculi are again noted status post Foley catheter placement. In addition, there are multiple calculi within the urethra around the catheter. The prostate gland is moderately enlarged. 3. Moderate stool throughout the colon without evidence of obstruction. 4. Moderate atherosclerosis and severe left hip osteoarthritis again noted.  CT CAP 05/11/2017 IMPRESSION: 1. Newly enlarged 1.3 cm in short axis aortocaval node, while not entirely  specific, is concerning for recurrent malignancy. No other indicators of recurrence identified. Biopsy or nuclear medicine PET-CT could be utilized to provide greater specificity if clinically warranted. 2. Continued abnormal wall thickening in the inferior rectum. Surrounding perirectal and presacral stranding likely from prior radiation therapy. 3. Prostatomegaly, prostate volume 93 cubic cm. 4. Other imaging findings of potential clinical significance: Aortic Atherosclerosis (ICD10-I70.0). Coronary atherosclerosis with mild cardiomegaly. Markedly severe chronic arthropathy in the left hip. Suspected cyst in segment 6 of the liver, unchanged. Prominent stool throughout the colon favors constipation. Direct right inguinal hernia contains adipose tissue. Possible retraction of the left testicle. 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  ASSESSMENT & PLAN:  84 y.o. Caucasian male, with past medical history of ulcerative colitis, hypertension and hypothyroidism, who lives in a independent living, presented with bowel habits change and rectal bleeding.  1. Low rectal adenocarcinoma, cT3N1M0, stage IIIB  -he is s/p concurrent chemotherapy and radiation, tolerated very well overall. His symptoms improved after treatment. -He declined surgery due to his advanced age and long recovery from surgery -I previously discussed that his rectal cancer is unlikely cured by neoadjuvant chemotherapy and radiation alone. He will likely grow again without surgical resection, which can cause obstruction, bleeding and pain. He may need diverging colostomy down the road. Rectal  stent placement may not be an option due to the distal location -flexible sigmoidoscopy by Dr. Alycia Rossetti, per pt no significant residual tumor after chemo RT -I reviewed his CT CAP from 05/11/17 which showed newly enlarged 1.3 cm aortocaval node concerning for recurrence. No other mets -Due to his advanced age, I previously  discussed that I may not offer chemotherapy if he has disease recurrence. We discussed the role of palliative radiation or other systemic therapy, such as immunotherapy, if needed. -His genetic testing was negative.  -We previously discussed his CT Cap from 07/28/17 which shows the aortocaval lymph node has reduced in size slightly since last scan and no evidence of disease recurrence.  -It has been 2 years and 4 months since his diagnosis, the risk of recurrence is much smaller now. -He is clinically doing well,  physical exam and rectal exam was unremarkable. Labs reviewed with patient, CBC, CMP and CEA are within normal limits. No concern for recurrence  -F/u in 6 months   2. Low back pain -secondary to spinal stenosis and rectal pain, It has resolved after he completed chemoradiation  3. HTN, AF, arthritis -He will continue follow-up with his primary care physician -we watched his blood pressure and heart rate to closely during chemoradiation.  4. Anemia -developed after chemoRT -resolved now   5. Skin cancer including melanoma  -He will continue to follow-up with his dermatologist   Plan -Lab and f/u in 6 months -may order last surveilling CT at next visit   All questions were answered. The patient knows to call the clinic with any problems, questions or concerns.  I spent 20 minutes counseling the patient face to face. The total time spent in the appointment was 25  minutes and more than 50% was on counseling.  This document serves as a record of services personally performed by [name], MD. It was created on her behalf by Theresia Bough, a trained medical scribe. The creation of this record is based on the scribe's personal observations and the provider's statements to them.   I have reviewed the above documentation for accuracy and completeness, and I agree with the above.    [name], MD 11/29/2017 4:18 PM

## 2017-11-29 ENCOUNTER — Telehealth: Payer: Self-pay | Admitting: Hematology

## 2017-11-29 ENCOUNTER — Encounter: Payer: Self-pay | Admitting: Hematology

## 2017-11-29 ENCOUNTER — Inpatient Hospital Stay: Payer: Medicare Other | Attending: Hematology | Admitting: Hematology

## 2017-11-29 ENCOUNTER — Inpatient Hospital Stay: Payer: Medicare Other

## 2017-11-29 VITALS — BP 153/61 | HR 63 | Temp 97.6°F | Resp 16 | Ht 72.0 in | Wt 191.6 lb

## 2017-11-29 DIAGNOSIS — C439 Malignant melanoma of skin, unspecified: Secondary | ICD-10-CM | POA: Diagnosis not present

## 2017-11-29 DIAGNOSIS — C20 Malignant neoplasm of rectum: Secondary | ICD-10-CM | POA: Diagnosis not present

## 2017-11-29 DIAGNOSIS — E039 Hypothyroidism, unspecified: Secondary | ICD-10-CM | POA: Diagnosis not present

## 2017-11-29 DIAGNOSIS — I700 Atherosclerosis of aorta: Secondary | ICD-10-CM | POA: Diagnosis not present

## 2017-11-29 DIAGNOSIS — Z923 Personal history of irradiation: Secondary | ICD-10-CM | POA: Insufficient documentation

## 2017-11-29 DIAGNOSIS — I10 Essential (primary) hypertension: Secondary | ICD-10-CM | POA: Diagnosis not present

## 2017-11-29 LAB — COMPREHENSIVE METABOLIC PANEL
ALK PHOS: 85 U/L (ref 40–150)
ALT: 16 U/L (ref 0–55)
ANION GAP: 8 (ref 3–11)
AST: 21 U/L (ref 5–34)
Albumin: 3.5 g/dL (ref 3.5–5.0)
BUN: 14 mg/dL (ref 7–26)
CALCIUM: 9.3 mg/dL (ref 8.4–10.4)
CO2: 25 mmol/L (ref 22–29)
Chloride: 106 mmol/L (ref 98–109)
Creatinine, Ser: 0.89 mg/dL (ref 0.70–1.30)
GFR calc Af Amer: >60 mL/min (ref >60)
GLUCOSE: 109 mg/dL (ref 70–140)
POTASSIUM: 4.1 mmol/L (ref 3.5–5.1)
Sodium: 139 mmol/L (ref 136–145)
TOTAL PROTEIN: 6.4 g/dL (ref 6.4–8.3)
Total Bilirubin: 0.7 mg/dL (ref 0.2–1.2)

## 2017-11-29 LAB — CBC WITH DIFFERENTIAL/PLATELET
Basophils Absolute: 0 10*3/uL (ref 0.0–0.1)
Basophils Relative: 1 %
EOS PCT: 2 %
Eosinophils Absolute: 0.1 10*3/uL (ref 0.0–0.5)
HEMATOCRIT: 41.5 % (ref 38.4–49.9)
Hemoglobin: 13.7 g/dL (ref 13.0–17.1)
LYMPHS PCT: 17 %
Lymphs Abs: 0.6 10*3/uL — ABNORMAL LOW (ref 0.9–3.3)
MCH: 31.7 pg (ref 27.2–33.4)
MCHC: 33.1 g/dL (ref 32.0–36.0)
MCV: 95.8 fL (ref 79.3–98.0)
MONO ABS: 0.5 10*3/uL (ref 0.1–0.9)
MONOS PCT: 14 %
NEUTROS ABS: 2.6 10*3/uL (ref 1.5–6.5)
Neutrophils Relative %: 66 %
PLATELETS: 185 10*3/uL (ref 140–400)
RBC: 4.33 MIL/uL (ref 4.20–5.82)
RDW: 13.8 % (ref 11.0–14.6)
WBC: 3.9 10*3/uL — ABNORMAL LOW (ref 4.0–10.3)

## 2017-11-29 LAB — CEA (IN HOUSE-CHCC): CEA (CHCC-In House): 4.7 ng/mL (ref 0.00–5.00)

## 2017-11-29 NOTE — Telephone Encounter (Signed)
Appointments scheduled AVS/Calendar printed per 3/25 los °

## 2017-12-03 ENCOUNTER — Telehealth: Payer: Self-pay | Admitting: *Deleted

## 2017-12-03 NOTE — Telephone Encounter (Signed)
-----   Message from [name], MD sent at 11/29/2017 11:28 PM EDT ----- Please let pt or his daughter know his lab results, all WNL, no concerns, thanks  [name]  11/29/2017

## 2017-12-03 NOTE — Telephone Encounter (Signed)
Called pt at home and left message on voice mail re:  All lab results were WNL as per Dr. Ernestina Penna instructions.   Left message also that Dr. Ernestina Penna last office notes had been faxed to his PCP Dr. [name].

## 2017-12-04 ENCOUNTER — Other Ambulatory Visit: Payer: Self-pay | Admitting: Nurse Practitioner

## 2017-12-31 ENCOUNTER — Encounter: Payer: Self-pay | Admitting: Nurse Practitioner

## 2018-01-04 DIAGNOSIS — N401 Enlarged prostate with lower urinary tract symptoms: Secondary | ICD-10-CM | POA: Diagnosis not present

## 2018-01-04 DIAGNOSIS — R351 Nocturia: Secondary | ICD-10-CM | POA: Diagnosis not present

## 2018-01-12 DIAGNOSIS — Z85048 Personal history of other malignant neoplasm of rectum, rectosigmoid junction, and anus: Secondary | ICD-10-CM | POA: Diagnosis not present

## 2018-01-12 DIAGNOSIS — K625 Hemorrhage of anus and rectum: Secondary | ICD-10-CM | POA: Diagnosis not present

## 2018-01-18 ENCOUNTER — Ambulatory Visit (INDEPENDENT_AMBULATORY_CARE_PROVIDER_SITE_OTHER): Payer: Medicare Other | Admitting: Nurse Practitioner

## 2018-01-18 ENCOUNTER — Encounter: Payer: Self-pay | Admitting: Nurse Practitioner

## 2018-01-18 VITALS — BP 152/80 | HR 72 | Ht 72.0 in | Wt 195.1 lb

## 2018-01-18 DIAGNOSIS — Z8679 Personal history of other diseases of the circulatory system: Secondary | ICD-10-CM

## 2018-01-18 DIAGNOSIS — I10 Essential (primary) hypertension: Secondary | ICD-10-CM | POA: Diagnosis not present

## 2018-01-18 DIAGNOSIS — I480 Paroxysmal atrial fibrillation: Secondary | ICD-10-CM | POA: Diagnosis not present

## 2018-01-18 NOTE — Progress Notes (Signed)
CARDIOLOGY OFFICE NOTE  Date:  01/18/2018    Nathaniel [name] Date of Birth: 01-04-1920 Medical Record #[id number]  PCP:  [name], MD  Cardiologist:  Servando Snare    Chief Complaint  Patient presents with  . Atrial Fibrillation    6 month check     History of Present Illness: Nathaniel [name] is a 84 y.o. male who presents today for a 6 month check. Seen for Dr. Aundra Dubin. Primarily follows with me.   He has hadacute pericarditis and atrial fibrillation.He was admitted in early 9/14 with pleuritic chest pain. He had diffuse slight ST elevation on ECG and was thought to have acute pericarditis, ?viral pericarditis. No pericardial effusion initially. While in the hospital, he went into atrial fibrillation with RVR and was treated with amiodarone. He converted to NSR andhas remained in NSR.He was started on Xarelto in the hospital. Followup echo later in 9/14 showed the development of a moderate pericardial effusion without tamponade. Xarelto was stopped. Patient had another echo on 06/06/13 that showed a small pericardial effusion (improved).   Hewas thendiagnosed with rectal cancer and underwent chemotherapy and radiation, opted against surgery.   Last seen by Dr. Aundra Dubin back in November of 2017 and was felt to be doing ok from our standpoint.I last saw him back in November of 2017 - he was doing well - remaining quite active. Had been noted to have an abnormal CT scan back in September and was getting a follow up study scheduled.   Comes in today. [place]. He has had recent sigmoidoscopy. This turned out ok. He is doing well. No chest pain. He has stable DOE. No falls. Not dizzy or lightheaded. Has labs with PCP once a year. Also seeing oncology. No real concerns today. No awareness of any AF. BP typically lower at home. He lives out at [employer]. Remains on high dose aspirin - his choice - does not wish to reduce - no problems noted. His swelling is stable -  he continues to use support stockings.     PMH: 1. Hyperlipidemia 2. Hypothyroidism 3. HTN 4. Acute pericarditis: 9/14. Echo (05/11/13) with EF 55-60%, normal RV, no pericardial effusion. Echo (05/22/13) with moderate pericardial effusion, no tamponade. Echo (06/06/13) with small pericardial effusion, no tamponade.  5. Paroxysmal atrial fibrillation: In setting of acute pericarditis when initially noted. Was started on Xarelto but this was stopped when he developed a pericardial effusion.  6. Chronic low back pain 7. Venous insufficiency.  8. Rectal cancer: s/p chemotherapy and radiation. Decided against surgery.  9. Nephrolithiasis.    Past Medical History:  Diagnosis Date  . Anemia due to antineoplastic chemotherapy   . Atherosclerosis of coronary artery cardiologist-  dr Aundra Dubin   aorta and coronary arteries moderate per ct 02-07-2016  . Basal cell carcinoma    40+  . Bladder calculi   . BPH (benign prostatic hyperplasia)   . Calculi, urethra   . Chronic low back pain   . Constipation   . ED (erectile dysfunction)   . Edema of lower extremity   . Family history of melanoma   . Foley catheter in place   . History of adenomatous polyp of colon    2007  . History of basal cell carcinoma excision    multiple  . History of cellulitis    left lower leg-- resolved  . History of melanoma excision    temple right side/ multiple  . History of obstructive sleep apnea  per pt cured by hypnosis  . History of pericarditis    acute episode 09/ 2014  per cardiologist note , dr Aundra Dubin , related possibly to xarelto use  . History of ulcerative colitis    remote  . HTN (hypertension)   . Hypercholesteremia   . Hypothyroidism   . Incomplete right bundle branch block   . LAFB (left anterior fascicular block)   . Melanoma ([place])   . OA (osteoarthritis)   . PAF (paroxysmal atrial fibrillation) [facility]) cardiologist-  dr dalton Aundra Dubin   dx 09/ 2014  in setting of acute pericarditis     . Rectal cancer [facility]) dx 07-16-2015  oncologist-  dr [name]   Stage IIIB (cT3, N1, M0)-- concurrent  chemotherapy started 08-12-2015/  radiation complete (08-12-2015 to 09-20-2015)  . S/P radiation therapy 08/12/15-09/20/15   rectal ca50.4Gy total dose  . Spinal stenosis     Past Surgical History:  Procedure Laterality Date  . CATARACT EXTRACTION W/ INTRAOCULAR LENS IMPLANT Bilateral 10/ 2016  . CYSTOSCOPY WITH LITHOLAPAXY N/A 02/12/2016   Procedure: CYSTOSCOPY WITH LITHOLAPAXY;  Surgeon: Alexis Frock, MD;  Location: [facility];  Service: Urology;  Laterality: N/A;  . EUS N/A 08/08/2015   Procedure: LOWER ENDOSCOPIC ULTRASOUND (EUS);  Surgeon: Arta Silence, MD;  Location: [facility] ENDOSCOPY;  Service: Endoscopy;  Laterality: N/A;  . FLEXIBLE SIGMOIDOSCOPY N/A 08/08/2015   Procedure: FLEXIBLE SIGMOIDOSCOPY;  Surgeon: Arta Silence, MD;  Location: [facility] ENDOSCOPY;  Service: Endoscopy;  Laterality: N/A;  poor prep  . HOLMIUM LASER APPLICATION N/A 11/13/7562   Procedure: HOLMIUM LASER APPLICATION;  Surgeon: Alexis Frock, MD;  Location: [facility];  Service: Urology;  Laterality: N/A;  . INGUINAL HERNIA REPAIR Left 1965  . LEFT KNEE SURGERY  1940  . MOHS SURGERY  x9  last one 2014   . ORIF LEFT ANKLE FX  1935  . ROTATOR CUFF REPAIR Bilateral right 2013/  left 2010  . TRANSTHORACIC ECHOCARDIOGRAM  06-06-2013   ef 55-60%/  mild AR/  mild dilated aortic root/  mild LAE and RAE/  mild to moderate TR/  small pericardial effusion identified     Medications: Current Meds  Medication Sig  . aspirin EC 325 MG tablet Take 325 mg by mouth daily.   . finasteride (PROSCAR) 5 MG tablet Take 5 mg by mouth every evening. Dose is taken at bedtime  . furosemide (LASIX) 20 MG tablet TAKE 1 TABLET BY MOUTH EVERY DAY  . KLOR-CON 10 10 MEQ tablet TAKE 1 TABLET BY MOUTH EVERY DAY  . levothyroxine (SYNTHROID, LEVOTHROID) 100 MCG tablet TAKE 1 TABLET ON AN EMPTY STOMACH IN THE MORNING  ONCE A DAY  . metoprolol succinate (TOPROL-XL) 25 MG 24 hr tablet TAKE 1/2 TABLET BY MOUTH DAILY.  . Multiple Vitamin (MULTIVITAMIN WITH MINERALS) TABS tablet Take 1 tablet by mouth daily.     Allergies: Allergies  Allergen Reactions  . Cinnamon Itching  . Ibuprofen Diarrhea  . Percocet [Oxycodone-Acetaminophen] Other (See Comments)    confusion  . Protonix [Pantoprazole Sodium] Diarrhea and Nausea And Vomiting  . Xarelto [Rivaroxaban] Other (See Comments)    Diarrhea, dehydration    Social History: The patient  reports that he has never smoked. He has never used smokeless tobacco. He reports that he drinks about 0.6 oz of alcohol per week. He reports that he does not use drugs.   Family History: The patient's family history includes Cancer in his daughter; Cancer (age of onset: 36) in  his daughter; Cancer - Cervical in his daughter; Heart disease in his brother, father, and maternal uncle; Melanoma (age of onset: 38) in his brother; Parkinson's disease in his mother; Stroke in his father.   Review of Systems: Please see the history of present illness.   Otherwise, the review of systems is positive for none.   All other systems are reviewed and negative.   Physical Exam: VS:  BP (!) 152/80 (BP Location: Left Arm, Patient Position: Sitting, Cuff Size: Normal)   Pulse 72   Ht 6' (1.829 m)   Wt 195 lb 1.9 oz (88.5 kg)   SpO2 98% Comment: at rest  BMI 26.46 kg/m  .  BMI Body mass index is 26.46 kg/m.  Wt Readings from Last 3 Encounters:  01/18/18 195 lb 1.9 oz (88.5 kg)  11/29/17 191 lb 9.6 oz (86.9 kg)  08/02/17 199 lb 6.4 oz (90.4 kg)    General: Pleasant. Elderly male. Looks younger than his stated age. Alert and in no acute distress.   HEENT: Normal.  Neck: Supple, no JVD, carotid bruits, or masses noted.  Cardiac: Regular rate and rhythm. No murmurs, rubs, or gallops. No edema.  Respiratory:  Lungs are clear to auscultation bilaterally with normal work of breathing.    GI: Soft and nontender.  MS: No deformity or atrophy. Gait and ROM intact. Using a walker.  Skin: Warm and dry. Color is normal.  Neuro:  Strength and sensation are intact and no gross focal deficits noted.  Psych: Alert, appropriate and with normal affect.   LABORATORY DATA:  EKG:  EKG is not ordered today.  Lab Results  Component Value Date   WBC 3.9 (L) 11/29/2017   HGB 13.7 11/29/2017   HCT 41.5 11/29/2017   PLT 185 11/29/2017   GLUCOSE 109 11/29/2017   ALT 16 11/29/2017   AST 21 11/29/2017   NA 139 11/29/2017   K 4.1 11/29/2017   CL 106 11/29/2017   CREATININE 0.89 11/29/2017   BUN 14 11/29/2017   CO2 25 11/29/2017   TSH 1.49 09/14/2013     BNP (last 3 results) No results for input(s): BNP in the last 8760 hours.  ProBNP (last 3 results) No results for input(s): PROBNP in the last 8760 hours.   Other Studies Reviewed Today:  EchoStudy Conclusionsfrom 08/2016  - Left ventricle: The cavity size was normal. There was moderate concentric hypertrophy. Systolic function was normal. The estimated ejection fraction was in the range of 60% to 65%. Wall motion was normal; there were no regional wall motion abnormalities. Doppler parameters are consistent with abnormal left ventricular relaxation (grade 1 diastolic dysfunction). Doppler parameters are consistent with indeterminate ventricular filling pressure. - Aortic valve: Transvalvular velocity was within the normal range. There was no stenosis. There was mild regurgitation. - Aorta: Ascending aortic diameter: 35 mm (S). - Mitral valve: Transvalvular velocity was within the normal range. There was no evidence for stenosis. There was no regurgitation. - Right ventricle: The cavity size was mildly dilated. Wall thickness was normal. Systolic function was normal. - Right atrium: The atrium was mildly dilated. - Atrial septum: No defect or patent foramen ovale was identified by color flow  Doppler. - Tricuspid valve: There was mild regurgitation. - Pulmonary arteries: PA peak pressure: 38 mm Hg (S).  Assessment/Plan:  1. History of pericarditis - dates back to 2014 - no recurrence.   2. PAF - one lone episode in the setting of acute pericarditis - no longer on Xarelto  due to possibility that it was associated with pericardial effusion.CHADSVASC is at least a 3.He has been on high dose aspirin since this timedue to the reaction from Xarelto - he wishes to continue and not reduce the dose. No problems noted.   3. Prior rectal bleeding/rectal cancer-Just had sigmoidoscopy which turned out ok.   4. Advanced age- he continues to do quite well.   5. HTN - BP typically better at home. No changes made today.   6. Chronic edema - managed with low dose lasix and potassium - no changes made today.   Current medicines are reviewed with the patient today.  The patient does not have concerns regarding medicines other than what has been noted above.  The following changes have been made:  See above.  Labs/ tests ordered today include:   No orders of the defined types were placed in this encounter.    Disposition:   FU with me in 6 months.   Patient is agreeable to this plan and will call if any problems develop in the interim.   SignedTruitt Merle, NP  01/18/2018 9:53 AM  [facility] [address] [place], [place]  [address] Phone: [phone] Fax: [phone]

## 2018-01-18 NOTE — Patient Instructions (Addendum)
We will be checking the following labs today - NONE   Medication Instructions:    Continue with your current medicines.     Testing/Procedures To Be Arranged:  N/A  Follow-Up:   See me in 6 months.    Other Special Instructions:   N/A    If you need a refill on your cardiac medications before your next appointment, please call your pharmacy.   Call the [facility] office at [phone] if you have any questions, problems or concerns.      

## 2018-04-25 DIAGNOSIS — Z961 Presence of intraocular lens: Secondary | ICD-10-CM | POA: Diagnosis not present

## 2018-04-25 DIAGNOSIS — H52203 Unspecified astigmatism, bilateral: Secondary | ICD-10-CM | POA: Diagnosis not present

## 2018-04-25 DIAGNOSIS — H35073 Retinal telangiectasis, bilateral: Secondary | ICD-10-CM | POA: Diagnosis not present

## 2018-05-10 DIAGNOSIS — Z85828 Personal history of other malignant neoplasm of skin: Secondary | ICD-10-CM | POA: Diagnosis not present

## 2018-05-10 DIAGNOSIS — Z86018 Personal history of other benign neoplasm: Secondary | ICD-10-CM | POA: Diagnosis not present

## 2018-05-10 DIAGNOSIS — L821 Other seborrheic keratosis: Secondary | ICD-10-CM | POA: Diagnosis not present

## 2018-05-10 DIAGNOSIS — Z85820 Personal history of malignant melanoma of skin: Secondary | ICD-10-CM | POA: Diagnosis not present

## 2018-05-10 DIAGNOSIS — L814 Other melanin hyperpigmentation: Secondary | ICD-10-CM | POA: Diagnosis not present

## 2018-05-10 DIAGNOSIS — L570 Actinic keratosis: Secondary | ICD-10-CM | POA: Diagnosis not present

## 2018-05-10 DIAGNOSIS — D1801 Hemangioma of skin and subcutaneous tissue: Secondary | ICD-10-CM | POA: Diagnosis not present

## 2018-05-10 DIAGNOSIS — D225 Melanocytic nevi of trunk: Secondary | ICD-10-CM | POA: Diagnosis not present

## 2018-05-31 NOTE — Progress Notes (Signed)
[place]  Telephone:([phone] Fax:(336) [phone]  Clinic follow up Note   Patient Care Team: [name], MD as PCP - General (Family Medicine) Ronald Lobo, MD as Consulting Physician (Gastroenterology) Tania Ade, RN as Registered Nurse 06/02/18  CHIEF COMPLAINTS:  Follow up rectal cancer  Oncology History    Rectal cancer [facility])   Staging form: Colon and Rectum, AJCC 7th Edition     Clinical stage from 08/08/2015: Stage IIIB (T3, N1, M0) - Unsigned       Rectal cancer ([place])   07/16/2015 Pathology Results    Biopsy positive for invasive adenocarcinoma with signet ring cell features    07/23/2015 Initial Diagnosis    Rectal cancer ([place])    08/12/2015 Concurrent Chemotherapy    Xeloda 1500mg  q12h on the day of radiation     08/12/2015 - 09/20/2015 Radiation Therapy    radiaiton to rectal cancer     02/18/2016 Procedure    FLEX SIGMOIDOSCOPY: Endoscopic disappearance of previous rectal mass; atrophic mucosa in distal rectum  per Dr. Cristina Gong    02/18/2016 Pathology Results    02/18/2016 Surgical Pathology Diagnosis Surgical [P], rectum - MILD GLANDULAR ARCHITECTURAL CHANGES AND REPARATIVE FEATURES. - NO ADENOMATOUS CHANGE OR MALIGNANCY IDENTIFIED.    02/20/2016 Pathology Results    No adenomatous change or malignancy    05/11/2017 Imaging    CT CAP IMPRESSION: 1. Newly enlarged 1.3 cm in short axis aortocaval node, while not entirely specific, is concerning for recurrent malignancy. No other indicators of recurrence identified. Biopsy or nuclear medicine PET-CT could be utilized to provide greater specificity if clinically warranted. 2. Continued abnormal wall thickening in the inferior rectum. Surrounding perirectal and presacral stranding likely from prior radiation therapy. 3. Prostatomegaly, prostate volume 93 cubic cm. 4. Other imaging findings of potential clinical significance: Aortic Atherosclerosis (ICD10-I70.0). Coronary  atherosclerosis with mild cardiomegaly. Markedly severe chronic arthropathy in the left hip. Suspected cyst in segment 6 of the liver, unchanged. Prominent stool throughout the colon favors constipation. Direct right inguinal hernia contains adipose tissue. Possible retraction of the left testicle. 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    07/28/2017 Imaging    CT AP W Contrast 07/28/17  IMPRESSION: 1. Residual rectal wall thickening, as before. Mildly enlarged aortocaval lymph node is stable. No additional evidence of metastatic disease. 2. Aortic atherosclerosis (ICD10-170.0). Three-vessel coronary artery calcification. 3. Enlarged prostate. 4. Advanced left hip osteoarthritis.    01/12/2018 Procedure     01/12/2018 Sigmoidoscopy Erythematous and febrile mucosa in the mid rectum consistent with prior chemoradiation. No evidence or local recurrence.       HISTORY OF PRESENTING ILLNESS:  Nathaniel [name] 84 y.o. male is here because of recently diagnosed rectal cancer. He presents to the clinic by himself.  He has had abnormal bowel movement for the past 2-3 months. It's usually very small amount, loose, every time he urinates, he has a such a small bowel movement. He does not really have a good normal bowel movement. He also reports frequent stool stains on his underwear. He noticed small amount blood in his stool recently, which prompt his seeing GI Dr. Cristina Gong on. He underwent a flexible sigmoidoscopy in the office, which showed a rectal mass, biopsy reviewed adenocarcinoma. He denies significant rectal pain, abdominal discomfort, or other new complaints.  He lives in a independent living facility, he is able to take care of his all ADLs, and does some light housework, shopping, by himself. He  uses a walker, still drives. He does get fatigued after activity, his energy level has seemed to be decreasing in over the past few years. No significant change daily. He has  decent appetite and eats well.  He had remote history of ulcerative colitis, which has been in remission. 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  He is widowed, has 6 children, who all live in out states. He does have several stepchildren from his secondary to, and some of them live in [place].  CURRENT TREATMENT: Observation  INTERIM HISTORY  FINNIS COLEE returns for follow-up. I last saw him almost 6 months ago. He also saw Cardiology NP Servando Snare in May to follow-up on pericarditis and A-fib.  Today, he is here alone. He is feeling good and denies new complaints. His BMs are good and back to his normal regular habits. He still sees blood every once in a while.  He is capable of carrying out daily activities normally and states that he is independent.   MEDICAL HISTORY:  Past Medical History:  Diagnosis Date  . Anemia due to antineoplastic chemotherapy   . Atherosclerosis of coronary artery cardiologist-  dr Aundra Dubin   aorta and coronary arteries moderate per ct 02-07-2016  . Basal cell carcinoma    40+  . Bladder calculi   . BPH (benign prostatic hyperplasia)   . Calculi, urethra   . Chronic low back pain   . Constipation   . ED (erectile dysfunction)   . Edema of lower extremity   . Family history of melanoma   . Foley catheter in place   . History of adenomatous polyp of colon    2007  . History of basal cell carcinoma excision    multiple  . History of cellulitis    left lower leg-- resolved  . History of melanoma excision    temple right side/ multiple  . History of obstructive sleep apnea    per pt cured by hypnosis  . History of pericarditis    acute episode 09/ 2014  per cardiologist note , dr Aundra Dubin , related possibly to xarelto use  . History of ulcerative colitis    remote  . HTN (hypertension)   . Hypercholesteremia   . Hypothyroidism   . Incomplete right bundle branch block   . LAFB (left  anterior fascicular block)   . Melanoma ([place])   . OA (osteoarthritis)   . PAF (paroxysmal atrial fibrillation) [facility]) cardiologist-  dr dalton Aundra Dubin   dx 09/ 2014  in setting of acute pericarditis   . Rectal cancer [facility]) dx 07-16-2015  oncologist-  dr [name]   Stage IIIB (cT3, N1, M0)-- concurrent  chemotherapy started 08-12-2015/  radiation complete (08-12-2015 to 09-20-2015)  . S/P radiation therapy 08/12/15-09/20/15   rectal ca50.4Gy total dose  . Spinal stenosis     SURGICAL HISTORY: Past Surgical History:  Procedure Laterality Date  . CATARACT EXTRACTION W/ INTRAOCULAR LENS IMPLANT Bilateral 10/ 2016  . CYSTOSCOPY WITH LITHOLAPAXY N/A 02/12/2016   Procedure: CYSTOSCOPY WITH LITHOLAPAXY;  Surgeon: Alexis Frock, MD;  Location[facility];  Service: Urology;  Laterality: N/A;  . EUS N/A 08/08/2015   Procedure: LOWER ENDOSCOPIC ULTRASOUND (EUS);  Surgeon: Arta Silence, MD;  Location: [facility] ENDOSCOPY;  Service: Endoscopy;  Laterality: N/A;  . FLEXIBLE SIGMOIDOSCOPY N/A 08/08/2015   Procedure: FLEXIBLE SIGMOIDOSCOPY;  Surgeon: Arta Silence, MD;  Location: [facility] ENDOSCOPY;  Service: Endoscopy;  Laterality: N/A;  poor prep  . HOLMIUM LASER APPLICATION N/A 02/13/1156   Procedure: HOLMIUM LASER APPLICATION;  Surgeon: Alexis Frock, MD;  Location[facility];  Service: Urology;  Laterality: N/A;  . INGUINAL HERNIA REPAIR Left 1965  . LEFT KNEE SURGERY  1940  . MOHS SURGERY  x9  last one 2014   . ORIF LEFT ANKLE FX  1935  . ROTATOR CUFF REPAIR Bilateral right 2013/  left 2010  . TRANSTHORACIC ECHOCARDIOGRAM  06-06-2013   ef 55-60%/  mild AR/  mild dilated aortic root/  mild LAE and RAE/  mild to moderate TR/  small pericardial effusion identified    SOCIAL HISTORY: Social History   Socioeconomic History  . Marital status: Married    Spouse name: Not on file  . Number of children: Not on file  . Years of education: Not on file  . Highest education level:  Not on file  Occupational History  . Not on file  Social Needs  . Financial resource strain: Not on file  . Food insecurity:    Worry: Not on file    Inability: Not on file  . Transportation needs:    Medical: Not on file    Non-medical: Not on file  Tobacco Use  . Smoking status: Never Smoker  . Smokeless tobacco: Never Used  Substance and Sexual Activity  . Alcohol use: Yes    Alcohol/week: 1.0 standard drinks    Types: 1 Glasses of wine per week    Comment: 1 glass of wine per week  . Drug use: No  . Sexual activity: Not on file  Lifestyle  . Physical activity:    Days per week: Not on file    Minutes per session: Not on file  . Stress: Not on file  Relationships  . Social connections:    Talks on phone: Not on file    Gets together: Not on file    Attends religious service: Not on file    Active member of club or organization: Not on file    Attends meetings of clubs or organizations: Not on file    Relationship status: Not on file  Other Topics Concern  . 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 Stroke Father   . Heart disease Father   . Parkinson's disease Mother   . Heart disease Brother   . Melanoma Brother 38       metastatic and secondary cancer  . Heart disease Maternal Uncle   . Cancer Daughter 34       muscle/deep in thigh cancer (myosarcoma? perhaps)  . Cancer Daughter        thyroid  . Cancer - Cervical Daughter        Parotid gland    ALLERGIES:  is allergic to cinnamon; ibuprofen; percocet [oxycodone-acetaminophen]; protonix [pantoprazole  sodium]; and xarelto [rivaroxaban].  MEDICATIONS:  Current Outpatient Medications  Medication Sig Dispense Refill  . aspirin EC 325 MG tablet Take 325 mg by mouth daily.     . finasteride (PROSCAR) 5 MG tablet  Take 5 mg by mouth every evening. Dose is taken at bedtime    . furosemide (LASIX) 20 MG tablet TAKE 1 TABLET BY MOUTH EVERY DAY 90 tablet 3  . KLOR-CON 10 10 MEQ tablet TAKE 1 TABLET BY MOUTH EVERY DAY 90 tablet 3  . levothyroxine (SYNTHROID, LEVOTHROID) 100 MCG tablet TAKE 1 TABLET ON AN EMPTY STOMACH IN THE MORNING ONCE A DAY  2  . metoprolol succinate (TOPROL-XL) 25 MG 24 hr tablet TAKE 1/2 TABLET BY MOUTH DAILY. 45 tablet 1  . Multiple Vitamin (MULTIVITAMIN WITH MINERALS) TABS tablet Take 1 tablet by mouth daily.     No current facility-administered medications for this visit.     REVIEW OF SYSTEMS:  Constitutional: Denies fevers, chills or abnormal night sweats Eyes: Denies blurriness of vision, double vision or watery eyes Ears, nose, mouth, throat, and face: Denies mucositis or sore throat Respiratory: Denies cough, wheezes Cardiovascular: Denies palpitation, chest discomfort or lower extremity swelling Gastrointestinal:  Denies nausea, heartburn or change in bowel habits (+) occasional Hematochezia  Skin: Denies abnormal skin rashes Lymphatics: Denies new lymphadenopathy or easy bruising Neurological:Denies numbness, tingling or new weaknesses Behavioral/Psych: Mood is stable, no new changes  All other systems were reviewed with the patient and are negative.  PHYSICAL EXAMINATION:  ECOG PERFORMANCE STATUS: 2-3 BP (!) 149/69 (BP Location: Left Arm, Patient Position: Sitting) Comment: Engineering geologist is aware  Pulse 67   Temp 97.6 F (36.4 C) (Oral)   Resp 17   Ht 6' (1.829 m)   Wt 199 lb 4.8 oz (90.4 kg)   SpO2 98%   BMI 27.03 kg/m  GENERAL:alert, no distress and comfortable (+) walker SKIN: skin color, texture, turgor are normal, no rashes or significant lesions  except surgical change on right cheek and nose  EYES: normal, conjunctiva are pink and non-injected, sclera clear OROPHARYNX:no exudate, no erythema and lips, buccal mucosa, and tongue normal  NECK: supple, thyroid normal size, non-tender, without nodularity LYMPH:  no palpable lymphadenopathy in the cervical, axillary or inguinal LUNGS: clear to auscultation and percussion with normal breathing effort HEART: regular rate & rhythm and no lower extremity edema (+) systolic heart murmur ABDOMEN:abdomen soft, non-tender and normal bowel sounds. No enlarged inguinal lymph nodes.  PSYCH: alert & oriented x 3 with fluent speech.  NEURO: no focal motor/sensory deficits Rectal Exam: normal. No mass that I could appreciate. Normal stool on glove,no blood present. Exam was limited due to difficult positioning.    LABORATORY DATA:  I have reviewed the data as listed  CBC Latest Ref Rng & Units 06/02/2018 11/29/2017 07/27/2017  WBC 4.0 - 10.3 K/uL 4.3 3.9(L) 4.5  Hemoglobin 13.0 - 17.1 g/dL 13.4 13.7 14.1  Hematocrit 38.4 - 49.9 % 39.5 41.5 42.0  Platelets 140 - 400 K/uL 201 185 190   CMP Latest Ref Rng & Units 06/02/2018 11/29/2017 07/27/2017  Glucose 70 - 99 mg/dL 141(H) 109 144(H)  BUN 8 - 23 mg/dL 13 14 14.1  Creatinine 0.61 - 1.24 mg/dL 0.84 0.89 1.0  Sodium 135 - 145 mmol/L 140 139 141  Potassium 3.5 - 5.1 mmol/L 4.2 4.1 4.1  Chloride 98 - 111 mmol/L 104 106 -  CO2 22 - 32 mmol/L 26 25 28   Calcium 8.9 - 10.3 mg/dL 9.3 9.3 9.4  Total Protein 6.5 - 8.1 g/dL 6.7 6.4 6.8  Total Bilirubin 0.3 - 1.2 mg/dL 0.4 0.7 0.56  Alkaline Phos 38 - 126 U/L 100 85 97  AST 15 - 41  U/L 20 21 24   ALT 0 - 44 U/L 17 16 21     CEA (IN HOUSE-[facility])  Results for CHRISTORPHER, HISAW (MRN [id number]) as of 05/31/2018 10:23  Ref. Range 06/11/2016 09:43 10/14/2016 11:33 02/10/2017 09:06 05/13/2017 12:39 11/29/2017 09:00  CEA Latest Ref Range: 0.0 - 4.7 ng/mL 3.4 5.3 (H)     CEA ([facility]-In House) Latest Ref Range: 0.00 - 5.00  ng/mL 3.44 5.90 (H) 4.28 4.60 4.70     PATHOLOGY REPORT   02/18/2016 Surgical Pathology Diagnosis Surgical [P], rectum - MILD GLANDULAR ARCHITECTURAL CHANGES AND REPARATIVE FEATURES. - NO ADENOMATOUS CHANGE OR MALIGNANCY IDENTIFIED.al Information Specimen Comment HX of rectal CA-status post chemo/rad no visible tumor Specimen(s) Obtained: Surgical [P], rectum Gross Received in formalin is a tan, soft tissue fragment that is submitted in toto. Size: 0.1 cm = 1 sec, 1 block submitted. (BB)  Diagnosis 07/16/2015  Rectum, biopsy, mass - INVASIVE ADENOCARCINOMA WITH SIGNET RING CELL FEATURES. - LYMPHOVASCULAR INVASION IS IDENTIFIED. - SEE COMMENT. Microscopic Comment To help evaluate the case, immunohistochemistry stains were performed. The malignant cells are positive for CDX2 and cytokeratin AE1/AE3. There is focal staining for CD56, a neuroendocrine marker. Prostein and S-100 stains are negative. Overall, the features are consistent with invasive adenocarcinoma of colonic origin, with signet ring cell features. The case was discussed with Dr. Cristina Gong on 07/18/15. (JBK:ds 07/18/15)   PROCEDURES  01/12/2018 Sigmoidoscopy     Flexible  Sigmoidoscopy 07/16/2015 by Dr. Cristina Gong  impression , an infiltrative no obstructing large mass was found in the distal rectum. The mass was partially circumferential (involving one third of the lumen circumference ). The mass measured 5 cm in length.  Biopsy was obtained.  LOWER ENDOSCOPY [place] 08/08/2015  STAGING: T3 N1 Mx by endorectal ultrasound (limited study). ENDOSCOPIC IMPRESSION: Poor prep; unable to do colonoscopy. Rectal mass (adenocarcinoma on biopsies). Staging as above.   RADIOGRAPHIC STUDIES: I have personally reviewed the radiological images as listed and agreed with the findings in the report. No results found.     CT AP W Contrast 07/28/17  IMPRESSION: 1. Residual rectal wall thickening, as before. Mildly  enlarged aortocaval lymph node is stable. No additional evidence of metastatic disease. 2. Aortic atherosclerosis (ICD10-170.0). Three-vessel coronary artery calcification. 3. Enlarged prostate. 4. Advanced left hip osteoarthritis.   CT chest abdomen pelvis w contrast 02/07/2016 IMPRESSION: 1. Grossly stable anorectal wall thickening compared with previous study. No adjacent adenopathy or evidence of distant metastatic disease. 2. Multiple bladder calculi are again noted status post Foley catheter placement. In addition, there are multiple calculi within the urethra around the catheter. The prostate gland is moderately enlarged. 3. Moderate stool throughout the colon without evidence of obstruction. 4. Moderate atherosclerosis and severe left hip osteoarthritis again noted.  CT CAP 05/11/2017 IMPRESSION: 1. Newly enlarged 1.3 cm in short axis aortocaval node, while not entirely specific, is concerning for recurrent malignancy. No other indicators of recurrence identified. Biopsy or nuclear medicine PET-CT could be utilized to provide greater specificity if clinically warranted. 2. Continued abnormal wall thickening in the inferior rectum. Surrounding perirectal and presacral stranding likely from prior radiation therapy. 3. Prostatomegaly, prostate volume 93 cubic cm. 4. Other imaging findings of potential clinical significance: Aortic Atherosclerosis (ICD10-I70.0). Coronary atherosclerosis with mild cardiomegaly. Markedly severe chronic arthropathy in the left hip. Suspected cyst in segment 6 of the liver, unchanged. Prominent stool throughout the colon favors constipation. Direct right inguinal hernia contains adipose tissue. Possible retraction of the left testicle. Small focal chronic  dissection in the right common iliac artery.  ASSESSMENT & PLAN:  84 y.o. Caucasian male, with past medical history of ulcerative colitis, hypertension and hypothyroidism, who lives in a independent living,  presented with bowel habits change and rectal bleeding.  1. Low rectal adenocarcinoma, cT3N1M0, stage IIIB  -he is s/p concurrent chemotherapy and radiation, tolerated very well overall. His symptoms improved after treatment. -He declined surgery due to his advanced age and long recovery from surgery -flexible sigmoidoscopy by Dr. Alycia Rossetti in 01/2018 showed no significant residual tumor after chemo RT -I reviewed his CT CAP from 05/11/17 which showed newly enlarged 1.3 cm aortocaval node concerning for recurrence. No other mets.  Repeated CT scan in November 2018 showed a stable portacaval lymph nodes 1.2cm, benign, no other evidence of metastasis. -Due to his advanced age, I previously discussed that I may not offer chemotherapy if he has disease recurrence. We discussed the role of palliative radiation or other systemic therapy, such as immunotherapy, if needed. -His genetic testing was negative.  -Clinically doing very well, asymptomatic, bowel movement has been back to normal.  It has been 3 years since his diagnosis, the risk of recurrence is much smaller now. -He is clinically doing well,  physical exam and rectal exam was unremarkable. Labs reviewed with patient, CBC is WNLs, CMP pending. No concern for recurrence  -I do not plan to repeat surveillance CT scans, we will scan him as needed. -F/u in 6 months   2. Low back pain -secondary to spinal stenosis and rectal pain, It has resolved after he completed chemoradiation  3. HTN, AF, arthritis -He will continue follow-up with his primary care physician -we watched his blood pressure and heart rate to closely during chemoradiation.  4. History of skin cancer including melanoma  -He will continue to follow-up with his dermatologist   Plan -Lab and f/u in 6 months   All questions were answered. The patient knows to call the clinic with any problems, questions or concerns.  I spent 20 minutes counseling the patient face to face. The total  time spent in the appointment was 25  minutes and more than 50% was on counseling.  Dierdre Searles Dweik am acting as scribe for Dr. [name].  I have reviewed the above documentation for accuracy and completeness, and I agree with the above.     [name], MD 06/02/2018

## 2018-06-01 ENCOUNTER — Ambulatory Visit: Payer: Medicare Other | Admitting: Hematology

## 2018-06-01 ENCOUNTER — Other Ambulatory Visit: Payer: Medicare Other

## 2018-06-02 ENCOUNTER — Telehealth: Payer: Self-pay | Admitting: Hematology

## 2018-06-02 ENCOUNTER — Encounter: Payer: Self-pay | Admitting: Hematology

## 2018-06-02 ENCOUNTER — Inpatient Hospital Stay (HOSPITAL_BASED_OUTPATIENT_CLINIC_OR_DEPARTMENT_OTHER): Payer: Medicare Other | Admitting: Hematology

## 2018-06-02 ENCOUNTER — Inpatient Hospital Stay: Payer: Medicare Other | Attending: Hematology

## 2018-06-02 VITALS — BP 149/69 | HR 67 | Temp 97.6°F | Resp 17 | Ht 72.0 in | Wt 199.3 lb

## 2018-06-02 DIAGNOSIS — Z923 Personal history of irradiation: Secondary | ICD-10-CM | POA: Insufficient documentation

## 2018-06-02 DIAGNOSIS — I2510 Atherosclerotic heart disease of native coronary artery without angina pectoris: Secondary | ICD-10-CM | POA: Insufficient documentation

## 2018-06-02 DIAGNOSIS — C20 Malignant neoplasm of rectum: Secondary | ICD-10-CM

## 2018-06-02 DIAGNOSIS — T451X5A Adverse effect of antineoplastic and immunosuppressive drugs, initial encounter: Secondary | ICD-10-CM

## 2018-06-02 DIAGNOSIS — I480 Paroxysmal atrial fibrillation: Secondary | ICD-10-CM | POA: Diagnosis not present

## 2018-06-02 DIAGNOSIS — E039 Hypothyroidism, unspecified: Secondary | ICD-10-CM | POA: Insufficient documentation

## 2018-06-02 DIAGNOSIS — Z9221 Personal history of antineoplastic chemotherapy: Secondary | ICD-10-CM | POA: Insufficient documentation

## 2018-06-02 DIAGNOSIS — I10 Essential (primary) hypertension: Secondary | ICD-10-CM | POA: Diagnosis not present

## 2018-06-02 DIAGNOSIS — Z85828 Personal history of other malignant neoplasm of skin: Secondary | ICD-10-CM | POA: Diagnosis not present

## 2018-06-02 DIAGNOSIS — Z79899 Other long term (current) drug therapy: Secondary | ICD-10-CM | POA: Diagnosis not present

## 2018-06-02 DIAGNOSIS — D6481 Anemia due to antineoplastic chemotherapy: Secondary | ICD-10-CM

## 2018-06-02 DIAGNOSIS — M545 Low back pain: Secondary | ICD-10-CM | POA: Diagnosis not present

## 2018-06-02 LAB — CBC WITH DIFFERENTIAL/PLATELET
BASOS PCT: 1 %
Basophils Absolute: 0 10*3/uL (ref 0.0–0.1)
EOS ABS: 0.1 10*3/uL (ref 0.0–0.5)
Eosinophils Relative: 2 %
HCT: 39.5 % (ref 38.4–49.9)
HEMOGLOBIN: 13.4 g/dL (ref 13.0–17.1)
Lymphocytes Relative: 15 %
Lymphs Abs: 0.6 10*3/uL — ABNORMAL LOW (ref 0.9–3.3)
MCH: 32.3 pg (ref 27.2–33.4)
MCHC: 34 g/dL (ref 32.0–36.0)
MCV: 95.3 fL (ref 79.3–98.0)
Monocytes Absolute: 0.6 10*3/uL (ref 0.1–0.9)
Monocytes Relative: 15 %
NEUTROS PCT: 67 %
Neutro Abs: 2.9 10*3/uL (ref 1.5–6.5)
Platelets: 201 10*3/uL (ref 140–400)
RBC: 4.15 MIL/uL — AB (ref 4.20–5.82)
RDW: 13.8 % (ref 11.0–14.6)
WBC: 4.3 10*3/uL (ref 4.0–10.3)

## 2018-06-02 LAB — CMP (CANCER CENTER ONLY)
ALBUMIN: 3.6 g/dL (ref 3.5–5.0)
ALK PHOS: 100 U/L (ref 38–126)
ALT: 17 U/L (ref 0–44)
AST: 20 U/L (ref 15–41)
Anion gap: 10 (ref 5–15)
BUN: 13 mg/dL (ref 8–23)
CALCIUM: 9.3 mg/dL (ref 8.9–10.3)
CO2: 26 mmol/L (ref 22–32)
CREATININE: 0.84 mg/dL (ref 0.61–1.24)
Chloride: 104 mmol/L (ref 98–111)
GFR, Est AFR Am: >60 mL/min (ref >60)
GFR, Estimated: >60 mL/min (ref >60)
GLUCOSE: 141 mg/dL — AB (ref 70–99)
Potassium: 4.2 mmol/L (ref 3.5–5.1)
SODIUM: 140 mmol/L (ref 135–145)
Total Bilirubin: 0.4 mg/dL (ref 0.3–1.2)
Total Protein: 6.7 g/dL (ref 6.5–8.1)

## 2018-06-02 NOTE — Telephone Encounter (Signed)
Gave pt avs and calendar  °

## 2018-06-28 ENCOUNTER — Other Ambulatory Visit: Payer: Self-pay | Admitting: Nurse Practitioner

## 2018-07-01 DIAGNOSIS — Z23 Encounter for immunization: Secondary | ICD-10-CM | POA: Diagnosis not present

## 2018-07-08 DIAGNOSIS — L603 Nail dystrophy: Secondary | ICD-10-CM | POA: Diagnosis not present

## 2018-07-26 ENCOUNTER — Ambulatory Visit (INDEPENDENT_AMBULATORY_CARE_PROVIDER_SITE_OTHER): Payer: Medicare Other | Admitting: Nurse Practitioner

## 2018-07-26 ENCOUNTER — Encounter: Payer: Self-pay | Admitting: Nurse Practitioner

## 2018-07-26 VITALS — BP 138/70 | HR 59 | Ht 71.5 in | Wt 201.1 lb

## 2018-07-26 DIAGNOSIS — I10 Essential (primary) hypertension: Secondary | ICD-10-CM

## 2018-07-26 DIAGNOSIS — Z8679 Personal history of other diseases of the circulatory system: Secondary | ICD-10-CM

## 2018-07-26 DIAGNOSIS — I480 Paroxysmal atrial fibrillation: Secondary | ICD-10-CM | POA: Diagnosis not present

## 2018-07-26 NOTE — Progress Notes (Signed)
CARDIOLOGY OFFICE NOTE  Date:  07/26/2018    Nathaniel [name] Date of Birth: 11-19-1919 Medical Record #[id number]  PCP:  [name], MD  Cardiologist:  Servando Snare    Chief Complaint  Patient presents with  . Atrial Fibrillation    6 month check. Former patient of Dr. Claris Gladden    History of Present Illness: Nathaniel [name] is a 84 y.o. male who presents today for a 6 month check. Seen for Dr. Aundra Dubin. Primarily follows with me.   He has been an Education administrator for 40 years.   He has hadacute pericarditis and atrial fibrillation.He was admitted in early 9/14 with pleuritic chest pain. He had diffuse slight ST elevation on ECG and was thought to have acute pericarditis, ?viral pericarditis. No pericardial effusion initially. While in the hospital, he went into atrial fibrillation with RVR and was treated with amiodarone. He converted to NSR andhas remained in NSR.He was started on Xarelto in the hospital. Followup echo later in 9/14 showed the development of a moderate pericardial effusion without tamponade. Xarelto was stopped. Patient had another echo on 06/06/13 that showed a small pericardial effusion (improved).   Hewas thendiagnosed with rectal cancer and underwent chemotherapy and radiation, opted against surgery.   Last seen by Dr. Aundra Dubin back in [place] was felt to be doing ok from our standpoint.I have followed him since. He has had followup CTs and sigmoidoscopy. Last seen in May and felt to be stable.  He has elected to remain on high dose aspirin - patient preference.   Comes in today. [place]. Using a walker. Remains at [employer] He says he "has yet to have a bad meal there". Doing well. No chest pain. Breathing is stable with stable DOE. No falls. Not dizzy. No palpitations. His swelling remains stable. Labs from September noted. Overall, he has no real concerns.   PMH: 1. Hyperlipidemia 2. Hypothyroidism 3. HTN 4.  Acute pericarditis: 9/14. Echo (05/11/13) with EF 55-60%, normal RV, no pericardial effusion. Echo (05/22/13) with moderate pericardial effusion, no tamponade. Echo (06/06/13) with small pericardial effusion, no tamponade.  5. Paroxysmal atrial fibrillation: In setting of acute pericarditis when initially noted. Was started on Xarelto but this was stopped when he developed a pericardial effusion.  6. Chronic low back pain 7. Venous insufficiency.  8. Rectal cancer: s/p chemotherapy and radiation. Decided against surgery.  9. Nephrolithiasis.   Past Medical History:  Diagnosis Date  . Anemia due to antineoplastic chemotherapy   . Atherosclerosis of coronary artery cardiologist-  dr Aundra Dubin   aorta and coronary arteries moderate per ct 02-07-2016  . Basal cell carcinoma    40+  . Bladder calculi   . BPH (benign prostatic hyperplasia)   . Calculi, urethra   . Chronic low back pain   . Constipation   . ED (erectile dysfunction)   . Edema of lower extremity   . Family history of melanoma   . Foley catheter in place   . History of adenomatous polyp of colon    2007  . History of basal cell carcinoma excision    multiple  . History of cellulitis    left lower leg-- resolved  . History of melanoma excision    temple right side/ multiple  . History of obstructive sleep apnea    per pt cured by hypnosis  . History of pericarditis    acute episode 09/ 2014  per cardiologist note , dr Aundra Dubin , related possibly to [place]  use  . History of ulcerative colitis    remote  . HTN (hypertension)   . Hypercholesteremia   . Hypothyroidism   . Incomplete right bundle branch block   . LAFB (left anterior fascicular block)   . Melanoma ([place])   . OA (osteoarthritis)   . PAF (paroxysmal atrial fibrillation) [facility]) cardiologist-  dr dalton Aundra Dubin   dx 09/ 2014  in setting of acute pericarditis   . Rectal cancer [facility]) dx 07-16-2015  oncologist-  dr [name]   Stage IIIB (cT3, N1, M0)--  concurrent  chemotherapy started 08-12-2015/  radiation complete (08-12-2015 to 09-20-2015)  . S/P radiation therapy 08/12/15-09/20/15   rectal ca50.4Gy total dose  . Spinal stenosis     Past Surgical History:  Procedure Laterality Date  . CATARACT EXTRACTION W/ INTRAOCULAR LENS IMPLANT Bilateral 10/ 2016  . CYSTOSCOPY WITH LITHOLAPAXY N/A 02/12/2016   Procedure: CYSTOSCOPY WITH LITHOLAPAXY;  Surgeon: Alexis Frock, MD;  Location[facility];  Service: Urology;  Laterality: N/A;  . EUS N/A 08/08/2015   Procedure: LOWER ENDOSCOPIC ULTRASOUND (EUS);  Surgeon: Arta Silence, MD;  Location: [facility] ENDOSCOPY;  Service: Endoscopy;  Laterality: N/A;  . FLEXIBLE SIGMOIDOSCOPY N/A 08/08/2015   Procedure: FLEXIBLE SIGMOIDOSCOPY;  Surgeon: Arta Silence, MD;  Location: [facility] ENDOSCOPY;  Service: Endoscopy;  Laterality: N/A;  poor prep  . HOLMIUM LASER APPLICATION N/A 7/0/3500   Procedure: HOLMIUM LASER APPLICATION;  Surgeon: Alexis Frock, MD;  Location: [facility];  Service: Urology;  Laterality: N/A;  . INGUINAL HERNIA REPAIR Left 1965  . LEFT KNEE SURGERY  1940  . MOHS SURGERY  x9  last one 2014   . ORIF LEFT ANKLE FX  1935  . ROTATOR CUFF REPAIR Bilateral right 2013/  left 2010  . TRANSTHORACIC ECHOCARDIOGRAM  06-06-2013   ef 55-60%/  mild AR/  mild dilated aortic root/  mild LAE and RAE/  mild to moderate TR/  small pericardial effusion identified     Medications: Current Meds  Medication Sig  . aspirin EC 325 MG tablet Take 325 mg by mouth daily.   . finasteride (PROSCAR) 5 MG tablet Take 5 mg by mouth every evening. Dose is taken at bedtime  . furosemide (LASIX) 20 MG tablet TAKE 1 TABLET BY MOUTH EVERY DAY  . KLOR-CON 10 10 MEQ tablet TAKE 1 TABLET BY MOUTH EVERY DAY  . levothyroxine (SYNTHROID, LEVOTHROID) 100 MCG tablet TAKE 1 TABLET ON AN EMPTY STOMACH IN THE MORNING ONCE A DAY  . metoprolol succinate (TOPROL-XL) 25 MG 24 hr tablet TAKE 1/2 TABLET BY MOUTH  DAILY.  . Multiple Vitamin (MULTIVITAMIN WITH MINERALS) TABS tablet Take 1 tablet by mouth daily.     Allergies: Allergies  Allergen Reactions  . Cinnamon Itching  . Ibuprofen Diarrhea  . Percocet [Oxycodone-Acetaminophen] Other (See Comments)    confusion  . Protonix [Pantoprazole Sodium] Diarrhea and Nausea And Vomiting  . Xarelto [Rivaroxaban] Other (See Comments)    Diarrhea, dehydration    Social History: The patient  reports that he has never smoked. He has never used smokeless tobacco. He reports that he drinks about 1.0 standard drinks of alcohol per week. He reports that he does not use drugs.   Family History: The patient's family history includes Cancer in his daughter; Cancer (age of onset: 36) in his daughter; Cancer - Cervical in his daughter; Heart disease in his brother, father, and maternal uncle; Melanoma (age of onset: 54) in his brother; Parkinson's disease in his  mother; Stroke in his father.   Review of Systems: Please see the history of present illness.   Otherwise, the review of systems is positive for none.   All other systems are reviewed and negative.   Physical Exam: VS:  BP 138/70 (BP Location: Left Arm, Patient Position: Sitting, Cuff Size: Normal)   Pulse (!) 59   Ht 5' 11.5" (1.816 m)   Wt 201 lb 1.9 oz (91.2 kg)   SpO2 97% Comment: at rest  BMI 27.66 kg/m  .  BMI Body mass index is 27.66 kg/m.  Wt Readings from Last 3 Encounters:  07/26/18 201 lb 1.9 oz (91.2 kg)  06/02/18 199 lb 4.8 oz (90.4 kg)  01/18/18 195 lb 1.9 oz (88.5 kg)    General: Pleasant. Elderly. He looks younger than his stated age. Alert and in no acute distress.  He is using a walker.  HEENT: Normal.  Neck: Supple, no JVD, carotid bruits, or masses noted.  Cardiac: Regular rate and rhythm. Soft outflow murmur. Stable lower extremity edema.  Respiratory:  Lungs are clear to auscultation bilaterally with normal work of breathing.  GI: Soft and nontender.  MS: No deformity  or atrophy. Gait and ROM intact.  Skin: Warm and dry. Color is normal.  Neuro:  Strength and sensation are intact and no gross focal deficits noted.  Psych: Alert, appropriate and with normal affect.   LABORATORY DATA:  EKG:  EKG is not ordered today.  Lab Results  Component Value Date   WBC 4.3 06/02/2018   HGB 13.4 06/02/2018   HCT 39.5 06/02/2018   PLT 201 06/02/2018   GLUCOSE 141 (H) 06/02/2018   ALT 17 06/02/2018   AST 20 06/02/2018   NA 140 06/02/2018   K 4.2 06/02/2018   CL 104 06/02/2018   CREATININE 0.84 06/02/2018   BUN 13 06/02/2018   CO2 26 06/02/2018   TSH 1.49 09/14/2013       BNP (last 3 results) No results for input(s): BNP in the last 8760 hours.  ProBNP (last 3 results) No results for input(s): PROBNP in the last 8760 hours.   Other Studies Reviewed Today:  EchoStudy Conclusionsfrom 08/2016  - Left ventricle: The cavity size was normal. There was moderate concentric hypertrophy. Systolic function was normal. The estimated ejection fraction was in the range of 60% to 65%. Wall motion was normal; there were no regional wall motion abnormalities. Doppler parameters are consistent with abnormal left ventricular relaxation (grade 1 diastolic dysfunction). Doppler parameters are consistent with indeterminate ventricular filling pressure. - Aortic valve: Transvalvular velocity was within the normal range. There was no stenosis. There was mild regurgitation. - Aorta: Ascending aortic diameter: 35 mm (S). - Mitral valve: Transvalvular velocity was within the normal range. There was no evidence for stenosis. There was no regurgitation. - Right ventricle: The cavity size was mildly dilated. Wall thickness was normal. Systolic function was normal. - Right atrium: The atrium was mildly dilated. - Atrial septum: No defect or patent foramen ovale was identified by color flow Doppler. - Tricuspid valve: There was mild  regurgitation. - Pulmonary arteries: PA peak pressure: 38 mm Hg (S).  Assessment/Plan:  1. History of pericarditis - dates back to 2014 -no recurrence.  2. PAF - one lone episode in the setting of acute pericarditis - no longer on Xarelto due to possibility that it was associated with pericardial effusion.CHADSVASC is at least a 3.He has been on high dose aspirin since this timedue to the reaction  from Xarelto - he wishes to continue and not reduce the dose. No problems noted. Labs from September noted.   3. Prior rectal bleeding/rectal cancer-he is about 3 years out from this diagnosis with no signs of recurrence.   4. Advanced age- still doing quite well.   5. HTN - BP fine here today - no changes made.   6. Chronic edema - managed with low dose lasix and potassium - this is unchanged -  no changes made today.   Current medicines are reviewed with the patient today.  The patient does not have concerns regarding medicines other than what has been noted above.  The following changes have been made:  See above.  Labs/ tests ordered today include:   No orders of the defined types were placed in this encounter.    Disposition:   FU with me in 6 months.   Patient is agreeable to this plan and will call if any problems develop in the interim.   SignedTruitt Merle, NP  07/26/2018 9:59 AM  [facility] [address] [place], [place]  [address] Phone: [phone] Fax: [phone]

## 2018-07-26 NOTE — Patient Instructions (Addendum)
We will be checking the following labs today - NONE    Medication Instructions:    Continue with your current medicines.    If you need a refill on your cardiac medications before your next appointment, please call your pharmacy.     Testing/Procedures To Be Arranged:  N/A  Follow-Up:   See me in 6 months    At [facility], you and your health needs are our priority.  As part of our continuing mission to provide you with exceptional heart care, we have created designated Provider Care Teams.  These Care Teams include your primary Cardiologist (physician) and Advanced Practice Providers (APPs -  Physician Assistants and Nurse Practitioners) who all work together to provide you with the care you need, when you need it.  Special Instructions:  . None  Call the  [facility] office at [phone] if you have any questions, problems or concerns.       

## 2018-09-16 ENCOUNTER — Other Ambulatory Visit: Payer: Self-pay | Admitting: Nurse Practitioner

## 2018-09-16 DIAGNOSIS — I480 Paroxysmal atrial fibrillation: Secondary | ICD-10-CM

## 2018-09-16 DIAGNOSIS — R0602 Shortness of breath: Secondary | ICD-10-CM

## 2018-09-19 ENCOUNTER — Telehealth: Payer: Self-pay | Admitting: Nurse Practitioner

## 2018-09-19 NOTE — Telephone Encounter (Signed)
S/w pt is aware of Lori's recommendation's.  Will increase lasix and potassium for 3 days and will come in next week to see Cecille Rubin.

## 2018-09-19 NOTE — Telephone Encounter (Signed)
Let's increase his Lasix to 40 mg and Potassium to 36meq daily for 3 days and see how that goes.   Probably need to see him back in next week or so.  Burtis Junes, RN, [place] [address] [place], [place]  [address] [phone]

## 2018-09-19 NOTE — Telephone Encounter (Signed)
° °  Pt c/o swelling: STAT is pt has developed SOB within 24 hours  1) How much weight have you gained and in what time span? 8 lbs in 2 weeks  2) If swelling, where is the swelling located? Entire leg  3) Are you currently taking a fluid pill? yes  4) Are you currently SOB?  Yes, "always"  5) Do you have a log of your daily weights (if so, list)? n/a  6) Have you gained 3 pounds in a day or 5 pounds in a week? n/a  7) Have you traveled recently? NO

## 2018-09-28 ENCOUNTER — Encounter: Payer: Self-pay | Admitting: Nurse Practitioner

## 2018-09-28 ENCOUNTER — Ambulatory Visit (INDEPENDENT_AMBULATORY_CARE_PROVIDER_SITE_OTHER): Payer: Medicare Other | Admitting: Nurse Practitioner

## 2018-09-28 VITALS — BP 152/80 | HR 79 | Ht 72.0 in | Wt 212.8 lb

## 2018-09-28 DIAGNOSIS — I10 Essential (primary) hypertension: Secondary | ICD-10-CM

## 2018-09-28 DIAGNOSIS — I480 Paroxysmal atrial fibrillation: Secondary | ICD-10-CM

## 2018-09-28 DIAGNOSIS — R0602 Shortness of breath: Secondary | ICD-10-CM | POA: Diagnosis not present

## 2018-09-28 DIAGNOSIS — R609 Edema, unspecified: Secondary | ICD-10-CM

## 2018-09-28 MED ORDER — FUROSEMIDE 20 MG PO TABS: 60.0000 mg | ORAL_TABLET | Freq: Every day | ORAL | 3 refills | Status: DC

## 2018-09-28 NOTE — Patient Instructions (Addendum)
We will be checking the following labs today - BMET, CBC, TSh and HPF    Medication Instructions:    Continue with your current medicines. BUT  I am increasing the Lasix to 60 mg to take every day - use 3 of your 20 mg tablets to = this dose  Stay on your aspirin   If you need a refill on your cardiac medications before your next appointment, please call your pharmacy.     Testing/Procedures To Be Arranged:  N/A  Follow-Up:   See me in about 7 to 10 days    At [facility], you and your health needs are our priority.  As part of our continuing mission to provide you with exceptional heart care, we have created designated Provider Care Teams.  These Care Teams include your primary Cardiologist (physician) and Advanced Practice Providers (APPs -  Physician Assistants and Nurse Practitioners) who all work together to provide you with the care you need, when you need it.  Special Instructions:  . You are back in atrial fib - we are going to keep you on aspirin - we feel this is safer than being on some other type of blood thinner.   Call the [place] office at [phone] if you have any questions, problems or concerns.

## 2018-09-28 NOTE — Progress Notes (Signed)
CARDIOLOGY OFFICE NOTE  Date:  09/28/2018      Nathaniel [name] Date of Birth: Oct 10, 1919 Medical Record #[id number]  PCP:  [name], MD  Cardiologist:  Servando Snare     Chief Complaint  Patient presents with  . Edema    Work in visit.     History of Present Illness: Nathaniel [name] is a 85 y.o. male who presents today for a work in visit. Seen for Dr. Aundra Dubin.Primarily follows with me.  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   He has hadacute pericarditis and atrial fibrillation.He was admitted in early 9/14 with pleuritic chest pain. He had diffuse slight ST elevation on ECG and was thought to have acute pericarditis, ?viral pericarditis. No pericardial effusion initially. While in the hospital, he went into atrial fibrillation with RVR and was treated with amiodarone - not long term. He converted to NSR andhas remained in NSR.He was started on Xarelto in the hospital. Followup echo later in 9/14 showed the development of a moderate pericardial effusion without tamponade. Xarelto was stopped. Patient had another echo on 06/06/13 that showed a small pericardial effusion (improved).   Hewas thendiagnosed with rectal cancer and underwent chemotherapy and radiation, opted against surgery.   Last seen byDr. Aundra Dubin back in [place] was felt to be doing ok from our standpoint.I have followed him since. He has had followup CTs and sigmoidoscopy. Last seen in November and he was felt to be stable. He has elected to remain on high dose aspirin - patient preference.   He called earlier this month - having swelling.   Comes in today. [place]. His weight continues to climb - despite the extra doses of Lasix that he used for 3 days. He feels like his DOE is unchanged. Having more trouble with getting his compression stockings on now. His diet has not changed. He does not have chest pain. It is primarily just more swelling in the legs -  more so in the left. His left leg has always tended to have more swelling. Does not note palpitations. He is incontinent as a general rule. He has no palpitations.   PMH: 1. Hyperlipidemia 2. Hypothyroidism 3. HTN 4. Acute pericarditis: 9/14. Echo (05/11/13) with EF 55-60%, normal RV, no pericardial effusion. Echo (05/22/13) with moderate pericardial effusion, no tamponade. Echo (06/06/13) with small pericardial effusion, no tamponade.  5. Paroxysmal atrial fibrillation: In setting of acute pericarditis when initially noted. Was started on Xarelto but this was stopped when he developed a pericardial effusion.  6. Chronic low back pain 7. Venous insufficiency.  8. Rectal cancer: s/p chemotherapy and radiation. Decided against surgery.  9. Nephrolithiasis.  Past Medical History:  Diagnosis Date  . Anemia due to antineoplastic chemotherapy   . Atherosclerosis of coronary artery cardiologist-  dr Aundra Dubin   aorta and coronary arteries moderate per ct 02-07-2016  . Basal cell carcinoma    40+  . Bladder calculi   . BPH (benign prostatic hyperplasia)   . Calculi, urethra   . Chronic low back pain   . Constipation   . ED (erectile dysfunction)   . Edema of lower extremity   . Family history of melanoma   . Foley catheter in place   . History of adenomatous polyp of colon    2007  . History of basal cell carcinoma excision    multiple  . History of cellulitis    left lower leg-- resolved  . History of  melanoma excision    temple right side/ multiple  . History of obstructive sleep apnea    per pt cured by hypnosis  . History of pericarditis    acute episode 09/ 2014  per cardiologist note , dr Aundra Dubin , related possibly to xarelto use  . History of ulcerative colitis    remote  . HTN (hypertension)   . Hypercholesteremia   . Hypothyroidism   . Incomplete right bundle branch block   . LAFB (left anterior fascicular block)   . Melanoma ([place])   . OA (osteoarthritis)   . PAF  (paroxysmal atrial fibrillation) [facility]) cardiologist-  dr [place] Aundra Dubin   dx 09/ 2014  in setting of acute pericarditis   . Rectal cancer [facility]) dx 07-16-2015  oncologist-  dr [name]   Stage IIIB (cT3, N1, M0)-- concurrent  chemotherapy started 08-12-2015/  radiation complete (08-12-2015 to 09-20-2015)  . S/P radiation therapy 08/12/15-09/20/15   rectal ca50.4Gy total dose  . Spinal stenosis     Past Surgical History:  Procedure Laterality Date  . CATARACT EXTRACTION W/ INTRAOCULAR LENS IMPLANT Bilateral 10/ 2016  . CYSTOSCOPY WITH LITHOLAPAXY N/A 02/12/2016   Procedure: CYSTOSCOPY WITH LITHOLAPAXY;  Surgeon: Alexis Frock, MD;  Location[facility];  Service: Urology;  Laterality: N/A;  . EUS N/A 08/08/2015   Procedure: LOWER ENDOSCOPIC ULTRASOUND (EUS);  Surgeon: Arta Silence, MD;  Location: [facility] ENDOSCOPY;  Service: Endoscopy;  Laterality: N/A;  . FLEXIBLE SIGMOIDOSCOPY N/A 08/08/2015   Procedure: FLEXIBLE SIGMOIDOSCOPY;  Surgeon: Arta Silence, MD;  Location: [facility] ENDOSCOPY;  Service: Endoscopy;  Laterality: N/A;  poor prep  . HOLMIUM LASER APPLICATION N/A 09/12/1094   Procedure: HOLMIUM LASER APPLICATION;  Surgeon: Alexis Frock, MD;  Location: [facility];  Service: Urology;  Laterality: N/A;  . INGUINAL HERNIA REPAIR Left 1965  . LEFT KNEE SURGERY  1940  . MOHS SURGERY  x9  last one 2014   . ORIF LEFT ANKLE FX  1935  . ROTATOR CUFF REPAIR Bilateral right 2013/  left 2010  . TRANSTHORACIC ECHOCARDIOGRAM  06-06-2013   ef 55-60%/  mild AR/  mild dilated aortic root/  mild LAE and RAE/  mild to moderate TR/  small pericardial effusion identified     Medications: Current Meds  Medication Sig  . aspirin EC 325 MG tablet Take 325 mg by mouth daily.   . finasteride (PROSCAR) 5 MG tablet Take 5 mg by mouth every evening. Dose is taken at bedtime  . furosemide (LASIX) 20 MG tablet Take 3 tablets (60 mg total) by mouth daily.  Marland Kitchen levothyroxine (SYNTHROID,  LEVOTHROID) 100 MCG tablet TAKE 1 TABLET ON AN EMPTY STOMACH IN THE MORNING ONCE A DAY  . metoprolol succinate (TOPROL-XL) 25 MG 24 hr tablet TAKE 1/2 TABLET BY MOUTH DAILY.  . Multiple Vitamin (MULTIVITAMIN WITH MINERALS) TABS tablet Take 1 tablet by mouth daily.  . potassium chloride (K-DUR) 10 MEQ tablet TAKE 1 TABLET BY MOUTH EVERY DAY  . [DISCONTINUED] furosemide (LASIX) 20 MG tablet TAKE 1 TABLET BY MOUTH EVERY DAY     Allergies: Allergies  Allergen Reactions  . Cinnamon Itching  . Ibuprofen Diarrhea  . Percocet [Oxycodone-Acetaminophen] Other (See Comments)    confusion  . Protonix [Pantoprazole Sodium] Diarrhea and Nausea And Vomiting  . Xarelto [Rivaroxaban] Other (See Comments)    Diarrhea, dehydration    Social History: The patient  reports that he has never smoked. He has never used smokeless tobacco. He reports current  alcohol use of about 1.0 standard drinks of alcohol per week. He reports that he does not use drugs.   Family History: The patient's family history includes Cancer in his daughter; Cancer (age of onset: 39) in his daughter; Cancer - Cervical in his daughter; Heart disease in his brother, father, and maternal uncle; Melanoma (age of onset: 15) in his brother; Parkinson's disease in his mother; Stroke in his father.   Review of Systems: Please see the history of present illness.   Otherwise, the review of systems is positive for none.   All other systems are reviewed and negative.   Physical Exam: VS:  BP (!) 152/80 (BP Location: Left Arm, Patient Position: Sitting, Cuff Size: Normal)   Pulse 79   Ht 6' (1.829 m)   Wt 212 lb 12.8 oz (96.5 kg)   SpO2 94% Comment: at rest  BMI 28.86 kg/m  .  BMI Body mass index is 28.86 kg/m.  Wt Readings from Last 3 Encounters:  09/28/18 212 lb 12.8 oz (96.5 kg)  07/26/18 201 lb 1.9 oz (91.2 kg)  06/02/18 199 lb 4.8 oz (90.4 kg)    General: Pleasant. Elderly. Alert and in no acute distress.  His weight is up 11  pounds since I last saw him in November. He is using a walker.  HEENT: Normal.  Neck: Supple, no JVD, carotid bruits, or masses noted.  Cardiac: ? Regular today - lots of skips. Massive edema in the lower legs and in the sacral area noted as well. Worse in the left leg.   Respiratory:  Lungs are clear to auscultation bilaterally with normal work of breathing.  GI: Soft and nontender.  MS: No deformity or atrophy. Gait and ROM intact. Using a walker.  Skin: Warm and dry. Color is normal.  Neuro:  Strength and sensation are intact and no gross focal deficits noted.  Psych: Alert, appropriate and with normal affect.   LABORATORY DATA:  EKG:  EKG is ordered today. This shows AF with controlled VR - reviewed with Dr. Lovena Le.   Lab Results  Component Value Date   WBC 4.3 06/02/2018   HGB 13.4 06/02/2018   HCT 39.5 06/02/2018   PLT 201 06/02/2018   GLUCOSE 141 (H) 06/02/2018   ALT 17 06/02/2018   AST 20 06/02/2018   NA 140 06/02/2018   K 4.2 06/02/2018   CL 104 06/02/2018   CREATININE 0.84 06/02/2018   BUN 13 06/02/2018   CO2 26 06/02/2018   TSH 1.49 09/14/2013     BNP (last 3 results) No results for input(s): BNP in the last 8760 hours.  ProBNP (last 3 results) No results for input(s): PROBNP in the last 8760 hours.   Other Studies Reviewed Today:  EchoStudy Conclusionsfrom 08/2016  - Left ventricle: The cavity size was normal. There was moderate concentric hypertrophy. Systolic function was normal. The estimated ejection fraction was in the range of 60% to 65%. Wall motion was normal; there were no regional wall motion abnormalities. Doppler parameters are consistent with abnormal left ventricular relaxation (grade 1 diastolic dysfunction). Doppler parameters are consistent with indeterminate ventricular filling pressure. - Aortic valve: Transvalvular velocity was within the normal range. There was no stenosis. There was mild regurgitation. -  Aorta: Ascending aortic diameter: 35 mm (S). - Mitral valve: Transvalvular velocity was within the normal range. There was no evidence for stenosis. There was no regurgitation. - Right ventricle: The cavity size was mildly dilated. Wall thickness was normal. Systolic function was  normal. - Right atrium: The atrium was mildly dilated. - Atrial septum: No defect or patent foramen ovale was identified by color flow Doppler. - Tricuspid valve: There was mild regurgitation. - Pulmonary arteries: PA peak pressure: 38 mm Hg (S).  Assessment/Plan:  1. Worsening lower extremity edema - known venous insufficiency - now back in AF - this is most likely the etiology for the worsening edema. Increasing Lasix today to 60mg  until seen back next week. Lab today.   2. Prior history of pericarditis - dates back to 2014 -no recurrence.  3. Prior PAF - one lone episode in the setting of acute pericarditis - no longer on Xarelto due to possibility that it was associated with pericardial effusion.CHADSVASC is at least a 3.He has been on high dose aspirin since this timedue to the reaction from Xarelto -he wishes to continue and not reduce the dose.   Now back in AF - rate is controlled - CHADSVASC is 3 and HASBLED is 3 - discussed with Dr. Lovena Le - given his age and prior issue with Xarelto - will stay on high dose aspirin and not placed back on anticoagulation.   4. Prior rectal bleeding/rectal cancer-he is about 3 years out from this diagnosis with no signs of recurrence.   5. HTN - BP up some here today - suspect due to weight/swelling - increasing diuretics today.   Current medicines are reviewed with the patient today.  The patient does not have concerns regarding medicines other than what has been noted above.  The following changes have been made:  See above.  Labs/ tests ordered today include:    Orders Placed This Encounter  Procedures  . Basic metabolic panel  . CBC  .  Hepatic function panel  . TSH  . EKG 12-Lead     Disposition:   FU with me in about 7 to 10 days for recheck.    Patient is agreeable to this plan and will call if any problems develop in the interim.   SignedTruitt Merle, NP  09/28/2018 12:10 PM  [facility] [address] [place], [place]  [address] Phone: [phone] Fax: [phone]

## 2018-09-29 ENCOUNTER — Telehealth: Payer: Self-pay | Admitting: Nurse Practitioner

## 2018-09-29 LAB — HEPATIC FUNCTION PANEL
ALT: 20 IU/L (ref 0–44)
AST: 30 IU/L (ref 0–40)
Albumin: 4.2 g/dL (ref 3.5–4.6)
Alkaline Phosphatase: 101 IU/L (ref 39–117)
Bilirubin Total: 0.5 mg/dL (ref 0.0–1.2)
Bilirubin, Direct: 0.12 mg/dL (ref 0.00–0.40)
Total Protein: 6.5 g/dL (ref 6.0–8.5)

## 2018-09-29 LAB — TSH: TSH: 5.64 u[IU]/mL — ABNORMAL HIGH (ref 0.450–4.500)

## 2018-09-29 LAB — CBC
Hematocrit: 39.6 % (ref 37.5–51.0)
Hemoglobin: 13.5 g/dL (ref 13.0–17.7)
MCH: 32.1 pg (ref 26.6–33.0)
MCHC: 34.1 g/dL (ref 31.5–35.7)
MCV: 94 fL (ref 79–97)
Platelets: 252 10*3/uL (ref 150–450)
RBC: 4.21 x10E6/uL (ref 4.14–5.80)
RDW: 12.8 % (ref 11.6–15.4)
WBC: 6.5 10*3/uL (ref 3.4–10.8)

## 2018-09-29 LAB — BASIC METABOLIC PANEL
BUN/Creatinine Ratio: 17 (ref 10–24)
BUN: 16 mg/dL (ref 10–36)
CO2: 24 mmol/L (ref 20–29)
Calcium: 9.4 mg/dL (ref 8.6–10.2)
Chloride: 97 mmol/L (ref 96–106)
Creatinine, Ser: 0.92 mg/dL (ref 0.76–1.27)
GFR calc Af Amer: 81 mL/min/{1.73_m2} (ref >59)
GFR calc non Af Amer: 70 mL/min/{1.73_m2} (ref >59)
Glucose: 87 mg/dL (ref 65–99)
Potassium: 4.7 mmol/L (ref 3.5–5.2)
Sodium: 140 mmol/L (ref 134–144)

## 2018-09-29 NOTE — Telephone Encounter (Signed)
New message ° ° °Patient is returning call for lab results. °

## 2018-10-05 ENCOUNTER — Ambulatory Visit (INDEPENDENT_AMBULATORY_CARE_PROVIDER_SITE_OTHER): Payer: Medicare Other | Admitting: Nurse Practitioner

## 2018-10-05 ENCOUNTER — Encounter: Payer: Self-pay | Admitting: Nurse Practitioner

## 2018-10-05 VITALS — BP 142/72 | HR 79 | Ht 72.0 in | Wt 212.8 lb

## 2018-10-05 DIAGNOSIS — I10 Essential (primary) hypertension: Secondary | ICD-10-CM

## 2018-10-05 DIAGNOSIS — I4819 Other persistent atrial fibrillation: Secondary | ICD-10-CM

## 2018-10-05 MED ORDER — LEVOTHYROXINE SODIUM 112 MCG PO TABS: 112.0000 ug | ORAL_TABLET | Freq: Every day | ORAL | 3 refills | Status: DC

## 2018-10-05 MED ORDER — TORSEMIDE 20 MG PO TABS: 20.0000 mg | ORAL_TABLET | Freq: Every day | ORAL | 6 refills | Status: DC

## 2018-10-05 NOTE — Progress Notes (Signed)
CARDIOLOGY OFFICE NOTE  Date:  10/05/2018    [name] Date of Birth: Sep 24, 1919 Medical Record #[id number]  PCP:  [name], MD  Cardiologist:  Servando Snare     Chief Complaint  Patient presents with  . Atrial Fibrillation  . Edema    Follow up visit.     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 Seen for Dr. Aundra Dubin.Primarily follows with me.  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  He has hadacute pericarditis and atrial fibrillation.He was admitted in early 9/14 with pleuritic chest pain. He had diffuse slight ST elevation on ECG and was thought to have acute pericarditis, ?viral pericarditis. No pericardial effusion initially. While in the hospital, he went into atrial fibrillation with RVR and was treated with amiodarone - not long term. He converted to NSR andhas remained in NSR.He was started on Xarelto in the hospital. Followup echo later in 9/14 showed the development of a moderate pericardial effusion without tamponade. Xarelto was stopped. Patient had another echo on 06/06/13 that showed a small pericardial effusion (improved).   Hewas thendiagnosed with rectal cancer and underwent chemotherapy and radiation, opted against surgery.   Last seen byDr. Aundra Dubin back in [place] was felt to be doing ok from our standpoint.Ihave followed him since. He has had followup CTs and sigmoidoscopy with good reports. Last seen in November by me and he was felt to be stable. He has elected to remain on high dose aspirin - patient preference.  I saw him last week as a work in - his weight was up considerably. He was in atrial fib - discussed with Dr. Lovena Le - decided to NOT anticoagulate. Lasix was increased for his swelling. Otherwise did not seem to symptomatic.   Comes in today. [place]. He looks better today. His shortness of breath is at his baseline. He  feels like his swelling has improved - but his weight is unchanged. His left leg is weeping. Left leg is always bigger than the right. No chest pain. Some lethargy but overall he feels like he is doing ok. He wishes his swelling was better. His TSH was elevated - he sees PCP next month.   PMH: 1. Hyperlipidemia 2. Hypothyroidism 3. HTN 4. Acute pericarditis: 9/14. Echo (05/11/13) with EF 55-60%, normal RV, no pericardial effusion. Echo (05/22/13) with moderate pericardial effusion, no tamponade. Echo (06/06/13) with small pericardial effusion, no tamponade.  5. Paroxysmal atrial fibrillation: In setting of acute pericarditis when initially noted. Was started on Xarelto but this was stopped when he developed a pericardial effusion.  6. Chronic low back pain 7. Venous insufficiency.  8. Rectal cancer: s/p chemotherapy and radiation. Decided against surgery.  9. Nephrolithiasis.  Past Medical History:  Diagnosis Date  . Anemia due to antineoplastic chemotherapy   . Atherosclerosis of coronary artery cardiologist-  dr Aundra Dubin   aorta and coronary arteries moderate per ct 02-07-2016  . Basal cell carcinoma    40+  . Bladder calculi   . BPH (benign prostatic hyperplasia)   . Calculi, urethra   . Chronic low back pain   . Constipation   . ED (erectile dysfunction)   . Edema of lower extremity   . Family history of melanoma   . Foley catheter in place   . History of adenomatous polyp of colon    2007  . History of basal cell carcinoma excision  multiple  . History of cellulitis    left lower leg-- resolved  . History of melanoma excision    temple right side/ multiple  . History of obstructive sleep apnea    per pt cured by hypnosis  . History of pericarditis    acute episode 09/ 2014  per cardiologist note , dr Aundra Dubin , related possibly to xarelto use  . History of ulcerative colitis    remote  . HTN (hypertension)   . Hypercholesteremia   . Hypothyroidism   . Incomplete  right bundle branch block   . LAFB (left anterior fascicular block)   . Melanoma ([place])   . OA (osteoarthritis)   . PAF (paroxysmal atrial fibrillation) [facility]) cardiologist-  dr [place] Aundra Dubin   dx 09/ 2014  in setting of acute pericarditis   . Rectal cancer [facility]) dx 07-16-2015  oncologist-  dr [name]   Stage IIIB (cT3, N1, M0)-- concurrent  chemotherapy started 08-12-2015/  radiation complete (08-12-2015 to 09-20-2015)  . S/P radiation therapy 08/12/15-09/20/15   rectal ca50.4Gy total dose  . Spinal stenosis     Past Surgical History:  Procedure Laterality Date  . CATARACT EXTRACTION W/ INTRAOCULAR LENS IMPLANT Bilateral 10/ 2016  . CYSTOSCOPY WITH LITHOLAPAXY N/A 02/12/2016   Procedure: CYSTOSCOPY WITH LITHOLAPAXY;  Surgeon: Alexis Frock, MD;  Location: [facility];  Service: Urology;  Laterality: N/A;  . EUS N/A 08/08/2015   Procedure: LOWER ENDOSCOPIC ULTRASOUND (EUS);  Surgeon: Arta Silence, MD;  Location: [facility] ENDOSCOPY;  Service: Endoscopy;  Laterality: N/A;  . FLEXIBLE SIGMOIDOSCOPY N/A 08/08/2015   Procedure: FLEXIBLE SIGMOIDOSCOPY;  Surgeon: Arta Silence, MD;  Location: [facility] ENDOSCOPY;  Service: Endoscopy;  Laterality: N/A;  poor prep  . HOLMIUM LASER APPLICATION N/A 02/11/2093   Procedure: HOLMIUM LASER APPLICATION;  Surgeon: Alexis Frock, MD;  Location: [facility];  Service: Urology;  Laterality: N/A;  . INGUINAL HERNIA REPAIR Left 1965  . LEFT KNEE SURGERY  1940  . MOHS SURGERY  x9  last one 2014   . ORIF LEFT ANKLE FX  1935  . ROTATOR CUFF REPAIR Bilateral right 2013/  left 2010  . TRANSTHORACIC ECHOCARDIOGRAM  06-06-2013   ef 55-60%/  mild AR/  mild dilated aortic root/  mild LAE and RAE/  mild to moderate TR/  small pericardial effusion identified     Medications: Current Meds  Medication Sig  . aspirin EC 325 MG tablet Take 325 mg by mouth daily.   . finasteride (PROSCAR) 5 MG tablet Take 5 mg by mouth every evening. Dose is taken at  bedtime  . metoprolol succinate (TOPROL-XL) 25 MG 24 hr tablet TAKE 1/2 TABLET BY MOUTH DAILY.  . Multiple Vitamin (MULTIVITAMIN WITH MINERALS) TABS tablet Take 1 tablet by mouth daily.  . potassium chloride (K-DUR) 10 MEQ tablet TAKE 1 TABLET BY MOUTH EVERY DAY  . [DISCONTINUED] furosemide (LASIX) 20 MG tablet Take 3 tablets (60 mg total) by mouth daily.  . [DISCONTINUED] levothyroxine (SYNTHROID, LEVOTHROID) 100 MCG tablet TAKE 1 TABLET ON AN EMPTY STOMACH IN THE MORNING ONCE A DAY     Allergies: Allergies  Allergen Reactions  . Cinnamon Itching  . Ibuprofen Diarrhea  . Percocet [Oxycodone-Acetaminophen] Other (See Comments)    confusion  . Protonix [Pantoprazole Sodium] Diarrhea and Nausea And Vomiting  . Xarelto [Rivaroxaban] Other (See Comments)    Diarrhea, dehydration    Social History: The patient  reports that he has never smoked. He has never used smokeless  tobacco. He reports current alcohol use of about 1.0 standard drinks of alcohol per week. He reports that he does not use drugs.   Family History: The patient's family history includes Cancer in his daughter; Cancer (age of onset: 68) in his daughter; Cancer - Cervical in his daughter; Heart disease in his brother, father, and maternal uncle; Melanoma (age of onset: 23) in his brother; Parkinson's disease in his mother; Stroke in his father.   Review of Systems: Please see the history of present illness.   Otherwise, the review of systems is positive for none.   All other systems are reviewed and negative.   Physical Exam: VS:  BP (!) 142/72 (BP Location: Left Arm, Patient Position: Sitting, Cuff Size: Normal)   Pulse 79   Ht 6' (1.829 m)   Wt 212 lb 12.8 oz (96.5 kg)   SpO2 96% Comment: at rest  BMI 28.86 kg/m  .  BMI Body mass index is 28.86 kg/m.  Wt Readings from Last 3 Encounters:  10/05/18 212 lb 12.8 oz (96.5 kg)  09/28/18 212 lb 12.8 oz (96.5 kg)  07/26/18 201 lb 1.9 oz (91.2 kg)    General:  Pleasant. Well developed, well nourished and in no acute distress.   HEENT: Normal.  Neck: Supple, no JVD, carotid bruits, or masses noted.  Cardiac: Irregular irregular rhythm. His rate is ok. Legs remain very full - especially the left - the left leg is weeping. His swelling looks better today despite no weight loss.   Respiratory:  Lungs are clear to auscultation bilaterally with normal work of breathing.  GI: Soft and nontender.  MS: No deformity or atrophy. Gait and ROM intact. He is using a walker.  Skin: Warm and dry. Color is normal.  Neuro:  Strength and sensation are intact and no gross focal deficits noted.  Psych: Alert, appropriate and with normal affect.   LABORATORY DATA:  EKG:  EKG is not ordered today.  Lab Results  Component Value Date   WBC 6.5 09/28/2018   HGB 13.5 09/28/2018   HCT 39.6 09/28/2018   PLT 252 09/28/2018   GLUCOSE 87 09/28/2018   ALT 20 09/28/2018   AST 30 09/28/2018   NA 140 09/28/2018   K 4.7 09/28/2018   CL 97 09/28/2018   CREATININE 0.92 09/28/2018   BUN 16 09/28/2018   CO2 24 09/28/2018   TSH 5.640 (H) 09/28/2018     BNP (last 3 results) No results for input(s): BNP in the last 8760 hours.  ProBNP (last 3 results) No results for input(s): PROBNP in the last 8760 hours.   Other Studies Reviewed Today:  EchoStudy Conclusionsfrom 08/2016  - Left ventricle: The cavity size was normal. There was moderate concentric hypertrophy. Systolic function was normal. The estimated ejection fraction was in the range of 60% to 65%. Wall motion was normal; there were no regional wall motion abnormalities. Doppler parameters are consistent with abnormal left ventricular relaxation (grade 1 diastolic dysfunction). Doppler parameters are consistent with indeterminate ventricular filling pressure. - Aortic valve: Transvalvular velocity was within the normal range. There was no stenosis. There was mild regurgitation. - Aorta:  Ascending aortic diameter: 35 mm (S). - Mitral valve: Transvalvular velocity was within the normal range. There was no evidence for stenosis. There was no regurgitation. - Right ventricle: The cavity size was mildly dilated. Wall thickness was normal. Systolic function was normal. - Right atrium: The atrium was mildly dilated. - Atrial septum: No defect or patent foramen  ovale was identified by color flow Doppler. - Tricuspid valve: There was mild regurgitation. - Pulmonary arteries: PA peak pressure: 38 mm Hg (S).  Assessment/Plan:  1. Worsening lower extremity edema - known venous insufficiency - found to be in AF last week - this is felt to the be the cause for his weight gain and worsening of swelling. No real weight loss. He looks better clinically today. Will try changing to Torsemide 20 mg a day. Recheck BMET today. See back in about 3 weeks. He is going to try and elevate his legs more as well.   2. Prior history of pericarditis - dates back to 2014 -no recurrence.  3. Prior PAF - now persistent - his original episode in the setting of acute pericarditis - no longer on Xarelto due to possibility that it was associated with pericardial effusion.CHADSVASC is at least a 3.He has been on high dose aspirin since this timedue to the reaction from Xarelto.   Now back in AF - rate is controlled - CHADSVASC is 3 and HASBLED is 3 - discussed with Dr. Lovena Le last week - given his age and prior issue with Xarelto - will stay on high dose aspirin and not placed back on anticoagulation.   4. Prior rectal bleeding/rectal cancer-he is about 3 years out from this diagnosis with no signs of recurrence.Not discussed.   5. HTN -BP better today.   Current medicines are reviewed with the patient today.  The patient does not have concerns regarding medicines other than what has been noted above.  The following changes have been made:  See above.  Labs/ tests ordered today  include:   No orders of the defined types were placed in this encounter.    Disposition:   FU with me in 3 weeks.   Patient is agreeable to this plan and will call if any problems develop in the interim.   SignedTruitt Merle, NP  10/05/2018 12:14 PM  [facility] [address] [place],   [address] Phone: [phone] Fax: [phone]

## 2018-10-05 NOTE — Patient Instructions (Addendum)
We will be checking the following labs today - BMET    Medication Instructions:    Continue with your current medicines. BUT  I am increasing the Synthroid to 112 mcg daily - this is at the pharmacy  I am stopping Lasix (Furosemide)  I am starting Torsemide (Demedex) 20 mg to take once a day - this is at the pharmacy   If you need a refill on your cardiac medications before your next appointment, please call your pharmacy.     Testing/Procedures To Be Arranged:  N/A  Follow-Up:   See me in about 3 weeks.     At [facility], you and your health needs are our priority.  As part of our continuing mission to provide you with exceptional heart care, we have created designated Provider Care Teams.  These Care Teams include your primary Cardiologist (physician) and Advanced Practice Providers (APPs -  Physician Assistants and Nurse Practitioners) who all work together to provide you with the care you need, when you need it.  Special Instructions:  . None  Call the [place] office at [phone] if you have any questions, problems or concerns.

## 2018-10-06 LAB — BASIC METABOLIC PANEL
BUN/Creatinine Ratio: 16 (ref 10–24)
BUN: 17 mg/dL (ref 10–36)
CO2: 24 mmol/L (ref 20–29)
Calcium: 9.4 mg/dL (ref 8.6–10.2)
Chloride: 98 mmol/L (ref 96–106)
Creatinine, Ser: 1.06 mg/dL (ref 0.76–1.27)
GFR calc Af Amer: 68 mL/min/{1.73_m2} (ref >59)
GFR calc non Af Amer: 59 mL/min/{1.73_m2} — ABNORMAL LOW (ref >59)
Glucose: 92 mg/dL (ref 65–99)
Potassium: 4.8 mmol/L (ref 3.5–5.2)
Sodium: 138 mmol/L (ref 134–144)

## 2018-10-26 ENCOUNTER — Ambulatory Visit: Payer: Medicare Other | Admitting: Nurse Practitioner

## 2018-10-26 DIAGNOSIS — H35073 Retinal telangiectasis, bilateral: Secondary | ICD-10-CM | POA: Diagnosis not present

## 2018-10-26 DIAGNOSIS — Z961 Presence of intraocular lens: Secondary | ICD-10-CM | POA: Diagnosis not present

## 2018-11-01 ENCOUNTER — Ambulatory Visit (INDEPENDENT_AMBULATORY_CARE_PROVIDER_SITE_OTHER): Payer: Medicare Other | Admitting: Nurse Practitioner

## 2018-11-01 ENCOUNTER — Encounter: Payer: Self-pay | Admitting: Nurse Practitioner

## 2018-11-01 VITALS — BP 128/60 | HR 87 | Ht 72.0 in | Wt 210.0 lb

## 2018-11-01 DIAGNOSIS — C20 Malignant neoplasm of rectum: Secondary | ICD-10-CM | POA: Diagnosis not present

## 2018-11-01 DIAGNOSIS — R609 Edema, unspecified: Secondary | ICD-10-CM

## 2018-11-01 MED ORDER — TORSEMIDE 20 MG PO TABS
40.0000 mg | ORAL_TABLET | Freq: Every day | ORAL | 6 refills | Status: DC
Start: 2018-11-01 — End: 2019-01-17

## 2018-11-01 NOTE — Progress Notes (Signed)
CARDIOLOGY OFFICE NOTE  Date:  11/01/2018    [name] Date of Birth: 04/04/1920 Medical Record #[id number]  PCP:  [name], MD  Cardiologist:  Servando Snare     Chief Complaint  Patient presents with  . Follow-up    History of Present Illness: Nathaniel [name] is a 85 y.o. male who presents today for a one month check. Seen for Dr. Aundra Dubin.Primarily follows with me.  He has been an Education administrator forover40 years.  He has hadacute pericarditis and atrial fibrillation.He was admitted in early 9/14 with pleuritic chest pain. He had diffuse slight ST elevation on ECG and was thought to have acute pericarditis, ?viral pericarditis. No pericardial effusion initially. While in the hospital, he went into atrial fibrillation with RVR and was treated with amiodarone - not long term. He converted to NSR andhas remained in NSR.He was started on Xarelto in the hospital. Followup echo later in 9/14 showed the development of a moderate pericardial effusion without tamponade. Xarelto was stopped. Patient had another echo on 06/06/13 that showed a small pericardial effusion (improved).   Hewas thendiagnosed with rectal cancer and underwent chemotherapy and radiation, opted against surgery.   Last seen byDr. Aundra Dubin [place] was felt to be doing ok from our standpoint.Ihave followed him since. He has had followup CTs and sigmoidoscopy with good reports. Last seen inNovember by me and he wasfelt to be stable. He has elected to remain on high dose aspirin - patient preference.  I have seen him back a few times over the past 6 weeks - weight was up considerably - he had more swelling - typically his left leg is always bigger than the right - he was in AF - discussed with Dr. Lovena Le - decided to NOT anticoagulate given his age. Lasix was increased for his swelling. He did not appear to otherwise be symptomatic. I ended up changing him to  Torsemide at last visit. He was to see his PCP soon regarding his thyroid level.   Comes in today. [place]. He now has worsening swelling in the right leg - this is quite unusual for him. Both legs have been weeping.  Weight is down only 2 pounds today. His breathing is about the same. No chest pain. No awareness of his atrial fib. He had his last CT for his cancer back in 2018.   PMH: 1. Hyperlipidemia 2. Hypothyroidism 3. HTN 4. Acute pericarditis: 9/14. Echo (05/11/13) with EF 55-60%, normal RV, no pericardial effusion. Echo (05/22/13) with moderate pericardial effusion, no tamponade. Echo (06/06/13) with small pericardial effusion, no tamponade.  5. Paroxysmal atrial fibrillation: In setting of acute pericarditis when initially noted. Was started on Xarelto but this was stopped when he developed a pericardial effusion.  6. Chronic low back pain 7. Venous insufficiency.  8. Rectal cancer: s/p chemotherapy and radiation. Decided against surgery.  9. Nephrolithiasis.  Past Medical History:  Diagnosis Date  . Anemia due to antineoplastic chemotherapy   . Atherosclerosis of coronary artery cardiologist-  dr Aundra Dubin   aorta and coronary arteries moderate per ct 02-07-2016  . Basal cell carcinoma    40+  . Bladder calculi   . BPH (benign prostatic hyperplasia)   . Calculi, urethra   . Chronic low back pain   . Constipation   . ED (erectile dysfunction)   . Edema of lower extremity   . Family history of melanoma   . Foley catheter in place   . History  of adenomatous polyp of colon    2007  . History of basal cell carcinoma excision    multiple  . History of cellulitis    left lower leg-- resolved  . History of melanoma excision    temple right side/ multiple  . History of obstructive sleep apnea    per pt cured by hypnosis  . History of pericarditis    acute episode 09/ 2014  per cardiologist note , dr Aundra Dubin , related possibly to xarelto use  . History of ulcerative  colitis    remote  . HTN (hypertension)   . Hypercholesteremia   . Hypothyroidism   . Incomplete right bundle branch block   . LAFB (left anterior fascicular block)   . Melanoma ([place])   . OA (osteoarthritis)   . PAF (paroxysmal atrial fibrillation) [facility]) cardiologist-  dr dalton Aundra Dubin   dx 09/ 2014  in setting of acute pericarditis   . Rectal cancer [facility]) dx 07-16-2015  oncologist-  dr [name]   Stage IIIB (cT3, N1, M0)-- concurrent  chemotherapy started 08-12-2015/  radiation complete (08-12-2015 to 09-20-2015)  . S/P radiation therapy 08/12/15-09/20/15   rectal ca50.4Gy total dose  . Spinal stenosis     Past Surgical History:  Procedure Laterality Date  . CATARACT EXTRACTION W/ INTRAOCULAR LENS IMPLANT Bilateral 10/ 2016  . CYSTOSCOPY WITH LITHOLAPAXY N/A 02/12/2016   Procedure: CYSTOSCOPY WITH LITHOLAPAXY;  Surgeon: Alexis Frock, MD;  Location: [facility];  Service: Urology;  Laterality: N/A;  . EUS N/A 08/08/2015   Procedure: LOWER ENDOSCOPIC ULTRASOUND (EUS);  Surgeon: Arta Silence, MD;  Location[facility] END[facility]COPY;  Service: Endoscopy;  Laterality: N/A;  . FLEXIBLE SIGMOIDOSCOPY N/A 08/08/2015   Procedure: FLEXIBLE SIGMOIDOSCOPY;  Surgeon: Arta Silence, MD;  Location: [facility] ENDOSCOPY;  Service: Endoscopy;  Laterality: N/A;  poor prep  . HOLMIUM LASER APPLICATION N/A 03/15/2955   Procedure: HOLMIUM LASER APPLICATION;  Surgeon: Alexis Frock, MD;  Location: [facility];  Service: Urology;  Laterality: N/A;  . INGUINAL HERNIA REPAIR Left 1965  . LEFT KNEE SURGERY  1940  . MOHS SURGERY  x9  last one 2014   . ORIF LEFT ANKLE FX  1935  . ROTATOR CUFF REPAIR Bilateral right 2013/  left 2010  . TRANSTHORACIC ECHOCARDIOGRAM  06-06-2013   ef 55-60%/  mild AR/  mild dilated aortic root/  mild LAE and RAE/  mild to moderate TR/  small pericardial effusion identified     Medications: Current Meds  Medication Sig  . aspirin EC 325 MG tablet Take 325 mg by  mouth daily.   . finasteride (PROSCAR) 5 MG tablet Take 5 mg by mouth every evening. Dose is taken at bedtime  . levothyroxine (SYNTHROID) 112 MCG tablet Take 1 tablet (112 mcg total) by mouth daily before breakfast.  . metoprolol succinate (TOPROL-XL) 25 MG 24 hr tablet TAKE 1/2 TABLET BY MOUTH DAILY.  . Multiple Vitamin (MULTIVITAMIN WITH MINERALS) TABS tablet Take 1 tablet by mouth daily.  . potassium chloride (K-DUR) 10 MEQ tablet TAKE 1 TABLET BY MOUTH EVERY DAY  . torsemide (DEMADEX) 20 MG tablet Take 2 tablets (40 mg total) by mouth daily.  . [DISCONTINUED] torsemide (DEMADEX) 20 MG tablet Take 1 tablet (20 mg total) by mouth daily.     Allergies: Allergies  Allergen Reactions  . Cinnamon Itching  . Ibuprofen Diarrhea  . Percocet [Oxycodone-Acetaminophen] Other (See Comments)    confusion  . Protonix [Pantoprazole Sodium] Diarrhea and Nausea And  Vomiting  . Xarelto [Rivaroxaban] Other (See Comments)    Diarrhea, dehydration    Social History: The patient  reports that he has never smoked. He has never used smokeless tobacco. He reports current alcohol use of about 1.0 standard drinks of alcohol per week. He reports that he does not use drugs.   Family History: The patient's family history includes Cancer in his daughter; Cancer (age of onset: 60) in his daughter; Cancer - Cervical in his daughter; Heart disease in his brother, father, and maternal uncle; Melanoma (age of onset: 41) in his brother; Parkinson's disease in his mother; Stroke in his father.   Review of Systems: Please see the history of present illness.   Otherwise, the review of systems is positive for none.   All other systems are reviewed and negative.   Physical Exam: VS:  BP 128/60 (BP Location: Left Arm, Patient Position: Sitting, Cuff Size: Normal)   Pulse 87   Ht 6' (1.829 m)   Wt 210 lb (95.3 kg)   SpO2 98% Comment: at rest  BMI 28.48 kg/m  .  BMI Body mass index is 28.48 kg/m.  Wt Readings from  Last 3 Encounters:  11/01/18 210 lb (95.3 kg)  10/05/18 212 lb 12.8 oz (96.5 kg)  09/28/18 212 lb 12.8 oz (96.5 kg)    General: Pleasant. Elderly. Alert and in no acute distress.   HEENT: Normal.  Neck: Supple, no JVD, carotid bruits, or masses noted.  Cardiac: Irregular irregular rhythm. Rate is ok. He has massive edema now of both legs - it goes all the way up both thighs - probably with some sacral involvement as well.  Respiratory:  Lungs are clear to auscultation bilaterally with normal work of breathing.  GI: Soft and nontender.  MS: No deformity or atrophy. Gait and ROM intact. Using a walker  Skin: Warm and dry. Color is normal.  Neuro:  Strength and sensation are intact and no gross focal deficits noted.  Psych: Alert, appropriate and with normal affect.   LABORATORY DATA:  EKG:  EKG is not ordered today.  Lab Results  Component Value Date   WBC 6.5 09/28/2018   HGB 13.5 09/28/2018   HCT 39.6 09/28/2018   PLT 252 09/28/2018   GLUCOSE 92 10/05/2018   ALT 20 09/28/2018   AST 30 09/28/2018   NA 138 10/05/2018   K 4.8 10/05/2018   CL 98 10/05/2018   CREATININE 1.06 10/05/2018   BUN 17 10/05/2018   CO2 24 10/05/2018   TSH 5.640 (H) 09/28/2018       BNP (last 3 results) No results for input(s): BNP in the last 8760 hours.  ProBNP (last 3 results) No results for input(s): PROBNP in the last 8760 hours.   Other Studies Reviewed Today:  EchoStudy Conclusionsfrom 08/2016  - Left ventricle: The cavity size was normal. There was moderate concentric hypertrophy. Systolic function was normal. The estimated ejection fraction was in the range of 60% to 65%. Wall motion was normal; there were no regional wall motion abnormalities. Doppler parameters are consistent with abnormal left ventricular relaxation (grade 1 diastolic dysfunction). Doppler parameters are consistent with indeterminate ventricular filling pressure. - Aortic valve:  Transvalvular velocity was within the normal range. There was no stenosis. There was mild regurgitation. - Aorta: Ascending aortic diameter: 35 mm (S). - Mitral valve: Transvalvular velocity was within the normal range. There was no evidence for stenosis. There was no regurgitation. - Right ventricle: The cavity size was mildly  dilated. Wall thickness was normal. Systolic function was normal. - Right atrium: The atrium was mildly dilated. - Atrial septum: No defect or patent foramen ovale was identified by color flow Doppler. - Tricuspid valve: There was mild regurgitation. - Pulmonary arteries: PA peak pressure: 38 mm Hg (S).  Assessment/Plan:  1.Worsening lower extremity edema - known venous insufficiency - he has been found to be in AF. His situation seems to be worsening. I am concerned about what, if any involvement this could be from his rectal cancer. He did not have follow up scans in 2019. He is agreeable to CT scan. Will increase the Torsemide to 40 mg a day (2 pills). Same dose potassium til we see what his lab show. He is planning to go to [place] in 2 weeks for 11 days - "always a gamble" he says. He has trip insurance and has assistance. Will see what his Ct shows. Further disposition to follow.   2. Prior history of pericarditis - dates back to 2014 -no recurrence.  3. Prior PAF -now persistent - his original episode in the setting of acute pericarditis - no longer on Xarelto due to possibility that it was associated with pericardial effusion.CHADSVASC is at least a 3.He has been on high dose aspirin since this timedue to the reaction from Xarelto.   Now back in AF - rate is controlled - CHADSVASC is 3 and HASBLED is 3 - discussed with Dr. Lovena Le at prior visit - given his age and prior issue with Xarelto - will stay on high dose aspirin and not placed back on anticoagulation.  4. Prior rectal bleeding/rectal cancer-he is about 3 years out from this  diagnosis with no signs of recurrence.See above .  5. HTN -BPlooks ok today. .  Current medicines are reviewed with the patient today.  The patient does not have concerns regarding medicines other than what has been noted above.  The following changes have been made:  See above.  Labs/ tests ordered today include:    Orders Placed This Encounter  Procedures  . CT ABDOMEN PELVIS W CONTRAST  . Basic metabolic panel     Disposition:   Further disposition pending..   Patient is agreeable to this plan and will call if any problems develop in the interim.   SignedTruitt Merle, NP  11/01/2018 3:36 PM  [facility] [address] [place], [place]  [address] Phone: [phone] Fax: [phone]

## 2018-11-01 NOTE — Patient Instructions (Addendum)
We will be checking the following labs today - BMET   Medication Instructions:    Continue with your current medicines. BUT  I am increasing the Torsemide to 2 pills a day  Stay on the current dose of potassium for now    If you need a refill on your cardiac medications before your next appointment, please call your pharmacy.     Testing/Procedures To Be Arranged:  CT abdomen/pelvis with contrast for later this week  Follow-Up:   Let's see what the scan shows and then we will decide about follow up.     At [facility], you and your health needs are our priority.  As part of our continuing mission to provide you with exceptional heart care, we have created designated Provider Care Teams.  These Care Teams include your primary Cardiologist (physician) and Advanced Practice Providers (APPs -  Physician Assistants and Nurse Practitioners) who all work together to provide you with the care you need, when you need it.  Special Instructions:  . None  Call the [place] office at [phone] if you have any questions, problems or concerns.

## 2018-11-02 LAB — BASIC METABOLIC PANEL
BUN/Creatinine Ratio: 25 — ABNORMAL HIGH (ref 10–24)
BUN: 26 mg/dL (ref 10–36)
CO2: 26 mmol/L (ref 20–29)
Calcium: 9.4 mg/dL (ref 8.6–10.2)
Chloride: 98 mmol/L (ref 96–106)
Creatinine, Ser: 1.02 mg/dL (ref 0.76–1.27)
GFR calc Af Amer: 71 mL/min/{1.73_m2} (ref >59)
GFR calc non Af Amer: 62 mL/min/{1.73_m2} (ref >59)
Glucose: 94 mg/dL (ref 65–99)
Potassium: 4.9 mmol/L (ref 3.5–5.2)
Sodium: 139 mmol/L (ref 134–144)

## 2018-11-03 ENCOUNTER — Ambulatory Visit (INDEPENDENT_AMBULATORY_CARE_PROVIDER_SITE_OTHER)
Admission: RE | Admit: 2018-11-03 | Discharge: 2018-11-03 | Disposition: A | Discharge status: Home / Self Care | Payer: Medicare Other | Source: Ambulatory Visit | Attending: Nurse Practitioner | Admitting: Nurse Practitioner

## 2018-11-03 DIAGNOSIS — R1900 Intra-abdominal and pelvic swelling, mass and lump, unspecified site: Secondary | ICD-10-CM | POA: Diagnosis not present

## 2018-11-03 DIAGNOSIS — R609 Edema, unspecified: Secondary | ICD-10-CM

## 2018-11-03 DIAGNOSIS — C20 Malignant neoplasm of rectum: Secondary | ICD-10-CM | POA: Diagnosis not present

## 2018-11-03 MED ORDER — IOPAMIDOL (ISOVUE-300) INJECTION 61%
100.0000 mL | Freq: Once | INTRAVENOUS | Status: AC | PRN
  Administered 2018-11-03: 100 mL via INTRAVENOUS

## 2018-11-07 ENCOUNTER — Telehealth: Payer: Self-pay | Admitting: Hematology

## 2018-11-07 NOTE — Progress Notes (Signed)
[facility]   [phone] [phone]   Clinic Follow up Note   Patient Care Team: [name], MD as PCP - General (Family Medicine) Ronald Lobo, MD as Consulting Physician (Gastroenterology) Tania Ade, RN as Registered Nurse 11/08/2018  CHIEF COMPLAINT: F/u rectal cancer  SUMMARY OF ONCOLOGIC HISTORY: Oncology History    Rectal cancer [facility])   Staging form: Colon and Rectum, AJCC 7th Edition     Clinical stage from 08/08/2015: Stage IIIB (T3, N1, M0) - Unsigned       Rectal cancer ([place])   07/16/2015 Pathology Results    Biopsy positive for invasive adenocarcinoma with signet ring cell features    07/23/2015 Initial Diagnosis    Rectal cancer ([place])    08/12/2015 Concurrent Chemotherapy    Xeloda 1541m q12h on the day of radiation     08/12/2015 - 09/20/2015 Radiation Therapy    radiaiton to rectal cancer     02/18/2016 Procedure    FLEX SIGMOIDOSCOPY: Endoscopic disappearance of previous rectal mass; atrophic mucosa in distal rectum  per Dr. BCristina Gong   02/18/2016 Pathology Results    02/18/2016 Surgical Pathology Diagnosis Surgical [P], rectum - MILD GLANDULAR ARCHITECTURAL CHANGES AND REPARATIVE FEATURES. - NO ADENOMATOUS CHANGE OR MALIGNANCY IDENTIFIED.    02/20/2016 Pathology Results    No adenomatous change or malignancy    05/11/2017 Imaging    CT CAP IMPRESSION: 1. Newly enlarged 1.3 cm in short axis aortocaval node, while not entirely specific, is concerning for recurrent malignancy. No other indicators of recurrence identified. Biopsy or nuclear medicine PET-CT could be utilized to provide greater specificity if clinically warranted. 2. Continued abnormal wall thickening in the inferior rectum. Surrounding perirectal and presacral stranding likely from prior radiation therapy. 3. Prostatomegaly, prostate volume 93 cubic cm. 4. Other imaging findings of potential clinical significance: Aortic Atherosclerosis  (ICD10-I70.0). Coronary atherosclerosis with mild cardiomegaly. Markedly severe chronic arthropathy in the left hip. Suspected cyst in segment 6 of the liver, unchanged. Prominent stool throughout the colon favors constipation. Direct right inguinal hernia contains adipose tissue. Possible retraction of the left testicle. 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    07/28/2017 Imaging    CT AP W Contrast 07/28/17  IMPRESSION: 1. Residual rectal wall thickening, as before. Mildly enlarged aortocaval lymph node is stable. No additional evidence of metastatic disease. 2. Aortic atherosclerosis (ICD10-170.0). Three-vessel coronary artery calcification. 3. Enlarged prostate. 4. Advanced left hip osteoarthritis.    01/12/2018 Procedure     01/12/2018 Sigmoidoscopy Erythematous and febrile mucosa in the mid rectum consistent with prior chemoradiation. No evidence or local recurrence.     11/03/2018 Imaging    CT AP W Contrast  IMPRESSION: 1. Findings consistent with metastatic pelvic and retroperitoneal lymphadenopathy, which has developed/increased since the prior CT. This is presumably metastatic disease from rectal carcinoma. Retroperitoneal adenopathy partly compresses the inferior vena cava and may account for lower extremity edema. 2. There are also irregular nodules of the lung bases concerning for metastatic disease, new since the prior CT. 3. No other evidence of metastatic disease. 4. No acute findings. 5. Prostatic hypertrophy. 6. Aortic atherosclerosis.     CURRENT THERAPY  Observation   INTERVAL HISTORY: Nathaniel GOYERis a 85y.o. male who is here for follow-up. He has recently seen his cardiologist, Dr. GServando Snarefrequently due to worsening lower extremity edema. Dr. GServando Snarewas concerned about involvement from rectal cancer and ordered a CT scan. Today, he is here  alone and is doing well. His lower extremity edema is improving but it is still  severe. He denies stomach pain, low appetite, and has normal bowel movements.   Pertinent positives and negatives of review of systems are listed and detailed within the above HPI.  REVIEW OF SYSTEMS:   Constitutional: Denies fevers, chills or abnormal weight loss Eyes: Denies blurriness of vision Ears, nose, mouth, throat, and face: Denies mucositis or sore throat Respiratory: Denies cough, dyspnea or wheezes Cardiovascular: Denies palpitation, chest discomfort, (+) lower extremity swelling Gastrointestinal:  Denies nausea, heartburn or change in bowel habits Skin: Denies abnormal skin rashes Lymphatics: Denies new lymphadenopathy or easy bruising Neurological:Denies numbness, tingling or new weaknesses Behavioral/Psych: Mood is stable, no new changes  All other systems were reviewed with the patient and are negative.  MEDICAL HISTORY:  Past Medical History:  Diagnosis Date  . Anemia due to antineoplastic chemotherapy   . Atherosclerosis of coronary artery cardiologist-  dr Aundra Dubin   aorta and coronary arteries moderate per ct 02-07-2016  . Basal cell carcinoma    40+  . Bladder calculi   . BPH (benign prostatic hyperplasia)   . Calculi, urethra   . Chronic low back pain   . Constipation   . ED (erectile dysfunction)   . Edema of lower extremity   . Family history of melanoma   . Foley catheter in place   . History of adenomatous polyp of colon    2007  . History of basal cell carcinoma excision    multiple  . History of cellulitis    left lower leg-- resolved  . History of melanoma excision    temple right side/ multiple  . History of obstructive sleep apnea    per pt cured by hypnosis  . History of pericarditis    acute episode 09/ 2014  per cardiologist note , dr Aundra Dubin , related possibly to xarelto use  . History of ulcerative colitis    remote  . HTN (hypertension)   . Hypercholesteremia   . Hypothyroidism   . Incomplete right bundle branch block   . LAFB  (left anterior fascicular block)   . Melanoma ([place])   . OA (osteoarthritis)   . PAF (paroxysmal atrial fibrillation) [facility]) cardiologist-  dr dalton Aundra Dubin   dx 09/ 2014  in setting of acute pericarditis   . Rectal cancer [facility]) dx 07-16-2015  oncologist-  dr [name]   Stage IIIB (cT3, N1, M0)-- concurrent  chemotherapy started 08-12-2015/  radiation complete (08-12-2015 to 09-20-2015)  . S/P radiation therapy 08/12/15-09/20/15   rectal ca50.4Gy total dose  . Spinal stenosis     SURGICAL HISTORY: Past Surgical History:  Procedure Laterality Date  . CATARACT EXTRACTION W/ INTRAOCULAR LENS IMPLANT Bilateral 10/ 2016  . CYSTOSCOPY WITH LITHOLAPAXY N/A 02/12/2016   Procedure: CYSTOSCOPY WITH LITHOLAPAXY;  Surgeon: Alexis Frock, MD;  Location: [facility];  Service: Urology;  Laterality: N/A;  . EUS N/A 08/08/2015   Procedure: LOWER ENDOSCOPIC ULTRASOUND (EUS);  Surgeon: Arta Silence, MD;  Location[facility] ENDOSCOPY;  Service: Endoscopy;  Laterality: N/A;  . FLEXIBLE SIGMOIDOSCOPY N/A 08/08/2015   Procedure: FLEXIBLE SIGMOIDOSCOPY;  Surgeon: Arta Silence, MD;  Location: [facility] ENDOSCOPY;  Service: Endoscopy;  Laterality: N/A;  poor prep  . HOLMIUM LASER APPLICATION N/A 01/10/2129   Procedure: HOLMIUM LASER APPLICATION;  Surgeon: Alexis Frock, MD;  Location: [facility];  Service: Urology;  Laterality: N/A;  . INGUINAL HERNIA REPAIR Left 1965  . LEFT  KNEE SURGERY  1940  . MOHS SURGERY  x9  last one 2014   . ORIF LEFT ANKLE FX  1935  . ROTATOR CUFF REPAIR Bilateral right 2013/  left 2010  . TRANSTHORACIC ECHOCARDIOGRAM  06-06-2013   ef 55-60%/  mild AR/  mild dilated aortic root/  mild LAE and RAE/  mild to moderate TR/  small pericardial effusion identified    I have reviewed the social history and family history with the patient and they are unchanged from previous note.  ALLERGIES:  is allergic to cinnamon; ibuprofen; percocet [oxycodone-acetaminophen]; protonix  [pantoprazole sodium]; and xarelto [rivaroxaban].  MEDICATIONS:  Current Outpatient Medications  Medication Sig Dispense Refill  . aspirin EC 325 MG tablet Take 325 mg by mouth daily.     . finasteride (PROSCAR) 5 MG tablet Take 5 mg by mouth every evening. Dose is taken at bedtime    . levothyroxine (SYNTHROID) 112 MCG tablet Take 1 tablet (112 mcg total) by mouth daily before breakfast. 90 tablet 3  . metoprolol succinate (TOPROL-XL) 25 MG 24 hr tablet TAKE 1/2 TABLET BY MOUTH DAILY. 45 tablet 1  . Multiple Vitamin (MULTIVITAMIN WITH MINERALS) TABS tablet Take 1 tablet by mouth daily.    . potassium chloride (K-DUR) 10 MEQ tablet TAKE 1 TABLET BY MOUTH EVERY DAY 90 tablet 3  . torsemide (DEMADEX) 20 MG tablet Take 2 tablets (40 mg total) by mouth daily. 60 tablet 6   No current facility-administered medications for this visit.     PHYSICAL EXAMINATION: ECOG PERFORMANCE STATUS: 2 - Symptomatic, <50% confined to bed  Vitals:   11/08/18 1541  BP: (!) 127/55  Pulse: (!) 59  Resp: 18  Temp: (!) 97.5 F (36.4 C)  SpO2: 98%   Filed Weights   11/08/18 1541  Weight: 206 lb 4.8 oz (93.6 kg)    GENERAL:alert, no distress and comfortable SKIN: skin color, texture, turgor are normal, no rashes or significant lesions EYES: normal, Conjunctiva are pink and non-injected, sclera clear OROPHARYNX:no exudate, no erythema and lips, buccal mucosa, and tongue normal  NECK: supple, thyroid normal size, non-tender, without nodularity LYMPH:  no palpable lymphadenopathy in the cervical, axillary or inguinal LUNGS: clear to auscultation and percussion with normal breathing effort HEART: regular rate & rhythm and no murmurs, (+) lower extremity edema ABDOMEN:abdomen soft, non-tender and normal bowel sounds Musculoskeletal:no cyanosis of digits and no clubbing  NEURO: alert & oriented x 3 with fluent speech, no focal motor/sensory deficits EXT: (+) Bilateral pitting edema up to knee, with mild skin  hyperpigmentation  LABORATORY DATA:  I have reviewed the data as listed CBC Latest Ref Rng & Units 09/28/2018 06/02/2018 11/29/2017  WBC 3.4 - 10.8 x10E3/uL 6.5 4.3 3.9(L)  Hemoglobin 13.0 - 17.7 g/dL 13.5 13.4 13.7  Hematocrit 37.5 - 51.0 % 39.6 39.5 41.5  Platelets 150 - 450 x10E3/uL 252 201 185     CMP Latest Ref Rng & Units 11/01/2018 10/05/2018 09/28/2018  Glucose 65 - 99 mg/dL 94 92 87  BUN 10 - 36 mg/dL _0 Creatinine 0.76 - 1.27 mg/dL 1.02 1.06 0.92  Sodium 134 - 144 mmol/L 139 138 140  Potassium 3.5 - 5.2 mmol/L 4.9 4.8 4.7  Chloride 96 - 106 mmol/L 98 98 97  CO2 20 - 29 mmol/L _1 Calcium 8.6 - 10.2 mg/dL 9.4 9.4 9.4  Total Protein 6.0 - 8.5 g/dL - - 6.5  Total Bilirubin 0.0 - 1.2 mg/dL - - 0.5  Alkaline Phos 39 - 117 IU/L - - 101  AST 0 - 40 IU/L - - 30  ALT 0 - 44 IU/L - - 20      RADIOGRAPHIC STUDIES: I have personally reviewed the radiological images as listed and agreed with the findings in the report.  11/03/2018 CT AP W Contrast  IMPRESSION: 1. Findings consistent with metastatic pelvic and retroperitoneal lymphadenopathy, which has developed/increased since the prior CT. This is presumably metastatic disease from rectal carcinoma. Retroperitoneal adenopathy partly compresses the inferior vena cava and may account for lower extremity edema. 2. There are also irregular nodules of the lung bases concerning for metastatic disease, new since the prior CT. 3. No other evidence of metastatic disease. 4. No acute findings. 5. Prostatic hypertrophy. 6. Aortic atherosclerosis.   ASSESSMENT & PLAN:   DANDRA VELARDI is a 85 y.o. male with history of  1. 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 He is s/p concurrent chemotherapy and radiation. He opted not to have surgery due to his advanced age  -Due to his advanced age, I previously discussed that I may not offer  chemotherapy if he has disease recurrence.  -His genetic testing was negative - He recently went to his cardiologist due to complains of worsening lower extremity edema and had a CT scans done on 11/03/18. I reviewed his scan images myself and discussed with pt. Unfortunately , he has developed a significant abdominal and pelvic adenopathy, highly suspicious for metastatic disease.   I think his lymph nodes are not easy to biopsy. -I will order a PET scan for further evaluation and Doppler of bilateral LE to rule out DVT. -He is clinically do well, no pain or other new symptoms, except leg edema  -I discussed with his radiation oncologist Dr. Lisbeth Renshaw, he will see him back to discuss palliative RT to RP nodes to improve his leg edema  -I will check his tumor for MSI and FO, to see if he is a candidate for immunotherapy or targeted therapy  - F/u 12/01/2018    2. Worsening Lower extremity edema  - On Torsemide 40 mg daily  - F/u Cardiologist  -Possible related to RP adenopathy     3. HTN, AF, arthritis -F/u with PCP  4. History of skin cancer including melanoma  -F/u with Dermatologist    5. Low back pain -secondary to spinal stenosis and rectal pain - Resolved, after completion of chemoradiation   Plan -Lab and f/u in 12/01/18  -Lab and Doppler of bilateral LE next 1-2 days  -PET scan next week -Rad/onc referral  -will check his initial biopsy for MSI and FO    No problem-specific Assessment & Plan notes found for this encounter.   No orders of the defined types were placed in this encounter.  All questions were answered. The patient knows to call the clinic with any problems, questions or concerns. No barriers to learning was detected. I spent 20 minutes counseling the patient face to face. The total time spent in the appointment was 25 minutes and more than 50% was on counseling and review of test results  I, Manson Allan am acting as scribe for Dr. [name].  I have  reviewed the above documentation for accuracy and completeness, and I agree with the above.     [name], MD 11/08/2018

## 2018-11-07 NOTE — Telephone Encounter (Signed)
Scheduled appt per 3/2 sch message- pt aware of date and time

## 2018-11-08 ENCOUNTER — Inpatient Hospital Stay: Payer: Medicare Other | Attending: Hematology | Admitting: Hematology

## 2018-11-08 ENCOUNTER — Encounter: Payer: Self-pay | Admitting: Hematology

## 2018-11-08 VITALS — BP 127/55 | HR 59 | Temp 97.5°F | Resp 18 | Ht 73.0 in | Wt 206.3 lb

## 2018-11-08 DIAGNOSIS — C20 Malignant neoplasm of rectum: Secondary | ICD-10-CM | POA: Diagnosis not present

## 2018-11-08 DIAGNOSIS — I10 Essential (primary) hypertension: Secondary | ICD-10-CM | POA: Insufficient documentation

## 2018-11-08 DIAGNOSIS — I4891 Unspecified atrial fibrillation: Secondary | ICD-10-CM | POA: Diagnosis not present

## 2018-11-08 DIAGNOSIS — R600 Localized edema: Secondary | ICD-10-CM | POA: Diagnosis not present

## 2018-11-08 NOTE — Progress Notes (Signed)
Written and verbal instructions given to pt in the lobby re:   1.  Doppler study is scheduled for Thursday 11/10/2018  11 am at [facility].  Pt to register in admitting at 1030 am. 2.  Pt to come to cancer center for lab after doppler study same day. Pt voiced understanding.

## 2018-11-09 ENCOUNTER — Telehealth: Payer: Self-pay | Admitting: Hematology

## 2018-11-09 NOTE — Telephone Encounter (Signed)
Called patient and scheduled appts per 3/3 los.  Gave patient the number to call central radiology to schedule PET scan.  Patient aware of lab and doppler appt.

## 2018-11-10 ENCOUNTER — Inpatient Hospital Stay: Payer: Medicare Other

## 2018-11-10 ENCOUNTER — Ambulatory Visit (HOSPITAL_COMMUNITY)
Admission: RE | Admit: 2018-11-10 | Discharge: 2018-11-10 | Disposition: A | Discharge status: Home / Self Care | Payer: Medicare Other | Source: Ambulatory Visit | Attending: Hematology | Admitting: Hematology

## 2018-11-10 ENCOUNTER — Other Ambulatory Visit: Payer: Medicare Other

## 2018-11-10 DIAGNOSIS — I4891 Unspecified atrial fibrillation: Secondary | ICD-10-CM | POA: Diagnosis not present

## 2018-11-10 DIAGNOSIS — I10 Essential (primary) hypertension: Secondary | ICD-10-CM | POA: Diagnosis not present

## 2018-11-10 DIAGNOSIS — C20 Malignant neoplasm of rectum: Secondary | ICD-10-CM

## 2018-11-10 DIAGNOSIS — R600 Localized edema: Secondary | ICD-10-CM | POA: Diagnosis not present

## 2018-11-10 LAB — CBC WITH DIFFERENTIAL (CANCER CENTER ONLY)
Abs Immature Granulocytes: 0.02 10*3/uL (ref 0.00–0.07)
Basophils Absolute: 0.1 10*3/uL (ref 0.0–0.1)
Basophils Relative: 1 %
EOS ABS: 0.1 10*3/uL (ref 0.0–0.5)
Eosinophils Relative: 2 %
HCT: 36.8 % — ABNORMAL LOW (ref 39.0–52.0)
Hemoglobin: 11.9 g/dL — ABNORMAL LOW (ref 13.0–17.0)
Immature Granulocytes: 0 %
Lymphocytes Relative: 13 %
Lymphs Abs: 0.8 10*3/uL (ref 0.7–4.0)
MCH: 30.9 pg (ref 26.0–34.0)
MCHC: 32.3 g/dL (ref 30.0–36.0)
MCV: 95.6 fL (ref 80.0–100.0)
Monocytes Absolute: 1 10*3/uL (ref 0.1–1.0)
Monocytes Relative: 17 %
NRBC: 0 % (ref 0.0–0.2)
Neutro Abs: 3.9 10*3/uL (ref 1.7–7.7)
Neutrophils Relative %: 67 %
Platelet Count: 236 10*3/uL (ref 150–400)
RBC: 3.85 MIL/uL — ABNORMAL LOW (ref 4.22–5.81)
RDW: 13.7 % (ref 11.5–15.5)
WBC Count: 5.8 10*3/uL (ref 4.0–10.5)

## 2018-11-10 LAB — COMPREHENSIVE METABOLIC PANEL
ALT: 16 U/L (ref 0–44)
AST: 23 U/L (ref 15–41)
Albumin: 3.5 g/dL (ref 3.5–5.0)
Alkaline Phosphatase: 109 U/L (ref 38–126)
Anion gap: 9 (ref 5–15)
BUN: 24 mg/dL — ABNORMAL HIGH (ref 8–23)
CO2: 28 mmol/L (ref 22–32)
Calcium: 9.2 mg/dL (ref 8.9–10.3)
Chloride: 99 mmol/L (ref 98–111)
Creatinine, Ser: 1.19 mg/dL (ref 0.61–1.24)
GFR calc Af Amer: 59 mL/min — ABNORMAL LOW (ref >60)
GFR calc non Af Amer: 51 mL/min — ABNORMAL LOW (ref >60)
Glucose, Bld: 114 mg/dL — ABNORMAL HIGH (ref 70–99)
Potassium: 4.5 mmol/L (ref 3.5–5.1)
Sodium: 136 mmol/L (ref 135–145)
Total Bilirubin: 0.6 mg/dL (ref 0.3–1.2)
Total Protein: 6.8 g/dL (ref 6.5–8.1)

## 2018-11-10 LAB — CEA (IN HOUSE-CHCC): CEA (CHCC-In House): 5.61 ng/mL — ABNORMAL HIGH (ref 0.00–5.00)

## 2018-11-10 NOTE — Progress Notes (Signed)
Bilateral lower extremities venous duplex exam completed. Result called Dr. Burr Medico.  More details please see preliminary notes on CV PROC under chart review.  Jennea Rager H Marycatherine Maniscalco(RDMS RVT) 11/10/18 12:44 PM

## 2018-11-15 ENCOUNTER — Ambulatory Visit (HOSPITAL_COMMUNITY)
Admission: RE | Admit: 2018-11-15 | Discharge: 2018-11-15 | Disposition: A | Discharge status: Home / Self Care | Payer: Medicare Other | Source: Ambulatory Visit | Attending: Hematology | Admitting: Hematology

## 2018-11-15 DIAGNOSIS — C20 Malignant neoplasm of rectum: Secondary | ICD-10-CM | POA: Diagnosis not present

## 2018-11-15 LAB — GLUCOSE, CAPILLARY: GLUCOSE-CAPILLARY: 101 mg/dL — AB (ref 70–99)

## 2018-11-15 MED ORDER — FLUDEOXYGLUCOSE F - 18 (FDG) INJECTION
9.9000 | Freq: Once | INTRAVENOUS | Status: AC
  Administered 2018-11-15: 9.9 via INTRAVENOUS

## 2018-11-16 ENCOUNTER — Ambulatory Visit (HOSPITAL_COMMUNITY): Payer: Medicare Other

## 2018-11-16 DIAGNOSIS — C20 Malignant neoplasm of rectum: Secondary | ICD-10-CM | POA: Diagnosis not present

## 2018-12-01 ENCOUNTER — Ambulatory Visit: Payer: Medicare Other | Admitting: Hematology

## 2018-12-01 ENCOUNTER — Other Ambulatory Visit: Payer: Medicare Other

## 2018-12-06 ENCOUNTER — Other Ambulatory Visit: Payer: Self-pay | Admitting: Radiation Oncology

## 2018-12-06 ENCOUNTER — Encounter: Payer: Self-pay | Admitting: Radiation Oncology

## 2018-12-06 ENCOUNTER — Telehealth: Payer: Self-pay | Admitting: Radiation Oncology

## 2018-12-06 ENCOUNTER — Other Ambulatory Visit: Payer: Self-pay

## 2018-12-06 ENCOUNTER — Ambulatory Visit
Admission: RE | Admit: 2018-12-06 | Discharge: 2018-12-06 | Disposition: A | Discharge status: Home / Self Care | Payer: Medicare Other | Source: Ambulatory Visit | Attending: Radiation Oncology | Admitting: Radiation Oncology

## 2018-12-06 DIAGNOSIS — Z8249 Family history of ischemic heart disease and other diseases of the circulatory system: Secondary | ICD-10-CM | POA: Diagnosis not present

## 2018-12-06 DIAGNOSIS — E039 Hypothyroidism, unspecified: Secondary | ICD-10-CM | POA: Diagnosis not present

## 2018-12-06 DIAGNOSIS — Z8049 Family history of malignant neoplasm of other genital organs: Secondary | ICD-10-CM | POA: Insufficient documentation

## 2018-12-06 DIAGNOSIS — Z800 Family history of malignant neoplasm of digestive organs: Secondary | ICD-10-CM | POA: Diagnosis not present

## 2018-12-06 DIAGNOSIS — C778 Secondary and unspecified malignant neoplasm of lymph nodes of multiple regions: Secondary | ICD-10-CM | POA: Diagnosis not present

## 2018-12-06 DIAGNOSIS — Z79890 Hormone replacement therapy (postmenopausal): Secondary | ICD-10-CM | POA: Diagnosis not present

## 2018-12-06 DIAGNOSIS — I4510 Unspecified right bundle-branch block: Secondary | ICD-10-CM | POA: Insufficient documentation

## 2018-12-06 DIAGNOSIS — Z886 Allergy status to analgesic agent status: Secondary | ICD-10-CM | POA: Insufficient documentation

## 2018-12-06 DIAGNOSIS — Z808 Family history of malignant neoplasm of other organs or systems: Secondary | ICD-10-CM | POA: Diagnosis not present

## 2018-12-06 DIAGNOSIS — Z85820 Personal history of malignant melanoma of skin: Secondary | ICD-10-CM | POA: Insufficient documentation

## 2018-12-06 DIAGNOSIS — I10 Essential (primary) hypertension: Secondary | ICD-10-CM | POA: Insufficient documentation

## 2018-12-06 DIAGNOSIS — I444 Left anterior fascicular block: Secondary | ICD-10-CM | POA: Insufficient documentation

## 2018-12-06 DIAGNOSIS — I480 Paroxysmal atrial fibrillation: Secondary | ICD-10-CM | POA: Insufficient documentation

## 2018-12-06 DIAGNOSIS — G8929 Other chronic pain: Secondary | ICD-10-CM | POA: Insufficient documentation

## 2018-12-06 DIAGNOSIS — C785 Secondary malignant neoplasm of large intestine and rectum: Secondary | ICD-10-CM | POA: Diagnosis not present

## 2018-12-06 DIAGNOSIS — Z7982 Long term (current) use of aspirin: Secondary | ICD-10-CM | POA: Insufficient documentation

## 2018-12-06 DIAGNOSIS — Z79899 Other long term (current) drug therapy: Secondary | ICD-10-CM | POA: Diagnosis not present

## 2018-12-06 DIAGNOSIS — M4800 Spinal stenosis, site unspecified: Secondary | ICD-10-CM | POA: Diagnosis not present

## 2018-12-06 DIAGNOSIS — N400 Enlarged prostate without lower urinary tract symptoms: Secondary | ICD-10-CM | POA: Insufficient documentation

## 2018-12-06 DIAGNOSIS — M1990 Unspecified osteoarthritis, unspecified site: Secondary | ICD-10-CM | POA: Diagnosis not present

## 2018-12-06 DIAGNOSIS — C20 Malignant neoplasm of rectum: Secondary | ICD-10-CM

## 2018-12-06 DIAGNOSIS — Z923 Personal history of irradiation: Secondary | ICD-10-CM | POA: Diagnosis not present

## 2018-12-06 DIAGNOSIS — I2510 Atherosclerotic heart disease of native coronary artery without angina pectoris: Secondary | ICD-10-CM | POA: Diagnosis not present

## 2018-12-06 DIAGNOSIS — Z91018 Allergy to other foods: Secondary | ICD-10-CM | POA: Insufficient documentation

## 2018-12-06 DIAGNOSIS — Z885 Allergy status to narcotic agent status: Secondary | ICD-10-CM | POA: Diagnosis not present

## 2018-12-06 NOTE — Progress Notes (Addendum)
Radiation Oncology         [phone] ________________________________  Name: Nathaniel [name] MRN: [id number]  Date: 12/06/2018  DOB: 1919-12-09  Reconsultation Note  CC: [name], MD  [name], MD  Diagnosis:   Recurrent Metastatic Stage IIIB, cT3N1M0 adenocarcinoma of the distal rectum.  Interval Since Last Radiation:  3 years, two months  08/12/15-09/17/15: 50.4 Gy total with 45 Gy to the rectum and a 5.4 Gy boost  Narrative: Nathaniel [name] is a pleasant  85 y.o. gentleman who was treated just over 3 years ago for a stage III rectal cancer.  He tolerated his therapy at that time with concurrent chemoradiation, but did not undergo surgical resection due to his age and concerns for recovery.  He has been followed in surveillance and was recently found to have increasing lower extremity edema.  A CT of the abdomen and pelvis on 11/03/2018 by his heart provider revealed concerns for metastatic adenopathy in the retroperitoneum and concern for irregular nodules in the lung bases.  He returned back to see Dr. Burr Medico and a PET scan on 11/15/2018 was ordered.  He had PET positive iliac and retroperitoneal adenopathy consistent with his prior cancer, and irregular nodules within the right left and lung also suspicious for disease.  No other areas of metastatic disease were noted.  She is awaiting additional studies to consider immunotherapy as a systemic agent, and recommended he meet with [place] to discuss options of palliative radiotherapy given his edema.  He is seen via well teleconference with WebEx with the assistance of 1 of the administrators at [employer] where he resides.             On review of systems, the patient reports that he is doing well overall. He reports edema that is tight and uncomfortable in his right greater than left lower extremities. He denies any chest pain, shortness of breath, cough, fevers, chills, night sweats, unintended weight changes. He denies any  bowel or bladder disturbances, and denies abdominal pain, nausea or vomiting. He denies any new musculoskeletal or joint aches or pains, new skin lesions or concerns. A complete review of systems is obtained and is otherwise negative.   Past Medical History:  Past Medical History:  Diagnosis Date   Anemia due to antineoplastic chemotherapy    Atherosclerosis of coronary artery cardiologist-  dr Aundra Dubin   aorta and coronary arteries moderate per ct 02-07-2016   Basal cell carcinoma    40+   Bladder calculi    BPH (benign prostatic hyperplasia)    Calculi, urethra    Chronic low back pain    Constipation    ED (erectile dysfunction)    Edema of lower extremity    Family history of melanoma    Foley catheter in place    History of adenomatous polyp of colon    2007   History of basal cell carcinoma excision    multiple   History of cellulitis    left lower leg-- resolved   History of melanoma excision    temple right side/ multiple   History of obstructive sleep apnea    per pt cured by hypnosis   History of pericarditis    acute episode 09/ 2014  per cardiologist note , dr Aundra Dubin , related possibly to xarelto use   History of ulcerative colitis    remote   HTN (hypertension)    Hypercholesteremia    Hypothyroidism    Incomplete right bundle branch  block    LAFB (left anterior fascicular block)    Melanoma (HCC)    OA (osteoarthritis)    PAF (paroxysmal atrial fibrillation) [facility]) cardiologist-  dr dalton Aundra Dubin   dx 09/ 2014  in setting of acute pericarditis    Rectal cancer [facility]) dx 07-16-2015  oncologist-  dr Krista Blue feng   Stage IIIB (cT3, N1, M0)-- concurrent  chemotherapy started 08-12-2015/  radiation complete (08-12-2015 to 09-20-2015)   S/P radiation therapy 08/12/15-09/20/15   rectal ca50.4Gy total dose   Spinal stenosis     Past Surgical History: Past Surgical History:  Procedure Laterality Date   CATARACT EXTRACTION W/ INTRAOCULAR  LENS IMPLANT Bilateral 10/ 2016   CYSTOSCOPY WITH LITHOLAPAXY N/A 02/12/2016   Procedure: CYSTOSCOPY WITH LITHOLAPAXY;  Surgeon: Alexis Frock, MD;  Location: [facility];  Service: Urology;  Laterality: N/A;   EUS N/A 08/08/2015   Procedure: LOWER ENDOSCOPIC ULTRASOUND (EUS);  Surgeon: Arta Silence, MD;  Location: [facility] ENDOSCOPY;  Service: Endoscopy;  Laterality: N/A;   FLEXIBLE SIGMOIDOSCOPY N/A 08/08/2015   Procedure: FLEXIBLE SIGMOIDOSCOPY;  Surgeon: Arta Silence, MD;  Location: [facility] ENDOSCOPY;  Service: Endoscopy;  Laterality: N/A;  poor prep   HOLMIUM LASER APPLICATION N/A 05/15/3531   Procedure: HOLMIUM LASER APPLICATION;  Surgeon: Alexis Frock, MD;  Location: [facility];  Service: Urology;  Laterality: N/A;   INGUINAL HERNIA REPAIR Left 1965   LEFT KNEE SURGERY  1940   MOHS SURGERY  x9  last one 2014    ORIF LEFT ANKLE FX  1935   ROTATOR CUFF REPAIR Bilateral right 2013/  left 2010   TRANSTHORACIC ECHOCARDIOGRAM  06-06-2013   ef 55-60%/  mild AR/  mild dilated aortic root/  mild LAE and RAE/  mild to moderate TR/  small pericardial effusion identified    Social History:  Social History   Socioeconomic History   Marital status: Widowed    Spouse name: Not on file   Number of children: Not on file   Years of education: Not on file   Highest education level: Not on file  Occupational History   Not on file  Social Needs   Financial resource strain: Not on file   Food insecurity:    Worry: Not on file    Inability: Not on file   Transportation needs:    Medical: Not on file    Non-medical: Not on file  Tobacco Use   Smoking status: Never Smoker   Smokeless tobacco: Never Used  Substance and Sexual Activity   Alcohol use: Yes    Alcohol/week: 1.0 standard drinks    Types: 1 Glasses of wine per week    Comment: 1 glass of wine per week   Drug use: No   Sexual activity: Not on file  Lifestyle   Physical activity:     Days per week: Not on file    Minutes per session: Not on file   Stress: Not on file  Relationships   Social connections:    Talks on phone: Not on file    Gets together: Not on file    Attends religious service: Not on file    Active member of club or organization: Not on file    Attends meetings of clubs or organizations: Not on file    Relationship status: Not on file   Intimate partner violence:    Fear of current or ex partner: Not on file    Emotionally abused: Not on file  Physically abused: Not on file    Forced sexual activity: Not on file  Other Topics Concern   Not on file  Social History Narrative   Widower X 2   Lives in independent apartment at [address] [employer] drives, goes to water aerobics 3/week   Has #4 daughters and #2 sons and #5 stepchildren   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 perhaps)   Cancer Daughter        thyroid   Cancer - Cervical Daughter        Parotid gland    ALLERGIES:  is allergic to cinnamon; ibuprofen; percocet [oxycodone-acetaminophen]; protonix [pantoprazole sodium]; and xarelto [rivaroxaban].  Meds: Current Outpatient Medications  Medication Sig Dispense Refill   aspirin EC 325 MG tablet Take 325 mg by mouth daily.      finasteride (PROSCAR) 5 MG tablet Take 5 mg by mouth every evening. Dose is taken at bedtime     levothyroxine (SYNTHROID) 112 MCG tablet Take 1 tablet (112 mcg total) by mouth daily before breakfast. 90 tablet 3   metoprolol  succinate (TOPROL-XL) 25 MG 24 hr tablet TAKE 1/2 TABLET BY MOUTH DAILY. 45 tablet 1   Multiple Vitamin (MULTIVITAMIN WITH MINERALS) TABS tablet Take 1 tablet by mouth daily.     potassium chloride (K-DUR) 10 MEQ tablet TAKE 1 TABLET BY MOUTH EVERY DAY 90 tablet 3   torsemide (DEMADEX) 20 MG tablet Take 2 tablets (40 mg total) by mouth daily. 60 tablet 6   No current facility-administered medications for this encounter.     Physical Findings:  vitals were not taken for this visit. .   In general this is a well appearing though elderly Caucasian male in no acute distress. He is alert and oriented x4 and appropriate throughout the examination. HEENT reveals that the patient is normocephalic, atraumatic. EOMs are intact.Skin is intact without any evidence of gross lesions.  Cardiopulmonary assessment is negative for acute distress and he exhibits normal effort. Through our Webex platform I can tell that he has right lower extremity edema despite his compression hose. It is noted from the dorsum of the foot to the level of the right knee.  Lab Findings: Lab Results  Component Value Date   WBC 5.8 11/10/2018   HGB 11.9 (L) 11/10/2018   HCT 36.8 (L) 11/10/2018   MCV 95.6 11/10/2018   PLT 236 11/10/2018     Radiographic Findings: Nm Pet Image Restag (ps) Skull Base To Thigh  Result Date: 11/15/2018 CLINICAL DATA:  Subsequent treatment strategy for rectal carcinoma. EXAM: NUCLEAR MEDICINE PET SKULL BASE TO THIGH TECHNIQUE: One hundred one mCi F-18 FDG was injected intravenously. Full-ring PET imaging was performed from the skull base to thigh after the radiotracer. CT data was obtained and used for attenuation correction and anatomic localization. Fasting blood glucose: 9.9 mg/dl COMPARISON:  CT 08/06/2015 FINDINGS: Mediastinal blood pool activity: SUV max 1.56 NECK: No hypermetabolic lymph nodes in the  neck. Incidental CT findings: none CHEST: Bilateral irregular pulmonary nodules. Example  elongated smudgy nodule in the RIGHT upper lobe measures 11 mm. Irregular 19 mm nodule along the RIGHT oblique fissure (image 74/4). These nodules have low but measurable metabolic activity (SUV max equal 1.6). Similar nodule in the LEFT lung along the fissure on image 63/4 Incidental CT findings: Coronary artery calcification and aortic atherosclerotic calcification. ABDOMEN/PELVIS: Retroperitoneal and iliac adenopathy has mild metabolic activity. There is scattered calcifications within this a new adenopathy. Most intense node is along the LEFT external iliac artery measuring 2.5 cm with SUV max equal 4.6. Nodular thickening in the LEFT operator space with speckled calcification measuring 2.3 cm short axis with SUV max equal 3.5. Lymphadenopathy in the retroperitoneum on either side of the aorta at the level the renal veins with SUV max equal 3.5. These nodes measure up to 3 cm in conglomerate (image 134/4. No focal abnormal activity in the liver. Adrenal glands normal. No focal abnormal activity associated with bowel. There is a focus of uptake along the descending colon which is favored diverticular inflammation. Incidental CT findings: None SKELETON: No focal hypermetabolic activity to suggest skeletal metastasis. Incidental CT findings: none IMPRESSION: 1. Relatively mild metabolic activity associated with the iliac adenopathy and retroperitoneal periaortic adenopathy is consistent with mucinous (signet cell) rectal adenocarcinoma which has relatively low metabolic activity. 2. Irregular smudgy nodules within LEFT and RIGHT lung with mild metabolic activity are concerning for rectal carcinoma metastasis to the lungs. 3. No liver metastasis. Electronically Signed   By: Suzy Bouchard M.D.   On: 11/15/2018 14:37   Vas [place] Lower Extremity Venous (dvt)  Result Date: 11/10/2018  Lower Venous Study Indications: Swelling, Edema, ulceration, Erythema, and rule out DVT.  Risk Factors: Radiation therapy Cancer  history of Basal cell carcinoma. Limitations: Body habitus and Hardening of skin and pain from ulceration; Severe pitting edema. Comparison Study: LLEV study on 01/21/2016 Performing Technologist: Rudell Cobb  Examination Guidelines: A complete evaluation includes B-mode imaging, spectral Doppler, color Doppler, and power Doppler as needed of all accessible portions of each vessel. Bilateral testing is considered an integral part of a complete examination. Limited examinations for reoccurring indications may be performed as noted.  Right Venous Findings: +---------+---------------+---------+-----------+----------------+-------------+            Compressibility Phasicity Spontaneity Properties       Summary        +---------+---------------+---------+-----------+----------------+-------------+  CFV       Full            No        Yes                                         +---------+---------------+---------+-----------+----------------+-------------+  SFJ       Full                                                                  +---------+---------------+---------+-----------+----------------+-------------+  FV Prox   Full            Yes       Yes  thickened                                                                        valves         +---------+---------------+---------+-----------+----------------+-------------+  FV Mid    Full                                                                  +---------+---------------+---------+-----------+----------------+-------------+  FV Distal Partial         Yes       Yes         partially                                                                        re-cannalized                   +---------+---------------+---------+-----------+----------------+-------------+  PFV                                                              Not                                                                              visualized      +---------+---------------+---------+-----------+----------------+-------------+  POP       Full            Yes       Yes                                         +---------+---------------+---------+-----------+----------------+-------------+  GSV       Full                                                                  +---------+---------------+---------+-----------+----------------+-------------+ ---The distal femoral vein: very difficult to compress due to patient body habitus and severe hardening of skin with swelling. However, it demonstrates patency with doppler. Per patient states it is the place  has been painful and bothered him. ---Cystic struction seen at popliteal fossa medially with internal echoes as well as peripheral vascularity, measuring 2.70x3.99x1.18cm.  Left Venous Findings: +---------+---------------+---------+-----------+----------+--------------+            Compressibility Phasicity Spontaneity Properties Summary         +---------+---------------+---------+-----------+----------+--------------+  CFV       Full            Yes       Yes                                    +---------+---------------+---------+-----------+----------+--------------+  SFJ       Full                                                             +---------+---------------+---------+-----------+----------+--------------+  FV Prox   Full                                                             +---------+---------------+---------+-----------+----------+--------------+  FV Mid    Full                                                             +---------+---------------+---------+-----------+----------+--------------+  FV Distal Full                                                             +---------+---------------+---------+-----------+----------+--------------+  PFV                                                        Not visualized  +---------+---------------+---------+-----------+----------+--------------+   POP       Full            Yes       Yes                                    +---------+---------------+---------+-----------+----------+--------------+  PTV                                                        Not visualized  +---------+---------------+---------+-----------+----------+--------------+  PERO  Not visualized  +---------+---------------+---------+-----------+----------+--------------+    Summary: Right: There is no evidence of deep vein thrombosis in the lower extremity. However, portions of this examination were limited- see technologist comments above. A cystic structure is found in the popliteal fossa. ---The distal femoral vein: very difficult to compress due to patient body habitus and severe hardening of skin with swelling. However, it demonstrates patency with doppler. Per patient states it is the place has been painful and bothered him. ---Cystic struction seen at popliteal fossa medially with internal echoes as well as peripheral vascularity, measuring 2.70x3.99x1.18cm.  Left: There is no evidence of deep vein thrombosis in the lower extremity. However, portions of this examination were limited- see technologist comments above. No cystic structure found in the popliteal fossa.  *See table(s) above for measurements and observations. Electronically signed by Servando Snare MD on 11/10/2018 at 5:52:52 PM.    Final     Impression/Plan: 1. Recurrent Metastatic Stage IIIB, cT3N1M0 adenocarcinoma of the distal rectum.  Dr. Lisbeth Renshaw discusses the findings and work-up thus far.  He reviews with the patient the rationale to consider palliative radiotherapy to alleviate his symptoms of edema, and we also discussed the rationale to consider systemic therapy with Dr. Renaee Munda.  He would like to meet with Dr. Burr Medico later this week to decide how he should move forward.  We did discuss an option of 2 weeks of palliative radiotherapy versus 3 weeks if he were  getting concurrent systemic treatment.  He is in agreement with this plan.  We discussed the risks, benefits, short and long-term effects of radiotherapy, and he would be willing to consider this but would like to further discuss this with his primary oncologist. 2. Spinal stenosis.  Patient does have concerns about being able to lay flat on the treatment table for his therapy if he were to pursue this.  Last time he did require a prescription of Norco to take this prior to coming in to be able to comfortably lay for his therapy.  We would consider this again if needed.  This encounter was provided by telemedicine platform Webex.  The patient has given verbal consent for this type of encounter and has been advised to only accept a meeting of this type in a secure network environment. The time spent during this encounter was 45 minutes. The attendants for this meeting include Dr. Lisbeth Renshaw,  Hayden Pedro ,  and [name]. Nathaniel [name] was also present.  During the encounter, Dr. Lisbeth Renshaw and  Hayden Pedro were located at [facility] Radiation Oncology Department.  [name] and Nathaniel [name] were located at the patient's home.    In a visit lasting 45 minutes, greater than 50% of the time was spent face to face discussing his course and coordinating his care.  The above documentation reflects my direct findings during this shared patient visit. Please see the separate note by Dr. Lisbeth Renshaw on this date for the remainder of the patient's plan of care.     Carola Rhine, PAC

## 2018-12-06 NOTE — Telephone Encounter (Signed)
I called and tried to reach the patient but his VM box is full.

## 2018-12-08 ENCOUNTER — Inpatient Hospital Stay: Payer: Medicare Other | Attending: Hematology | Admitting: Hematology

## 2018-12-08 ENCOUNTER — Encounter (HOSPITAL_COMMUNITY): Payer: Self-pay | Admitting: Hematology

## 2018-12-08 ENCOUNTER — Inpatient Hospital Stay: Payer: Medicare Other

## 2018-12-08 ENCOUNTER — Telehealth: Payer: Self-pay | Admitting: Hematology

## 2018-12-08 ENCOUNTER — Inpatient Hospital Stay: Payer: Medicare Other | Admitting: Hematology

## 2018-12-08 ENCOUNTER — Telehealth: Payer: Self-pay

## 2018-12-08 DIAGNOSIS — C210 Malignant neoplasm of anus, unspecified: Secondary | ICD-10-CM | POA: Diagnosis not present

## 2018-12-08 NOTE — Telephone Encounter (Signed)
No los per 4/2 °

## 2018-12-08 NOTE — Telephone Encounter (Signed)
Spoke with patient per Dr. Burr Medico offered him phone visit today inside of coming in due to COVID-19, per Dr. Burr Medico okay to skip labs this time, patient agreed to phone visit, changed on schedule.

## 2018-12-08 NOTE — Progress Notes (Signed)
[facility]   [phone] [phone]   Clinic Follow up Note   Patient Care Team: [name], MD as PCP - General (Family Medicine) Ronald Lobo, MD as Consulting Physician (Gastroenterology) Tania Ade, RN as Registered Nurse   I connected with Nathaniel [name] on 12/08/2018 at 11:30 AM EDT by telephone and verified that I am speaking with the correct person using two identifiers.   I discussed the limitations, risks, security and privacy concerns of performing an evaluation and management service by telephone and the availability of in person appointments. I also discussed with the patient that there may be a patient responsible charge related to this service. The patient expressed understanding and agreed to proceed.    CHIEF COMPLAINT: F/u rectal cancer  SUMMARY OF ONCOLOGIC HISTORY: Oncology History    Rectal cancer Nathaniel [name])   Staging form: Colon and Rectum, AJCC 7th Edition     Clinical stage from 08/08/2015: Stage IIIB (T3, N1, M0) - Unsigned       Rectal cancer (Nathaniel [name])   07/16/2015 Pathology Results    Biopsy positive for invasive adenocarcinoma with signet ring cell features    07/23/2015 Initial Diagnosis    Rectal cancer (Nathaniel [name])    08/12/2015 Concurrent Chemotherapy    Xeloda '1500mg'$  q12h on the day of radiation     08/12/2015 - 09/20/2015 Radiation Therapy    radiaiton to rectal cancer     02/18/2016 Procedure    FLEX SIGMOIDOSCOPY: Endoscopic disappearance of previous rectal mass; atrophic mucosa in distal rectum  per Dr. Cristina Gong    02/18/2016 Pathology Results    02/18/2016 Surgical Pathology Diagnosis Surgical [P], rectum - MILD GLANDULAR ARCHITECTURAL CHANGES AND REPARATIVE FEATURES. - NO ADENOMATOUS CHANGE OR MALIGNANCY IDENTIFIED.    02/20/2016 Pathology Results    No adenomatous change or malignancy    05/11/2017 Imaging    CT CAP IMPRESSION: 1. Newly enlarged 1.3 cm in short axis aortocaval node, while not entirely  specific, is concerning for recurrent malignancy. No other indicators of recurrence identified. Biopsy or nuclear medicine PET-CT could be utilized to provide greater specificity if clinically warranted. 2. Continued abnormal wall thickening in the inferior rectum. Surrounding perirectal and presacral stranding likely from prior radiation therapy. 3. Prostatomegaly, prostate volume 93 cubic cm. 4. Other imaging findings of potential clinical significance: Aortic Atherosclerosis (ICD10-I70.0). Coronary atherosclerosis with mild cardiomegaly. Markedly severe chronic arthropathy in the left hip. Suspected cyst in segment 6 of the liver, unchanged. Prominent stool throughout the colon favors constipation. Direct right inguinal hernia contains adipose tissue. Possible retraction of the left testicle. 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    07/28/2017 Imaging    CT AP W Contrast 07/28/17  IMPRESSION: 1. Residual rectal wall thickening, as before. Mildly enlarged aortocaval lymph node is stable. No additional evidence of metastatic disease. 2. Aortic atherosclerosis (ICD10-170.0). Three-vessel coronary artery calcification. 3. Enlarged prostate. 4. Advanced left hip osteoarthritis.    01/12/2018 Procedure     01/12/2018 Sigmoidoscopy Erythematous and febrile mucosa in the mid rectum consistent with prior chemoradiation. No evidence or local recurrence.     11/03/2018 Imaging    CT AP W Contrast  IMPRESSION: 1. Findings consistent with metastatic pelvic and retroperitoneal lymphadenopathy, which has developed/increased since the prior CT. This is presumably metastatic disease from rectal carcinoma. Retroperitoneal adenopathy partly compresses the inferior vena cava and may account for lower extremity edema. 2. There are also irregular nodules of the lung bases concerning  for metastatic disease, new since the prior CT. 3. No other evidence of metastatic disease. 4. No  acute findings. 5. Prostatic hypertrophy. 6. Aortic atherosclerosis.    11/15/2018 Imaging    PET  IMPRESSION: 1. Relatively mild metabolic activity associated with the iliac adenopathy and retroperitoneal periaortic adenopathy is consistent with mucinous (signet cell) rectal adenocarcinoma which has relatively low metabolic activity. 2. Irregular smudgy nodules within LEFT and RIGHT lung with mild metabolic activity are concerning for rectal carcinoma metastasis to the lungs. 3. No liver metastasis.       CURRENT THERAPY:  Observation  INTERVAL HISTORY:  Nathaniel [name] is here for a follow up of rectal cancer. He was able to identify himself by birthdate.  He notes he is doing well and stable. He is inclined to immunotherapy before chemo. He would like to save radiation for last.    REVIEW OF SYSTEMS:   Constitutional: Denies fevers, chills or abnormal weight loss Eyes: Denies blurriness of vision Ears, nose, mouth, throat, and face: Denies mucositis or sore throat Respiratory: Denies cough, dyspnea or wheezes Cardiovascular: Denies palpitation, chest discomfort or lower extremity swelling Gastrointestinal:  Denies nausea, heartburn or change in bowel habits Skin: Denies abnormal skin rashes Lymphatics: Denies new lymphadenopathy or easy bruising Neurological:Denies numbness, tingling or new weaknesses Behavioral/Psych: Mood is stable, no new changes  All other systems were reviewed with the patient and are negative.  MEDICAL HISTORY:  Past Medical History:  Diagnosis Date  . Anemia due to antineoplastic chemotherapy   . Atherosclerosis of coronary artery cardiologist-  dr Aundra Dubin   aorta and coronary arteries moderate per ct 02-07-2016  . Basal cell carcinoma    40+  . Bladder calculi   . BPH (benign prostatic hyperplasia)   . Calculi, urethra   . Chronic low back pain   . Constipation   . ED (erectile dysfunction)   . Edema of lower extremity   . Family  history of melanoma   . Foley catheter in place   . History of adenomatous polyp of colon    2007  . History of basal cell carcinoma excision    multiple  . History of cellulitis    left lower leg-- resolved  . History of melanoma excision    temple right side/ multiple  . History of obstructive sleep apnea    per pt cured by hypnosis  . History of pericarditis    acute episode 09/ 2014  per cardiologist note , dr Aundra Dubin , related possibly to xarelto use  . History of ulcerative colitis    remote  . HTN (hypertension)   . Hypercholesteremia   . Hypothyroidism   . Incomplete right bundle branch block   . LAFB (left anterior fascicular block)   . Melanoma ([place])   . OA (osteoarthritis)   . PAF (paroxysmal atrial fibrillation) [facility]) cardiologist-  dr dalton Aundra Dubin   dx 09/ 2014  in setting of acute pericarditis   . Rectal cancer [name]) dx 07-16-2015  oncologist-  dr [name]   Stage IIIB (cT3, N1, M0)-- concurrent  chemotherapy started 08-12-2015/  radiation complete (08-12-2015 to 09-20-2015)  . S/P radiation therapy 08/12/15-09/20/15   rectal ca50.4Gy total dose  . Spinal stenosis     SURGICAL HISTORY: Past Surgical History:  Procedure Laterality Date  . CATARACT EXTRACTION W/ INTRAOCULAR LENS IMPLANT Bilateral 10/ 2016  . CYSTOSCOPY WITH LITHOLAPAXY N/A 02/12/2016   Procedure: CYSTOSCOPY WITH LITHOLAPAXY;  Surgeon: Alexis Frock, MD;  Location: [facility]  [place];  Service: Urology;  Laterality: N/A;  . EUS N/A 08/08/2015   Procedure: LOWER ENDOSCOPIC ULTRASOUND (EUS);  Surgeon: Arta Silence, MD;  Location: [facility] ENDOSCOPY;  Service: Endoscopy;  Laterality: N/A;  . FLEXIBLE SIGMOIDOSCOPY N/A 08/08/2015   Procedure: FLEXIBLE SIGMOIDOSCOPY;  Surgeon: Arta Silence, MD;  Location: [name] ENDOSCOPY;  Service: Endoscopy;  Laterality: N/A;  poor prep  . HOLMIUM LASER APPLICATION N/A 11/11/4678   Procedure: HOLMIUM LASER APPLICATION;  Surgeon: Alexis Frock, MD;  Location: [name];  Service: Urology;  Laterality: N/A;  . INGUINAL HERNIA REPAIR Left 1965  . LEFT KNEE SURGERY  1940  . MOHS SURGERY  x9  last one 2014   . ORIF LEFT ANKLE FX  1935  . ROTATOR CUFF REPAIR Bilateral right 2013/  left 2010  . TRANSTHORACIC ECHOCARDIOGRAM  06-06-2013   ef 55-60%/  mild AR/  mild dilated aortic root/  mild LAE and RAE/  mild to moderate TR/  small pericardial effusion identified    I have reviewed the social history and family history with the patient and they are unchanged from previous note.  ALLERGIES:  is allergic to cinnamon; ibuprofen; percocet [oxycodone-acetaminophen]; protonix [pantoprazole sodium]; and xarelto [rivaroxaban].  MEDICATIONS:  Current Outpatient Medications  Medication Sig Dispense Refill  . aspirin EC 325 MG tablet Take 325 mg by mouth daily.     . finasteride (PROSCAR) 5 MG tablet Take 5 mg by mouth every evening. Dose is taken at bedtime    . levothyroxine (SYNTHROID) 112 MCG tablet Take 1 tablet (112 mcg total) by mouth daily before breakfast. 90 tablet 3  . metoprolol succinate (TOPROL-XL) 25 MG 24 hr tablet TAKE 1/2 TABLET BY MOUTH DAILY. 45 tablet 1  . Multiple Vitamin (MULTIVITAMIN WITH MINERALS) TABS tablet Take 1 tablet by mouth daily.    . potassium chloride (K-DUR) 10 MEQ tablet TAKE 1 TABLET BY MOUTH EVERY DAY 90 tablet 3  . torsemide (DEMADEX) 20 MG tablet Take 2 tablets (40 mg total) by mouth daily. 60 tablet 6   No current facility-administered medications for this visit.     PHYSICAL EXAMINATION: ECOG PERFORMANCE STATUS: 2 - Symptomatic, <50% confined to bed  Exam not performed today   LABORATORY DATA:  No new lab    RADIOGRAPHIC STUDIES: I have personally reviewed the radiological images as listed and agreed with the findings in the report. No results found.   ASSESSMENT & PLAN:  Nathaniel [name] is a 85 y.o. male with   1. 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 He is s/p concurrent chemotherapy and radiation. He opted not to have surgery due to his advanced age  -Due to his advanced age, I previously discussed that I may not offer chemotherapy if he has disease recurrence.  -His genetic testing was negative -We discussed his PET scan from 11/15/18 which shows several retroperitoneal adenopathy (largest 3cm) with low grade FDG uptake, consistent with metastasis. There are new small irregular nodules in left and right lung, which can possibly be related to his cancer. No liver metastasis was seen.  -Given current COVID-19 and his advanced age, I do not recommend biopsy, his scan findings and recently elevated CEA are consistent with metastatic disease.  -I discussed with metastatic disease his cancer is not curable but still treatable to control his disease. -I sent his initial biopsy for MSI and [facility] testing to  see if he is eligible for target or immunotherapy. Results are pending.  -If immunotherapy and target therapy is not an option, I would recommend oral chemotherapy Xeloda which he took before. He is interested.  -I also recommend palliative radiation to shrink his LN and help his edema and back pain if needed. This can wait 1-2 months given COVID-19.  He has seen Dr. Lisbeth Renshaw, and would like to hold on RT now due to his severe spinal stenosis and difficulty laying flat  -Will proceed with treatment options based on FO results.  -F/u in 2 weeks with E-visit    2. Worsening Lower extremity edema  -On Torsemide 40 mg daily  -F/u Cardiologist  -Given his edema is likely related to his lymphadenopathy, he has the option of palliative radiation if it gets worse.    3. Low back pain -secondary to spinal stenosis and metastasis - Resolved, after completion of chemoradiation -Given this is likely related to his lymphadenopathy, he has the option of radiation to shrink his LN and improve  his edema and back pain.   4. HTN, AF, arthritis -F/u with PCP  5.History of skin cancer including melanoma  -F/u with Dermatologist    Plan -F/u in 2 weeks to discuss FO results and finalize his systemic therapy  -He would like to hold on radiation for now     No problem-specific Assessment & Plan notes found for this encounter.   No orders of the defined types were placed in this encounter.  All questions were answered. The patient knows to call the clinic with any problems, questions or concerns. No barriers to learning was detected. I spent 15 minutes with pt on the phone. The total time spent in the appointment was 20 minutes.      [name], MD 12/08/2018   I, Joslyn Devon, am acting as scribe for [name], MD.   I have reviewed the above documentation for accuracy and completeness, and I agree with the above.

## 2018-12-14 ENCOUNTER — Other Ambulatory Visit: Payer: Self-pay | Admitting: Nurse Practitioner

## 2018-12-16 ENCOUNTER — Ambulatory Visit (HOSPITAL_BASED_OUTPATIENT_CLINIC_OR_DEPARTMENT_OTHER): Payer: Medicare Other | Admitting: Hematology

## 2018-12-16 DIAGNOSIS — C20 Malignant neoplasm of rectum: Secondary | ICD-10-CM

## 2018-12-16 DIAGNOSIS — Z7189 Other specified counseling: Secondary | ICD-10-CM | POA: Diagnosis not present

## 2018-12-17 ENCOUNTER — Encounter: Payer: Self-pay | Admitting: Hematology

## 2018-12-17 DIAGNOSIS — Z7189 Other specified counseling: Secondary | ICD-10-CM | POA: Insufficient documentation

## 2018-12-17 MED ORDER — CAPECITABINE 500 MG PO TABS: ORAL_TABLET | ORAL | 1 refills | Status: DC

## 2018-12-17 MED ORDER — ONDANSETRON HCL 4 MG PO TABS: 4.0000 mg | ORAL_TABLET | Freq: Three times a day (TID) | ORAL | 0 refills | Status: DC | PRN

## 2018-12-17 NOTE — Progress Notes (Signed)
[place]   Telephone:([phone]) [phone] Fax:([phone]   Clinic Follow up Note   Patient Care Team: [name], MD as PCP - General (Family Medicine) Ronald Lobo, MD as Consulting Physician (Gastroenterology) Tania Ade, RN as Registered Nurse Kyung Rudd, MD as Consulting Physician (Radiation Oncology) [name], MD as Consulting Physician (Hematology) 12/17/2018  I connected with Nathaniel [name] on 12/16/2018 at 1pm EDT by telephone visit and verified that I am speaking with the correct person using two identifiers.   I discussed the limitations, risks, security and privacy concerns of performing an evaluation and management service by telephone and the availability of in person appointments. I also discussed with the patient that there may be a patient responsible charge related to this service. The patient expressed understanding and agreed to proceed.   Patient's location:  home  Provider's location:  My Office    CHIEF COMPLAINT: f/u metastatic rectal cancer   SUMMARY OF ONCOLOGIC HISTORY: Oncology History    Rectal cancer ([place])   Staging form: Colon and Rectum, AJCC 7th Edition     Clinical stage from 08/08/2015: Stage IIIB (T3, N1, M0) - Unsigned       Rectal cancer ([place])   07/16/2015 Pathology Results    Biopsy positive for invasive adenocarcinoma with signet ring cell features    07/23/2015 Initial Diagnosis    Rectal cancer ([address])    08/12/2015 Concurrent Chemotherapy    Xeloda 1564m q12h on the day of radiation     08/12/2015 - 09/20/2015 Radiation Therapy    radiaiton to rectal cancer     02/18/2016 Procedure    FLEX SIGMOIDOSCOPY: Endoscopic disappearance of previous rectal mass; atrophic mucosa in distal rectum  per Dr. BCristina Gong   02/18/2016 Pathology Results    02/18/2016 Surgical Pathology Diagnosis Surgical [P], rectum - MILD GLANDULAR ARCHITECTURAL CHANGES AND REPARATIVE FEATURES. - NO ADENOMATOUS CHANGE OR MALIGNANCY  IDENTIFIED.    02/20/2016 Pathology Results    No adenomatous change or malignancy    05/11/2017 Imaging    CT CAP IMPRESSION: 1. Newly enlarged 1.3 cm in short axis aortocaval node, while not entirely specific, is concerning for recurrent malignancy. No other indicators of recurrence identified. Biopsy or nuclear medicine PET-CT could be utilized to provide greater specificity if clinically warranted. 2. Continued abnormal wall thickening in the inferior rectum. Surrounding perirectal and presacral stranding likely from prior radiation therapy. 3. Prostatomegaly, prostate volume 93 cubic cm. 4. Other imaging findings of potential clinical significance: Aortic Atherosclerosis (ICD10-I70.0). Coronary atherosclerosis with mild cardiomegaly. Markedly severe chronic arthropathy in the left hip. Suspected cyst in segment 6 of the liver, unchanged. Prominent stool throughout the colon favors constipation. Direct right inguinal hernia contains adipose tissue. Possible retraction of the left testicle. 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    07/28/2017 Imaging    CT AP W Contrast 07/28/17  IMPRESSION: 1. Residual rectal wall thickening, as before. Mildly enlarged aortocaval lymph node is stable. No additional evidence of metastatic disease. 2. Aortic atherosclerosis (ICD10-170.0). Three-vessel coronary artery calcification. 3. Enlarged prostate. 4. Advanced left hip osteoarthritis.    01/12/2018 Procedure     01/12/2018 Sigmoidoscopy Erythematous and febrile mucosa in the mid rectum consistent with prior chemoradiation. No evidence or local recurrence.     11/03/2018 Imaging    CT AP W Contrast  IMPRESSION: 1. Findings consistent with metastatic pelvic and retroperitoneal lymphadenopathy, which has developed/increased since the prior CT. This is presumably metastatic disease from  rectal carcinoma. Retroperitoneal adenopathy partly compresses the inferior vena cava and  may account for lower extremity edema. 2. There are also irregular nodules of the lung bases concerning for metastatic disease, new since the prior CT. 3. No other evidence of metastatic disease. 4. No acute findings. 5. Prostatic hypertrophy. 6. Aortic atherosclerosis.    11/15/2018 Imaging    PET  IMPRESSION: 1. Relatively mild metabolic activity associated with the iliac adenopathy and retroperitoneal periaortic adenopathy is consistent with mucinous (signet cell) rectal adenocarcinoma which has relatively low metabolic activity. 2. Irregular smudgy nodules within LEFT and RIGHT lung with mild metabolic activity are concerning for rectal carcinoma metastasis to the lungs. 3. No liver metastasis.      CURRENT THERAPY:  Pending   INTERVAL HISTORY: I called pt today to discuss his recent FO results and next treatment plan. He is clinically stable, he is lower extremity edema is stable, he denies any pain, or other new symptoms.  He is able to live independent.    REVIEW OF SYSTEMS:   Constitutional: Denies fevers, chills or abnormal weight loss Eyes: Denies blurriness of vision Ears, nose, mouth, throat, and face: Denies mucositis or sore throat Respiratory: Denies cough, dyspnea or wheezes Cardiovascular: Denies palpitation, chest discomfort, (+) bilateral low extremities edema  Gastrointestinal:  Denies nausea, heartburn or change in bowel habits Skin: Denies abnormal skin rashes Lymphatics: Denies new lymphadenopathy or easy bruising Neurological:Denies numbness, tingling or new weaknesses Behavioral/Psych: Mood is stable, no new changes  All other systems were reviewed with the patient and are negative.  MEDICAL HISTORY:  Past Medical History:  Diagnosis Date  . Anemia due to antineoplastic chemotherapy   . Atherosclerosis of coronary artery cardiologist-  dr Aundra Dubin   aorta and coronary arteries moderate per ct 02-07-2016  . Basal cell carcinoma    40+  . Bladder  calculi   . BPH (benign prostatic hyperplasia)   . Calculi, urethra   . Chronic low back pain   . Constipation   . ED (erectile dysfunction)   . Edema of lower extremity   . Family history of melanoma   . Foley catheter in place   . History of adenomatous polyp of colon    2007  . History of basal cell carcinoma excision    multiple  . History of cellulitis    left lower leg-- resolved  . History of melanoma excision    temple right side/ multiple  . History of obstructive sleep apnea    per pt cured by hypnosis  . History of pericarditis    acute episode 09/ 2014  per cardiologist note , dr Aundra Dubin , related possibly to xarelto use  . History of ulcerative colitis    remote  . HTN (hypertension)   . Hypercholesteremia   . Hypothyroidism   . Incomplete right bundle branch block   . LAFB (left anterior fascicular block)   . Melanoma ([place])   . OA (osteoarthritis)   . PAF (paroxysmal atrial fibrillation) [facility]) cardiologist-  dr dalton Aundra Dubin   dx 09/ 2014  in setting of acute pericarditis   . Rectal cancer [facility]) dx 07-16-2015  oncologist-  dr [name]   Stage IIIB (cT3, N1, M0)-- concurrent  chemotherapy started 08-12-2015/  radiation complete (08-12-2015 to 09-20-2015)  . S/P radiation therapy 08/12/15-09/20/15   rectal ca50.4Gy total dose  . Spinal stenosis     SURGICAL HISTORY: Past Surgical History:  Procedure Laterality Date  . CATARACT EXTRACTION W/ INTRAOCULAR LENS  IMPLANT Bilateral 10/ 2016  . CYSTOSCOPY WITH LITHOLAPAXY N/A 02/12/2016   Procedure: CYSTOSCOPY WITH LITHOLAPAXY;  Surgeon: Alexis Frock, MD;  Location[facility];  Service: Urology;  Laterality: N/A;  . EUS N/A 08/08/2015   Procedure: LOWER ENDOSCOPIC ULTRASOUND (EUS);  Surgeon: Arta Silence, MD;  Location: [facility] ENDOSCOPY;  Service: Endoscopy;  Laterality: N/A;  . FLEXIBLE SIGMOIDOSCOPY N/A 08/08/2015   Procedure: FLEXIBLE SIGMOIDOSCOPY;  Surgeon: Arta Silence, MD;  Location: [facility]  ENDOSCOPY;  Service: Endoscopy;  Laterality: N/A;  poor prep  . HOLMIUM LASER APPLICATION N/A 0/09/270   Procedure: HOLMIUM LASER APPLICATION;  Surgeon: Alexis Frock, MD;  Location[facility];  Service: Urology;  Laterality: N/A;  . INGUINAL HERNIA REPAIR Left 1965  . LEFT KNEE SURGERY  1940  . MOHS SURGERY  x9  last one 2014   . ORIF LEFT ANKLE FX  1935  . ROTATOR CUFF REPAIR Bilateral right 2013/  left 2010  . TRANSTHORACIC ECHOCARDIOGRAM  06-06-2013   ef 55-60%/  mild AR/  mild dilated aortic root/  mild LAE and RAE/  mild to moderate TR/  small pericardial effusion identified    I have reviewed the social history and family history with the patient and they are unchanged from previous note.  ALLERGIES:  is allergic to cinnamon; ibuprofen; percocet [oxycodone-acetaminophen]; protonix [pantoprazole sodium]; and xarelto [rivaroxaban].  MEDICATIONS:  Current Outpatient Medications  Medication Sig Dispense Refill  . aspirin EC 325 MG tablet Take 325 mg by mouth daily.     . finasteride (PROSCAR) 5 MG tablet Take 5 mg by mouth every evening. Dose is taken at bedtime    . levothyroxine (SYNTHROID) 112 MCG tablet Take 1 tablet (112 mcg total) by mouth daily before breakfast. 90 tablet 3  . metoprolol succinate (TOPROL-XL) 25 MG 24 hr tablet TAKE 1/2 TABLET BY MOUTH DAILY. 45 tablet 2  . Multiple Vitamin (MULTIVITAMIN WITH MINERALS) TABS tablet Take 1 tablet by mouth daily.    . potassium chloride (K-DUR) 10 MEQ tablet TAKE 1 TABLET BY MOUTH EVERY DAY 90 tablet 3  . torsemide (DEMADEX) 20 MG tablet Take 2 tablets (40 mg total) by mouth daily. 60 tablet 6   No current facility-administered medications for this visit.     PHYSICAL EXAMINATION: ECOG PERFORMANCE STATUS: 2 - Symptomatic, <50% confined to bed  There were no vitals filed for this visit. There were no vitals filed for this visit.  GENERAL:alert, no distress and comfortable SKIN: skin color, texture, turgor  are normal, no rashes or significant lesions EYES: normal, Conjunctiva are pink and non-injected, sclera clear OROPHARYNX:no exudate, no erythema and lips, buccal mucosa, and tongue normal  NECK: supple, thyroid normal size, non-tender, without nodularity LYMPH:  no palpable lymphadenopathy in the cervical, axillary or inguinal LUNGS: clear to auscultation and percussion with normal breathing effort HEART: regular rate & rhythm and no murmurs and no lower extremity edema ABDOMEN:abdomen soft, non-tender and normal bowel sounds Musculoskeletal:no cyanosis of digits and no clubbing  NEURO: alert & oriented x 3 with fluent speech, no focal motor/sensory deficits  LABORATORY DATA:  I have reviewed the data as listed CBC Latest Ref Rng & Units 11/10/2018 09/28/2018 06/02/2018  WBC 4.0 - 10.5 K/uL 5.8 6.5 4.3  Hemoglobin 13.0 - 17.0 g/dL 11.9(L) 13.5 13.4  Hematocrit 39.0 - 52.0 % 36.8(L) 39.6 39.5  Platelets 150 - 400 K/uL 236 252 201     CMP Latest Ref Rng & Units 11/10/2018 11/01/2018 10/05/2018  Glucose 70 - 99 mg/dL 114(H) 94 92  BUN 8 - 23 mg/dL 24(H) 26 17  Creatinine 0.61 - 1.24 mg/dL 1.19 1.02 1.06  Sodium 135 - 145 mmol/L 136 139 138  Potassium 3.5 - 5.1 mmol/L 4.5 4.9 4.8  Chloride 98 - 111 mmol/L 99 98 98  CO2 22 - 32 mmol/L _0 Calcium 8.9 - 10.3 mg/dL 9.2 9.4 9.4  Total Protein 6.5 - 8.1 g/dL 6.8 - -  Total Bilirubin 0.3 - 1.2 mg/dL 0.6 - -  Alkaline Phos 38 - 126 U/L 109 - -  AST 15 - 41 U/L 23 - -  ALT 0 - 44 U/L 16 - -      RADIOGRAPHIC STUDIES: I have personally reviewed the radiological images as listed and agreed with the findings in the report. No results found.   ASSESSMENT & PLAN:  Nathaniel [name] is a 85 y.o. male with history of  1. Low rectal adenocarcinoma, cT3N1M0, stage IIIB, with nodes metastatic recurrence in 11/2018   - He was diagnosed on 07/23/2015. He is s/p concurrent chemotherapy and radiation. He opted not to have surgery due to his  advanced age  -His genetic testing was negative -He recently went to his cardiologist due to complains of worsening lower extremity edema and had a CT scans done on 11/03/18 which showed significant abdominal and pelvic adenopathy, highly suspicious for metastatic disease.   -I discussed his PET scan from November 15, 2018, which showed mild hypermetabolic lymphadenopathy in pelvic and retroperitoneum, consistent with metastatic disease from his mucinous rectal adenocarcinoma.  I personally reviewed his scan images and discussed with patient.  Due to his advanced age, I do not think biopsy is needed. -He has discussed radiation with Dr. Lisbeth Renshaw,  due to his spinal stenosis, it is very painful for him to lay flat on the table for radiation, and he prefers not to have RT.  -I discussed his foundation 1 genomic test results, which showed K-ras, NRAS, and BRAF wild-type, MSS, he is not a candidate for immunotherapy, but would never do from EGFR inhibitor, such as vectibix.  He previously tolerated Xeloda chemotherapy well with concurrent radiation, I think it's reasonable to let him try oral Xeloda and iv Vectibix  -Due to his advanced age, palliative alone is very reasonable, I discussed with pt. after lengthy discussion, patient would like to try Xeloda and vectibix --Chemotherapy consent: Side effects including but does not not limited to, fatigue, nausea, vomiting, diarrhea, hair loss, neuropathy, fluid retention, renal and kidney dysfunction, neutropenic fever, needed for blood transfusion, bleeding, skin rash, were discussed with patient in great detail. He agrees to proceed. -The goal of therapy is palliative, to control his disease, and prolong his life -If he has significant side effects from treatment, I have a low threshold to stop his treatment. -will start him on Xeloda in a few weeks, 819m/m2 (1505mam, 100015mm), 7 days on and 7 days off  -due to current COVID-19 pandemic, will start him on  Vectibix in 4-6 weeks  -f/u in 5-6 weeks    2. Worsening Lower extremity edema  - On Torsemide 40 mg daily  - F/u Cardiologist  -Possible related to RP adenopathy   -pt declined palliative RT    3. HTN, AF, arthritis -F/u with PCP  4.History of skin cancer including melanoma  -F/u with Dermatologist    5. Low back pain -secondary to spinal stenosis and rectal pain - Resolved, after completion  of chemoradiation   6. Goal of care discussion  -We discussed the incurable nature of his cancer, and the overall poor prognosis, especially if he does not have good response to chemotherapy or progress on chemo -The patient understands the goal of care is palliative. -Pt agreed DNR/DNI  Plan -I called in Xeloda 1534m am, 10038mpm, one week on , one week off, [place] -lab, f/u and Vectibix in 5-6 weeks  -I spoke with his HCPOA daughter CaArbie Cookeydiscussed the above and answered all her questions   No orders of the defined types were placed in this encounter.  I discussed the assessment and treatment plan with the patient. The patient was provided an opportunity to ask questions and all were answered. The patient agreed with the plan and demonstrated an understanding of the instructions.  The patient was advised to call back or seek an in-person evaluation if the symptoms worsen or if the condition fails to improve as anticipated.   I provided 25 minutes of non face-to-face telephone visit time during this encounter, and > 50% was spent counseling as documented under my assessment & plan.        YaTruitt MerleMD 12/17/18

## 2018-12-17 NOTE — Progress Notes (Signed)
START ON PATHWAY REGIMEN - Colorectal     A cycle is every 21 days:     Capecitabine   **Always confirm dose/schedule in your pharmacy ordering system**  Patient Characteristics: Distant Metastases, First Line, Nonsurgical Candidate, KRAS/NRAS Wild-Type, BRAF Wild-Type/Unknown, PS > 1; Bevacizumab Ineligible Therapeutic Status: Distant Metastases BRAF Mutation Status: Wild-Type (no mutation) KRAS/NRAS Mutation Status: Wild-Type (no mutation) Line of Therapy: First Engineer, maintenance (IT) Status: 2 Bevacizumab Eligibility: Ineligible Intent of Therapy: Non-Curative / Palliative Intent, Discussed with Patient

## 2018-12-19 ENCOUNTER — Telehealth: Payer: Self-pay | Admitting: Pharmacist

## 2018-12-19 DIAGNOSIS — C20 Malignant neoplasm of rectum: Secondary | ICD-10-CM

## 2018-12-19 MED ORDER — CAPECITABINE 500 MG PO TABS: ORAL_TABLET | ORAL | 1 refills | Status: DC

## 2018-12-19 NOTE — Telephone Encounter (Signed)
Oral Chemotherapy Pharmacist Encounter   I spoke with patient for overview of: Xeloda (capecitabine) for the treatment of metastatic rectal cancer in conjunction with infusional Vectibix, planned duration until disease progression or unacceptable toxicity.   Counseled patient on administration, dosing, side effects, monitoring, drug-food interactions, safe handling, storage, and disposal.  Patient will take Xeloda 500mg  tablets, 3 tablets (1500mg ) by mouth in AM and 2 tabs (1000mg ) by mouth in PM, within 30 minutes of finishing meals, on days 1-7 & 15-21 of each 28 day cycle.   Vectibix will be infused at 6 mg/kg grams IV given once every 2 weeks  Xeloda start date: 12/26/2018 Vectibix will be added at some point in May 2020, delayed due to COVID-19 pandemic  Adverse effects of Xeloda include but are not limited to: fatigue, decreased blood counts, GI upset, diarrhea, mouth sores, and hand-foot syndrome.  Patient has anti-emetic on hand and knows to take it if nausea develops.   Patient will obtain anti diarrheal and alert the office of 4 or more loose stools above baseline. He denies diarrhea with previous Xeloda administration.  Adverse effects of Vectibix include but are not limited to: Fatigue, nausea, vomiting, diarrhea, acneform skin rash, and magnesium wasting.  Reviewed with patient importance of keeping a medication schedule and plan for any missed doses.  Mr. Endsley voiced understanding and appreciation. Patient stated I did not need to speak with daughter Arbie Cookey at this time however, he would give her my contact information if she has any additional questions or concerns.  All questions answered. Medication reconciliation performed and medication/allergy list updated.  Insurance authorization for Xeloda is not required at this time. Patient with Medicare supplement, copayment for first fill of Xeloda is $0. The first fill of Xeloda will ship from the [facility] on 12/21/2018 for delivery to patient's home on 4/16. Patient will receive enough Xeloda for a 4 calendar week supply (for 2 "on" weeks of Xeloda)  Patient informed the pharmacy will reach out to him 5-7 days prior to needing next fill of Xeloda to coordinate continued medication acquisition, to prevent any break in therapy.  Patient informed he will be hearing from the office over the next month or so to schedule first infusion of Vectibix.  Patient knows to call the office with questions or concerns. Oral Oncology Clinic will continue to follow.  Johny Drilling, PharmD, BCPS, BCOP  12/19/2018   11:03 AM Oral Oncology Clinic [phone]

## 2018-12-19 NOTE — Telephone Encounter (Addendum)
Oral Oncology Pharmacist Encounter  Received new prescription for Xeloda (capecitabine) for the treatment of metastatic rectal cancer in conjunction with infusional Vectibix, planned duration until disease progression or unacceptable toxicity. Planned Xeloda start date in 1-2 weeks, pending medication acquisition Vectibix will be added at some point in May 2020, delayed due to COVID-19 pandemic  Original diagnosis November 2016 with stage IIIb rectal cancer Patient received Xeloda with radiation 08/12/2015-09/20/2015 Patient tolerated concurrent chemoradiation fairly well He has been under observation since that time, recurrence noted in late March 2020 Patient is now under evaluation to start treatment for cancer recurrence with combination Xeloda and Vectibix  Xeloda is planned to be administered at ~570 mg/m2 by mouth 2 times daily for 7 days on, 7 days off, and repeated Vectibix will be administered at 6 mg/kg grams IV given once every 2 weeks  Labs from 11/10/2018 assessed, okay for treatment initiation. SCr=1.19, est CrCl ~ 45 mL/min Manufacturer recommends dose reduction to 75% of original starting dose with CrCl 30-50 mL/min, noted dose reduction for Xeloda  Current medication list in Epic reviewed, no DDI with Xeloda identified.  Prescription has been e-scribed to the [facility] for benefits analysis and approval.  Oral Oncology Clinic will continue to follow for insurance authorization, copayment issues, initial counseling and start date.  Initial counseling and medication acquisition details will be discussed with patient and daughter, Arbie Cookey, who is HCPOA.  Johny Drilling, PharmD, BCPS, BCOP  12/19/2018 6:48 AM Oral Oncology Clinic [phone]

## 2018-12-19 NOTE — Telephone Encounter (Signed)
Oral Oncology Patient Advocate Encounter  Nathaniel [name] has Part B and a supplement, this made his out of pocket cost for Xeloda $0.  No Prior Authorization is required for Part [name] Phone [phone] Fax [phone] 12/19/2018   9:23 AM

## 2018-12-20 ENCOUNTER — Telehealth: Payer: Self-pay | Admitting: Hematology

## 2018-12-20 NOTE — Telephone Encounter (Signed)
Scheduled appt per 4/11 and 4/13 - pt aware of appt date and time

## 2018-12-21 ENCOUNTER — Telehealth: Payer: Self-pay | Admitting: Nurse Practitioner

## 2018-12-21 MED FILL — ONDANSETRON HCL 4 MG TABLET: 4 | 5 days supply | Qty: 30 | Fill #0

## 2018-12-21 MED FILL — CAPECITABINE 500 MG TABS: 500 | 28 days supply | Qty: 70 | Fill #0

## 2018-12-21 NOTE — Telephone Encounter (Signed)
Pt called and said he had an issue he had to discuss with [facility] directly. He did not give details about what it was, only that he would like Lori's advice before proceeding further. He will be home most of the day.

## 2018-12-21 NOTE — Telephone Encounter (Signed)
I have called Nathaniel [name] - he is starting oral chemo next week and IV chemo next month. He was asking about his current medicines. Ok to cut Torsemide in half if he begins having diarrhea to prevent dehydration. He is having trouble getting his compression stockings on - he will wear them when he can.   I have a visit with him in May - he is aware that this will be a virtual visit.   He is to call me if he needs anything.   Burtis Junes, RN, [place] [address] [place], [place]  [address] [phone]

## 2018-12-21 NOTE — Telephone Encounter (Signed)
Oral Oncology Patient Advocate Encounter  Confirmed with [place] that Xeloda was shipped on 12/21/18 with a $0 copay.   [place] Patient [place] Phone [phone] Fax [phone] 12/21/2018   3:01 PM

## 2019-01-16 ENCOUNTER — Telehealth: Payer: Self-pay | Admitting: *Deleted

## 2019-01-16 NOTE — Progress Notes (Signed)
Telehealth Visit     Virtual Visit via Video Note   This visit type was conducted due to national recommendations for restrictions regarding the COVID-19 Pandemic (e.g. social distancing) in an effort to limit this patient's exposure and mitigate transmission in our community.  Due to his co-morbid illnesses, this patient is at least at moderate risk for complications without adequate follow up.  This format is felt to be most appropriate for this patient at this time.  All issues noted in this document were discussed and addressed.  A limited physical exam was performed with this format.  Please refer to the patient's chart for his consent to telehealth for [facility].   Evaluation Performed:  Follow-up visit  This visit type was conducted due to national recommendations for restrictions regarding the COVID-19 Pandemic (e.g. social distancing).  This format is felt to be most appropriate for this patient at this time.  All issues noted in this document were discussed and addressed.  No physical exam was performed (except for noted visual exam findings with Video Visits).  Please refer to the patient's chart (MyChart message for video visits and phone note for telephone visits) for the patient's consent to telehealth for [facility].  Date:  01/17/2019   ID:  Nathaniel [name], DOB 15-May-1920, MRN [id number]  Patient Location:  [facility]  Provider location:   Home  PCP:  [name], MD  Cardiologist:  Servando Snare Electrophysiologist:  None   Chief Complaint:  Follow up  History of Present Illness:    Nathaniel [name] is a 85 y.o. male who presents via audio/video conferencing for a telehealth visit today.  Seen for Dr. Aundra Dubin.Primarily follows with me.  He has been an Education administrator forover40 years.  He has hadacute pericarditis and atrial fibrillation.He was admitted in early 9/14 with pleuritic chest pain. He had diffuse slight ST elevation on ECG and was  thought to have acute pericarditis, ?viral pericarditis. No pericardial effusion initially. While in the hospital, he went into atrial fibrillation with RVR and was treated with amiodarone - not long term. He converted to NSR andhas remained in NSR.He was started on Xarelto in the hospital. Followup echo later in 9/14 showed the development of a moderate pericardial effusion without tamponade. Xarelto was stopped. Patient had another echo on 06/06/13 that showed a small pericardial effusion (improved).   Hewas thendiagnosed with rectal cancer and underwent chemotherapy and radiation, opted against surgery.   Last seen byDr. Aundra Dubin back in [place] was felt to be doing ok from our standpoint.Ihave followed him since. He has had followup CTs and sigmoidoscopywith good reports.  He has elected to remain on high dose aspirin - patient preference.  I saw him several times earlier this year - having more swelling - noted to be in AF - discussed with Dr. Lovena Le and we both agreed to NOT anticoagulate given his age. We changed him from Lasix to Torsemide. Ended up getting scan on his belly - his cancer has returned. Referred back to oncology.   The patient does not have symptoms concerning for COVID-19 infection (fever, chills, cough, or new shortness of breath).   Seen today via Doximity video. He has consented for this visit. He is at [employer] He did get to go on his trip to [place] and got to see his son who is in an assisted living facility the day prior to that being shut down. He is now on oral chemo with Xeloda. Looks like he will  be starting Vectibix going forward. He is tired - this may be progressive since he has been on the chemo.  Still with the swelling - but not oozing anymore and overall improved. Not wearing compression stockings. BP is ok. Weight is going down. No chest pain. No palpitations. He feels like he is ok at this time - very realistic.   Past  Medical History:  Diagnosis Date   Anemia due to antineoplastic chemotherapy    Atherosclerosis of coronary artery cardiologist-  dr Aundra Dubin   aorta and coronary arteries moderate per ct 02-07-2016   Basal cell carcinoma    40+   Bladder calculi    BPH (benign prostatic hyperplasia)    Calculi, urethra    Chronic low back pain    Constipation    ED (erectile dysfunction)    Edema of lower extremity    Family history of melanoma    Foley catheter in place    History of adenomatous polyp of colon    2007   History of basal cell carcinoma excision    multiple   History of cellulitis    left lower leg-- resolved   History of melanoma excision    temple right side/ multiple   History of obstructive sleep apnea    per pt cured by hypnosis   History of pericarditis    acute episode 09/ 2014  per cardiologist note , dr Aundra Dubin , related possibly to xarelto use   History of ulcerative colitis    remote   HTN (hypertension)    Hypercholesteremia    Hypothyroidism    Incomplete right bundle branch block    LAFB (left anterior fascicular block)    Melanoma ([place])    OA (osteoarthritis)    PAF (paroxysmal atrial fibrillation) [facility]) cardiologist-  dr dalton Aundra Dubin   dx 09/ 2014  in setting of acute pericarditis    Rectal cancer [facility]) dx 07-16-2015  oncologist-  dr Krista Blue feng   Stage IIIB (cT3, N1, M0)-- concurrent  chemotherapy started 08-12-2015/  radiation complete (08-12-2015 to 09-20-2015)   S/P radiation therapy 08/12/15-09/20/15   rectal ca50.4Gy total dose   Spinal stenosis    Past Surgical History:  Procedure Laterality Date   CATARACT EXTRACTION W/ INTRAOCULAR LENS IMPLANT Bilateral 10/ 2016   CYSTOSCOPY WITH LITHOLAPAXY N/A 02/12/2016   Procedure: CYSTOSCOPY WITH LITHOLAPAXY;  Surgeon: Alexis Frock, MD;  Location: [facility];  Service: Urology;  Laterality: N/A;   EUS N/A 08/08/2015   Procedure: LOWER ENDOSCOPIC ULTRASOUND (EUS);   Surgeon: Arta Silence, MD;  Location: [facility] ENDOSCOPY;  Service: Endoscopy;  Laterality: N/A;   FLEXIBLE SIGMOIDOSCOPY N/A 08/08/2015   Procedure: FLEXIBLE SIGMOIDOSCOPY;  Surgeon: Arta Silence, MD;  Location: [facility] ENDOSCOPY;  Service: Endoscopy;  Laterality: N/A;  poor prep   HOLMIUM LASER APPLICATION N/A 11/07/9922   Procedure: HOLMIUM LASER APPLICATION;  Surgeon: Alexis Frock, MD;  Location: [facility];  Service: Urology;  Laterality: N/A;   INGUINAL HERNIA REPAIR Left 1965   LEFT KNEE SURGERY  1940   MOHS SURGERY  x9  last one 2014    ORIF LEFT ANKLE FX  1935   ROTATOR CUFF REPAIR Bilateral right 2013/  left 2010   TRANSTHORACIC ECHOCARDIOGRAM  06-06-2013   ef 55-60%/  mild AR/  mild dilated aortic root/  mild LAE and RAE/  mild to moderate TR/  small pericardial effusion identified     Current Meds  Medication Sig   aspirin EC 325  MG tablet Take 325 mg by mouth daily.    capecitabine (XELODA) 500 MG tablet Take 3 tablets (1500mg ) by mouth in AM & 2 tabs (1000mg ) in PM, immediately after food, take on days 1-7 & 15-21 of each 28d cycle   finasteride (PROSCAR) 5 MG tablet Take 5 mg by mouth every evening. Dose is taken at bedtime   levothyroxine (SYNTHROID) 112 MCG tablet Take 1 tablet (112 mcg total) by mouth daily before breakfast.   metoprolol succinate (TOPROL-XL) 25 MG 24 hr tablet TAKE 1/2 TABLET BY MOUTH DAILY.   Multiple Vitamin (MULTIVITAMIN WITH MINERALS) TABS tablet Take 1 tablet by mouth daily.   ondansetron (ZOFRAN) 4 MG tablet Take 1-2 tablets (4-8 mg total) by mouth every 8 (eight) hours as needed for nausea or vomiting.   potassium chloride (K-DUR) 10 MEQ tablet TAKE 1 TABLET BY MOUTH EVERY DAY   torsemide (DEMADEX) 20 MG tablet Take 20 mg by mouth daily.   [DISCONTINUED] torsemide (DEMADEX) 20 MG tablet Take 2 tablets (40 mg total) by mouth daily. (Patient taking differently: Take 20 mg by mouth daily. )     Allergies:   Cinnamon;  Ibuprofen; Percocet [oxycodone-acetaminophen]; Protonix [pantoprazole sodium]; and Xarelto [rivaroxaban]   Social History   Tobacco Use   Smoking status: Never Smoker   Smokeless tobacco: Never Used  Substance Use Topics   Alcohol use: Yes    Alcohol/week: 1.0 standard drinks    Types: 1 Glasses of wine per week    Comment: 1 glass of wine per week   Drug use: No     Family Hx: The patient's family history includes Cancer in his daughter; Cancer (age of onset: 50) in his daughter; Cancer - Cervical in his daughter; Heart disease in his brother, father, and maternal uncle; Melanoma (age of onset: 3) in his brother; Parkinson's disease in his mother; Stroke in his father.  ROS:   Please see the history of present illness.   All other systems reviewed are negative except for swelling.    Objective:    Vital Signs:  BP 120/62    Pulse 64    Temp (!) 97.4 F (36.3 C)    Resp 20    Wt 200 lb 8 oz (90.9 kg)    SpO2 98%    BMI 26.45 kg/m    Wt Readings from Last 3 Encounters:  01/17/19 200 lb 8 oz (90.9 kg)  11/08/18 206 lb 4.8 oz (93.6 kg)  11/01/18 210 lb (95.3 kg)    Alert male in no acute distress. Not short of breath with conversation. His weight is down from my last visit by 10 pounds.    Labs/Other Tests and Data Reviewed:    Lab Results  Component Value Date   WBC 5.8 11/10/2018   HGB 11.9 (L) 11/10/2018   HCT 36.8 (L) 11/10/2018   PLT 236 11/10/2018   GLUCOSE 114 (H) 11/10/2018   ALT 16 11/10/2018   AST 23 11/10/2018   NA 136 11/10/2018   K 4.5 11/10/2018   CL 99 11/10/2018   CREATININE 1.19 11/10/2018   BUN 24 (H) 11/10/2018   CO2 28 11/10/2018   TSH 5.640 (H) 09/28/2018     BNP (last 3 results) No results for input(s): BNP in the last 8760 hours.  ProBNP (last 3 results) No results for input(s): PROBNP in the last 8760 hours.    Prior CV studies:    The following studies were reviewed today:  EchoStudy Conclusionsfrom 08/2016  -  Left  ventricle: The cavity size was normal. There was moderate concentric hypertrophy. Systolic function was normal. The estimated ejection fraction was in the range of 60% to 65%. Wall motion was normal; there were no regional wall motion abnormalities. Doppler parameters are consistent with abnormal left ventricular relaxation (grade 1 diastolic dysfunction). Doppler parameters are consistent with indeterminate ventricular filling pressure. - Aortic valve: Transvalvular velocity was within the normal range. There was no stenosis. There was mild regurgitation. - Aorta: Ascending aortic diameter: 35 mm (S). - Mitral valve: Transvalvular velocity was within the normal range. There was no evidence for stenosis. There was no regurgitation. - Right ventricle: The cavity size was mildly dilated. Wall thickness was normal. Systolic function was normal. - Right atrium: The atrium was mildly dilated. - Atrial septum: No defect or patent foramen ovale was identified by color flow Doppler. - Tricuspid valve: There was mild regurgitation. - Pulmonary arteries: PA peak pressure: 38 mm Hg (S).     ASSESSMENT & PLAN:    1.Worsening lower extremity edema - known venous insufficiency -he has been found to be in AF.  Also now with metastatic disease from prior rectal adenocarcinoma - probably the most impactful on his swelling - remains on diuretic - labs from March noted.    2. Prior history of pericarditis - dates back to 2014 -no recurrence.  3. Prior PAF -now persistent - his originalepisode in the setting of acute pericarditis - no longer on Xarelto due to possibility that it was associated with pericardial effusion.CHADSVASC is at least a 3.He has been on high dose aspirin since this timedue to the reaction from Xarelto.  CHADSVASC is 3 and HASBLED is 3 - discussed with Dr. Tama Headings prior visit- given his age and prior issue with Xarelto - will stay on high dose  aspirin and not placed back on anticoagulation.  4. Prior rectal bleeding/rectal cancer- see above.  5. HTN -BPlooks fine. No changes made today.   6. COVID-19 Education: The signs and symptoms of COVID-19 were discussed with the patient and how to seek care for testing (follow up with PCP or arrange E-visit).  The importance of social distancing, staying at home, hand hygiene and wearing a mask when out in public were discussed today.  Patient Risk:   After full review of this patient's clinical status, I feel that they are at least moderate risk at this time.  Time:   Today, I have spent 10 minutes with the patient with telehealth technology discussing the above issues.     Medication Adjustments/Labs and Tests Ordered: Current medicines are reviewed at length with the patient today.  Concerns regarding medicines are outlined above.   Tests Ordered: No orders of the defined types were placed in this encounter.   Medication Changes: No orders of the defined types were placed in this encounter.   Disposition:  FU with me in 4 months.   Patient is agreeable to this plan and will call if any problems develop in the interim.   Amie Critchley, NP  01/17/2019 9:37 AM    [place]

## 2019-01-16 NOTE — Telephone Encounter (Signed)
Will try pt later to get pt ready for upcoming telehealth visit with Kathrene Alu on May 12 @ 9:30 am.  Pt will need a consent filled out. VM was full.

## 2019-01-16 NOTE — Telephone Encounter (Signed)

## 2019-01-17 ENCOUNTER — Other Ambulatory Visit: Payer: Self-pay

## 2019-01-17 ENCOUNTER — Telehealth (INDEPENDENT_AMBULATORY_CARE_PROVIDER_SITE_OTHER): Payer: Medicare Other | Admitting: Nurse Practitioner

## 2019-01-17 ENCOUNTER — Encounter: Payer: Self-pay | Admitting: Nurse Practitioner

## 2019-01-17 VITALS — BP 120/62 | HR 64 | Temp 97.4°F | Resp 20 | Wt 200.5 lb

## 2019-01-17 DIAGNOSIS — I4819 Other persistent atrial fibrillation: Secondary | ICD-10-CM | POA: Diagnosis not present

## 2019-01-17 DIAGNOSIS — R0602 Shortness of breath: Secondary | ICD-10-CM

## 2019-01-17 DIAGNOSIS — C20 Malignant neoplasm of rectum: Secondary | ICD-10-CM

## 2019-01-17 DIAGNOSIS — R609 Edema, unspecified: Secondary | ICD-10-CM

## 2019-01-17 DIAGNOSIS — I10 Essential (primary) hypertension: Secondary | ICD-10-CM

## 2019-01-17 MED FILL — CAPECITABINE 500 MG TABS: 500 | 28 days supply | Qty: 70 | Fill #1

## 2019-01-17 NOTE — Patient Instructions (Addendum)
After Visit Summary:  We will be checking the following labs today - NONE   Medication Instructions:    Continue with your current medicines.    If you need a refill on your cardiac medications before your next appointment, please call your pharmacy.     Testing/Procedures To Be Arranged:  N/A  Follow-Up:   See me in 4 months    At [facility], you and your health needs are our priority.  As part of our continuing mission to provide you with exceptional heart care, we have created designated Provider Care Teams.  These Care Teams include your primary Cardiologist (physician) and Advanced Practice Providers (APPs -  Physician Assistants and Nurse Practitioners) who all work together to provide you with the care you need, when you need it.  Special Instructions:  . Stay safe, stay home, wash your hands for at least 20 seconds and wear a mask when out in public.  . It was good to talk with you today.    Call the [facility] office at [phone] if you have any questions, problems or concerns.       

## 2019-01-23 NOTE — Progress Notes (Signed)
[name]   [phone] [phone]   Clinic Follow up Note   Patient Care Team: [name], MD as PCP - General (Family Medicine) Ronald Lobo, MD as Consulting Physician (Gastroenterology) Tania Ade, RN as Registered Nurse Kyung Rudd, MD as Consulting Physician (Radiation Oncology) [name], MD as Consulting Physician (Hematology)  Date of Service:  01/25/2019  CHIEF COMPLAINT: f/u metastatic rectal cancer   SUMMARY OF ONCOLOGIC HISTORY: Oncology History    Rectal cancer [facility])   Staging form: Colon and Rectum, AJCC 7th Edition     Clinical stage from 08/08/2015: Stage IIIB (T3, N1, M0) - Unsigned       Rectal cancer ([place])   07/16/2015 Pathology Results    Biopsy positive for invasive adenocarcinoma with signet ring cell features    07/23/2015 Initial Diagnosis    Rectal cancer ([place])    08/12/2015 Concurrent Chemotherapy    Xeloda '1500mg'$  q12h on the day of radiation     08/12/2015 - 09/20/2015 Radiation Therapy    radiaiton to rectal cancer     02/18/2016 Procedure    FLEX SIGMOIDOSCOPY: Endoscopic disappearance of previous rectal mass; atrophic mucosa in distal rectum  per Dr. Cristina Gong    02/18/2016 Pathology Results    02/18/2016 Surgical Pathology Diagnosis Surgical [P], rectum - MILD GLANDULAR ARCHITECTURAL CHANGES AND REPARATIVE FEATURES. - NO ADENOMATOUS CHANGE OR MALIGNANCY IDENTIFIED.    02/20/2016 Pathology Results    No adenomatous change or malignancy    05/11/2017 Imaging    CT CAP IMPRESSION: 1. Newly enlarged 1.3 cm in short axis aortocaval node, while not entirely specific, is concerning for recurrent malignancy. No other indicators of recurrence identified. Biopsy or nuclear medicine PET-CT could be utilized to provide greater specificity if clinically warranted. 2. Continued abnormal wall thickening in the inferior rectum. Surrounding perirectal and presacral stranding likely from prior radiation therapy.  3. Prostatomegaly, prostate volume 93 cubic cm. 4. Other imaging findings of potential clinical significance: Aortic Atherosclerosis (ICD10-I70.0). Coronary atherosclerosis with mild cardiomegaly. Markedly severe chronic arthropathy in the left hip. Suspected cyst in segment 6 of the liver, unchanged. Prominent stool throughout the colon favors constipation. Direct right inguinal hernia contains adipose tissue. Possible retraction of the left testicle. 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    07/28/2017 Imaging    CT AP W Contrast 07/28/17  IMPRESSION: 1. Residual rectal wall thickening, as before. Mildly enlarged aortocaval lymph node is stable. No additional evidence of metastatic disease. 2. Aortic atherosclerosis (ICD10-170.0). Three-vessel coronary artery calcification. 3. Enlarged prostate. 4. Advanced left hip osteoarthritis.    01/12/2018 Procedure     01/12/2018 Sigmoidoscopy Erythematous and febrile mucosa in the mid rectum consistent with prior chemoradiation. No evidence or local recurrence.     11/03/2018 Imaging    CT AP W Contrast  IMPRESSION: 1. Findings consistent with metastatic pelvic and retroperitoneal lymphadenopathy, which has developed/increased since the prior CT. This is presumably metastatic disease from rectal carcinoma. Retroperitoneal adenopathy partly compresses the inferior vena cava and may account for lower extremity edema. 2. There are also irregular nodules of the lung bases concerning for metastatic disease, new since the prior CT. 3. No other evidence of metastatic disease. 4. No acute findings. 5. Prostatic hypertrophy. 6. Aortic atherosclerosis.    11/15/2018 Imaging    PET  IMPRESSION: 1. Relatively mild metabolic activity associated with the iliac adenopathy and retroperitoneal periaortic adenopathy is consistent with mucinous (signet cell) rectal adenocarcinoma which has relatively  low metabolic activity. 2.  Irregular smudgy nodules within LEFT and RIGHT lung with mild metabolic activity are concerning for rectal carcinoma metastasis to the lungs. 3. No liver metastasis.     01/25/2019 -  Chemotherapy     Xeloda '1500mg'$  in the AM and '1000mg'$  in PM 1 week on/1week off starting on 12/26/18. Vectibix q2weeks added 01/25/19      CURRENT THERAPY:  Xeloda '1500mg'$  am, '1000mg'$  pm, one week on , one week off starting 12/26/18. Vectibix added 01/25/19  INTERVAL HISTORY:  Nathaniel [name] is here for a follow up and treatment. He presents to the clinic today by himself. He notes he has been tolerating Xeloda well. He notes he is still active and take care of himself and ALDs. He receives meals at [facility] and orders food. They help him with transportation for treatments. He has significant LE edema and skin erythema. He is notes he reduced his Demadex to once daily and the swelling restarted. He plans to follow up with his cardiologist.     REVIEW OF SYSTEMS:   Constitutional: Denies fevers, chills or abnormal weight loss Eyes: Denies blurriness of vision Ears, nose, mouth, throat, and face: Denies mucositis or sore throat Respiratory: Denies cough, dyspnea or wheezes Cardiovascular: Denies palpitation, chest discomfort (+) significant lower extremity swelling and skin erythema Gastrointestinal:  Denies nausea, heartburn or change in bowel habits Skin: Denies abnormal skin rashes Lymphatics: Denies new lymphadenopathy or easy bruising Neurological:Denies numbness, tingling or new weaknesses Behavioral/Psych: Mood is stable, no new changes  All other systems were reviewed with the patient and are negative.  MEDICAL HISTORY:  Past Medical History:  Diagnosis Date  . Anemia due to antineoplastic chemotherapy   . Atherosclerosis of coronary artery cardiologist-  dr Aundra Dubin   aorta and coronary arteries moderate per ct 02-07-2016  . Basal cell carcinoma    40+  . Bladder calculi    . BPH (benign prostatic hyperplasia)   . Calculi, urethra   . Chronic low back pain   . Constipation   . ED (erectile dysfunction)   . Edema of lower extremity   . Family history of melanoma   . Foley catheter in place   . History of adenomatous polyp of colon    2007  . History of basal cell carcinoma excision    multiple  . History of cellulitis    left lower leg-- resolved  . History of melanoma excision    temple right side/ multiple  . History of obstructive sleep apnea    per pt cured by hypnosis  . History of pericarditis    acute episode 09/ 2014  per cardiologist note , dr Aundra Dubin , related possibly to xarelto use  . History of ulcerative colitis    remote  . HTN (hypertension)   . Hypercholesteremia   . Hypothyroidism   . Incomplete right bundle branch block   . LAFB (left anterior fascicular block)   . Melanoma ([place])   . OA (osteoarthritis)   . PAF (paroxysmal atrial fibrillation) [facility]) cardiologist-  dr dalton Aundra Dubin   dx 09/ 2014  in setting of acute pericarditis   . Rectal cancer [facility]) dx 07-16-2015  oncologist-  dr [name]   Stage IIIB (cT3, N1, M0)-- concurrent  chemotherapy started 08-12-2015/  radiation complete (08-12-2015 to 09-20-2015)  . S/P radiation therapy 08/12/15-09/20/15   rectal ca50.4Gy total dose  . Spinal stenosis     SURGICAL HISTORY: Past Surgical History:  Procedure Laterality Date  . CATARACT EXTRACTION W/ INTRAOCULAR LENS IMPLANT Bilateral 10/ 2016  . CYSTOSCOPY WITH LITHOLAPAXY N/A 02/12/2016   Procedure: CYSTOSCOPY WITH LITHOLAPAXY;  Surgeon: Alexis Frock, MD;  Location[facility];  Service: Urology;  Laterality: N/A;  . EUS N/A 08/08/2015   Procedure: LOWER ENDOSCOPIC ULTRASOUND (EUS);  Surgeon: Arta Silence, MD;  Location: [facility] ENDOSCOPY;  Service: Endoscopy;  Laterality: N/A;  . FLEXIBLE SIGMOIDOSCOPY N/A 08/08/2015   Procedure: FLEXIBLE SIGMOIDOSCOPY;  Surgeon: Arta Silence, MD;  Location: [facility] ENDOSCOPY;   Service: Endoscopy;  Laterality: N/A;  poor prep  . HOLMIUM LASER APPLICATION N/A 1/0/6269   Procedure: HOLMIUM LASER APPLICATION;  Surgeon: Alexis Frock, MD;  Location[facility];  Service: Urology;  Laterality: N/A;  . INGUINAL HERNIA REPAIR Left 1965  . LEFT KNEE SURGERY  1940  . MOHS SURGERY  x9  last one 2014   . ORIF LEFT ANKLE FX  1935  . ROTATOR CUFF REPAIR Bilateral right 2013/  left 2010  . TRANSTHORACIC ECHOCARDIOGRAM  06-06-2013   ef 55-60%/  mild AR/  mild dilated aortic root/  mild LAE and RAE/  mild to moderate TR/  small pericardial effusion identified    I have reviewed the social history and family history with the patient and they are unchanged from previous note.  ALLERGIES:  is allergic to cinnamon; ibuprofen; percocet [oxycodone-acetaminophen]; protonix [pantoprazole sodium]; and xarelto [rivaroxaban].  MEDICATIONS:  Current Outpatient Medications  Medication Sig Dispense Refill  . aspirin EC 325 MG tablet Take 325 mg by mouth daily.     . capecitabine (XELODA) 500 MG tablet Take 3 tablets ('1500mg'$ ) by mouth in AM & 2 tabs ('1000mg'$ ) in PM, immediately after food, take on days 1-7 & 15-21 of each 28d cycle 70 tablet 1  . finasteride (PROSCAR) 5 MG tablet Take 5 mg by mouth every evening. Dose is taken at bedtime    . levothyroxine (SYNTHROID) 112 MCG tablet Take 1 tablet (112 mcg total) by mouth daily before breakfast. 90 tablet 3  . metoprolol succinate (TOPROL-XL) 25 MG 24 hr tablet TAKE 1/2 TABLET BY MOUTH DAILY. 45 tablet 2  . Multiple Vitamin (MULTIVITAMIN WITH MINERALS) TABS tablet Take 1 tablet by mouth daily.    . potassium chloride (K-DUR) 10 MEQ tablet TAKE 1 TABLET BY MOUTH EVERY DAY 90 tablet 3  . torsemide (DEMADEX) 20 MG tablet Take 20 mg by mouth daily.    . clindamycin (CLINDAGEL) 1 % gel Apply topically 2 (two) times daily. 60 g 2  . ondansetron (ZOFRAN) 4 MG tablet Take 1-2 tablets (4-8 mg total) by mouth every 8 (eight) hours as  needed for nausea or vomiting. (Patient not taking: Reported on 01/25/2019) 30 tablet 0   No current facility-administered medications for this visit.     PHYSICAL EXAMINATION: ECOG PERFORMANCE STATUS: 2 - Symptomatic, <50% confined to bed  Vitals:   01/25/19 1302  BP: (!) 153/69  Pulse: 89  Resp: 20  Temp: 98.2 F (36.8 C)  SpO2: 94%   Filed Weights   01/25/19 1302  Weight: 203 lb (92.1 kg)    GENERAL:alert, no distress and comfortable SKIN: skin color, texture, turgor are normal, no rashes or significant lesions EYES: normal, Conjunctiva are pink and non-injected, sclera clear OROPHARYNX:no exudate, no erythema and lips, buccal mucosa, and tongue normal  NECK: supple, thyroid normal size, non-tender, without nodularity LYMPH:  no palpable lymphadenopathy in the cervical, axillary or inguinal LUNGS: clear to auscultation  and percussion with normal breathing effort HEART: regular rate & rhythm and no murmurs (+) Worsening b/l lower extremity edema with skin erythema ABDOMEN:abdomen soft, non-tender and normal bowel sounds Musculoskeletal:no cyanosis of digits and no clubbing  NEURO: alert & oriented x 3 with fluent speech, no focal motor/sensory deficits  LABORATORY DATA:  I have reviewed the data as listed CBC Latest Ref Rng & Units 01/25/2019 11/10/2018 09/28/2018  WBC 4.0 - 10.5 K/uL 4.2 5.8 6.5  Hemoglobin 13.0 - 17.0 g/dL 11.9(L) 11.9(L) 13.5  Hematocrit 39.0 - 52.0 % 36.9(L) 36.8(L) 39.6  Platelets 150 - 400 K/uL 207 236 252     CMP Latest Ref Rng & Units 01/25/2019 11/10/2018 11/01/2018  Glucose 70 - 99 mg/dL 103(H) 114(H) 94  BUN 8 - 23 mg/dL 21 24(H) 26  Creatinine 0.61 - 1.24 mg/dL 1.52(H) 1.19 1.02  Sodium 135 - 145 mmol/L 138 136 139  Potassium 3.5 - 5.1 mmol/L 4.3 4.5 4.9  Chloride 98 - 111 mmol/L 98 99 98  CO2 22 - 32 mmol/L '28 28 26  '$ Calcium 8.9 - 10.3 mg/dL 9.3 9.2 9.4  Total Protein 6.5 - 8.1 g/dL 7.2 6.8 -  Total Bilirubin 0.3 - 1.2 mg/dL 0.7 0.6 -   Alkaline Phos 38 - 126 U/L 116 109 -  AST 15 - 41 U/L 22 23 -  ALT 0 - 44 U/L 16 16 -      RADIOGRAPHIC STUDIES: I have personally reviewed the radiological images as listed and agreed with the findings in the report. No results found.   ASSESSMENT & PLAN:  Nathaniel [name] is a 85 y.o. male with   1. Low rectal adenocarcinoma, cT3N1M0, stage IIIB, with nodes metastatic recurrence in 11/2018, KRAS/NRAS/BRAF wild type, MSS  -He was diagnosed on 07/23/2015. He is s/p concurrent chemotherapy and radiation.He opted not to have surgery due to his advanced age. His back pain resolved after treatment.  -His genetic testing was negative -He recently went to his cardiologist due to complains of worsening lower extremity edema and had a CT scans done on 11/03/18 which showed significant abdominal and pelvic adenopathy, highly suspicious for metastatic disease.  -His PET scan from November 15, 2018, which showed mild hypermetabolic lymphadenopathy in pelvic and retroperitoneum, consistent with metastatic disease from his mucinous rectal adenocarcinoma. Due to his advanced age, I do not think biopsy is needed. -Pt declined palliative radiation due to his severe spinal stenosis  -He started Xeloda '1500mg'$  in the AM and '1000mg'$  in PM 1 week on/1 week off on 12/26/18. Will add Vectibix q2weeks today. Potential side effects reviewed again, especially management of skin rash, I called in clindamycin gel and he will buy OTC hydrocortisone cream -He has tolerated Xeloda well with mild increase in fatigue.  -Labs reviewed, CBC and CMP WNL except hg 11.9, BG 103, Cr 1.52. I strongly encouraged him to drink plenty of water. Overall adequate to proceed with Vectibix today. Will watch for skin rash. I called in clindamycin gel and encouraged him to use OTC hydrocortisone cream.  -If kidney function does not improved, may need to hold or decrease Xeloda.  -f/u in 2 weeks    2. Worsening Lower extremity edema  -On  Torsemide 40 mg daily  -Possible related to RP adenopathy -pt declined palliative RT  -Has worsened with skin erythema again once his cardiologist recently reduced his Torsemide to '20mg'$  daily.  -Given Cr at 1.52 today (01/25/19), I advised him to hold Torsemide for 3 days,  increase water intake an elevate his legs when sitting.  -I strongly encouraged him to f/u with cardiologist about this. I copied my note to [name].    3. HTN, AF, arthritis -F/u with PCP -BP at 152/69 today (01/25/19)  4.History of skin cancer including melanoma -F/u with Dermatologist  5. AKI -Cr increased from 1.19 2 months ago to 1.52 today, possible related to diuretics or Xeloda  -We will hold torsemide for 3 days, we discussed with cardiology  6. Goal of care discussion, DNR/DNI   -We discussed the incurable nature of his cancer, and the overall poor prognosis, especially if he does not have good response to chemotherapy or progress on chemo -The patient understands the goal of care is palliative. -Pt agreed DNR/DNI  Plan -I called in clindamycin gel, he will buy OTC hydrocortisone cream and hold Torsemide for 3 days.  -Labs reviewed and adequate to proceed with Vectibix today, continue every 2 weeks  -Continue Xeloda '1500mg'$  in the AM and '1000mg'$  in PM 1 week on/1week off -Lab, f/u with me or Lacie and vectibix in 2, 4 and 6 weeks    No problem-specific Assessment & Plan notes found for this encounter.   No orders of the defined types were placed in this encounter.  All questions were answered. The patient knows to call the clinic with any problems, questions or concerns. No barriers to learning was detected. I spent 20 minutes counseling the patient face to face. The total time spent in the appointment was 25 minutes and more than 50% was on counseling and review of test results     [name], MD 01/25/2019   I, Joslyn Devon, am acting as scribe for [name], MD.   I have reviewed  the above documentation for accuracy and completeness, and I agree with the above.

## 2019-01-25 ENCOUNTER — Inpatient Hospital Stay: Payer: Medicare Other

## 2019-01-25 ENCOUNTER — Inpatient Hospital Stay: Payer: Medicare Other | Attending: Hematology

## 2019-01-25 ENCOUNTER — Telehealth: Payer: Self-pay | Admitting: Hematology

## 2019-01-25 ENCOUNTER — Other Ambulatory Visit: Payer: Self-pay

## 2019-01-25 ENCOUNTER — Encounter: Payer: Self-pay | Admitting: Hematology

## 2019-01-25 ENCOUNTER — Inpatient Hospital Stay (HOSPITAL_BASED_OUTPATIENT_CLINIC_OR_DEPARTMENT_OTHER): Payer: Medicare Other | Admitting: Hematology

## 2019-01-25 VITALS — BP 153/69 | HR 89 | Temp 98.2°F | Resp 20 | Ht 73.0 in | Wt 203.0 lb

## 2019-01-25 DIAGNOSIS — R590 Localized enlarged lymph nodes: Secondary | ICD-10-CM | POA: Diagnosis not present

## 2019-01-25 DIAGNOSIS — C20 Malignant neoplasm of rectum: Secondary | ICD-10-CM | POA: Diagnosis present

## 2019-01-25 DIAGNOSIS — Z79899 Other long term (current) drug therapy: Secondary | ICD-10-CM | POA: Insufficient documentation

## 2019-01-25 DIAGNOSIS — Z5112 Encounter for antineoplastic immunotherapy: Secondary | ICD-10-CM | POA: Insufficient documentation

## 2019-01-25 DIAGNOSIS — D6481 Anemia due to antineoplastic chemotherapy: Secondary | ICD-10-CM | POA: Diagnosis not present

## 2019-01-25 DIAGNOSIS — I4819 Other persistent atrial fibrillation: Secondary | ICD-10-CM | POA: Diagnosis not present

## 2019-01-25 DIAGNOSIS — I2510 Atherosclerotic heart disease of native coronary artery without angina pectoris: Secondary | ICD-10-CM | POA: Insufficient documentation

## 2019-01-25 DIAGNOSIS — I10 Essential (primary) hypertension: Secondary | ICD-10-CM | POA: Diagnosis not present

## 2019-01-25 DIAGNOSIS — E039 Hypothyroidism, unspecified: Secondary | ICD-10-CM | POA: Diagnosis not present

## 2019-01-25 DIAGNOSIS — Z7982 Long term (current) use of aspirin: Secondary | ICD-10-CM | POA: Diagnosis not present

## 2019-01-25 DIAGNOSIS — T451X5A Adverse effect of antineoplastic and immunosuppressive drugs, initial encounter: Secondary | ICD-10-CM

## 2019-01-25 DIAGNOSIS — R600 Localized edema: Secondary | ICD-10-CM | POA: Insufficient documentation

## 2019-01-25 DIAGNOSIS — G8929 Other chronic pain: Secondary | ICD-10-CM | POA: Diagnosis not present

## 2019-01-25 LAB — CBC WITH DIFFERENTIAL (CANCER CENTER ONLY)
Abs Immature Granulocytes: 0.01 10*3/uL (ref 0.00–0.07)
Basophils Absolute: 0 10*3/uL (ref 0.0–0.1)
Basophils Relative: 1 %
Eosinophils Absolute: 0.1 10*3/uL (ref 0.0–0.5)
Eosinophils Relative: 2 %
HCT: 36.9 % — ABNORMAL LOW (ref 39.0–52.0)
Hemoglobin: 11.9 g/dL — ABNORMAL LOW (ref 13.0–17.0)
Immature Granulocytes: 0 %
Lymphocytes Relative: 13 %
Lymphs Abs: 0.6 10*3/uL — ABNORMAL LOW (ref 0.7–4.0)
MCH: 31.2 pg (ref 26.0–34.0)
MCHC: 32.2 g/dL (ref 30.0–36.0)
MCV: 96.9 fL (ref 80.0–100.0)
Monocytes Absolute: 0.7 10*3/uL (ref 0.1–1.0)
Monocytes Relative: 17 %
Neutro Abs: 2.9 10*3/uL (ref 1.7–7.7)
Neutrophils Relative %: 67 %
Platelet Count: 207 10*3/uL (ref 150–400)
RBC: 3.81 MIL/uL — ABNORMAL LOW (ref 4.22–5.81)
RDW: 16.4 % — ABNORMAL HIGH (ref 11.5–15.5)
WBC Count: 4.2 10*3/uL (ref 4.0–10.5)
nRBC: 0 % (ref 0.0–0.2)

## 2019-01-25 LAB — CMP (CANCER CENTER ONLY)
ALT: 16 U/L (ref 0–44)
AST: 22 U/L (ref 15–41)
Albumin: 3.7 g/dL (ref 3.5–5.0)
Alkaline Phosphatase: 116 U/L (ref 38–126)
Anion gap: 12 (ref 5–15)
BUN: 21 mg/dL (ref 8–23)
CO2: 28 mmol/L (ref 22–32)
Calcium: 9.3 mg/dL (ref 8.9–10.3)
Chloride: 98 mmol/L (ref 98–111)
Creatinine: 1.52 mg/dL — ABNORMAL HIGH (ref 0.61–1.24)
GFR, Est AFR Am: 44 mL/min — ABNORMAL LOW (ref >60)
GFR, Estimated: 38 mL/min — ABNORMAL LOW (ref >60)
Glucose, Bld: 103 mg/dL — ABNORMAL HIGH (ref 70–99)
Potassium: 4.3 mmol/L (ref 3.5–5.1)
Sodium: 138 mmol/L (ref 135–145)
Total Bilirubin: 0.7 mg/dL (ref 0.3–1.2)
Total Protein: 7.2 g/dL (ref 6.5–8.1)

## 2019-01-25 LAB — MAGNESIUM: Magnesium: 2.2 mg/dL (ref 1.7–2.4)

## 2019-01-25 MED ORDER — SODIUM CHLORIDE 0.9 % IV SOLN
Freq: Once | INTRAVENOUS | Status: AC
  Administered 2019-01-25: 14:00:00 via INTRAVENOUS
  Filled 2019-01-25: qty 250

## 2019-01-25 MED ORDER — CLINDAMYCIN PHOSPHATE 1 % EX GEL: Freq: Two times a day (BID) | CUTANEOUS | 2 refills | Status: DC

## 2019-01-25 MED ORDER — SODIUM CHLORIDE 0.9 % IV SOLN
5.5500 mg/kg | Freq: Once | INTRAVENOUS | Status: AC
  Administered 2019-01-25: 15:00:00 500 mg via INTRAVENOUS
  Filled 2019-01-25: qty 20

## 2019-01-25 NOTE — Patient Instructions (Signed)
[facility] Discharge Instructions for Patients Receiving Chemotherapy  Today you received the following chemotherapy agents Panitumumab (VECTIBIX).  To help prevent nausea and vomiting after your treatment, we encourage you to take your nausea medication as prescribed.   If you develop nausea and vomiting that is not controlled by your nausea medication, call the clinic.   BELOW ARE SYMPTOMS THAT SHOULD BE REPORTED IMMEDIATELY:  *FEVER GREATER THAN 100.5 F  *CHILLS WITH OR WITHOUT FEVER  NAUSEA AND VOMITING THAT IS NOT CONTROLLED WITH YOUR NAUSEA MEDICATION  *UNUSUAL SHORTNESS OF BREATH  *UNUSUAL BRUISING OR BLEEDING  TENDERNESS IN MOUTH AND THROAT WITH OR WITHOUT PRESENCE OF ULCERS  *URINARY PROBLEMS  *BOWEL PROBLEMS  UNUSUAL RASH Items with * indicate a potential emergency and should be followed up as soon as possible.  Feel free to call the clinic should you have any questions or concerns. The clinic phone number is [phone].  Please show the [place] at check-in to the Emergency Department and triage nurse.  Panitumumab Solution for Injection What is this medicine? PANITUMUMAB (pan i TOOM ue mab) is a monoclonal antibody. It is used to treat colorectal cancer. This medicine may be used for other purposes; ask your health care provider or pharmacist if you have questions. COMMON BRAND NAME(S): Vectibix What should I tell my health care provider before I take this medicine? They need to know if you have any of these conditions: -eye disease, vision problems -low levels of calcium, magnesium, or potassium in the blood -lung or breathing disease, like asthma -skin conditions or sensitivity -an unusual or allergic reaction to panitumumab, other medicines, foods, dyes, or preservatives -pregnant or trying to get pregnant -breast-feeding How should I use this medicine? This drug is given as an infusion into a vein. It is administered in a  hospital or clinic by a specially trained health care professional. Talk to your pediatrician regarding the use of this medicine in children. Special care may be needed. Overdosage: If you think you have taken too much of this medicine contact a poison control center or emergency room at once. NOTE: This medicine is only for you. Do not share this medicine with others. What if I miss a dose? It is important not to miss your dose. Call your doctor or health care professional if you are unable to keep an appointment. What may interact with this medicine? Do not take this medicine with any of the following medications: -bevacizumab This list may not describe all possible interactions. Give your health care provider a list of all the medicines, herbs, non-prescription drugs, or dietary supplements you use. Also tell them if you smoke, drink alcohol, or use illegal drugs. Some items may interact with your medicine. What should I watch for while using this medicine? Visit your doctor for checks on your progress. This drug may make you feel generally unwell. This is not uncommon, as chemotherapy can affect healthy cells as well as cancer cells. Report any side effects. Continue your course of treatment even though you feel ill unless your doctor tells you to stop. This medicine can make you more sensitive to the sun. Keep out of the sun while receiving this medicine and for 2 months after the last dose. If you cannot avoid being in the sun, wear protective clothing and use sunscreen. Do not use sun lamps or tanning beds/booths. In some cases, you may be given additional medicines to help with side effects. Follow all directions for  their use. Call your doctor or health care professional for advice if you get a fever, chills or sore throat, or other symptoms of a cold or flu. Do not treat yourself. This drug decreases your body's ability to fight infections. Try to avoid being around people who are  sick. Avoid taking products that contain aspirin, acetaminophen, ibuprofen, naproxen, or ketoprofen unless instructed by your doctor. These medicines may hide a fever. Do not become pregnant while taking this medicine and for 2 months after the last dose. Women should inform their doctor if they wish to become pregnant or think they might be pregnant. There is a potential for serious side effects to an unborn child. Talk to your health care professional or pharmacist for more information. Do not breast-feed an infant while taking this medicine or for 2 months after the last dose. What side effects may I notice from receiving this medicine? Side effects that you should report to your doctor or health care professional as soon as possible: -allergic reactions like skin rash, itching or hives, swelling of the face, lips, or tongue -breathing problems -changes in vision -eye pain -fast, irregular heartbeat -fever, chills -mouth sores -red spots on the skin -redness, blistering, peeling or loosening of the skin, including inside the mouth -signs and symptoms of kidney injury like trouble passing urine or change in the amount of urine -signs and symptoms of low blood pressure like dizziness; feeling faint or lightheaded, falls; unusually weak or tired -signs of low calcium like fast heartbeat, muscle cramps or muscle pain; pain, tingling, numbness in the hands or feet; seizures -signs and symptoms of low magnesium like muscle cramps, pain, or weakness; tremors; seizures; or fast, irregular heartbeat -signs and symptoms of low potassium like muscle cramps or muscle pain; chest pain; dizziness; feeling faint or lightheaded, falls; palpitations; breathing problems; or fast, irregular heartbeat -swelling of the ankles, feet, hands Side effects that usually do not require medical attention (report to your doctor or health care professional if they continue or are bothersome): -changes in skin like acne,  cracks, skin dryness -diarrhea -eyelash growth -headache -mouth sores -nail changes -nausea, vomiting This list may not describe all possible side effects. Call your doctor for medical advice about side effects. You may report side effects to FDA at 1-800-FDA-1088. Where should I keep my medicine? This drug is given in a hospital or clinic and will not be stored at home. NOTE: This sheet is a summary. It may not cover all possible information. If you have questions about this medicine, talk to your doctor, pharmacist, or health care provider.  2019 Elsevier/Gold Standard (2016-03-13 16:45:04)  Coronavirus (COVID-19) Are you at risk?  Are you at risk for the Coronavirus (COVID-19)?  To be considered HIGH RISK for Coronavirus (COVID-19), you have to meet the following criteria:  . Traveled to [place], [place], [place], [place] or [place]; or in the [place] to [place], [place], [place], or [place]; and have fever, cough, and shortness of breath within the last 2 weeks of travel OR . Been in close contact with a person diagnosed with COVID-19 within the last 2 weeks and have fever, cough, and shortness of breath . IF YOU DO NOT MEET THESE CRITERIA, YOU ARE CONSIDERED LOW RISK FOR COVID-19.  What to do if you are HIGH RISK for COVID-19?  Marland Kitchen If you are having a medical emergency, call 911. . Seek medical care right away. Before you go to a doctor's office, urgent  care or emergency department, call ahead and tell them about your recent travel, contact with someone diagnosed with COVID-19, and your symptoms. You should receive instructions from your physician's office regarding next steps of care.  . When you arrive at healthcare provider, tell the healthcare staff immediately you have returned from visiting [place], [place], [place], [place] or [place]; or traveled in the [place] to [place], [place], [place], or [place]; in the last two weeks or you have been in close  contact with a person diagnosed with COVID-19 in the last 2 weeks.   . Tell the health care staff about your symptoms: fever, cough and shortness of breath. . After you have been seen by a medical provider, you will be either: o Tested for (COVID-19) and discharged home on quarantine except to seek medical care if symptoms worsen, and asked to  - Stay home and avoid contact with others until you get your results (4-5 days)  - Avoid travel on public transportation if possible (such as bus, train, or airplane) or o Sent to the Emergency Department by EMS for evaluation, COVID-19 testing, and possible admission depending on your condition and test results.  What to do if you are LOW RISK for COVID-19?  Reduce your risk of any infection by using the same precautions used for avoiding the common cold or flu:  Marland Kitchen Wash your hands often with soap and warm water for at least 20 seconds.  If soap and water are not readily available, use an alcohol-based hand sanitizer with at least 60% alcohol.  . If coughing or sneezing, cover your mouth and nose by coughing or sneezing into the elbow areas of your shirt or coat, into a tissue or into your sleeve (not your hands). . Avoid shaking hands with others and consider head nods or verbal greetings only. . Avoid touching your eyes, nose, or mouth with unwashed hands.  . Avoid close contact with people who are sick. . Avoid places or events with large numbers of people in one location, like concerts or sporting events. . Carefully consider travel plans you have or are making. . If you are planning any travel outside or inside the [place], visit the CDC's Travelers' Health webpage for the latest health notices. . If you have some symptoms but not all symptoms, continue to monitor at home and seek medical attention if your symptoms worsen. . If you are having a medical emergency, call 911.   [place] / e-Visit:  [email or web address]         [facility]: [facility]: [phone]                   [facility]: [phone]

## 2019-01-25 NOTE — Progress Notes (Signed)
Spoke w/ Dr. Burr Medico - baseline Mg level was not drawn for initiation of panitumumab. She would like Mg level added on for today but do not await result to start treatment.   Mg level ordered/lab notified to add this on for today. Standing Mg level lab ordered for every 2 weeks for future infusion appts.   Demetrius Charity, PharmD, [facility] Phone: [phone] 01/25/2019

## 2019-01-25 NOTE — Telephone Encounter (Signed)
Scheduled appt per 5/20 los.  Treatment nurse will print out appt calendar for patient.

## 2019-01-26 ENCOUNTER — Telehealth: Payer: Self-pay | Admitting: *Deleted

## 2019-01-26 ENCOUNTER — Telehealth: Payer: Self-pay

## 2019-01-26 NOTE — Telephone Encounter (Signed)
-----   Message from Georgianne Fick, RN sent at 01/25/2019  4:13 PM EDT ----- Regarding: Dr. Burr Medico First time Vectibix Patient received first time Vectibix and tolerated it well.

## 2019-01-26 NOTE — Telephone Encounter (Signed)
Attempted to call patient to check on him from treatment yesterday.  There was no answer and mail box is full.

## 2019-01-26 NOTE — Telephone Encounter (Signed)
Patient called back I attempted to reach again no answer and no voice mail option.

## 2019-01-26 NOTE — Telephone Encounter (Signed)
S/w pt is aware of recommendation's.  Pt will hold torsemide, one tablet (20mg ) daily, confirmed dosage,  for 3 days, pt will start back up on current dose on Sunday.  Pt also advised to have good fluid intake, preferably water.

## 2019-01-26 NOTE — Telephone Encounter (Signed)
-----   Message from Burtis Junes, NP sent at 01/26/2019  8:24 AM EDT ----- I suspect this bump in his creatinine is related to both - ok to resume his Torsemide in 3 days. Clarify if he is only taking 20 mg - if so, that's fine. He needs to make sure he has good fluid intake too.   lori ----- Message ----- From: [name], MD Sent: 01/25/2019  10:40 PM EDT To: Burtis Junes, NP  Cecille Rubin,  I started him on oral chemo Xeloda about 4 weeks ago, he is tolerating well but his Cr went up to 1.5. he has been on torsemide for long time, and he recently decreased the dose. His leg edema is getting worse, he would like talk to you.   I think Cr elevation cold be related to torsemide and or Xeloda, I told him to hold torsemide for 3 days, or until he speaks with you.  Thanks much,  [name]

## 2019-02-08 ENCOUNTER — Inpatient Hospital Stay (HOSPITAL_BASED_OUTPATIENT_CLINIC_OR_DEPARTMENT_OTHER): Payer: Medicare Other | Admitting: Nurse Practitioner

## 2019-02-08 ENCOUNTER — Inpatient Hospital Stay: Payer: Medicare Other

## 2019-02-08 ENCOUNTER — Other Ambulatory Visit: Payer: Self-pay | Admitting: Hematology

## 2019-02-08 ENCOUNTER — Other Ambulatory Visit: Payer: Self-pay

## 2019-02-08 ENCOUNTER — Encounter: Payer: Self-pay | Admitting: Nurse Practitioner

## 2019-02-08 ENCOUNTER — Inpatient Hospital Stay: Payer: Medicare Other | Attending: Hematology

## 2019-02-08 VITALS — BP 149/69 | HR 93 | Temp 98.2°F | Resp 17 | Ht 73.0 in | Wt 200.4 lb

## 2019-02-08 DIAGNOSIS — C20 Malignant neoplasm of rectum: Secondary | ICD-10-CM

## 2019-02-08 DIAGNOSIS — M545 Low back pain: Secondary | ICD-10-CM

## 2019-02-08 DIAGNOSIS — R531 Weakness: Secondary | ICD-10-CM | POA: Insufficient documentation

## 2019-02-08 DIAGNOSIS — L539 Erythematous condition, unspecified: Secondary | ICD-10-CM | POA: Insufficient documentation

## 2019-02-08 DIAGNOSIS — R918 Other nonspecific abnormal finding of lung field: Secondary | ICD-10-CM

## 2019-02-08 DIAGNOSIS — G8929 Other chronic pain: Secondary | ICD-10-CM | POA: Diagnosis not present

## 2019-02-08 DIAGNOSIS — Z5112 Encounter for antineoplastic immunotherapy: Secondary | ICD-10-CM | POA: Diagnosis not present

## 2019-02-08 DIAGNOSIS — L709 Acne, unspecified: Secondary | ICD-10-CM | POA: Insufficient documentation

## 2019-02-08 DIAGNOSIS — Z85820 Personal history of malignant melanoma of skin: Secondary | ICD-10-CM | POA: Insufficient documentation

## 2019-02-08 DIAGNOSIS — Z79899 Other long term (current) drug therapy: Secondary | ICD-10-CM

## 2019-02-08 DIAGNOSIS — N179 Acute kidney failure, unspecified: Secondary | ICD-10-CM | POA: Diagnosis not present

## 2019-02-08 DIAGNOSIS — M1612 Unilateral primary osteoarthritis, left hip: Secondary | ICD-10-CM

## 2019-02-08 DIAGNOSIS — N401 Enlarged prostate with lower urinary tract symptoms: Secondary | ICD-10-CM | POA: Diagnosis not present

## 2019-02-08 DIAGNOSIS — R600 Localized edema: Secondary | ICD-10-CM

## 2019-02-08 DIAGNOSIS — M1990 Unspecified osteoarthritis, unspecified site: Secondary | ICD-10-CM

## 2019-02-08 DIAGNOSIS — R609 Edema, unspecified: Secondary | ICD-10-CM | POA: Insufficient documentation

## 2019-02-08 DIAGNOSIS — Z885 Allergy status to narcotic agent status: Secondary | ICD-10-CM | POA: Insufficient documentation

## 2019-02-08 DIAGNOSIS — L03115 Cellulitis of right lower limb: Secondary | ICD-10-CM

## 2019-02-08 DIAGNOSIS — R590 Localized enlarged lymph nodes: Secondary | ICD-10-CM | POA: Diagnosis not present

## 2019-02-08 DIAGNOSIS — I2510 Atherosclerotic heart disease of native coronary artery without angina pectoris: Secondary | ICD-10-CM | POA: Diagnosis not present

## 2019-02-08 DIAGNOSIS — G4733 Obstructive sleep apnea (adult) (pediatric): Secondary | ICD-10-CM | POA: Diagnosis not present

## 2019-02-08 DIAGNOSIS — I10 Essential (primary) hypertension: Secondary | ICD-10-CM

## 2019-02-08 DIAGNOSIS — R21 Rash and other nonspecific skin eruption: Secondary | ICD-10-CM | POA: Insufficient documentation

## 2019-02-08 DIAGNOSIS — I700 Atherosclerosis of aorta: Secondary | ICD-10-CM

## 2019-02-08 DIAGNOSIS — K121 Other forms of stomatitis: Secondary | ICD-10-CM | POA: Diagnosis not present

## 2019-02-08 DIAGNOSIS — I480 Paroxysmal atrial fibrillation: Secondary | ICD-10-CM

## 2019-02-08 DIAGNOSIS — Z886 Allergy status to analgesic agent status: Secondary | ICD-10-CM

## 2019-02-08 DIAGNOSIS — Z923 Personal history of irradiation: Secondary | ICD-10-CM | POA: Insufficient documentation

## 2019-02-08 DIAGNOSIS — R634 Abnormal weight loss: Secondary | ICD-10-CM | POA: Insufficient documentation

## 2019-02-08 DIAGNOSIS — Z9221 Personal history of antineoplastic chemotherapy: Secondary | ICD-10-CM | POA: Insufficient documentation

## 2019-02-08 LAB — CMP (CANCER CENTER ONLY)
ALT: 12 U/L (ref 0–44)
AST: 22 U/L (ref 15–41)
Albumin: 3.5 g/dL (ref 3.5–5.0)
Alkaline Phosphatase: 114 U/L (ref 38–126)
Anion gap: 12 (ref 5–15)
BUN: 30 mg/dL — ABNORMAL HIGH (ref 8–23)
CO2: 25 mmol/L (ref 22–32)
Calcium: 9 mg/dL (ref 8.9–10.3)
Chloride: 100 mmol/L (ref 98–111)
Creatinine: 1.55 mg/dL — ABNORMAL HIGH (ref 0.61–1.24)
GFR, Est AFR Am: 43 mL/min — ABNORMAL LOW (ref >60)
GFR, Estimated: 37 mL/min — ABNORMAL LOW (ref >60)
Glucose, Bld: 94 mg/dL (ref 70–99)
Potassium: 4.1 mmol/L (ref 3.5–5.1)
Sodium: 137 mmol/L (ref 135–145)
Total Bilirubin: 0.6 mg/dL (ref 0.3–1.2)
Total Protein: 6.7 g/dL (ref 6.5–8.1)

## 2019-02-08 LAB — CBC WITH DIFFERENTIAL (CANCER CENTER ONLY)
Abs Immature Granulocytes: 0.02 10*3/uL (ref 0.00–0.07)
Basophils Absolute: 0 10*3/uL (ref 0.0–0.1)
Basophils Relative: 1 %
Eosinophils Absolute: 0.2 10*3/uL (ref 0.0–0.5)
Eosinophils Relative: 3 %
HCT: 35.2 % — ABNORMAL LOW (ref 39.0–52.0)
Hemoglobin: 11.5 g/dL — ABNORMAL LOW (ref 13.0–17.0)
Immature Granulocytes: 0 %
Lymphocytes Relative: 12 %
Lymphs Abs: 0.7 10*3/uL (ref 0.7–4.0)
MCH: 31.7 pg (ref 26.0–34.0)
MCHC: 32.7 g/dL (ref 30.0–36.0)
MCV: 97 fL (ref 80.0–100.0)
Monocytes Absolute: 1 10*3/uL (ref 0.1–1.0)
Monocytes Relative: 17 %
Neutro Abs: 4.1 10*3/uL (ref 1.7–7.7)
Neutrophils Relative %: 67 %
Platelet Count: 218 10*3/uL (ref 150–400)
RBC: 3.63 MIL/uL — ABNORMAL LOW (ref 4.22–5.81)
RDW: 18 % — ABNORMAL HIGH (ref 11.5–15.5)
WBC Count: 6 10*3/uL (ref 4.0–10.5)
nRBC: 0 % (ref 0.0–0.2)

## 2019-02-08 LAB — MAGNESIUM: Magnesium: 2 mg/dL (ref 1.7–2.4)

## 2019-02-08 LAB — CEA (IN HOUSE-CHCC): CEA (CHCC-In House): 3.58 ng/mL (ref 0.00–5.00)

## 2019-02-08 MED ORDER — SODIUM CHLORIDE 0.9 % IV SOLN
Freq: Once | INTRAVENOUS | Status: AC
  Administered 2019-02-08: 13:00:00 via INTRAVENOUS
  Filled 2019-02-08: qty 250

## 2019-02-08 MED ORDER — CEPHALEXIN 500 MG PO CAPS: 500.0000 mg | ORAL_CAPSULE | Freq: Two times a day (BID) | ORAL | 0 refills | Status: DC

## 2019-02-08 MED ORDER — SODIUM CHLORIDE 0.9 % IV SOLN
5.6000 mg/kg | Freq: Once | INTRAVENOUS | Status: AC
  Administered 2019-02-08: 500 mg via INTRAVENOUS
  Filled 2019-02-08: qty 5

## 2019-02-08 MED ORDER — SODIUM CHLORIDE 0.9% FLUSH
10.0000 mL | INTRAVENOUS | Status: DC | PRN
  Filled 2019-02-08: qty 10

## 2019-02-08 MED ORDER — HEPARIN SOD (PORK) LOCK FLUSH 100 UNIT/ML IV SOLN
500.0000 [IU] | Freq: Once | INTRAVENOUS | Status: DC | PRN
  Filled 2019-02-08: qty 5

## 2019-02-08 NOTE — Progress Notes (Signed)
Ok to tx w/ Creatinine 1.55 per Lanora Manis NP

## 2019-02-08 NOTE — Patient Instructions (Signed)
[facility] Discharge Instructions for Patients Receiving Chemotherapy  Today you received the following chemotherapy agents Panitumumab (VECTIBIX).  To help prevent nausea and vomiting after your treatment, we encourage you to take your nausea medication as prescribed.   If you develop nausea and vomiting that is not controlled by your nausea medication, call the clinic.   BELOW ARE SYMPTOMS THAT SHOULD BE REPORTED IMMEDIATELY:  *FEVER GREATER THAN 100.5 F  *CHILLS WITH OR WITHOUT FEVER  NAUSEA AND VOMITING THAT IS NOT CONTROLLED WITH YOUR NAUSEA MEDICATION  *UNUSUAL SHORTNESS OF BREATH  *UNUSUAL BRUISING OR BLEEDING  TENDERNESS IN MOUTH AND THROAT WITH OR WITHOUT PRESENCE OF ULCERS  *URINARY PROBLEMS  *BOWEL PROBLEMS  UNUSUAL RASH Items with * indicate a potential emergency and should be followed up as soon as possible.  Feel free to call the clinic should you have any questions or concerns. The clinic phone number is [phone].  Please show the [place] at check-in to the Emergency Department and triage nurse.  Panitumumab Solution for Injection What is this medicine? PANITUMUMAB (pan i TOOM ue mab) is a monoclonal antibody. It is used to treat colorectal cancer. This medicine may be used for other purposes; ask your health care provider or pharmacist if you have questions. COMMON BRAND NAME(S): Vectibix What should I tell my health care provider before I take this medicine? They need to know if you have any of these conditions: -eye disease, vision problems -low levels of calcium, magnesium, or potassium in the blood -lung or breathing disease, like asthma -skin conditions or sensitivity -an unusual or allergic reaction to panitumumab, other medicines, foods, dyes, or preservatives -pregnant or trying to get pregnant -breast-feeding How should I use this medicine? This drug is given as an infusion into a vein. It is administered in a  hospital or clinic by a specially trained health care professional. Talk to your pediatrician regarding the use of this medicine in children. Special care may be needed. Overdosage: If you think you have taken too much of this medicine contact a poison control center or emergency room at once. NOTE: This medicine is only for you. Do not share this medicine with others. What if I miss a dose? It is important not to miss your dose. Call your doctor or health care professional if you are unable to keep an appointment. What may interact with this medicine? Do not take this medicine with any of the following medications: -bevacizumab This list may not describe all possible interactions. Give your health care provider a list of all the medicines, herbs, non-prescription drugs, or dietary supplements you use. Also tell them if you smoke, drink alcohol, or use illegal drugs. Some items may interact with your medicine. What should I watch for while using this medicine? Visit your doctor for checks on your progress. This drug may make you feel generally unwell. This is not uncommon, as chemotherapy can affect healthy cells as well as cancer cells. Report any side effects. Continue your course of treatment even though you feel ill unless your doctor tells you to stop. This medicine can make you more sensitive to the sun. Keep out of the sun while receiving this medicine and for 2 months after the last dose. If you cannot avoid being in the sun, wear protective clothing and use sunscreen. Do not use sun lamps or tanning beds/booths. In some cases, you may be given additional medicines to help with side effects. Follow all directions for  their use. Call your doctor or health care professional for advice if you get a fever, chills or sore throat, or other symptoms of a cold or flu. Do not treat yourself. This drug decreases your body's ability to fight infections. Try to avoid being around people who are  sick. Avoid taking products that contain aspirin, acetaminophen, ibuprofen, naproxen, or ketoprofen unless instructed by your doctor. These medicines may hide a fever. Do not become pregnant while taking this medicine and for 2 months after the last dose. Women should inform their doctor if they wish to become pregnant or think they might be pregnant. There is a potential for serious side effects to an unborn child. Talk to your health care professional or pharmacist for more information. Do not breast-feed an infant while taking this medicine or for 2 months after the last dose. What side effects may I notice from receiving this medicine? Side effects that you should report to your doctor or health care professional as soon as possible: -allergic reactions like skin rash, itching or hives, swelling of the face, lips, or tongue -breathing problems -changes in vision -eye pain -fast, irregular heartbeat -fever, chills -mouth sores -red spots on the skin -redness, blistering, peeling or loosening of the skin, including inside the mouth -signs and symptoms of kidney injury like trouble passing urine or change in the amount of urine -signs and symptoms of low blood pressure like dizziness; feeling faint or lightheaded, falls; unusually weak or tired -signs of low calcium like fast heartbeat, muscle cramps or muscle pain; pain, tingling, numbness in the hands or feet; seizures -signs and symptoms of low magnesium like muscle cramps, pain, or weakness; tremors; seizures; or fast, irregular heartbeat -signs and symptoms of low potassium like muscle cramps or muscle pain; chest pain; dizziness; feeling faint or lightheaded, falls; palpitations; breathing problems; or fast, irregular heartbeat -swelling of the ankles, feet, hands Side effects that usually do not require medical attention (report to your doctor or health care professional if they continue or are bothersome): -changes in skin like acne,  cracks, skin dryness -diarrhea -eyelash growth -headache -mouth sores -nail changes -nausea, vomiting This list may not describe all possible side effects. Call your doctor for medical advice about side effects. You may report side effects to FDA at 1-800-FDA-1088. Where should I keep my medicine? This drug is given in a hospital or clinic and will not be stored at home. NOTE: This sheet is a summary. It may not cover all possible information. If you have questions about this medicine, talk to your doctor, pharmacist, or health care provider.  2019 Elsevier/Gold Standard (2016-03-13 16:45:04)  Coronavirus (COVID-19) Are you at risk?  Are you at risk for the Coronavirus (COVID-19)?  To be considered HIGH RISK for Coronavirus (COVID-19), you have to meet the following criteria:  . Traveled to [place], [place], [place], [place] or [place]; or in the [place] to [place], [place], [place], or [place]; and have fever, cough, and shortness of breath within the last 2 weeks of travel OR . Been in close contact with a person diagnosed with COVID-19 within the last 2 weeks and have fever, cough, and shortness of breath . IF YOU DO NOT MEET THESE CRITERIA, YOU ARE CONSIDERED LOW RISK FOR COVID-19.  What to do if you are HIGH RISK for COVID-19?  Marland Kitchen If you are having a medical emergency, call 911. . Seek medical care right away. Before you go to a doctor's office, urgent  care or emergency department, call ahead and tell them about your recent travel, contact with someone diagnosed with COVID-19, and your symptoms. You should receive instructions from your physician's office regarding next steps of care.  . When you arrive at healthcare provider, tell the healthcare staff immediately you have returned from visiting [place], [place], [place], [place] or [place]; or traveled in the [place] to [place], [place], [place], or [place]; in the last two weeks or you have been in close  contact with a person diagnosed with COVID-19 in the last 2 weeks.   . Tell the health care staff about your symptoms: fever, cough and shortness of breath. . After you have been seen by a medical provider, you will be either: o Tested for (COVID-19) and discharged home on quarantine except to seek medical care if symptoms worsen, and asked to  - Stay home and avoid contact with others until you get your results (4-5 days)  - Avoid travel on public transportation if possible (such as bus, train, or airplane) or o Sent to the Emergency Department by EMS for evaluation, COVID-19 testing, and possible admission depending on your condition and test results.  What to do if you are LOW RISK for COVID-19?  Reduce your risk of any infection by using the same precautions used for avoiding the common cold or flu:  Marland Kitchen Wash your hands often with soap and warm water for at least 20 seconds.  If soap and water are not readily available, use an alcohol-based hand sanitizer with at least 60% alcohol.  . If coughing or sneezing, cover your mouth and nose by coughing or sneezing into the elbow areas of your shirt or coat, into a tissue or into your sleeve (not your hands). . Avoid shaking hands with others and consider head nods or verbal greetings only. . Avoid touching your eyes, nose, or mouth with unwashed hands.  . Avoid close contact with people who are sick. . Avoid places or events with large numbers of people in one location, like concerts or sporting events. . Carefully consider travel plans you have or are making. . If you are planning any travel outside or inside the [place], visit the CDC's Travelers' Health webpage for the latest health notices. . If you have some symptoms but not all symptoms, continue to monitor at home and seek medical attention if your symptoms worsen. . If you are having a medical emergency, call 911.   [place] / e-Visit:  [email or web address]         [facility]: [facility]: [phone]                   [facility]: [phone]

## 2019-02-08 NOTE — Progress Notes (Signed)
[place]   [phone] Fax:([phone]) [phone]   Clinic Follow up Note   Patient Care Team: [name], MD as PCP - General (Family Medicine) Ronald Lobo, MD as Consulting Physician (Gastroenterology) Tania Ade, RN as Registered Nurse Kyung Rudd, MD as Consulting Physician (Radiation Oncology) [name], MD as Consulting Physician (Hematology) 02/08/2019  CHIEF COMPLAINT: f/u metastatic rectal cancer   SUMMARY OF ONCOLOGIC HISTORY: Oncology History    Rectal cancer [facility])   Staging form: Colon and Rectum, AJCC 7th Edition     Clinical stage from 08/08/2015: Stage IIIB (T3, N1, M0) - Unsigned       Rectal cancer ([place])   07/16/2015 Pathology Results    Biopsy positive for invasive adenocarcinoma with signet ring cell features    07/23/2015 Initial Diagnosis    Rectal cancer ([place])    08/12/2015 Concurrent Chemotherapy    Xeloda '1500mg'$  q12h on the day of radiation     08/12/2015 - 09/20/2015 Radiation Therapy    radiaiton to rectal cancer     02/18/2016 Procedure    FLEX SIGMOIDOSCOPY: Endoscopic disappearance of previous rectal mass; atrophic mucosa in distal rectum  per Dr. Cristina Gong    02/18/2016 Pathology Results    02/18/2016 Surgical Pathology Diagnosis Surgical [P], rectum - MILD GLANDULAR ARCHITECTURAL CHANGES AND REPARATIVE FEATURES. - NO ADENOMATOUS CHANGE OR MALIGNANCY IDENTIFIED.    02/20/2016 Pathology Results    No adenomatous change or malignancy    05/11/2017 Imaging    CT CAP IMPRESSION: 1. Newly enlarged 1.3 cm in short axis aortocaval node, while not entirely specific, is concerning for recurrent malignancy. No other indicators of recurrence identified. Biopsy or nuclear medicine PET-CT could be utilized to provide greater specificity if clinically warranted. 2. Continued abnormal wall thickening in the inferior rectum. Surrounding perirectal and presacral stranding likely from prior radiation therapy. 3. Prostatomegaly,  prostate volume 93 cubic cm. 4. Other imaging findings of potential clinical significance: Aortic Atherosclerosis (ICD10-I70.0). Coronary atherosclerosis with mild cardiomegaly. Markedly severe chronic arthropathy in the left hip. Suspected cyst in segment 6 of the liver, unchanged. Prominent stool throughout the colon favors constipation. Direct right inguinal hernia contains adipose tissue. Possible retraction of the left testicle. 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    07/28/2017 Imaging    CT AP W Contrast 07/28/17  IMPRESSION: 1. Residual rectal wall thickening, as before. Mildly enlarged aortocaval lymph node is stable. No additional evidence of metastatic disease. 2. Aortic atherosclerosis (ICD10-170.0). Three-vessel coronary artery calcification. 3. Enlarged prostate. 4. Advanced left hip osteoarthritis.    01/12/2018 Procedure     01/12/2018 Sigmoidoscopy Erythematous and febrile mucosa in the mid rectum consistent with prior chemoradiation. No evidence or local recurrence.     11/03/2018 Imaging    CT AP W Contrast  IMPRESSION: 1. Findings consistent with metastatic pelvic and retroperitoneal lymphadenopathy, which has developed/increased since the prior CT. This is presumably metastatic disease from rectal carcinoma. Retroperitoneal adenopathy partly compresses the inferior vena cava and may account for lower extremity edema. 2. There are also irregular nodules of the lung bases concerning for metastatic disease, new since the prior CT. 3. No other evidence of metastatic disease. 4. No acute findings. 5. Prostatic hypertrophy. 6. Aortic atherosclerosis.    11/15/2018 Imaging    PET  IMPRESSION: 1. Relatively mild metabolic activity associated with the iliac adenopathy and retroperitoneal periaortic adenopathy is consistent with mucinous (signet cell) rectal adenocarcinoma which has relatively low metabolic activity. 2. Irregular  smudgy  nodules within LEFT and RIGHT lung with mild metabolic activity are concerning for rectal carcinoma metastasis to the lungs. 3. No liver metastasis.     01/25/2019 -  Chemotherapy     Xeloda '1500mg'$  in the AM and '1000mg'$  in PM 1 week on/1week off starting on 12/26/18. Vectibix q2weeks added 01/25/19     CURRENT THERAPY: Xeloda '1500mg'$  am, '1000mg'$  pm, one week on , one week off starting 12/26/18. Vectibix added 01/25/19  INTERVAL HISTORY: Mr. Upchurch returns for follow up and treatment as scheduled. He began current Xeloda cycle 6/1. He takes 1500 mg AM and 1000 mg PM. He feels OK. Using a walker today and a wheelchair at his residence to get fresh air on the grounds. He is fatigued, improves slightly off Xeloda. Appetite is good. He is trying to lose some weight by eating less. Denies n/v/c/d, or mucositis. Normally his bowels move for 1-2 days every 6 days. He notes rare blood per rectum which he reports is from a bleeding scar he got after radiation. Resolves after "one wipe." He has urine incontinence, on a diuretic. No dysuria. He denies skin rash. Legs are both swollen, right leg is red, tender, and weeping. Denies fever, chills. Denies cough, chest pain, dyspnea. Denies hand/foot redness of pain. Denies mucositis.     MEDICAL HISTORY:  Past Medical History:  Diagnosis Date  . Anemia due to antineoplastic chemotherapy   . Atherosclerosis of coronary artery cardiologist-  dr Aundra Dubin   aorta and coronary arteries moderate per ct 02-07-2016  . Basal cell carcinoma    40+  . Bladder calculi   . BPH (benign prostatic hyperplasia)   . Calculi, urethra   . Chronic low back pain   . Constipation   . ED (erectile dysfunction)   . Edema of lower extremity   . Family history of melanoma   . Foley catheter in place   . History of adenomatous polyp of colon    2007  . History of basal cell carcinoma excision    multiple  . History of cellulitis    left lower leg-- resolved  . History of  melanoma excision    temple right side/ multiple  . History of obstructive sleep apnea    per pt cured by hypnosis  . History of pericarditis    acute episode 09/ 2014  per cardiologist note , dr Aundra Dubin , related possibly to xarelto use  . History of ulcerative colitis    remote  . HTN (hypertension)   . Hypercholesteremia   . Hypothyroidism   . Incomplete right bundle branch block   . LAFB (left anterior fascicular block)   . Melanoma ([place])   . OA (osteoarthritis)   . PAF (paroxysmal atrial fibrillation) [facility]) cardiologist-  dr dalton Aundra Dubin   dx 09/ 2014  in setting of acute pericarditis   . Rectal cancer [facility]) dx 07-16-2015  oncologist-  dr [name]   Stage IIIB (cT3, N1, M0)-- concurrent  chemotherapy started 08-12-2015/  radiation complete (08-12-2015 to 09-20-2015)  . S/P radiation therapy 08/12/15-09/20/15   rectal ca50.4Gy total dose  . Spinal stenosis     SURGICAL HISTORY: Past Surgical History:  Procedure Laterality Date  . CATARACT EXTRACTION W/ INTRAOCULAR LENS IMPLANT Bilateral 10/ 2016  . CYSTOSCOPY WITH LITHOLAPAXY N/A 02/12/2016   Procedure: CYSTOSCOPY WITH LITHOLAPAXY;  Surgeon: Alexis Frock, MD;  Location: [facility];  Service: Urology;  Laterality: N/A;  . EUS N/A 08/08/2015   Procedure:  LOWER ENDOSCOPIC ULTRASOUND (EUS);  Surgeon: Arta Silence, MD;  Location: [facility] ENDOSCOPY;  Service: Endoscopy;  Laterality: N/A;  . FLEXIBLE SIGMOIDOSCOPY N/A 08/08/2015   Procedure: FLEXIBLE SIGMOIDOSCOPY;  Surgeon: Arta Silence, MD;  Location: [facility] ENDOSCOPY;  Service: Endoscopy;  Laterality: N/A;  poor prep  . HOLMIUM LASER APPLICATION N/A 05/10/7901   Procedure: HOLMIUM LASER APPLICATION;  Surgeon: Alexis Frock, MD;  Location: [facility];  Service: Urology;  Laterality: N/A;  . INGUINAL HERNIA REPAIR Left 1965  . LEFT KNEE SURGERY  1940  . MOHS SURGERY  x9  last one 2014   . ORIF LEFT ANKLE FX  1935  . ROTATOR CUFF REPAIR Bilateral right 2013/   left 2010  . TRANSTHORACIC ECHOCARDIOGRAM  06-06-2013   ef 55-60%/  mild AR/  mild dilated aortic root/  mild LAE and RAE/  mild to moderate TR/  small pericardial effusion identified    I have reviewed the social history and family history with the patient and they are unchanged from previous note.  ALLERGIES:  is allergic to cinnamon; ibuprofen; percocet [oxycodone-acetaminophen]; protonix [pantoprazole sodium]; and xarelto [rivaroxaban].  MEDICATIONS:  Current Outpatient Medications  Medication Sig Dispense Refill  . aspirin EC 325 MG tablet Take 325 mg by mouth daily.     . capecitabine (XELODA) 500 MG tablet Take 3 tablets ('1500mg'$ ) by mouth in AM & 2 tabs ('1000mg'$ ) in PM, immediately after food, take on days 1-7 & 15-21 of each 28d cycle 70 tablet 1  . finasteride (PROSCAR) 5 MG tablet Take 5 mg by mouth every evening. Dose is taken at bedtime    . levothyroxine (SYNTHROID) 112 MCG tablet Take 1 tablet (112 mcg total) by mouth daily before breakfast. 90 tablet 3  . metoprolol succinate (TOPROL-XL) 25 MG 24 hr tablet TAKE 1/2 TABLET BY MOUTH DAILY. 45 tablet 2  . Multiple Vitamin (MULTIVITAMIN WITH MINERALS) TABS tablet Take 1 tablet by mouth daily.    . potassium chloride (K-DUR) 10 MEQ tablet TAKE 1 TABLET BY MOUTH EVERY DAY 90 tablet 3  . torsemide (DEMADEX) 20 MG tablet Take 20 mg by mouth daily.    . cephALEXin (KEFLEX) 500 MG capsule Take 1 capsule (500 mg total) by mouth 2 (two) times daily. 10 capsule 0  . ondansetron (ZOFRAN) 4 MG tablet Take 1-2 tablets (4-8 mg total) by mouth every 8 (eight) hours as needed for nausea or vomiting. (Patient not taking: Reported on 01/25/2019) 30 tablet 0   No current facility-administered medications for this visit.    Facility-Administered Medications Ordered in Other Visits  Medication Dose Route Frequency Provider Last Rate Last Dose  . heparin lock flush 100 unit/mL  500 Units Intracatheter Once PRN [name], MD      . panitumumab  (VECTIBIX) 500 mg in sodium chloride 0.9 % 100 mL chemo infusion  5.6 mg/kg (Order-Specific) Intravenous Once [name], MD 125 mL/hr at 02/08/19 1355 500 mg at 02/08/19 1355  . sodium chloride flush (NS) 0.9 % injection 10 mL  10 mL Intracatheter PRN [name], MD        PHYSICAL EXAMINATION: ECOG PERFORMANCE STATUS: 1-2  Vitals:   02/08/19 1120  BP: (!) 149/69  Pulse: 93  Resp: 17  Temp: 98.2 F (36.8 C)  SpO2: 96%   Filed Weights   02/08/19 1120  Weight: 200 lb 6.4 oz (90.9 kg)    GENERAL:alert, no distress and comfortable SKIN: mild acne type rash to hairline and neck.  EYES:  sclera clear LUNGS: respirations even and unlabored  HEART: marked lower extremity edema with vascular changes to skin. Posterior right lower leg with erythema, tenderness, and weeping  Musculoskeletal: no cyanosis of digits NEURO: alert & oriented x 3 with fluent speech, normal gait with walker  Limited exam for covid19 outbreak  LABORATORY DATA:  I have reviewed the data as listed CBC Latest Ref Rng & Units 02/08/2019 01/25/2019 11/10/2018  WBC 4.0 - 10.5 K/uL 6.0 4.2 5.8  Hemoglobin 13.0 - 17.0 g/dL 11.5(L) 11.9(L) 11.9(L)  Hematocrit 39.0 - 52.0 % 35.2(L) 36.9(L) 36.8(L)  Platelets 150 - 400 K/uL 218 207 236     CMP Latest Ref Rng & Units 02/08/2019 01/25/2019 11/10/2018  Glucose 70 - 99 mg/dL 94 103(H) 114(H)  BUN 8 - 23 mg/dL 30(H) 21 24(H)  Creatinine 0.61 - 1.24 mg/dL 1.55(H) 1.52(H) 1.19  Sodium 135 - 145 mmol/L 137 138 136  Potassium 3.5 - 5.1 mmol/L 4.1 4.3 4.5  Chloride 98 - 111 mmol/L 100 98 99  CO2 22 - 32 mmol/L '25 28 28  '$ Calcium 8.9 - 10.3 mg/dL 9.0 9.3 9.2  Total Protein 6.5 - 8.1 g/dL 6.7 7.2 6.8  Total Bilirubin 0.3 - 1.2 mg/dL 0.6 0.7 0.6  Alkaline Phos 38 - 126 U/L 114 116 109  AST 15 - 41 U/L '22 22 23  '$ ALT 0 - 44 U/L '12 16 16      '$ RADIOGRAPHIC STUDIES: I have personally reviewed the radiological images as listed and agreed with the findings in the report. No results  found.   ASSESSMENT & PLAN: NAHOME BUBLITZ is a 85 y.o. male with   1. Low rectal adenocarcinoma, cT3N1M0, stage IIIB,withnodes metastatic recurrence in 11/2018, KRAS/NRAS/BRAF wild type, MSS  -He was diagnosed on 07/23/2015. He is s/p concurrent chemotherapy and radiation.He opted not to have surgery due to his advanced age. His back pain resolved after treatment.  -His genetic testing was negative -he reported worsening lower extremity edema and had a CT scans done on 2/27/20which showedsignificant abdominal and pelvic adenopathy, highly suspicious for metastatic disease. -His PET scan fromMarch 10, 2020, which showed mild hypermetabolic lymphadenopathy in pelvic and retroperitoneum, consistent withmetastatic disease from hismucinous rectal adenocarcinoma.Due to his advanced age, Dr. Burr Medico did not recommend biopsy  -Pt declined palliative radiation due to his severe spinal stenosis  -He started Xeloda '1500mg'$  in the AM and '1000mg'$  in PM 1 week on/1 week off on 12/26/18. added panitumumab on 01/25/19 q2weeks.  -He has tolerated Xeloda well with mild increase in fatigue.  -Mr. Harty appears well. He completed cycle 1 panitumumab. He tolerated well with very mild skin rash to head and neck. He continues to tolerate Xeloda.  -he has lost some weight intentionally; I reviewed we do not recommend intentional weight loss while on cancer treatment. Rather we promote a healthy, well rounded diet with adequate hydration and to remain active. He understands.  -labs reviewed. CBC stable. Due to worsening renal function, I recommend to decrease Xeloda to 1000 mg BID to complete this week's cycle. Then he will have 1 week off. Resume normal dose with next cycle 1500 mg AM and 1000 mg PM. Proceed with panitumumab today. I reviewed the plan with Dr. Burr Medico.  -f/u in 2 weeks with cycle 3 panitumumab   2. Worsening Lower extremity edema  -previously on Torsemide 40 mg daily, managed by [name],  NP  -Possible related to RP adenopathy; he declined more radiation  -edema worsened after reducing  torsemide to 20 mg daily -he has increased Cr lately, and has held torsemide intermittently   3. Right lower leg cellulitis  -RLL shows erythema, edema, tenderness and skin weeping  -I prescribed a course of keflex (dose reduced for renal function) -will monitor closely   4. HTN, AF, arthritis -F/u with PCP -149/69 today -a nurse at [facility] checks his vitals weekly on mondays   5.History of skin cancer including melanoma -F/u with Dermatologist  6. AKI -Cr increased from 1.19 2 months ago to 1.52 today, possible related to diuretics or Xeloda  -Cr 1.55 today. He holds torsemide intermittently, however he has marked leg edema today -will reduce xeloda to 1000 mg BID for duration of this cycle. He will resume 1500 mg AM/1000 mg PM with next cycle and will recheck labs in 2 weeks   7.Goal of care discussion, DNR/DNI   -treatment goal is palliative   PLAN: -Labs reviewed  -Proceed with panitumumab today -Continue current Xeloda cycle, decrease to 1000 mg BID for remainder of this week  -resume normal dose 1500 mg AM and 1000 mg PM 1 week on/1 week off with next cycle (in 2 weeks) -Rx: Keflex for cellulitis of right leg   -F/u in 2 weeks   All questions were answered. The patient knows to call the clinic with any problems, questions or concerns. No barriers to learning was detected. I spent 20 minutes counseling the patient face to face. The total time spent in the appointment was 25 minutes and more than 50% was on counseling and review of test results     Alla Feeling, NP 02/08/19

## 2019-02-09 ENCOUNTER — Telehealth: Payer: Self-pay | Admitting: Nurse Practitioner

## 2019-02-09 NOTE — Telephone Encounter (Signed)
No los per 6/3. °

## 2019-02-16 MED FILL — CAPECITABINE 500 MG TABS: 500 | 28 days supply | Qty: 70 | Fill #0

## 2019-02-20 ENCOUNTER — Telehealth: Payer: Self-pay

## 2019-02-20 NOTE — Telephone Encounter (Signed)
Spoke with patient regarding Xeloda, per Dr. Burr Medico due to his lower extremity weakness and cellulitis she recommends not taking it until he comes in for f/u on Thursday.  The patient verbalized an understanding.

## 2019-02-20 NOTE — Telephone Encounter (Signed)
Patient called needing clarification on Xeloda dose he is supposed to be taking.  He is under the impression he is to take 1000 mg BID.  Also he states he is having some weeping from his lower extremities as well as extreme lower extremity weakness so much so he is afraid he will fall.    I told him I will check with Dr. Burr Medico or Cira Rue NP to verify his dose of Xeloda and call him back.

## 2019-02-21 NOTE — Progress Notes (Signed)
[place]   [phone] Fax:(336) [phone]   Clinic Follow up Note   Patient Care Team: [name], MD as PCP - General (Family Medicine) Ronald Lobo, MD as Consulting Physician (Gastroenterology) Tania Ade, RN as Registered Nurse Kyung Rudd, MD as Consulting Physician (Radiation Oncology) [name], MD as Consulting Physician (Hematology) 02/22/2019  CHIEF COMPLAINT: f/u metastatic rectal cancer   SUMMARY OF ONCOLOGIC HISTORY: Oncology History Overview Note   Rectal cancer [facility])   Staging form: Colon and Rectum, AJCC 7th Edition     Clinical stage from 08/08/2015: Stage IIIB (T3, N1, M0) - Unsigned     Rectal cancer ([place])  07/16/2015 Pathology Results   Biopsy positive for invasive adenocarcinoma with signet ring cell features   07/23/2015 Initial Diagnosis   Rectal cancer ([place])   08/12/2015 Concurrent Chemotherapy   Xeloda 1547m q12h on the day of radiation    08/12/2015 - 09/20/2015 Radiation Therapy   radiaiton to rectal cancer    02/18/2016 Procedure   FLEX SIGMOIDOSCOPY: Endoscopic disappearance of previous rectal mass; atrophic mucosa in distal rectum  per Dr. BCristina Gong  02/18/2016 Pathology Results   02/18/2016 Surgical Pathology Diagnosis Surgical [P], rectum - MILD GLANDULAR ARCHITECTURAL CHANGES AND REPARATIVE FEATURES. - NO ADENOMATOUS CHANGE OR MALIGNANCY IDENTIFIED.   02/20/2016 Pathology Results   No adenomatous change or malignancy   05/11/2017 Imaging   CT CAP IMPRESSION: 1. Newly enlarged 1.3 cm in short axis aortocaval node, while not entirely specific, is concerning for recurrent malignancy. No other indicators of recurrence identified. Biopsy or nuclear medicine PET-CT could be utilized to provide greater specificity if clinically warranted. 2. Continued abnormal wall thickening in the inferior rectum. Surrounding perirectal and presacral stranding likely from prior radiation therapy. 3. Prostatomegaly,  prostate volume 93 cubic cm. 4. Other imaging findings of potential clinical significance: Aortic Atherosclerosis (ICD10-I70.0). Coronary atherosclerosis with mild cardiomegaly. Markedly severe chronic arthropathy in the left hip. Suspected cyst in segment 6 of the liver, unchanged. Prominent stool throughout the colon favors constipation. Direct right inguinal hernia contains adipose tissue. Possible retraction of the left testicle. 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   07/28/2017 Imaging   CT AP W Contrast 07/28/17  IMPRESSION: 1. Residual rectal wall thickening, as before. Mildly enlarged aortocaval lymph node is stable. No additional evidence of metastatic disease. 2. Aortic atherosclerosis (ICD10-170.0). Three-vessel coronary artery calcification. 3. Enlarged prostate. 4. Advanced left hip osteoarthritis.   01/12/2018 Procedure    01/12/2018 Sigmoidoscopy Erythematous and febrile mucosa in the mid rectum consistent with prior chemoradiation. No evidence or local recurrence.    11/03/2018 Imaging   CT AP W Contrast  IMPRESSION: 1. Findings consistent with metastatic pelvic and retroperitoneal lymphadenopathy, which has developed/increased since the prior CT. This is presumably metastatic disease from rectal carcinoma. Retroperitoneal adenopathy partly compresses the inferior vena cava and may account for lower extremity edema. 2. There are also irregular nodules of the lung bases concerning for metastatic disease, new since the prior CT. 3. No other evidence of metastatic disease. 4. No acute findings. 5. Prostatic hypertrophy. 6. Aortic atherosclerosis.   11/15/2018 Imaging   PET  IMPRESSION: 1. Relatively mild metabolic activity associated with the iliac adenopathy and retroperitoneal periaortic adenopathy is consistent with mucinous (signet cell) rectal adenocarcinoma which has relatively low metabolic activity. 2. Irregular smudgy nodules within  LEFT and RIGHT lung with mild metabolic activity are concerning for rectal carcinoma metastasis to the lungs. 3. No liver metastasis.  01/25/2019 -  Chemotherapy    Xeloda 1563m in the AM and 10018min PM 1 week on/1week off starting on 12/26/18. Vectibix q2weeks added 01/25/19     CURRENT THERAPY: Xeloda 150043mm, 1000m71m, one week on , one week offstarting 12/26/18. Vectibix added5/20/20. Reduced to 1000 mg BID 1 week on and 1 week off 02/08/19 due to increased renal function  INTERVAL HISTORY: Mr. ForeVarricchiourns for f/u as scheduled. He was last seen 02/08/19. He received vectibix. Xeloda was dose reduced to 1000 mg BID to complete the cycle due to right lower leg cellulitis and increased creatinine. He was treated with keflex. He called 02/20/19 to clarify the dose of this week's cycle and noted his leg continues to weep and he feels very weak. It was recommended he hold Xeloda until f/u today however he did take 2 on Monday AM 6/15. Monday he continued to feel weak and fatigued. He did not leave his room but was not confined to bed. He felt better past 2 days. Has one crack on each of his thumbs and mild sores on his lips. Does not limit po intake. Denies n/v. Has "heavy" BM every 3-4 days at his baseline. He is not urinating much, drinks little amounts throughout the day. Denies dysuria or hematuria. He continues to have redness and weeping in both legs now. That happens intermittently over past 2 months. Right leg redness improved on 5-day course of keflex but returned once he stopped. Denies fever or chills. Denies chest pain or dyspnea.    MEDICAL HISTORY:  Past Medical History:  Diagnosis Date  . Anemia due to antineoplastic chemotherapy   . Atherosclerosis of coronary artery cardiologist-  dr mcleAundra Dubinorta and coronary arteries moderate per ct 02-07-2016  . Basal cell carcinoma    40+  . Bladder calculi   . BPH (benign prostatic hyperplasia)   . Calculi, urethra   . Chronic  low back pain   . Constipation   . ED (erectile dysfunction)   . Edema of lower extremity   . Family history of melanoma   . Foley catheter in place   . History of adenomatous polyp of colon    2007  . History of basal cell carcinoma excision    multiple  . History of cellulitis    left lower leg-- resolved  . History of melanoma excision    temple right side/ multiple  . History of obstructive sleep apnea    per pt cured by hypnosis  . History of pericarditis    acute episode 09/ 2014  per cardiologist note , dr mcleAundra Dubinelated possibly to xarelto use  . History of ulcerative colitis    remote  . HTN (hypertension)   . Hypercholesteremia   . Hypothyroidism   . Incomplete right bundle branch block   . LAFB (left anterior fascicular block)   . Melanoma (HCC)[place]. OA (osteoarthritis)   . [facility]mal [facility]  dr dalton mcleAundra Dubinx 09/ 2014  in setting of acute pericarditis   . [facility]  oncologist-  dr yan Truitt Merletage IIIB (cT3, N1, M0)-- concurrent  chemotherapy started 08-12-2015/  radiation complete (08-12-2015 to 09-20-2015)  . S/P radiation therapy 08/12/15-09/20/15   rectal ca50.4Gy total dose  . Spinal stenosis     SURGICAL HISTORY: Past Surgical History:  Procedure Laterality Date  . CATARACT EXTRACTION W/ INTRAOCULAR LENS IMPLANT Bilateral 10/  2016  . CYSTOSCOPY WITH LITHOLAPAXY N/A 02/12/2016   Procedure: CYSTOSCOPY WITH LITHOLAPAXY;  Surgeon: Alexis Frock, MD;  Location[facility];  Service: Urology;  Laterality: N/A;  . EUS N/A 08/08/2015   Procedure: LOWER ENDOSCOPIC ULTRASOUND (EUS);  Surgeon: Arta Silence, MD;  Location: [facility] ENDOSCOPY;  Service: Endoscopy;  Laterality: N/A;  . FLEXIBLE SIGMOIDOSCOPY N/A 08/08/2015   Procedure: FLEXIBLE SIGMOIDOSCOPY;  Surgeon: Arta Silence, MD;  Location: [facility] ENDOSCOPY;  Service: Endoscopy;  Laterality: N/A;  poor prep  . HOLMIUM LASER APPLICATION  N/A 0/09/6008   Procedure: HOLMIUM LASER APPLICATION;  Surgeon: Alexis Frock, MD;  Location: [facility];  Service: Urology;  Laterality: N/A;  . INGUINAL HERNIA REPAIR Left 1965  . LEFT KNEE SURGERY  1940  . MOHS SURGERY  x9  last one 2014   . ORIF LEFT ANKLE FX  1935  . ROTATOR CUFF REPAIR Bilateral right 2013/  left 2010  . TRANSTHORACIC ECHOCARDIOGRAM  06-06-2013   ef 55-60%/  mild AR/  mild dilated aortic root/  mild LAE and RAE/  mild to moderate TR/  small pericardial effusion identified    I have reviewed the social history and family history with the patient and they are unchanged from previous note.  ALLERGIES:  is allergic to cinnamon; ibuprofen; percocet [oxycodone-acetaminophen]; protonix [pantoprazole sodium]; and xarelto [rivaroxaban].  MEDICATIONS:  Current Outpatient Medications  Medication Sig Dispense Refill  . aspirin EC 325 MG tablet Take 325 mg by mouth daily.     . capecitabine (XELODA) 500 MG tablet TAKE 3 TABLETS (1500MG) BY MOUTH IN AM & 2 TABS (1000MG) IN PM, IMMEDIATELY AFTER FOOD, TAKE ON DAYS 1-7 & 15-21 OF EACH 28D CYCLE 70 tablet 1  . cephALEXin (KEFLEX) 500 MG capsule Take 1 capsule (500 mg total) by mouth 2 (two) times daily. 14 capsule 0  . finasteride (PROSCAR) 5 MG tablet Take 5 mg by mouth every evening. Dose is taken at bedtime    . levothyroxine (SYNTHROID) 112 MCG tablet Take 1 tablet (112 mcg total) by mouth daily before breakfast. 90 tablet 3  . magic mouthwash w/lidocaine SOLN Take 5 mLs by mouth 3 (three) times daily. Lidocaine 19m, Diphenhyramine 864m Maalox 8085mSwish and Spit 5mL38mtimes daily as needed    . metoprolol succinate (TOPROL-XL) 25 MG 24 hr tablet TAKE 1/2 TABLET BY MOUTH DAILY. 45 tablet 2  . Multiple Vitamin (MULTIVITAMIN WITH MINERALS) TABS tablet Take 1 tablet by mouth daily.    . potassium chloride (K-DUR) 10 MEQ tablet TAKE 1 TABLET BY MOUTH EVERY DAY 90 tablet 3  . torsemide (DEMADEX) 20 MG tablet Take  20 mg by mouth daily.    . ondansetron (ZOFRAN) 4 MG tablet Take 1-2 tablets (4-8 mg total) by mouth every 8 (eight) hours as needed for nausea or vomiting. (Patient not taking: Reported on 02/22/2019) 30 tablet 0   No current facility-administered medications for this visit.     PHYSICAL EXAMINATION: ECOG PERFORMANCE STATUS: 2 - Symptomatic, <50% confined to bed  Vitals:   02/22/19 1316  BP: 140/73  Pulse: 73  Resp: 17  Temp: 98.3 F (36.8 C)  SpO2: 100%   Filed Weights   02/22/19 1316  Weight: 199 lb 8 oz (90.5 kg)    GENERAL:alert, no distress and comfortable SKIN: no rash. Crack to b/l thumbs  EYES:  sclera clear OROPHARYNX: small ulceration to left lower oral mucosa LUNGS: respirations even and unlabored  HEART: marked bilateral  lower extremity erythema and edema with weeping  Musculoskeletal:no cyanosis of digits NEURO: alert & oriented x 3 with fluent speech, in wheelchair  Limited exam for covid19 outbreak   LABORATORY DATA:  I have reviewed the data as listed CBC Latest Ref Rng & Units 02/22/2019 02/08/2019 01/25/2019  WBC 4.0 - 10.5 K/uL 7.5 6.0 4.2  Hemoglobin 13.0 - 17.0 g/dL 10.3(L) 11.5(L) 11.9(L)  Hematocrit 39.0 - 52.0 % 31.6(L) 35.2(L) 36.9(L)  Platelets 150 - 400 K/uL 259 218 207     CMP Latest Ref Rng & Units 02/22/2019 02/08/2019 01/25/2019  Glucose 70 - 99 mg/dL 133(H) 94 103(H)  BUN 8 - 23 mg/dL 30(H) 30(H) 21  Creatinine 0.61 - 1.24 mg/dL 1.56(H) 1.55(H) 1.52(H)  Sodium 135 - 145 mmol/L 137 137 138  Potassium 3.5 - 5.1 mmol/L 4.3 4.1 4.3  Chloride 98 - 111 mmol/L 101 100 98  CO2 22 - 32 mmol/L _0 Calcium 8.9 - 10.3 mg/dL 9.0 9.0 9.3  Total Protein 6.5 - 8.1 g/dL 6.7 6.7 7.2  Total Bilirubin 0.3 - 1.2 mg/dL 0.5 0.6 0.7  Alkaline Phos 38 - 126 U/L 108 114 116  AST 15 - 41 U/L 32 22 22  ALT 0 - 44 U/L _1 RADIOGRAPHIC STUDIES: I have personally reviewed the radiological images as listed and agreed with the findings in the  report. No results found.   ASSESSMENT & PLAN: MARGARET STAGGS a 85 y.o.malewith   1. Low rectal adenocarcinoma, cT3N1M0, stage IIIB,withnodes metastatic recurrence in 11/2018, KRAS/NRAS/BRAF wild type, MSS -He was diagnosed on 07/23/2015. He is s/p concurrent chemotherapy and radiation.He opted not to have surgery due to his advanced age. His back pain resolved after treatment. -His genetic testing was negative -he reported worsening lower extremity edema and had a CT scans done on 2/27/20which showedsignificant abdominal and pelvic adenopathy, highly suspicious for metastatic disease. -His PET scan fromMarch 10, 2020, which showed mild hypermetabolic lymphadenopathy in pelvic and retroperitoneum, consistent withmetastatic disease from hismucinous rectal adenocarcinoma.Due to his advanced age, Dr. Burr Medico did not recommend biopsy  -Pt declined palliative radiation due to his severe spinal stenosis -He started Xeloda 1559min the AM and 10060min PM1 week on/1 week off on 12/26/18. added panitumumab on 01/25/19 q2weeks. He has tolerated Xeloda well with mild increase in fatigue.  -Dose was reduced to 1000 mg BID 1 week on and 1 week off on 02/08/19 due to decreased renal function and right leg cellulitis -He has what appears to represent persistent lower leg cellulitis now on both legs, right leg improved on keflex but worsened when he completed 5 days therapy. His renal function is stable but elevated. Cr 1.56.  -He is fatigued on Xeloda but has recovered well from that.  -He has cracks on his thumbs, possibly related to Xeloda, I recommend hydrocortisone cream TID PRN -For mild grade I mucositis, not painful and not limiting po intake, I will call in magic mouthwash -I recommend to resume Xeloda 1000 mg BID starting today until 6/22, he will then be off 1 week -he will start next cycle at 1000 mg BID and will f/u in 2 weeks. He will proceed with panitumumab today -Labs  reviewed, adequate for treatment  2. Worsening Lower extremity edema  -previously on Torsemide 40 mg daily, managed by LoTruitt MerleNP  -Possible related to RP adenopathy; he declined more radiation  -edema worsened after reducing torsemide to 20 mg daily -he  has increased Cr lately, and has held torsemide intermittently   3. Right lower leg cellulitis  -RLL shows erythema, edema, tenderness and skin weeping  -I prescribed a 5-day course of keflex (dose reduced for renal function) on 02/08/19, which he completed -symptoms improved on antibiotic then returned once he stopped -I will refill keflex, due to evidence of cellulitis on both lower extremities, for additional 1 week course -will monitor closely  -There is a physician at [facility] who can evaluate him if needed   4. HTN, AF, arthritis -F/u with PCP -a nurse at [facility] checks his vitals weekly on mondays   5.History of skin cancer including melanoma -F/u with Dermatologist  6. AKI -possible related to diuretics or Xeloda  -Cr 1.55 on 02/08/19. He holds torsemide intermittently, however he has marked leg edema lately -Reduced xeloda to 1000 mg BID on 6/3.  -due to persistently elevated Cr 1.56 today, I recommend to continue 1000 mg BID 1 week on and 1 week off -will monitor closely  -I encouraged him to increase po liquids   7.Goal of care discussion, DNR/DNI -treatment goal is palliative    PLAN: -Labs reviewed  -Cr 1.56, increase po hydration -Resume Xeldoa 1000 mg BID today until 6/22, next cycle same dose  -Proceed with panitumumab today  -Hydrocortisone to hands/feet for cracks secondary to Xeloda  -Call in magic mouthwash -Refill Keflex, take for 1 week -Return for lab, f/u with Dr. Burr Medico in 2 weeks with panitumumab   All questions were answered. The patient knows to call the clinic with any problems, questions or concerns. No barriers to learning was detected.     Alla Feeling, NP 02/22/19

## 2019-02-22 ENCOUNTER — Encounter: Payer: Self-pay | Admitting: Nurse Practitioner

## 2019-02-22 ENCOUNTER — Inpatient Hospital Stay (HOSPITAL_BASED_OUTPATIENT_CLINIC_OR_DEPARTMENT_OTHER): Payer: Medicare Other | Admitting: Nurse Practitioner

## 2019-02-22 ENCOUNTER — Inpatient Hospital Stay: Payer: Medicare Other

## 2019-02-22 ENCOUNTER — Other Ambulatory Visit: Payer: Self-pay

## 2019-02-22 VITALS — BP 140/73 | HR 73 | Temp 98.3°F | Resp 17 | Ht 73.0 in | Wt 199.5 lb

## 2019-02-22 DIAGNOSIS — L03115 Cellulitis of right lower limb: Secondary | ICD-10-CM

## 2019-02-22 DIAGNOSIS — C20 Malignant neoplasm of rectum: Secondary | ICD-10-CM

## 2019-02-22 DIAGNOSIS — M1612 Unilateral primary osteoarthritis, left hip: Secondary | ICD-10-CM | POA: Diagnosis not present

## 2019-02-22 DIAGNOSIS — K121 Other forms of stomatitis: Secondary | ICD-10-CM | POA: Diagnosis not present

## 2019-02-22 DIAGNOSIS — R590 Localized enlarged lymph nodes: Secondary | ICD-10-CM

## 2019-02-22 DIAGNOSIS — I10 Essential (primary) hypertension: Secondary | ICD-10-CM

## 2019-02-22 DIAGNOSIS — R600 Localized edema: Secondary | ICD-10-CM

## 2019-02-22 DIAGNOSIS — I700 Atherosclerosis of aorta: Secondary | ICD-10-CM | POA: Diagnosis not present

## 2019-02-22 DIAGNOSIS — I2510 Atherosclerotic heart disease of native coronary artery without angina pectoris: Secondary | ICD-10-CM

## 2019-02-22 DIAGNOSIS — N401 Enlarged prostate with lower urinary tract symptoms: Secondary | ICD-10-CM | POA: Diagnosis not present

## 2019-02-22 DIAGNOSIS — Z5112 Encounter for antineoplastic immunotherapy: Secondary | ICD-10-CM | POA: Diagnosis not present

## 2019-02-22 DIAGNOSIS — Z85820 Personal history of malignant melanoma of skin: Secondary | ICD-10-CM

## 2019-02-22 DIAGNOSIS — Z885 Allergy status to narcotic agent status: Secondary | ICD-10-CM

## 2019-02-22 DIAGNOSIS — I480 Paroxysmal atrial fibrillation: Secondary | ICD-10-CM

## 2019-02-22 DIAGNOSIS — L709 Acne, unspecified: Secondary | ICD-10-CM

## 2019-02-22 DIAGNOSIS — G4733 Obstructive sleep apnea (adult) (pediatric): Secondary | ICD-10-CM

## 2019-02-22 DIAGNOSIS — R918 Other nonspecific abnormal finding of lung field: Secondary | ICD-10-CM

## 2019-02-22 DIAGNOSIS — Z79899 Other long term (current) drug therapy: Secondary | ICD-10-CM

## 2019-02-22 DIAGNOSIS — R531 Weakness: Secondary | ICD-10-CM

## 2019-02-22 DIAGNOSIS — M545 Low back pain: Secondary | ICD-10-CM

## 2019-02-22 DIAGNOSIS — Z9221 Personal history of antineoplastic chemotherapy: Secondary | ICD-10-CM

## 2019-02-22 DIAGNOSIS — R21 Rash and other nonspecific skin eruption: Secondary | ICD-10-CM

## 2019-02-22 DIAGNOSIS — M1990 Unspecified osteoarthritis, unspecified site: Secondary | ICD-10-CM

## 2019-02-22 DIAGNOSIS — L539 Erythematous condition, unspecified: Secondary | ICD-10-CM

## 2019-02-22 DIAGNOSIS — R634 Abnormal weight loss: Secondary | ICD-10-CM

## 2019-02-22 DIAGNOSIS — N179 Acute kidney failure, unspecified: Secondary | ICD-10-CM | POA: Diagnosis not present

## 2019-02-22 DIAGNOSIS — G8929 Other chronic pain: Secondary | ICD-10-CM

## 2019-02-22 DIAGNOSIS — Z886 Allergy status to analgesic agent status: Secondary | ICD-10-CM

## 2019-02-22 DIAGNOSIS — R609 Edema, unspecified: Secondary | ICD-10-CM

## 2019-02-22 DIAGNOSIS — Z923 Personal history of irradiation: Secondary | ICD-10-CM

## 2019-02-22 LAB — CBC WITH DIFFERENTIAL (CANCER CENTER ONLY)
Abs Immature Granulocytes: 0.04 10*3/uL (ref 0.00–0.07)
Basophils Absolute: 0 10*3/uL (ref 0.0–0.1)
Basophils Relative: 0 %
Eosinophils Absolute: 0.2 10*3/uL (ref 0.0–0.5)
Eosinophils Relative: 3 %
HCT: 31.6 % — ABNORMAL LOW (ref 39.0–52.0)
Hemoglobin: 10.3 g/dL — ABNORMAL LOW (ref 13.0–17.0)
Immature Granulocytes: 1 %
Lymphocytes Relative: 8 %
Lymphs Abs: 0.6 10*3/uL — ABNORMAL LOW (ref 0.7–4.0)
MCH: 32.1 pg (ref 26.0–34.0)
MCHC: 32.6 g/dL (ref 30.0–36.0)
MCV: 98.4 fL (ref 80.0–100.0)
Monocytes Absolute: 1.2 10*3/uL — ABNORMAL HIGH (ref 0.1–1.0)
Monocytes Relative: 17 %
Neutro Abs: 5.4 10*3/uL (ref 1.7–7.7)
Neutrophils Relative %: 71 %
Platelet Count: 259 10*3/uL (ref 150–400)
RBC: 3.21 MIL/uL — ABNORMAL LOW (ref 4.22–5.81)
RDW: 19 % — ABNORMAL HIGH (ref 11.5–15.5)
WBC Count: 7.5 10*3/uL (ref 4.0–10.5)
nRBC: 0 % (ref 0.0–0.2)

## 2019-02-22 LAB — CMP (CANCER CENTER ONLY)
ALT: 23 U/L (ref 0–44)
AST: 32 U/L (ref 15–41)
Albumin: 2.8 g/dL — ABNORMAL LOW (ref 3.5–5.0)
Alkaline Phosphatase: 108 U/L (ref 38–126)
Anion gap: 14 (ref 5–15)
BUN: 30 mg/dL — ABNORMAL HIGH (ref 8–23)
CO2: 22 mmol/L (ref 22–32)
Calcium: 9 mg/dL (ref 8.9–10.3)
Chloride: 101 mmol/L (ref 98–111)
Creatinine: 1.56 mg/dL — ABNORMAL HIGH (ref 0.61–1.24)
GFR, Est AFR Am: 43 mL/min — ABNORMAL LOW (ref >60)
GFR, Estimated: 37 mL/min — ABNORMAL LOW (ref >60)
Glucose, Bld: 133 mg/dL — ABNORMAL HIGH (ref 70–99)
Potassium: 4.3 mmol/L (ref 3.5–5.1)
Sodium: 137 mmol/L (ref 135–145)
Total Bilirubin: 0.5 mg/dL (ref 0.3–1.2)
Total Protein: 6.7 g/dL (ref 6.5–8.1)

## 2019-02-22 LAB — MAGNESIUM: Magnesium: 1.9 mg/dL (ref 1.7–2.4)

## 2019-02-22 MED ORDER — SODIUM CHLORIDE 0.9 % IV SOLN
5.6000 mg/kg | Freq: Once | INTRAVENOUS | Status: AC
  Administered 2019-02-22: 15:00:00 500 mg via INTRAVENOUS
  Filled 2019-02-22: qty 20

## 2019-02-22 MED ORDER — CEPHALEXIN 500 MG PO CAPS: 500.0000 mg | ORAL_CAPSULE | Freq: Two times a day (BID) | ORAL | 0 refills | Status: DC

## 2019-02-22 MED ORDER — MAGIC MOUTHWASH W/LIDOCAINE: 5.0000 mL | Freq: Three times a day (TID) | ORAL | 0 refills | Status: AC

## 2019-02-22 MED ORDER — SODIUM CHLORIDE 0.9 % IV SOLN
Freq: Once | INTRAVENOUS | Status: AC
  Administered 2019-02-22: 15:00:00 via INTRAVENOUS
  Filled 2019-02-22: qty 250

## 2019-02-22 NOTE — Progress Notes (Signed)
Bilateral Lower Extremities with weeping Cellulitis. Pants visibly wet. Applied Telfa pads, 4x4 gauze, and Kerlix wrap to both lower legs. Instructed pt. to take off before shower and/or bed. Pt. states he will ask nurses at [facility] to re-bandage as needed.

## 2019-02-23 ENCOUNTER — Telehealth: Payer: Self-pay | Admitting: Nurse Practitioner

## 2019-02-23 NOTE — Telephone Encounter (Signed)
Scheduled appt per 6/17 los. °

## 2019-02-28 ENCOUNTER — Telehealth: Payer: Self-pay | Admitting: Nurse Practitioner

## 2019-02-28 ENCOUNTER — Telehealth: Payer: Self-pay | Admitting: *Deleted

## 2019-02-28 NOTE — Telephone Encounter (Signed)
Pt calling in today due to weeping and swollen legs.  Cancer center stated pt needed to call cardiology.  Pt stated nothing has really changed except both legs are now swollen. Stated Cecille Rubin and pt has discussed swollen legs in past and stated from cancer.  Pt does wrap legs  But stated cannot wrap everyday due to it being so hard to wrap.   Pt is on keflex one tablet (500 mg ) bid.  Pt was off antibiotic last week and was on the week before now restarted on 6/17 per cancer center for cellulitis. Pt is willing to come in if [place] advises.  Will send to Cecille Rubin to advise.

## 2019-02-28 NOTE — Telephone Encounter (Signed)
Spoke with pt and informed pt of Dr. Ernestina Penna instructions below.  Pt stated he would contact cardiologist for follow up. Pt understood NOT to resume Xeloda until pt sees Dr. Burr Medico next week.

## 2019-02-28 NOTE — Telephone Encounter (Signed)
He can stay off Xeloda until next f/u. I treated him a few weeks ago for right leg cellulitis that improved on keflex. Once he completed it, right leg got worse again and had signs of left leg cellulitis when I saw him last week, so I refilled keflex for another week. Does not sound to be improving. There is a physician at [facility] who might be able to evaluate his legs. Will defer further instructions to Dr. Burr Medico.  Thanks, Regan Rakers

## 2019-02-28 NOTE — Telephone Encounter (Signed)
New Message            Patient is calling to ask a question about symptoms that are going on in his body. Patient would like a call [phone]

## 2019-02-28 NOTE — Telephone Encounter (Signed)
Lets increase the Torsemide back to 40 mg a day.  Increase the potassium to 2 pills a day.  I suspect his swelling is from a combination of issues - Xeloda could be making his swelling worse as well the enlargement of the lymph nodes from the cancer.   Can we see him next week for recheck and labs?   Cecille Rubin

## 2019-02-28 NOTE — Telephone Encounter (Signed)
OK to stay off Xeloda until his next appointment with me next week. Please encourage him to f/u with cardiology NP, she has been manage her leg edema.   [name] MD

## 2019-02-28 NOTE — Telephone Encounter (Signed)
Pt called requesting to talk to nurse about medication problems.  Spoke with pt, and was informed that pt experiences bilateral legs weakness - hard to walk, and still has oozing from below knees.  Stated he has occasional spasm in legs - Right more than Left leg about 2 weeks.  Feeling weak - has to use walker more but still able to get around. Pt thinks all symptoms are related to his medications  Wanted to inform Dr. Burr Medico. Pt is currently on his week off Xeloda -  1 week on , 1 week off cycle. Pt's    Phone     [phone].

## 2019-02-28 NOTE — Telephone Encounter (Signed)
S/w pt is aware treatment plan.  Will increase torsemide to two tablets (40 mg ) daily and Kdur two tablets (20 meq) daily for one week.  Pt will come in on June 30 for ov and labs.    Pt might need wheelchair assistance coming up to office.       COVID-19 Pre-Screening Questions:  . In the past 7 to 10 days have you had a cough,  shortness of breath, headache, congestion, fever (100 or greater) body aches, chills, sore throat, or sudden loss of taste or sense of smell?  NO . Have you been around anyone with known Covid 19.  NO . Have you been around anyone who is awaiting Covid 19 test results in the past 7 to 10 days?  NO . Have you been around anyone who has been exposed to Covid 19, or has mentioned symptoms of Covid 19 within the past 7 to 10 days?  NO  Pt is on lockdown at [facility].    If you have any concerns/questions about symptoms patients report during screening (either on the phone or at [facility]). Contact the provider seeing the patient or DOD for further guidance.  If neither are available contact a member of the leadership team.

## 2019-03-03 DIAGNOSIS — H1033 Unspecified acute conjunctivitis, bilateral: Secondary | ICD-10-CM | POA: Diagnosis not present

## 2019-03-06 ENCOUNTER — Telehealth: Payer: Self-pay | Admitting: Nurse Practitioner

## 2019-03-06 DIAGNOSIS — C20 Malignant neoplasm of rectum: Secondary | ICD-10-CM | POA: Diagnosis not present

## 2019-03-06 DIAGNOSIS — C772 Secondary and unspecified malignant neoplasm of intra-abdominal lymph nodes: Secondary | ICD-10-CM | POA: Diagnosis not present

## 2019-03-06 NOTE — Telephone Encounter (Signed)

## 2019-03-06 NOTE — Progress Notes (Signed)
[place]   [phone] Fax:(336) [phone]   Clinic Follow up Note   Patient Care Team: [name], MD as PCP - General (Family Medicine) Ronald Lobo, MD as Consulting Physician (Gastroenterology) Tania Ade, RN as Registered Nurse Kyung Rudd, MD as Consulting Physician (Radiation Oncology) [name], MD as Consulting Physician (Hematology)  I connected with [name] on 03/08/2019 at  1:00 PM EDT by telephone visit and verified that I am speaking with the correct person using two identifiers.  I discussed the limitations, risks, security and privacy concerns of performing an evaluation and management service by telephone and the availability of in person appointments. I also discussed with the patient that there may be a patient responsible charge related to this service. The patient expressed understanding and agreed to proceed.   Patient's location:  His Home  Provider's location:  My Office    CHIEF COMPLAINT: f/u metastatic rectal cancer  SUMMARY OF ONCOLOGIC HISTORY: Oncology History Overview Note   Rectal cancer ([place])   Staging form: Colon and Rectum, AJCC 7th Edition     Clinical stage from 08/08/2015: Stage IIIB (T3, N1, M0) - Unsigned     Rectal cancer ([place])  07/16/2015 Pathology Results   Biopsy positive for invasive adenocarcinoma with signet ring cell features   07/23/2015 Initial Diagnosis   Rectal cancer ([place])   08/12/2015 Concurrent Chemotherapy   Xeloda '1500mg'$  q12h on the day of radiation    08/12/2015 - 09/20/2015 Radiation Therapy   radiaiton to rectal cancer    02/18/2016 Procedure   FLEX SIGMOIDOSCOPY: Endoscopic disappearance of previous rectal mass; atrophic mucosa in distal rectum  per Dr. Cristina Gong   02/18/2016 Pathology Results   02/18/2016 Surgical Pathology Diagnosis Surgical [P], rectum - MILD GLANDULAR ARCHITECTURAL CHANGES AND REPARATIVE FEATURES. - NO ADENOMATOUS CHANGE OR MALIGNANCY  IDENTIFIED.   02/20/2016 Pathology Results   No adenomatous change or malignancy   05/11/2017 Imaging   CT CAP IMPRESSION: 1. Newly enlarged 1.3 cm in short axis aortocaval node, while not entirely specific, is concerning for recurrent malignancy. No other indicators of recurrence identified. Biopsy or nuclear medicine PET-CT could be utilized to provide greater specificity if clinically warranted. 2. Continued abnormal wall thickening in the inferior rectum. Surrounding perirectal and presacral stranding likely from prior radiation therapy. 3. Prostatomegaly, prostate volume 93 cubic cm. 4. Other imaging findings of potential clinical significance: Aortic Atherosclerosis (ICD10-I70.0). Coronary atherosclerosis with mild cardiomegaly. Markedly severe chronic arthropathy in the left hip. Suspected cyst in segment 6 of the liver, unchanged. Prominent stool throughout the colon favors constipation. Direct right inguinal hernia contains adipose tissue. Possible retraction of the left testicle. 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   07/28/2017 Imaging   CT AP W Contrast 07/28/17  IMPRESSION: 1. Residual rectal wall thickening, as before. Mildly enlarged aortocaval lymph node is stable. No additional evidence of metastatic disease. 2. Aortic atherosclerosis (ICD10-170.0). Three-vessel coronary artery calcification. 3. Enlarged prostate. 4. Advanced left hip osteoarthritis.   01/12/2018 Procedure    01/12/2018 Sigmoidoscopy Erythematous and febrile mucosa in the mid rectum consistent with prior chemoradiation. No evidence or local recurrence.    11/03/2018 Imaging   CT AP W Contrast  IMPRESSION: 1. Findings consistent with metastatic pelvic and retroperitoneal lymphadenopathy, which has developed/increased since the prior CT. This is presumably metastatic disease from rectal carcinoma. Retroperitoneal adenopathy partly compresses the inferior vena cava and may account  for lower extremity edema. 2. There are also  irregular nodules of the lung bases concerning for metastatic disease, new since the prior CT. 3. No other evidence of metastatic disease. 4. No acute findings. 5. Prostatic hypertrophy. 6. Aortic atherosclerosis.   11/15/2018 Imaging   PET  IMPRESSION: 1. Relatively mild metabolic activity associated with the iliac adenopathy and retroperitoneal periaortic adenopathy is consistent with mucinous (signet cell) rectal adenocarcinoma which has relatively low metabolic activity. 2. Irregular smudgy nodules within LEFT and RIGHT lung with mild metabolic activity are concerning for rectal carcinoma metastasis to the lungs. 3. No liver metastasis.    01/25/2019 -  Chemotherapy    Xeloda '1500mg'$  in the AM and '1000mg'$  in PM 1 week on/1week off starting on 12/26/18. Vectibix q2weeks added 01/25/19      CURRENT THERAPY:  Xeloda '1500mg'$  am, '1000mg'$  pm, one week on , one week off starting 12/26/18. Vectibix added 01/25/19. Held since 03/08/19.   INTERVAL HISTORY:  Nathaniel [name] is here for a follow up and treatment. He notes he is doing well. He saw his cardiologist PA yesterday. He notes his swelling of b/l went down to almost normal when his PA documented his swelling was significant yesterday. He notes his R>L. He notes he still has cellulitis and will start his antibiotics today. He plans to move to skilled nursing room at [facility] at [employer] tomorrow. He notes it is difficult for him to move around in his current apartment with less help.  He notes he last took Xeloda 3-4 days ago. He notes his fingers have dry split skin with soreness, especially on the thumb. He notes a sore on left side of mouth. He notes he has been able to eat adequately.  He also notes muscle spasm of the legs which are uncontrolled.    REVIEW OF SYSTEMS:   Constitutional: Denies fevers, chills or abnormal weight loss Eyes: Denies blurriness of vision Ears, nose,  mouth, throat, and face: Denies mucositis or sore throat (+) sore on left side of mouth Respiratory: Denies cough, dyspnea or wheezes Cardiovascular: Denies palpitation, chest discomfort (+) Improved b/l lower extremity swelling, R>L Gastrointestinal:  Denies nausea, heartburn or change in bowel habits Skin: Denies abnormal skin rashes (+) dry split skin with soreness, especially on the thumb. Lymphatics: Denies new lymphadenopathy or easy bruising MSK: (+) muscle spasm of the legs which are uncontrolled.  Neurological:Denies numbness, tingling or new weaknesses Behavioral/Psych: Mood is stable, no new changes  All other systems were reviewed with the patient and are negative.  MEDICAL HISTORY:  Past Medical History:  Diagnosis Date   Anemia due to antineoplastic chemotherapy    Atherosclerosis of coronary artery cardiologist-  dr Aundra Dubin   aorta and coronary arteries moderate per ct 02-07-2016   Basal cell carcinoma    40+   Bladder calculi    BPH (benign prostatic hyperplasia)    Calculi, urethra    Chronic low back pain    Constipation    ED (erectile dysfunction)    Edema of lower extremity    Family history of melanoma    Foley catheter in place    History of adenomatous polyp of colon    2007   History of basal cell carcinoma excision    multiple   History of cellulitis    left lower leg-- resolved   History of melanoma excision    temple right side/ multiple   History of obstructive sleep apnea    per pt cured by hypnosis   History of pericarditis  acute episode 09/ 2014  per cardiologist note , dr Aundra Dubin , related possibly to xarelto use   History of ulcerative colitis    remote   HTN (hypertension)    Hypercholesteremia    Hypothyroidism    Incomplete right bundle branch block    LAFB (left anterior fascicular block)    Melanoma ([place])    OA (osteoarthritis)    PAF (paroxysmal atrial fibrillation) [facility] cardiologist-  dr dalton  Aundra Dubin   dx 09/ 2014  in setting of acute pericarditis    Rectal cancer [facility]) dx 07-16-2015  oncologist-  dr Krista Blue Jara Feider   Stage IIIB (cT3, N1, M0)-- concurrent  chemotherapy started 08-12-2015/  radiation complete (08-12-2015 to 09-20-2015)   S/P radiation therapy 08/12/15-09/20/15   rectal ca50.4Gy total dose   Spinal stenosis     SURGICAL HISTORY: Past Surgical History:  Procedure Laterality Date   CATARACT EXTRACTION W/ INTRAOCULAR LENS IMPLANT Bilateral 10/ 2016   CYSTOSCOPY WITH LITHOLAPAXY N/A 02/12/2016   Procedure: CYSTOSCOPY WITH LITHOLAPAXY;  Surgeon: Alexis Frock, MD;  Location: [facility];  Service: Urology;  Laterality: N/A;   EUS N/A 08/08/2015   Procedure: LOWER ENDOSCOPIC ULTRASOUND (EUS);  Surgeon: Arta Silence, MD;  Location[facility]COPY;  Service: Endoscopy;  Laterality: N/A;   FLEXIBLE SIGMOIDOSCOPY N/A 08/08/2015   Procedure: FLEXIBLE SIGMOIDOSCOPY;  Surgeon: Arta Silence, MD;  Location: [facility] ENDOSCOPY;  Service: Endoscopy;  Laterality: N/A;  poor prep   HOLMIUM LASER APPLICATION N/A 12/07/6832   Procedure: HOLMIUM LASER APPLICATION;  Surgeon: Alexis Frock, MD;  Location[facility];  Service: Urology;  Laterality: N/A;   INGUINAL HERNIA REPAIR Left 1965   LEFT KNEE SURGERY  1940   MOHS SURGERY  x9  last one 2014    ORIF LEFT ANKLE FX  1935   ROTATOR CUFF REPAIR Bilateral right 2013/  left 2010   TRANSTHORACIC ECHOCARDIOGRAM  06-06-2013   ef 55-60%/  mild AR/  mild dilated aortic root/  mild LAE and RAE/  mild to moderate TR/  small pericardial effusion identified    I have reviewed the social history and family history with the patient and they are unchanged from previous note.  ALLERGIES:  is allergic to cinnamon; ibuprofen; percocet [oxycodone-acetaminophen]; protonix [pantoprazole sodium]; and xarelto [rivaroxaban].  MEDICATIONS:  Current Outpatient Medications  Medication Sig Dispense Refill   finasteride  (PROSCAR) 5 MG tablet Take 5 mg by mouth every evening. Dose is taken at bedtime     aspirin EC 325 MG tablet Take 325 mg by mouth daily.      capecitabine (XELODA) 500 MG tablet TAKE 3 TABLETS ('1500MG'$ ) BY MOUTH IN AM & 2 TABS ('1000MG'$ ) IN PM, IMMEDIATELY AFTER FOOD, TAKE ON DAYS 1-7 & 15-21 OF EACH 28D CYCLE 70 tablet 1   cephALEXin (KEFLEX) 500 MG capsule Take 1 capsule (500 mg total) by mouth 2 (two) times daily. 14 capsule 0   levothyroxine (SYNTHROID) 112 MCG tablet Take 1 tablet (112 mcg total) by mouth daily before breakfast. 90 tablet 3   magic mouthwash w/lidocaine SOLN Take 5 mLs by mouth 3 (three) times daily. Lidocaine 13m, Diphenhyramine 838m Maalox 8075mSwish and Spit 5mL44mtimes daily as needed 240 mL 0   metoprolol succinate (TOPROL-XL) 25 MG 24 hr tablet TAKE 1/2 TABLET BY MOUTH DAILY. 45 tablet 2   Multiple Vitamin (MULTIVITAMIN WITH MINERALS) TABS tablet Take 1 tablet by mouth daily.     ondansetron (ZOFRAN) 4 MG tablet Take  1-2 tablets (4-8 mg total) by mouth every 8 (eight) hours as needed for nausea or vomiting. 30 tablet 0   potassium chloride (K-DUR) 10 MEQ tablet TAKE 1 TABLET BY MOUTH EVERY DAY 90 tablet 3   torsemide (DEMADEX) 20 MG tablet Take 20 mg by mouth daily.     No current facility-administered medications for this visit.     PHYSICAL EXAMINATION: ECOG PERFORMANCE STATUS: 3 - Symptomatic, >50% confined to bed  No vitals taken today, Exam not performed today  LABORATORY DATA:  I have reviewed the data as listed CBC Latest Ref Rng & Units 02/22/2019 02/08/2019 01/25/2019  WBC 4.0 - 10.5 K/uL 7.5 6.0 4.2  Hemoglobin 13.0 - 17.0 g/dL 10.3(L) 11.5(L) 11.9(L)  Hematocrit 39.0 - 52.0 % 31.6(L) 35.2(L) 36.9(L)  Platelets 150 - 400 K/uL 259 218 207     CMP Latest Ref Rng & Units 02/22/2019 02/08/2019 01/25/2019  Glucose 70 - 99 mg/dL 133(H) 94 103(H)  BUN 8 - 23 mg/dL 30(H) 30(H) 21  Creatinine 0.61 - 1.24 mg/dL 1.56(H) 1.55(H) 1.52(H)  Sodium 135 -  145 mmol/L 137 137 138  Potassium 3.5 - 5.1 mmol/L 4.3 4.1 4.3  Chloride 98 - 111 mmol/L 101 100 98  CO2 22 - 32 mmol/L '22 25 28  '$ Calcium 8.9 - 10.3 mg/dL 9.0 9.0 9.3  Total Protein 6.5 - 8.1 g/dL 6.7 6.7 7.2  Total Bilirubin 0.3 - 1.2 mg/dL 0.5 0.6 0.7  Alkaline Phos 38 - 126 U/L 108 114 116  AST 15 - 41 U/L 32 22 22  ALT 0 - 44 U/L '23 12 16      '$ RADIOGRAPHIC STUDIES: I have personally reviewed the radiological images as listed and agreed with the findings in the report. No results found.   ASSESSMENT & PLAN:  OMAIR DETTMER is a 85 y.o. male with   1. Low rectal adenocarcinoma, cT3N1M0, stage IIIB,withnodes metastatic recurrence in 11/2018, KRAS/NRAS/BRAF wild type, MSS  -He was diagnosed on 07/23/2015. He is s/p concurrent chemotherapy and radiation.He opted not to have surgery due to his advanced age.  -His PET scan fromMarch 10, 2020, which showed mild hypermetabolic lymphadenopathy in pelvic and retroperitoneum, consistent withmetastatic disease from hismucinous rectal adenocarcinoma.Due to his advanced age, I do not think biopsy is needed. -Pt declined palliative radiation due to his severe spinal stenosis  -He started Xeloda and Vectibix in April 2020. He has experienced skin toxicities and fatigue. His leg swelling has been much worse lately and he is moving to [place] tomorrow due to the need of more care  -due to his worsening PS, I will stop his cancer treatment for now. I am not sue if he is responding to treatment. He is not a candidate for other chemo -We discussed palliative care and hospice, he does not think he would need at this point.  He will see MD at SNF --F/u in 6 weeks, will decide if he need a repeat CT scan on next visit    2. Worsening Lower extremity edema, Cellulitis  -Possibly related to RP adenopathy -pt declined palliative RT -He was seen by Cardiologist PA yesterday who notes his b/l LE continued  to worsen. He feels his R ir worse than left recently. Today he notes his swelling has nearly resolved. He also notes recent muscle spasms of his left leg. Will monitor.  -Given his LE cellulitis, his PA started him on oral antibiotics today.   3. HTN, AF, arthritis -  F/u with PCP  4.History of skin cancer including melanoma -F/u with Dermatologist  5. CKD -Cr increased, possibly related to diuretics or Xeloda  -We previously held torsemide, we discussed with cardiology  6.Goal of care discussion, DNR/DNI   -We discussed the incurable nature of his cancer, and the overall poor prognosis, especially if he does not have good response to chemotherapy or progress on chemo -The patient understands the goal of care is palliative. -Pt agreed DNR/DNI  Plan -I will call his daughter to update her  -He will hold Xeloda and vectibix for now  -Lab and f/u in 4-6 weeks    No problem-specific Assessment & Plan notes found for this encounter.   No orders of the defined types were placed in this encounter.  I discussed the assessment and treatment plan with the patient. The patient was provided an opportunity to ask questions and all were answered. The patient agreed with the plan and demonstrated an understanding of the instructions.  The patient was advised to call back or seek an in-person evaluation if the symptoms worsen or if the condition fails to improve as anticipated.  I provided 15 minutes of non face-to-face telephone visit time during this encounter, and > 50% was spent counseling as documented under my assessment & plan.     [name], MD 03/08/2019   I, Joslyn Devon, am acting as scribe for [name], MD.   I have reviewed the above documentation for accuracy and completeness, and I agree with the above.

## 2019-03-06 NOTE — Progress Notes (Signed)
CARDIOLOGY OFFICE NOTE  Date:  03/07/2019    Nathaniel [name] Date of Birth: 30-Jan-1920 Medical Record #[id number]  PCP:  [name], MD  Cardiologist:  Servando Snare   Chief Complaint  Patient presents with  . Edema    Work in visit     History of Present Illness: Nathaniel [name] is a 85 y.o. male who presents today for a follow up visit. Seen for Dr. Aundra Dubin.Primarily follows with me.  He has been an Education administrator forover40 years.  He has hadacute pericarditis and atrial fibrillation.He was admitted in early 9/14 with pleuritic chest pain. He had diffuse slight ST elevation on ECG and was thought to have acute pericarditis, ?viral pericarditis. No pericardial effusion initially. While in the hospital, he went into atrial fibrillation with RVR and was treated with amiodarone - not long term. He converted to NSR andhas remained in NSR.He was started on Xarelto in the hospital. Followup echo later in 9/14 showed the development of a moderate pericardial effusion without tamponade. Xarelto was stopped. Patient had another echo on 06/06/13 that showed a small pericardial effusion (improved).   Hewas thendiagnosed with rectal cancer and underwent chemotherapy and radiation, opted against surgery.   Last seen byDr. Aundra Dubin back in [place] was felt to be doing ok from our standpoint.Ihave followed him since. He has had followup CTs and sigmoidoscopywith good reports.  He has elected to remain on high dose aspirin - patient preference.  I saw him several times earlier this year - having more swelling - noted to be in AF - discussed with Dr. Lovena Le and we both agreed to NOT anticoagulate given his age. We changed him from Lasix to Torsemide. Ended up getting scan on his belly - his cancer has returned - metastatic. Referred back to oncology.   I last saw him thru a telehealth visit back in May - he was on oral chemo and was going to start a  second agent as well. Some fatigue. Swelling persisted. His mental outlook was good. He has since called - oncology wanted him seen for the swelling - diuretics have been increased for a short time.   The patient does not have symptoms concerning for COVID-19 infection (fever, chills, cough, or new shortness of breath).   Comes in today. Here alone. He is not doing well. His pants are soak with fluid. He has an active conjunctivitis - especially in the right eye - says he is still waiting on the RX for this from the pharmacy. He cannot stay awake. He is contemplating moving now to assisted living. He has no chest pain. His swelling he says may be some better but he has had marked erythema - has been treated for cellulitis. Lots of skin cracking/bleeding. He says his breathing is ok. He is still pretty mentally sharp. He is to see Dr. Burr Medico tomorrow -apparently to talk about treatment options and if he will proceed with further treatment.   Past Medical History:  Diagnosis Date  . Anemia due to antineoplastic chemotherapy   . Atherosclerosis of coronary artery cardiologist-  dr Aundra Dubin   aorta and coronary arteries moderate per ct 02-07-2016  . Basal cell carcinoma    40+  . Bladder calculi   . BPH (benign prostatic hyperplasia)   . Calculi, urethra   . Chronic low back pain   . Constipation   . ED (erectile dysfunction)   . Edema of lower extremity   . Family history of  melanoma   . Foley catheter in place   . History of adenomatous polyp of colon    2007  . History of basal cell carcinoma excision    multiple  . History of cellulitis    left lower leg-- resolved  . History of melanoma excision    temple right side/ multiple  . History of obstructive sleep apnea    per pt cured by hypnosis  . History of pericarditis    acute episode 09/ 2014  per cardiologist note , dr Aundra Dubin , related possibly to xarelto use  . History of ulcerative colitis    remote  . HTN (hypertension)   .  Hypercholesteremia   . Hypothyroidism   . Incomplete right bundle branch block   . LAFB (left anterior fascicular block)   . Melanoma ([place])   . OA (osteoarthritis)   . PAF (paroxysmal atrial fibrillation) [facility]) cardiologist-  dr dalton Aundra Dubin   dx 09/ 2014  in setting of acute pericarditis   . Rectal cancer [facility]) dx 07-16-2015  oncologist-  dr [name]   Stage IIIB (cT3, N1, M0)-- concurrent  chemotherapy started 08-12-2015/  radiation complete (08-12-2015 to 09-20-2015)  . S/P radiation therapy 08/12/15-09/20/15   rectal ca50.4Gy total dose  . Spinal stenosis     Past Surgical History:  Procedure Laterality Date  . CATARACT EXTRACTION W/ INTRAOCULAR LENS IMPLANT Bilateral 10/ 2016  . CYSTOSCOPY WITH LITHOLAPAXY N/A 02/12/2016   Procedure: CYSTOSCOPY WITH LITHOLAPAXY;  Surgeon: Alexis Frock, MD;  Location: [facility];  Service: Urology;  Laterality: N/A;  . EUS N/A 08/08/2015   Procedure: LOWER ENDOSCOPIC ULTRASOUND (EUS);  Surgeon: Arta Silence, MD;  Location: [facility] ENDOSCOPY;  Service: Endoscopy;  Laterality: N/A;  . FLEXIBLE SIGMOIDOSCOPY N/A 08/08/2015   Procedure: FLEXIBLE SIGMOIDOSCOPY;  Surgeon: Arta Silence, MD;  Location[facility] END[facility]COPY;  Service: Endoscopy;  Laterality: N/A;  poor prep  . HOLMIUM LASER APPLICATION N/A 11/12/1694   Procedure: HOLMIUM LASER APPLICATION;  Surgeon: Alexis Frock, MD;  Location: [facility];  Service: Urology;  Laterality: N/A;  . INGUINAL HERNIA REPAIR Left 1965  . LEFT KNEE SURGERY  1940  . MOHS SURGERY  x9  last one 2014   . ORIF LEFT ANKLE FX  1935  . ROTATOR CUFF REPAIR Bilateral right 2013/  left 2010  . TRANSTHORACIC ECHOCARDIOGRAM  06-06-2013   ef 55-60%/  mild AR/  mild dilated aortic root/  mild LAE and RAE/  mild to moderate TR/  small pericardial effusion identified     Medications: Current Meds  Medication Sig  . aspirin EC 325 MG tablet Take 325 mg by mouth daily.   . capecitabine (XELODA) 500 MG  tablet TAKE 3 TABLETS (1500MG ) BY MOUTH IN AM & 2 TABS (1000MG ) IN PM, IMMEDIATELY AFTER FOOD, TAKE ON DAYS 1-7 & 15-21 OF EACH 28D CYCLE  . cephALEXin (KEFLEX) 500 MG capsule Take 1 capsule (500 mg total) by mouth 2 (two) times daily.  . finasteride (PROSCAR) 5 MG tablet Take 5 mg by mouth every evening. Dose is taken at bedtime  . levothyroxine (SYNTHROID) 112 MCG tablet Take 1 tablet (112 mcg total) by mouth daily before breakfast.  . magic mouthwash w/lidocaine SOLN Take 5 mLs by mouth 3 (three) times daily. Lidocaine 26mL, Diphenhyramine 76mL, Maalox 27mL  Swish and Spit 19mL 3 times daily as needed  . metoprolol succinate (TOPROL-XL) 25 MG 24 hr tablet TAKE 1/2 TABLET BY MOUTH DAILY.  . Multiple Vitamin (  MULTIVITAMIN WITH MINERALS) TABS tablet Take 1 tablet by mouth daily.  . ondansetron (ZOFRAN) 4 MG tablet Take 1-2 tablets (4-8 mg total) by mouth every 8 (eight) hours as needed for nausea or vomiting.  . potassium chloride (K-DUR) 10 MEQ tablet TAKE 1 TABLET BY MOUTH EVERY DAY  . torsemide (DEMADEX) 20 MG tablet Take 20 mg by mouth daily.     Allergies: Allergies  Allergen Reactions  . Cinnamon Itching  . Ibuprofen Diarrhea  . Percocet [Oxycodone-Acetaminophen] Other (See Comments)    confusion  . Protonix [Pantoprazole Sodium] Diarrhea and Nausea And Vomiting  . Xarelto [Rivaroxaban] Other (See Comments)    Diarrhea, dehydration    Social History: The patient  reports that he has never smoked. He has never used smokeless tobacco. He reports current alcohol use of about 1.0 standard drinks of alcohol per week. He reports that he does not use drugs.   Family History: The patient's family history includes Cancer in his daughter; Cancer (age of onset: 39) in his daughter; Cancer - Cervical in his daughter; Heart disease in his brother, father, and maternal uncle; Melanoma (age of onset: 3) in his brother; Parkinson's disease in his mother; Stroke in his father.   Review of  Systems: Please see the history of present illness.   All other systems are reviewed and negative.   Physical Exam: VS:  BP 122/60   Ht 6' (1.829 m)   Wt 189 lb (85.7 kg)   BMI 25.63 kg/m  .  BMI Body mass index is 25.63 kg/m.  Wt Readings from Last 3 Encounters:  03/07/19 189 lb (85.7 kg)  02/22/19 199 lb 8 oz (90.5 kg)  02/08/19 200 lb 6.4 oz (90.9 kg)    General: He looks quite poor - he is very disheveled today - the bottom half of his pants are wet and dirty - lots of blood stains. His gait is unsteady.  He cannot stay awake during out visit.    HEENT: Normal but with active pus/drainage from the right eye - marked erythema.  Neck: Supple, no JVD, carotid bruits, or masses noted.  Cardiac: Somewhat irregular. Legs are with marked edema - this is chronic - more erythema - unable to slide his pants up - basically sticking to hs skin.   Respiratory:  Lungs are fairly clear.  GI: Soft and nontender.  MS: No deformity or atrophy. Gait unsteady.  Skin: Warm and dry. Color is poor. Neuro:  Strength and sensation are intact and no gross focal deficits noted.  Psych: Alert, appropriate and with normal affect.   LABORATORY DATA:  EKG:  EKG is not ordered today.  Lab Results  Component Value Date   WBC 7.5 02/22/2019   HGB 10.3 (L) 02/22/2019   HCT 31.6 (L) 02/22/2019   PLT 259 02/22/2019   GLUCOSE 133 (H) 02/22/2019   ALT 23 02/22/2019   AST 32 02/22/2019   NA 137 02/22/2019   K 4.3 02/22/2019   CL 101 02/22/2019   CREATININE 1.56 (H) 02/22/2019   BUN 30 (H) 02/22/2019   CO2 22 02/22/2019   TSH 5.640 (H) 09/28/2018     BNP (last 3 results) No results for input(s): BNP in the last 8760 hours.  ProBNP (last 3 results) No results for input(s): PROBNP in the last 8760 hours.   Other Studies Reviewed Today:  EchoStudy Conclusionsfrom 08/2016  - Left ventricle: The cavity size was normal. There was moderate concentric hypertrophy. Systolic function was  normal.  The estimated ejection fraction was in the range of 60% to 65%. Wall motion was normal; there were no regional wall motion abnormalities. Doppler parameters are consistent with abnormal left ventricular relaxation (grade 1 diastolic dysfunction). Doppler parameters are consistent with indeterminate ventricular filling pressure. - Aortic valve: Transvalvular velocity was within the normal range. There was no stenosis. There was mild regurgitation. - Aorta: Ascending aortic diameter: 35 mm (S). - Mitral valve: Transvalvular velocity was within the normal range. There was no evidence for stenosis. There was no regurgitation. - Right ventricle: The cavity size was mildly dilated. Wall thickness was normal. Systolic function was normal. - Right atrium: The atrium was mildly dilated. - Atrial septum: No defect or patent foramen ovale was identified by color flow Doppler. - Tricuspid valve: There was mild regurgitation. - Pulmonary arteries: PA peak pressure: 38 mm Hg (S).     ASSESSMENT & PLAN:    1.Worsening lower extremity edema - known venous insufficiency -he has been found to be in AF.  Also now with metastatic disease from prior rectal adenocarcinoma - this is still felt to be the most influencing factor of his swelling - he has had associated cellulitis as well.   2. Prior history of pericarditis - dates back to 2014 -no recurrence.Not discussed.   3. Persistent AF - remains on high dose aspirin - other anticoagulation not in his interest.   4. Prior rectal bleeding/rectal cancer- looks to me like he is failing - he cannot stay awake during this visit. His clothes are soaked from the edema. Obviously he needs more supportive care - we talked about this today - would support a move to skilled/assisted living and a shift to comfort care - he will need aggressive skin care and help with just basic ADL's  5. HTN -BPok - no changes made today.    6. COVID-19 Education: The signs and symptoms of COVID-19 were discussed with the patient and how to seek care for testing (follow up with PCP or arrange E-visit).  The importance of social distancing, staying at home, hand hygiene and wearing a mask when out in public were discussed today.  Current medicines are reviewed with the patient today.  The patient does not have concerns regarding medicines other than what has been noted above.  The following changes have been made:  See above.  Labs/ tests ordered today include:   No orders of the defined types were placed in this encounter.    Disposition:   FU with me prn - overall prognosis is quite poor. He has follow up with oncology tomorrow - has active infection - probable needs to see if this can be a virtual visit just to help with everyone's safety.   Patient is agreeable to this plan and will call if any problems develop in the interim.   SignedTruitt Merle, NP  03/07/2019 12:22 PM  [facility] [address] [place], [place]  [address] Phone: [phone] Fax: [phone]

## 2019-03-07 ENCOUNTER — Encounter: Payer: Self-pay | Admitting: Nurse Practitioner

## 2019-03-07 ENCOUNTER — Other Ambulatory Visit: Payer: Self-pay

## 2019-03-07 ENCOUNTER — Ambulatory Visit (INDEPENDENT_AMBULATORY_CARE_PROVIDER_SITE_OTHER): Payer: Medicare Other | Admitting: Nurse Practitioner

## 2019-03-07 VITALS — BP 122/60 | HR 77 | Ht 72.0 in | Wt 189.0 lb

## 2019-03-07 DIAGNOSIS — C20 Malignant neoplasm of rectum: Secondary | ICD-10-CM | POA: Diagnosis not present

## 2019-03-07 DIAGNOSIS — R609 Edema, unspecified: Secondary | ICD-10-CM

## 2019-03-07 DIAGNOSIS — I4819 Other persistent atrial fibrillation: Secondary | ICD-10-CM

## 2019-03-07 NOTE — Patient Instructions (Addendum)
After Visit Summary:  We will be checking the following labs today - NONE   Medication Instructions:    Continue with your current medicines.    If you need a refill on your cardiac medications before your next appointment, please call your pharmacy.     Testing/Procedures To Be Arranged:  N/A    At [facility], you and your health needs are our priority.  As part of our continuing mission to provide you with exceptional heart care, we have created designated Provider Care Teams.  These Care Teams include your primary Cardiologist (physician) and Advanced Practice Providers (APPs -  Physician Assistants and Nurse Practitioners) who all work together to provide you with the care you need, when you need it.    Call the [place] office at [phone] if you have any questions, problems or concerns.

## 2019-03-08 ENCOUNTER — Inpatient Hospital Stay: Payer: Medicare Other

## 2019-03-08 ENCOUNTER — Telehealth: Payer: Self-pay | Admitting: Nurse Practitioner

## 2019-03-08 ENCOUNTER — Inpatient Hospital Stay: Payer: Medicare Other | Attending: Hematology | Admitting: Hematology

## 2019-03-08 ENCOUNTER — Telehealth: Payer: Self-pay

## 2019-03-08 ENCOUNTER — Encounter: Payer: Self-pay | Admitting: Hematology

## 2019-03-08 DIAGNOSIS — I4819 Other persistent atrial fibrillation: Secondary | ICD-10-CM | POA: Diagnosis not present

## 2019-03-08 DIAGNOSIS — Z9221 Personal history of antineoplastic chemotherapy: Secondary | ICD-10-CM

## 2019-03-08 DIAGNOSIS — C20 Malignant neoplasm of rectum: Secondary | ICD-10-CM

## 2019-03-08 NOTE — Telephone Encounter (Signed)
Cecille Rubin called [facility] today to talk with nurse, Katharine Look regarding pts wellbeing.due to pt calling office. Pt was seen yesterday in office with [name], NP and was in poor condition.  Pt calling in today to go over medications.  This is highly unusual for pt to call in. No medications were changed yesterday. Cecille Rubin stated to [place] about pts appearance.yesterday that was poor and was talked with pt to move to assisted or skilled nursing.  Katharine Look stated have been keeping eye on pt for weeks due to decline in health. Pt is moving today to skilled nursing  and medications will be done when pt is moved. Cecille Rubin gave verbal order for pt to be moved incase [facility] could not get in touch with pt's PCP, Dr. Rex Kras.Marland Kitchen

## 2019-03-08 NOTE — Telephone Encounter (Signed)
-----   Message from [name], MD sent at 03/07/2019 10:49 PM EDT ----- Malachy Mood,  He has an appointment with me at 1pm tomorrow, he saw cardiology today. Please call him to see if he still wants to come in, or change his appointment to a virtual visit, I plan to stop his cancer treatment, need to discuss supportive care and possible hospice.   I will certainly be happy to see him if he wants to come in.  Thanks  Krista Blue

## 2019-03-08 NOTE — Telephone Encounter (Signed)
Spoke with patient regarding appointments today, per Dr. Burr Medico offered phone visit as we are not planning on infusion today, patient agreed to phone visit. Cancelled lab and infusion, made his 1:00 appointment a phone visit.

## 2019-03-08 NOTE — Telephone Encounter (Signed)
New message:    Patient calling stating he would like for some one to call him concerning medication. Patient is not sure what he should be taken. Please call patient.

## 2019-03-09 ENCOUNTER — Non-Acute Institutional Stay (SKILLED_NURSING_FACILITY): Payer: Medicare Other | Admitting: Adult Health

## 2019-03-09 ENCOUNTER — Encounter: Payer: Self-pay | Admitting: Adult Health

## 2019-03-09 ENCOUNTER — Telehealth: Payer: Self-pay | Admitting: Hematology

## 2019-03-09 DIAGNOSIS — R531 Weakness: Secondary | ICD-10-CM | POA: Diagnosis not present

## 2019-03-09 DIAGNOSIS — L03119 Cellulitis of unspecified part of limb: Secondary | ICD-10-CM

## 2019-03-09 DIAGNOSIS — H209 Unspecified iridocyclitis: Secondary | ICD-10-CM

## 2019-03-09 DIAGNOSIS — R234 Changes in skin texture: Secondary | ICD-10-CM | POA: Diagnosis not present

## 2019-03-09 DIAGNOSIS — B370 Candidal stomatitis: Secondary | ICD-10-CM

## 2019-03-09 DIAGNOSIS — I10 Essential (primary) hypertension: Secondary | ICD-10-CM | POA: Diagnosis not present

## 2019-03-09 DIAGNOSIS — I4819 Other persistent atrial fibrillation: Secondary | ICD-10-CM

## 2019-03-09 DIAGNOSIS — Z789 Other specified health status: Secondary | ICD-10-CM

## 2019-03-09 DIAGNOSIS — R4689 Other symptoms and signs involving appearance and behavior: Secondary | ICD-10-CM

## 2019-03-09 DIAGNOSIS — C20 Malignant neoplasm of rectum: Secondary | ICD-10-CM | POA: Diagnosis not present

## 2019-03-09 NOTE — Telephone Encounter (Signed)
Scheduled appt per 7/1 los.Marland KitchenMarland KitchenCalled patient and patient did not answer. Patient will get a print out at his next appt.

## 2019-03-09 NOTE — Progress Notes (Addendum)
Location:  Occupational psychologist of Service:  SNF (31) Provider:   Cindi Carbon, [place] [phone]   [name], MD  Patient Care Team: [name], MD as PCP - General (Family Medicine) Ronald Lobo, MD as Consulting Physician (Gastroenterology) Tania Ade, RN as Registered Nurse Kyung Rudd, MD as Consulting Physician (Radiation Oncology) [name], MD as Consulting Physician (Hematology) Gayland Curry, DO as Consulting Physician (Geriatric Medicine) Royal Hawthorn, NP as Nurse Practitioner (Nurse Practitioner)  Extended Emergency Contact Information Primary Emergency Contact: Anselm Pancoast, [place] [address] [address] [place] of [place] Phone: [phone] Relation: Son Secondary Emergency Contact: Lequita Asal, [place] of [place] Phone: [phone] Mobile Phone: [phone] Relation: Daughter  Code Status:  DNR Goals of care: Advanced Directive information Advanced Directives 03/09/2019  Does Patient Have a Medical Advance Directive? Yes  Type of Paramedic of [place];Living will;Out of facility DNR (pink MOST or yellow form)  Does patient want to make changes to medical advance directive? No - Guardian declined  Copy of [place] in Chart? No - copy requested  Pre-existing out of facility DNR order (yellow form or pink MOST form) Yellow form placed in chart (order not valid for inpatient use)     Chief Complaint  Patient presents with  . Acute Visit    leg swelling, decline in functional status    HPI:  Pt is a 85 y.o. male seen today for an acute visit for leg swelling and decline in functional status. He has a hx of metastatic rectal cancer stage IIIB  With a biopsy showing adenocarcinoma in 2016.  He has received radiation and chemotherapy and was seen by Dr. Burr Medico on 6/30 with recommendations to discontinue oral  chemotherapy due to decline in his status and lack of response. He is a DNR and goals of care are comfort based.  He is admitted to skilled care 03/09/2019 due to increased weakness, inability to self care, and help with med management. He has bilat lower ext cellulitis.  He said his doctor called in an antibiotic but we have not seen any records indicating this. His legs are swollen and red and leaking all over his pants and on the floor. He reports that his legs are restless and that his heels hurt. He reports that during his treatment with chemo he has had dry cracked and painful skin to his lips and hands. He is losing his toenails due to the medication as well.  He has reports mouth discomfort but denies difficulty swallowing or chewing. There is concern that he was not taking his medication at home correctly. He has right eye redness and drainage. His eye doc was notified and Pred forte ordered. The eye has been bothersome for a couple of weeks by his account. No change in vision. He wears glasses.  He uses a motorized chair and is minimally ambulatory.    Past Medical History:  Diagnosis Date  . Anemia due to antineoplastic chemotherapy   . Atherosclerosis of coronary artery cardiologist-  dr Aundra Dubin   aorta and coronary arteries moderate per ct 02-07-2016  . Basal cell carcinoma    40+  . Bladder calculi   . BPH (benign prostatic hyperplasia)   . Calculi, urethra   . Chronic low back pain   . Constipation   . ED (  erectile dysfunction)   . Edema of lower extremity   . Family history of melanoma   . Foley catheter in place   . History of adenomatous polyp of colon    2007  . History of basal cell carcinoma excision    multiple  . History of cellulitis    left lower leg-- resolved  . History of melanoma excision    temple right side/ multiple  . History of obstructive sleep apnea    per pt cured by hypnosis  . History of pericarditis    acute episode 09/ 2014  per cardiologist note  , dr Aundra Dubin , related possibly to xarelto use  . History of ulcerative colitis    remote  . HTN (hypertension)   . Hypercholesteremia   . Hypothyroidism   . Incomplete right bundle branch block   . LAFB (left anterior fascicular block)   . Melanoma ([place])   . OA (osteoarthritis)   . PAF (paroxysmal atrial fibrillation) [facility]) cardiologist-  dr [place] Aundra Dubin   dx 09/ 2014  in setting of acute pericarditis   . Rectal cancer [facility]) dx 07-16-2015  oncologist-  dr [name]   Stage IIIB (cT3, N1, M0)-- concurrent  chemotherapy started 08-12-2015/  radiation complete (08-12-2015 to 09-20-2015)  . S/P radiation therapy 08/12/15-09/20/15   rectal ca50.4Gy total dose  . Spinal stenosis    Past Surgical History:  Procedure Laterality Date  . CATARACT EXTRACTION W/ INTRAOCULAR LENS IMPLANT Bilateral 10/ 2016  . CYSTOSCOPY WITH LITHOLAPAXY N/A 02/12/2016   Procedure: CYSTOSCOPY WITH LITHOLAPAXY;  Surgeon: Alexis Frock, MD;  Location: [facility];  Service: Urology;  Laterality: N/A;  . EUS N/A 08/08/2015   Procedure: LOWER ENDOSCOPIC ULTRASOUND (EUS);  Surgeon: Arta Silence, MD;  Location: [facility] ENDOSCOPY;  Service: Endoscopy;  Laterality: N/A;  . FLEXIBLE SIGMOIDOSCOPY N/A 08/08/2015   Procedure: FLEXIBLE SIGMOIDOSCOPY;  Surgeon: Arta Silence, MD;  Location: [facility] ENDOSCOPY;  Service: Endoscopy;  Laterality: N/A;  poor prep  . HOLMIUM LASER APPLICATION N/A 01/10/2129   Procedure: HOLMIUM LASER APPLICATION;  Surgeon: Alexis Frock, MD;  Location: [facility];  Service: Urology;  Laterality: N/A;  . INGUINAL HERNIA REPAIR Left 1965  . LEFT KNEE SURGERY  1940  . MOHS SURGERY  x9  last one 2014   . ORIF LEFT ANKLE FX  1935  . ROTATOR CUFF REPAIR Bilateral right 2013/  left 2010  . TRANSTHORACIC ECHOCARDIOGRAM  06-06-2013   ef 55-60%/  mild AR/  mild dilated aortic root/  mild LAE and RAE/  mild to moderate TR/  small pericardial effusion identified    Allergies  Allergen  Reactions  . Cinnamon Itching  . Ibuprofen Diarrhea  . Percocet [Oxycodone-Acetaminophen] Other (See Comments)    confusion  . Protonix [Pantoprazole Sodium] Diarrhea and Nausea And Vomiting  . Xarelto [Rivaroxaban] Other (See Comments)    Diarrhea, dehydration    Outpatient Encounter Medications as of 03/09/2019  Medication Sig  . prednisoLONE acetate (PRED FORTE) 1 % ophthalmic suspension 1 drop 4 (four) times daily.  . vitamin B-12 (CYANOCOBALAMIN) 1000 MCG tablet Take 1,000 mcg by mouth daily.  Marland Kitchen aspirin EC 325 MG tablet Take 325 mg by mouth daily.   . finasteride (PROSCAR) 5 MG tablet Take 5 mg by mouth every evening. Dose is taken at bedtime  . levothyroxine (SYNTHROID) 112 MCG tablet Take 1 tablet (112 mcg total) by mouth daily before breakfast.  . magic mouthwash w/lidocaine SOLN Take 5 mLs by  mouth 3 (three) times daily. Lidocaine 8mL, Diphenhyramine 76mL, Maalox 35mL  Swish and Spit 15mL 3 times daily as needed  . metoprolol succinate (TOPROL-XL) 25 MG 24 hr tablet TAKE 1/2 TABLET BY MOUTH DAILY.  . Multiple Vitamin (MULTIVITAMIN WITH MINERALS) TABS tablet Take 1 tablet by mouth daily.  . ondansetron (ZOFRAN) 4 MG tablet Take 1-2 tablets (4-8 mg total) by mouth every 8 (eight) hours as needed for nausea or vomiting.  . potassium chloride (K-DUR) 10 MEQ tablet TAKE 1 TABLET BY MOUTH EVERY DAY  . torsemide (DEMADEX) 20 MG tablet Take 20 mg by mouth daily.  . [DISCONTINUED] capecitabine (XELODA) 500 MG tablet TAKE 3 TABLETS (1500MG ) BY MOUTH IN AM & 2 TABS (1000MG ) IN PM, IMMEDIATELY AFTER FOOD, TAKE ON DAYS 1-7 & 15-21 OF EACH 28D CYCLE  . [DISCONTINUED] cephALEXin (KEFLEX) 500 MG capsule Take 1 capsule (500 mg total) by mouth 2 (two) times daily.   No facility-administered encounter medications on file as of 03/09/2019.     Review of Systems  Constitutional: Positive for activity change and fatigue. Negative for appetite change, chills, diaphoresis, fever and unexpected weight  change.  HENT: Negative for congestion, sore throat and trouble swallowing.   Eyes: Positive for discharge and redness. Negative for photophobia, pain, itching and visual disturbance.  Respiratory: Negative for cough, shortness of breath, wheezing and stridor.   Cardiovascular: Positive for leg swelling. Negative for chest pain and palpitations.  Gastrointestinal: Negative for abdominal distention, abdominal pain, constipation and diarrhea.  Genitourinary: Negative for difficulty urinating and dysuria.  Musculoskeletal: Positive for gait problem. Negative for arthralgias, back pain, joint swelling and myalgias.  Skin: Positive for rash and wound.       Dry cracked skin  Neurological: Positive for weakness. Negative for dizziness, seizures, syncope, facial asymmetry, speech difficulty and headaches.  Hematological: Negative for adenopathy. Does not bruise/bleed easily.  Psychiatric/Behavioral: Positive for confusion. Negative for agitation and behavioral problems.    Immunization History  Administered Date(s) Administered  . Influenza Split 06/07/2015  . Influenza-Unspecified 07/02/2016, 06/28/2017   Pertinent  Health Maintenance Due  Topic Date Due  . PNA vac Low Risk Adult (1 of 2 - PCV13) 03/11/1987  . INFLUENZA VACCINE  18-Apr-2019   Fall Risk  04/13/2017 09/30/2016 03/04/2016  Falls in the past year? No No Yes  Number falls in past yr: - - 1  Injury with Fall? - - No   Functional Status Survey:    Vitals:   03/09/19 1529  BP: (!) 107/58  Pulse: 60  Resp: 20   There is no height or weight on file to calculate BMI. Physical Exam Vitals signs and nursing note reviewed.  Constitutional:      General: He is not in acute distress.    Appearance: He is not diaphoretic.  HENT:     Head: Normocephalic and atraumatic.     Nose: Nose normal. No congestion.     Mouth/Throat:     Mouth: Mucous membranes are moist.     Pharynx: No oropharyngeal exudate.     Comments: White patches  to tongue and throat Eyes:     General:        Right eye: Discharge present. No foreign body or hordeolum.        Left eye: No discharge.     Conjunctiva/sclera:     Right eye: Right conjunctiva is injected. Exudate present.     Left eye: Left conjunctiva is injected. No exudate.  Pupils: Pupils are equal, round, and reactive to light.  Neck:     Musculoskeletal: Normal range of motion and neck supple.     Thyroid: No thyromegaly.     Vascular: No JVD.     Trachea: No tracheal deviation.  Cardiovascular:     Rate and Rhythm: Normal rate. Rhythm irregular.     Heart sounds: No murmur.  Pulmonary:     Effort: Pulmonary effort is normal. No respiratory distress.     Breath sounds: Normal breath sounds. No wheezing.  Abdominal:     General: Bowel sounds are normal. There is no distension.     Palpations: Abdomen is soft.     Tenderness: There is no abdominal tenderness.  Lymphadenopathy:     Cervical: No cervical adenopathy.  Skin:    General: Skin is warm and dry.     Findings: Erythema present.       Neurological:     Mental Status: He is alert and oriented to person, place, and time.     Cranial Nerves: No cranial nerve deficit.  Psychiatric:        Mood and Affect: Mood normal.     Labs reviewed: Recent Labs    01/25/19 1218 02/08/19 1052 02/22/19 1257  NA 138 137 137  K 4.3 4.1 4.3  CL 98 100 101  CO2 28 25 22   GLUCOSE 103* 94 133*  BUN 21 30* 30*  CREATININE 1.52* 1.55* 1.56*  CALCIUM 9.3 9.0 9.0  MG 2.2 2.0 1.9   Recent Labs    01/25/19 1218 02/08/19 1052 02/22/19 1257  AST 22 22 32  ALT 16 12 23   ALKPHOS 116 114 108  BILITOT 0.7 0.6 0.5  PROT 7.2 6.7 6.7  ALBUMIN 3.7 3.5 2.8*   Recent Labs    01/25/19 1218 02/08/19 1052 02/22/19 1257  WBC 4.2 6.0 7.5  NEUTROABS 2.9 4.1 5.4  HGB 11.9* 11.5* 10.3*  HCT 36.9* 35.2* 31.6*  MCV 96.9 97.0 98.4  PLT 207 218 259   Lab Results  Component Value Date   TSH 5.640 (H) 09/28/2018   No results  found for: HGBA1C No results found for: CHOL, HDL, LDLCALC, LDLDIRECT, TRIG, CHOLHDL  Significant Diagnostic Results in last 30 days:  No results found.  Assessment/Plan 1. Cellulitis of lower extremity, unspecified laterality Doxycycline 100 mg bid x 10 days with food Wound care nurse to assess for compression wraps and dressing to the lower ext.  Recommend elevation.  2. Oral thrush Diflucan 150 mg x 1 dose Clotrimazole troche 1 tab QID x 10 days  Magic mouth wash tid prn   3. Uveitis ? If this was caused by chemo Ophthalmology prescribed Pred forte x 1 week, if no improvement consider antibiotic drop.  Warm compresses 15 min tid x 72 hrs  4. Weakness Due to progressive decline associated with rectal cancer and chemotherapy. Supportive care in the skilled environment.   5. Self-care deficit in patient living alone Moved to skilled care today due to this issue as he was not able to manage his meds, care for his health, and needs help with dressing and bathing   6. Rectal cancer ([place]) Followed by Dr. Burr Medico Off treatment at this time Progressive decline and possible consideration for hospice Comfort care goals F/U with Dr. Mariea Clonts next week   7. Persistent atrial fibrillation Rate controlled, not on anticoagulation per cards Continue aspirin 325 mg qd   8. Essential hypertension Controlled, continue metoprolol   9.  Cracked skin Aquaphor to affected area qhs Hydrocortisone to both hands bid x 1 week    Family/ staff Communication: resident and staff   Labs/tests ordered:  NA

## 2019-03-10 DIAGNOSIS — Z20828 Contact with and (suspected) exposure to other viral communicable diseases: Secondary | ICD-10-CM | POA: Diagnosis not present

## 2019-03-13 DIAGNOSIS — Z20828 Contact with and (suspected) exposure to other viral communicable diseases: Secondary | ICD-10-CM | POA: Diagnosis not present

## 2019-03-14 ENCOUNTER — Non-Acute Institutional Stay (SKILLED_NURSING_FACILITY): Payer: Medicare Other | Admitting: Internal Medicine

## 2019-03-14 ENCOUNTER — Encounter: Payer: Self-pay | Admitting: Internal Medicine

## 2019-03-14 DIAGNOSIS — Z602 Problems related to living alone: Secondary | ICD-10-CM

## 2019-03-14 DIAGNOSIS — L03119 Cellulitis of unspecified part of limb: Secondary | ICD-10-CM | POA: Diagnosis not present

## 2019-03-14 DIAGNOSIS — R531 Weakness: Secondary | ICD-10-CM

## 2019-03-14 DIAGNOSIS — H209 Unspecified iridocyclitis: Secondary | ICD-10-CM | POA: Diagnosis not present

## 2019-03-14 DIAGNOSIS — I10 Essential (primary) hypertension: Secondary | ICD-10-CM

## 2019-03-14 DIAGNOSIS — R4689 Other symptoms and signs involving appearance and behavior: Secondary | ICD-10-CM

## 2019-03-14 DIAGNOSIS — C20 Malignant neoplasm of rectum: Secondary | ICD-10-CM | POA: Diagnosis not present

## 2019-03-14 DIAGNOSIS — I4819 Other persistent atrial fibrillation: Secondary | ICD-10-CM | POA: Diagnosis not present

## 2019-03-14 DIAGNOSIS — B370 Candidal stomatitis: Secondary | ICD-10-CM

## 2019-03-14 DIAGNOSIS — Z789 Other specified health status: Secondary | ICD-10-CM

## 2019-03-14 DIAGNOSIS — I872 Venous insufficiency (chronic) (peripheral): Secondary | ICD-10-CM | POA: Diagnosis not present

## 2019-03-14 DIAGNOSIS — R234 Changes in skin texture: Secondary | ICD-10-CM

## 2019-03-14 NOTE — Progress Notes (Signed)
Patient ID: Nathaniel [name], male   DOB: [birth year], 85 y.o.   MRN: [id number]  Provider:  Rexene Edison. Mariea Clonts, D.O., C.M.D. Location:  [facility] Room Number: 462 Place of Service:  SNF (31)  PCP: [name], MD Patient Care Team: [name], MD as PCP - General (Family Medicine) Ronald Lobo, MD as Consulting Physician (Gastroenterology) Tania Ade, RN as Registered Nurse Kyung Rudd, MD as Consulting Physician (Radiation Oncology) [name], MD as Consulting Physician (Hematology) Gayland Curry, DO as Consulting Physician (Geriatric Medicine) Royal Hawthorn, NP as Nurse Practitioner (Nurse Practitioner)  Extended Emergency Contact Information Primary Emergency Contact: Anselm Pancoast,  [address] [address] of [place] Phone: [phone] Relation: Son Secondary Emergency Contact: Lequita Asal, [place] of [place] Phone: [phone] Mobile Phone: [phone] Relation: Daughter  Code Status: DNR, palliative care Goals of Care: Advanced Directive information Advanced Directives 03/09/2019  Does Patient Have a Medical Advance Directive? Yes  Type of Paramedic of [place];Living will;Out of facility DNR (pink MOST or yellow form)  Does patient want to make changes to medical advance directive? No - Guardian declined  Copy of [place] in Chart? No - copy requested  Pre-existing out of facility DNR order (yellow form or pink MOST form) Yellow form placed in chart (order not valid for inpatient use)   Chief Complaint  Patient presents with  . 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   He was struggling with managing his meds and doing his bathing and dressing on his own.  See NP note for his condition as of 03/09/19.    Legs remain swollen--wrapped in unnaboots which he reports at the last change this morning had considerably less drainage (had been dripping wet when he arrived to SNF).  He's on doxycycline '100mg'$  po bid for 10 days starting 7/2 due to some cellulitis from the open areas due to swelling.    Temp is running low.  Even was low at 93 rectally.  Left knee was injured in an accident at 85 yo.  He has some chronic pain in that knee and has "only 3/4 of a knee" there.  This affects his walking.  He also has back pain from spinal stenosis.    He notes he's gotten much weaker in the past week while he's been here.  He was able to walk easily with his rollator when he arrived, but now needs two people to get him up out of the chair to walk with his rollator.  He's in isolation due to covid-19 testing still pending (test 1/2 negative, second still pending) so he's not had PT, OT yet.  He is interested in having therapy to keep moving physically as long as he has the energy.  He's eating and drinking well.  Reports vision is worse in both eyes--NP notes he's had uveitis--under treatment with Dr. Ellie Lunch, ophthalmology.  Right lower lid is droopy so it's a bit red, but eyes themselves are not red.  He says he is aware that he will be here permanently.  His chemo treatments have been stopped due to his weakness, fatigue and skin toxicities so he knows that his cancer will continue to spread.  He reports that he thought based on his age that he would have passed away 15 yrs ago.  When he had cancer the first time 2.5 years ago, he was told he might live about this long so he's ready if it's his time.  Fortunately, he does not have GI-related complaints of abdominal pain, bleeding.    He reports that he's seen his  PCP, Dr. [name], for 30 years and he's partially retired.  He intends to switch over his care b/c he's too weak to go out to appts and can receive care here with myself and NP Wert.  He's meant to see Dr. Burr Medico in August with labs before, possibly a CT and cardiology in Sept as it stands.  At the time he last saw Dr. Burr Medico, they discussed hospice and he did not think he needed their support at that point.  He still has quite a bit of dryness of his mouth, tongue and lips, but no longer has white patches.    He's on aspirin only for his afib b/c of pericarditis with tamponade when on xarelto (? Related).    His skin of his hands was very dry upon admission and he's getting aquaphor and hydrocortisone for them.    Past Medical History:  Diagnosis Date  . Anemia due to antineoplastic chemotherapy   . Atherosclerosis of coronary artery cardiologist-  dr Aundra Dubin   aorta and coronary arteries moderate per ct 02-07-2016  . Basal cell carcinoma    40+  . Bladder calculi   . BPH (benign prostatic hyperplasia)   . Calculi, urethra   . Chronic low back pain   . Constipation   . ED (erectile dysfunction)   . Edema of lower extremity   . Family history of melanoma   . Foley catheter in place   . History of adenomatous polyp of colon    2007  . History of basal cell carcinoma excision    multiple  . History of cellulitis    left lower leg-- resolved  . History of melanoma excision    temple right side/ multiple  . History of obstructive sleep apnea    per pt cured by hypnosis  . History of pericarditis    acute episode 09/ 2014  per cardiologist note , dr Aundra Dubin , related possibly to xarelto use  . History of ulcerative colitis    remote  . HTN (hypertension)   . Hypercholesteremia   . Hypothyroidism   . Incomplete right bundle branch block   . LAFB (left anterior fascicular block)   . Melanoma ([place])   . OA (osteoarthritis)   . PAF (paroxysmal atrial fibrillation) [facility])  cardiologist-  dr dalton Aundra Dubin   dx 09/ 2014  in setting of acute pericarditis   . Rectal cancer [facility]) dx 07-16-2015  oncologist-  dr [name]   Stage IIIB (cT3, N1, M0)-- concurrent  chemotherapy started 08-12-2015/  radiation complete (08-12-2015 to 09-20-2015)  . S/P radiation therapy 08/12/15-09/20/15   rectal ca50.4Gy total dose  . Spinal stenosis    Past Surgical History:  Procedure Laterality Date  . CATARACT EXTRACTION W/ INTRAOCULAR LENS IMPLANT Bilateral 10/ 2016  . CYSTOSCOPY WITH LITHOLAPAXY N/A 02/12/2016   Procedure: CYSTOSCOPY WITH LITHOLAPAXY;  Surgeon: Alexis Frock, MD;  Location: [facility];  Service: Urology;  Laterality: N/A;  . EUS N/A 08/08/2015   Procedure: LOWER ENDOSCOPIC ULTRASOUND (EUS);  Surgeon: Arta Silence, MD;  Location: [facility] ENDOSCOPY;  Service: Endoscopy;  Laterality: N/A;  . FLEXIBLE SIGMOIDOSCOPY N/A 08/08/2015   Procedure: FLEXIBLE SIGMOIDOSCOPY;  Surgeon: Arta Silence, MD;  Location: [facility] ENDOSCOPY;  Service: Endoscopy;  Laterality: N/A;  poor prep  . HOLMIUM LASER APPLICATION N/A 0/0/9381   Procedure: HOLMIUM LASER APPLICATION;  Surgeon: Alexis Frock, MD;  Location: [facility];  Service: Urology;  Laterality: N/A;  . INGUINAL HERNIA REPAIR Left 1965  . LEFT KNEE SURGERY  1940  . MOHS SURGERY  x9  last one 2014   . ORIF LEFT ANKLE FX  1935  . ROTATOR CUFF REPAIR Bilateral right 2013/  left 2010  . TRANSTHORACIC ECHOCARDIOGRAM  06-06-2013   ef 55-60%/  mild AR/  mild dilated aortic root/  mild LAE and RAE/  mild to moderate TR/  small pericardial effusion identified    reports that he has never smoked. He has never used smokeless tobacco. He reports current alcohol use of about 1.0 standard drinks of alcohol per week. He reports that he does not use drugs. Social History   Socioeconomic History  . Marital status: Widowed    Spouse name: Not on file  . Number of children: Not on file  . Years of education: Not on  file  . Highest education level: Not on file  Occupational History  . Not on file  Social Needs  . Financial resource strain: Not on file  . Food insecurity    Worry: Not on file    Inability: Not on file  . Transportation needs    Medical: Not on file    Non-medical: Not on file  Tobacco Use  . Smoking status: Never Smoker  . Smokeless tobacco: Never Used  Substance and Sexual Activity  . Alcohol use: Yes    Alcohol/week: 1.0 standard drinks    Types: 1 Glasses of wine per week    Comment: 1 glass of wine per week  . Drug use: No  . Sexual activity: Not on file  Lifestyle  . Physical activity    Days per week: Not on file    Minutes per session: Not on file  . Stress: Not on file  Relationships  . Social Herbalist on phone: Not on file    Gets together: Not on file    Attends religious service: Not on file    Active member of club or organization: Not on file    Attends meetings of clubs or organizations: Not on file    Relationship status: Not on file  . Intimate partner violence    Fear of current or ex partner: Not on file    Emotionally abused: Not on file    Physically abused: Not on file    Forced sexual activity: Not on file  Other Topics Concern  . 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 Stroke Father   . Heart disease Father   . Parkinson's disease Mother   . Heart disease Brother   .  Melanoma Brother 60       metastatic and secondary cancer  . Heart disease Maternal Uncle   . Cancer Daughter 54       muscle/deep in thigh cancer (myosarcoma? perhaps)  . Cancer Daughter        thyroid  . Cancer - Cervical Daughter        Parotid gland    Health Maintenance  Topic Date Due  . TETANUS/TDAP  03/10/1941  . PNA vac Low Risk Adult (1 of 2 - PCV13) 03/11/1987  . INFLUENZA VACCINE  04-12-2019    Allergies  Allergen Reactions  . Cinnamon Itching  . Ibuprofen Diarrhea  . Percocet [Oxycodone-Acetaminophen] Other (See Comments)    confusion  . Protonix [Pantoprazole Sodium] Diarrhea and Nausea And Vomiting  . Xarelto [Rivaroxaban] Other (See Comments)    Diarrhea, dehydration    Outpatient Encounter Medications as of 03/14/2019  Medication Sig  . acetaminophen (TYLENOL) 325 MG tablet Take 650 mg by mouth 3 (three) times daily as needed.  Marland Kitchen aspirin EC 325 MG tablet Take 325 mg by mouth daily.   . clotrimazole (MYCELEX) 10 MG troche Take 10 mg by mouth 4 (four) times daily.  Marland Kitchen doxycycline (DORYX) 100 MG EC tablet Take 100 mg by mouth 2 (two) times daily.  . finasteride (PROSCAR) 5 MG tablet Take 5 mg by mouth every evening. Dose is taken at bedtime  . hydrocortisone cream 1 % Apply 1 application topically 2 (two) times daily.   Marland Kitchen levothyroxine (SYNTHROID) 112 MCG tablet Take 1 tablet (112 mcg total) by mouth daily before breakfast.  . magic mouthwash w/lidocaine SOLN Take 5 mLs by mouth 3 (three) times daily. Lidocaine 86m, Diphenhyramine 865m Maalox 8072mSwish and Spit 5mL61mtimes daily as needed  . metoprolol succinate (TOPROL-XL) 25 MG 24 hr tablet TAKE 1/2 TABLET BY MOUTH DAILY.  . mineral oil-hydrophilic petrolatum (AQUAPHOR) ointment Apply 1 application topically at bedtime.  . Multiple Vitamin (MULTIVITAMIN WITH MINERALS) TABS tablet Take 1 tablet by mouth daily.  . ondansetron (ZOFRAN) 4 MG tablet Take 1-2 tablets (4-8 mg total) by mouth every 8 (eight) hours  as needed for nausea or vomiting.  . potassium chloride (K-DUR) 10 MEQ tablet TAKE 1 TABLET BY MOUTH EVERY DAY  . prednisoLONE acetate (PRED FORTE) 1 % ophthalmic suspension Place 1 drop into both eyes 4 (four) times daily.   . toMarland Kitchensemide (DEMADEX) 20 MG tablet Take 20 mg by mouth daily.  . vitamin B-12 (CYANOCOBALAMIN) 1000 MCG tablet Take 1,000 mcg by mouth daily.   No facility-administered encounter medications on file as of 03/14/2019.     Review of Systems  Constitutional: Positive for activity change and unexpected weight change (weight 212 10/05/18 and now 185 lbs). Negative for appetite change, chills and fever.  HENT: Positive for hearing loss. Negative for congestion.   Eyes: Negative for pain and itching.       Vision blurry, right drooping lower lid; glasses  Respiratory: Negative for chest tightness and shortness of breath.   Cardiovascular: Positive for leg swelling. Negative for chest pain and palpitations.  Gastrointestinal: Positive for rectal pain. Negative for abdominal pain, anal bleeding, blood in stool, constipation, diarrhea, nausea and  vomiting.  Endocrine:       Hypothermia  Genitourinary: Negative for dysuria.  Musculoskeletal: Positive for arthralgias, back pain and gait problem. Negative for joint swelling, myalgias and neck pain.  Skin: Negative for color change.  Neurological: Positive for weakness. Negative for dizziness.  Psychiatric/Behavioral: Negative for agitation, confusion and sleep disturbance. The patient is not nervous/anxious.     Vitals:   03/14/19 1037  BP: 138/84  Pulse: 76  Resp: 18  Temp: 98.5 F (36.9 C)  TempSrc: Oral  SpO2: 96%  Weight: 185 lb (83.9 kg)  Height: 6' (1.829 m)   Body mass index is 25.09 kg/m. Physical Exam Vitals signs and nursing note reviewed.  Constitutional:      General: He is not in acute distress.    Appearance: Normal appearance. He is normal weight. He is not toxic-appearing.  HENT:     Head:  Normocephalic and atraumatic.     Ears:     Comments: HOH, hearing aids    Nose: Nose normal.     Mouth/Throat:     Pharynx: Oropharynx is clear.     Comments: Erythema and dryness of tip of tongue, lips very dry and scaly making it hard for him to talk Eyes:     Comments: Right lower lid droop, glasses  Neck:     Musculoskeletal: Normal range of motion.  Cardiovascular:     Comments: irreg irreg Pulmonary:     Effort: Pulmonary effort is normal.     Breath sounds: Normal breath sounds.  Abdominal:     General: Bowel sounds are normal.  Musculoskeletal: Normal range of motion.        General: Tenderness present.     Comments: Left medial knee tender; bilateral lower legs wrapped in kerlix and coban--appear swollen--no visible drainage through all layers  Skin:    General: Skin is warm and dry.  Neurological:     General: No focal deficit present.     Mental Status: He is alert and oriented to person, place, and time.     Cranial Nerves: No cranial nerve deficit.     Motor: Weakness present.     Comments: Minimal word-finding challenges; reports having met me in rehab, but I see no notes from a prior meeting  Psychiatric:        Mood and Affect: Mood normal.        Behavior: Behavior normal.     Labs reviewed: Basic Metabolic Panel: Recent Labs    01/25/19 1218 02/08/19 1052 02/22/19 1257  NA 138 137 137  K 4.3 4.1 4.3  CL 98 100 101  CO2 '28 25 22  '$ GLUCOSE 103* 94 133*  BUN 21 30* 30*  CREATININE 1.52* 1.55* 1.56*  CALCIUM 9.3 9.0 9.0  MG 2.2 2.0 1.9   Liver Function Tests: Recent Labs    01/25/19 1218 02/08/19 1052 02/22/19 1257  AST 22 22 32  ALT '16 12 23  '$ ALKPHOS 116 114 108  BILITOT 0.7 0.6 0.5  PROT 7.2 6.7 6.7  ALBUMIN 3.7 3.5 2.8*   No results for input(s): LIPASE, AMYLASE in the last 8760 hours. No results for input(s): AMMONIA in the last 8760 hours. CBC: Recent Labs    01/25/19 1218 02/08/19 1052 02/22/19 1257  WBC 4.2 6.0 7.5   NEUTROABS 2.9 4.1 5.4  HGB 11.9* 11.5* 10.3*  HCT 36.9* 35.2* 31.6*  MCV 96.9 97.0 98.4  PLT 207 218 259   Cardiac Enzymes: No results for input(s):  CKTOTAL, CKMB, CKMBINDEX, TROPONINI in the last 8760 hours. BNP: Invalid input(s): POCBNP No results found for: HGBA1C Lab Results  Component Value Date   TSH 5.640 (H) 09/28/2018   Assessment/Plan 1. Rectal cancer ([place]) -now off palliative chemotherapy due to toxicities and functional decline -continue supportive care here in SNF -when second covid test returns negative, he can begin PT, OT which may be helpful at least short-term  2. Self-care deficit in patient living alone -continue ADL support in SNF  3. Edema of both lower extremities due to peripheral venous insufficiency -due to CHF, malnutrition, afib, dependence, venous insufficiency -elevate, continue compression wraps as ordered--open areas are starting to dry up per pt report (wrapped prior to my visit so I could not visualize them)  4. Cellulitis of lower extremity, unspecified laterality -complete course of doxycycline for 10 days as ordered  5. Oral thrush -appears to be improving -continue clotrimazole as ordered and prn magic mouthwash  6. Uveitis -reports worsening vision despite steroid drops -will need Dr. Ellie Lunch to be notified--I see no evidence of infection to warrant abx  7. Weakness -due to cancer and toxicities from chemo in 85 yo -will attempt some PT, OT when out of isolation, but would not be aggressive with him--palliative care as directed by patient  8. Persistent atrial fibrillation -stable, cont asa, toprol-xl rate control  9. Essential hypertension -bp controlled, no changes needed at present to regimen  10. Cracked skin -cont aquaphor and steroid cream to hands   Family/ staff Communication: discussed with SNF nurse   Labs/tests ordered: no new  Akia Montalban L. Malyk Girouard, D.O. [facility]  [address], [place] [address] Cell Phone (Mon-Fri 8am-5pm):  [phone] On Call:  [phone] & follow prompts after 5pm & weekends Office Phone:  [phone] Office Fax:  [phone]

## 2019-03-20 ENCOUNTER — Non-Acute Institutional Stay (SKILLED_NURSING_FACILITY): Payer: Medicare Other | Admitting: Adult Health

## 2019-03-20 ENCOUNTER — Encounter: Payer: Self-pay | Admitting: Adult Health

## 2019-03-20 DIAGNOSIS — R5383 Other fatigue: Secondary | ICD-10-CM | POA: Diagnosis not present

## 2019-03-20 DIAGNOSIS — L03119 Cellulitis of unspecified part of limb: Secondary | ICD-10-CM | POA: Diagnosis not present

## 2019-03-20 DIAGNOSIS — L89619 Pressure ulcer of right heel, unspecified stage: Secondary | ICD-10-CM

## 2019-03-20 NOTE — Progress Notes (Signed)
Location:  Occupational psychologist of Service:  SNF (31) Provider:   Cindi Carbon, ANP [place] [phone]   Gayland Curry, DO  Patient Care Team: Gayland Curry, DO as PCP - General (Geriatric Medicine) Ronald Lobo, MD as Consulting Physician (Gastroenterology) Tania Ade, RN as Registered Nurse Kyung Rudd, MD as Consulting Physician (Radiation Oncology) [name], MD as Consulting Physician (Hematology) Gayland Curry, DO as Consulting Physician (Geriatric Medicine) Royal Hawthorn, NP as Nurse Practitioner (Nurse Practitioner) Royal Hawthorn, NP as Nurse Practitioner (Nurse Practitioner)  Extended Emergency Contact Information Primary Emergency Contact: Anselm Pancoast, [place] [address] Johnnette Litter of [place] Phone: [phone] Relation: Son Secondary Emergency Contact: Lequita Asal, [place] Phone: [phone] Mobile Phone: [phone] Relation: Daughter  Code Status:  DNR Goals of care: Advanced Directive information Advanced Directives 03/09/2019  Does Patient Have a Medical Advance Directive? Yes  Type of Paramedic of [place];Living will;Out of facility DNR (pink MOST or yellow form)  Does patient want to make changes to medical advance directive? No - Guardian declined  Copy of [place] in Chart? No - copy requested  Pre-existing out of facility DNR order (yellow form or pink MOST form) Yellow form placed in chart (order not valid for inpatient use)     Chief Complaint  Patient presents with   Acute Visit    f/u cellulitis    HPI:  Pt is a 85 y.o. [name] seen today for an acute visit for follow up regarding cellulitis. He was placed on Doxycyline on 7/2 for 10 days due to redness and swelling of both legs with drainage. He has been receiving compression wraps and dressing changes. There is less drainage and  redness per reports.   His temp as been running low, 93.7.  He denies feeling cold or having chills. He denies sob or pain. He has a wound to the right heel with eschar tissue that is tender with weight bearing. His is supposed to work with therapy but that has been on hold while we waited on his Covid screening test which returned neg over the weekend. He has an underlying hx of rectal cancer, recently taken off chemotherapy due to lack of response and s/e.  The nurse reports that he is having more trouble getting his words out and is sleep at times. His weight is stable at 191 lbs but he had a periods of weight loss in the out patient setting.    Past Medical History:  Diagnosis Date   Anemia due to antineoplastic chemotherapy    Atherosclerosis of coronary artery cardiologist-  dr Aundra Dubin   aorta and coronary arteries moderate per ct 02-07-2016   Basal cell carcinoma    40+   Bladder calculi    BPH (benign prostatic hyperplasia)    Calculi, urethra    Chronic low back pain    Constipation    ED (erectile dysfunction)    Edema of lower extremity    Family history of melanoma    Foley catheter in place    History of adenomatous polyp of colon    2007   History of basal cell carcinoma excision    multiple   History of cellulitis    left lower leg-- resolved   History of melanoma excision    temple right side/  multiple   History of obstructive sleep apnea    per pt cured by hypnosis   History of pericarditis    acute episode 09/ 2014  per cardiologist note , dr Aundra Dubin , related possibly to xarelto use   History of ulcerative colitis    remote   HTN (hypertension)    Hypercholesteremia    Hypothyroidism    Incomplete right bundle branch block    LAFB (left anterior fascicular block)    Melanoma ([place])    OA (osteoarthritis)    PAF (paroxysmal atrial fibrillation) [facility]) cardiologist-  dr dalton Aundra Dubin   dx 09/ 2014  in setting of acute pericarditis     Rectal cancer [facility]) dx 07-16-2015  oncologist-  dr Krista Blue feng   Stage IIIB (cT3, N1, M0)-- concurrent  chemotherapy started 08-12-2015/  radiation complete (08-12-2015 to 09-20-2015)   S/P radiation therapy 08/12/15-09/20/15   rectal ca50.4Gy total dose   Spinal stenosis    Past Surgical History:  Procedure Laterality Date   CATARACT EXTRACTION W/ INTRAOCULAR LENS IMPLANT Bilateral 10/ 2016   CYSTOSCOPY WITH LITHOLAPAXY N/A 02/12/2016   Procedure: CYSTOSCOPY WITH LITHOLAPAXY;  Surgeon: Alexis Frock, MD;  Location: [facility];  Service: Urology;  Laterality: N/A;   EUS N/A 08/08/2015   Procedure: LOWER ENDOSCOPIC ULTRASOUND (EUS);  Surgeon: Arta Silence, MD;  Location[facility] ENDOSCOPY;  Service: Endoscopy;  Laterality: N/A;   FLEXIBLE SIGMOIDOSCOPY N/A 08/08/2015   Procedure: FLEXIBLE SIGMOIDOSCOPY;  Surgeon: Arta Silence, MD;  Location: [facility] ENDOSCOPY;  Service: Endoscopy;  Laterality: N/A;  poor prep   HOLMIUM LASER APPLICATION N/A 12/12/8293   Procedure: HOLMIUM LASER APPLICATION;  Surgeon: Alexis Frock, MD;  Location: [facility];  Service: Urology;  Laterality: N/A;   INGUINAL HERNIA REPAIR Left 1965   LEFT KNEE SURGERY  1940   MOHS SURGERY  x9  last one 2014    ORIF LEFT ANKLE FX  1935   ROTATOR CUFF REPAIR Bilateral right 2013/  left 2010   TRANSTHORACIC ECHOCARDIOGRAM  06-06-2013   ef 55-60%/  mild AR/  mild dilated aortic root/  mild LAE and RAE/  mild to moderate TR/  small pericardial effusion identified    Allergies  Allergen Reactions   Cinnamon Itching   Ibuprofen Diarrhea   Percocet [Oxycodone-Acetaminophen] Other (See Comments)    confusion   Protonix [Pantoprazole Sodium] Diarrhea and Nausea And Vomiting   Xarelto [Rivaroxaban] Other (See Comments)    Diarrhea, dehydration    Outpatient Encounter Medications as of 03/20/2019  Medication Sig   acetaminophen (TYLENOL) 325 MG tablet Take 650 mg by mouth 3 (three) times  daily as needed.   aspirin EC 325 MG tablet Take 325 mg by mouth daily.    finasteride (PROSCAR) 5 MG tablet Take 5 mg by mouth every evening. Dose is taken at bedtime   hydrocortisone cream 1 % Apply 1 application topically 2 (two) times daily.    levothyroxine (SYNTHROID) 112 MCG tablet Take 1 tablet (112 mcg total) by mouth daily before breakfast.   magic mouthwash w/lidocaine SOLN Take 5 mLs by mouth 3 (three) times daily. Lidocaine 88mL, Diphenhyramine 76mL, Maalox 77mL  Swish and Spit 3mL 3 times daily as needed   metoprolol succinate (TOPROL-XL) 25 MG 24 hr tablet TAKE 1/2 TABLET BY MOUTH DAILY.   mineral oil-hydrophilic petrolatum (AQUAPHOR) ointment Apply 1 application topically at bedtime.   Multiple Vitamin (MULTIVITAMIN WITH MINERALS) TABS tablet Take 1 tablet by mouth daily.   ondansetron (ZOFRAN)  4 MG tablet Take 1-2 tablets (4-8 mg total) by mouth every 8 (eight) hours as needed for nausea or vomiting.   potassium chloride (K-DUR) 10 MEQ tablet TAKE 1 TABLET BY MOUTH EVERY DAY   torsemide (DEMADEX) 20 MG tablet Take 20 mg by mouth daily.   vitamin B-12 (CYANOCOBALAMIN) 1000 MCG tablet Take 1,000 mcg by mouth daily.   No facility-administered encounter medications on file as of 03/20/2019.     Review of Systems  Constitutional: Negative for activity change, appetite change, chills, diaphoresis, fatigue, fever and unexpected weight change.  Respiratory: Negative for cough, shortness of breath, wheezing and stridor.   Cardiovascular: Positive for leg swelling. Negative for chest pain and palpitations.  Gastrointestinal: Positive for abdominal pain. Negative for abdominal distention, constipation and diarrhea.  Genitourinary: Negative for difficulty urinating and dysuria.  Musculoskeletal: Positive for gait problem. Negative for arthralgias, back pain, joint swelling and myalgias.  Skin: Positive for color change and wound.  Neurological: Positive for weakness.  Negative for dizziness, seizures, syncope, facial asymmetry, speech difficulty and headaches.  Hematological: Negative for adenopathy. Does not bruise/bleed easily.  Psychiatric/Behavioral: Positive for confusion. Negative for agitation and behavioral problems.    Immunization History  Administered Date(s) Administered   Influenza Split 06/07/2015   Influenza-Unspecified 07/02/2016, 06/28/2017, 07/01/2018   Pneumococcal Polysaccharide-23 09/07/2010   Zoster 10/08/2012   Pertinent  Health Maintenance Due  Topic Date Due   PNA vac Low Risk Adult (2 of 2 - PCV13) 09/08/2011   INFLUENZA VACCINE  11-Apr-2019   Fall Risk  04/13/2017 09/30/2016 03/04/2016  Falls in the past year? No No Yes  Number falls in past yr: - - 1  Injury with Fall? - - No   Functional Status Survey:    Vitals:   03/20/19 1517  BP: 126/66  Pulse: (!) Nathaniel  Resp: 18  Temp: (!) 93.7 F (34.3 C)  SpO2: 95%  Weight: 191 lb 3.2 oz (86.7 kg)   Body mass index is 25.93 kg/m. Physical Exam Vitals signs and nursing note reviewed.  Constitutional:      General: He is not in acute distress.    Appearance: He is not diaphoretic.  HENT:     Head: Normocephalic and atraumatic.  Neck:     Thyroid: No thyromegaly.     Vascular: No JVD.     Trachea: No tracheal deviation.  Cardiovascular:     Rate and Rhythm: Normal rate and regular rhythm.     Heart sounds: No murmur.  Pulmonary:     Effort: Pulmonary effort is normal. No respiratory distress.     Breath sounds: Normal breath sounds. No wheezing.  Musculoskeletal:     Right lower leg: Edema present.     Left lower leg: Edema present.  Lymphadenopathy:     Cervical: No cervical adenopathy.  Skin:    General: Skin is warm and dry.     Findings: Erythema present.     Comments: Right heel with eschar tissue, tender to touch. BLE redness with various open areas with 75% pink tissue, 25% white tissue. Improved drainage and odor.   Neurological:     Mental  Status: He is alert and oriented to person, place, and time.     Labs reviewed: Recent Labs    01/25/19 1218 02/08/19 1052 02/22/19 1257  NA 138 137 137  K 4.3 4.1 4.3  CL 98 100 101  CO2 28 25 22   GLUCOSE 103* 94 133*  BUN 21 30* 30*  CREATININE 1.52* 1.55* 1.56*  CALCIUM 9.3 9.0 9.0  MG 2.2 2.0 1.9   Recent Labs    01/25/19 1218 02/08/19 1052 02/22/19 1257  AST 22 22 32  ALT 16 12 23   ALKPHOS 116 114 108  BILITOT 0.7 0.6 0.5  PROT 7.2 6.7 6.7  ALBUMIN 3.7 3.5 2.8*   Recent Labs    01/25/19 1218 02/08/19 1052 02/22/19 1257  WBC 4.2 6.0 7.5  NEUTROABS 2.9 4.1 5.4  HGB 11.9* 11.5* 10.3*  HCT 36.9* 35.2* 31.6*  MCV 96.9 97.0 98.4  PLT 207 218 259   Lab Results  Component Value Date   TSH 5.640 (H) 09/28/2018   No results found for: HGBA1C No results found for: CHOL, HDL, LDLCALC, LDLDIRECT, TRIG, CHOLHDL  Significant Diagnostic Results in last 30 days:  No results found.  Assessment/Plan 1. Cellulitis of lower extremity, unspecified laterality Completed 10 day course of doxycycline with improvement noted in swelling, drainage, wound healing, and erythema. However, he continues to have open areas and chronic redness and swelling to his legs. He refuses to keep them elevated. He is wearing compression dressings with adaptic and abd pads changed by the wound care nurse.   2. Lethargy He is talking less and sleepy. Temps till running low. May be progressing towards the end of life. Priest to visit. Goals of care are comfort based.   3. Right heel pressure injury, not stageable  OT to eval for boot for pressure relief. Keep elevated to avoid further pressure.   Family/ staff Communication: staff  Labs/tests ordered:  NA

## 2019-03-21 DIAGNOSIS — M62561 Muscle wasting and atrophy, not elsewhere classified, right lower leg: Secondary | ICD-10-CM | POA: Diagnosis not present

## 2019-03-21 DIAGNOSIS — R278 Other lack of coordination: Secondary | ICD-10-CM | POA: Diagnosis not present

## 2019-03-21 DIAGNOSIS — M62562 Muscle wasting and atrophy, not elsewhere classified, left lower leg: Secondary | ICD-10-CM | POA: Diagnosis not present

## 2019-03-21 DIAGNOSIS — R2689 Other abnormalities of gait and mobility: Secondary | ICD-10-CM | POA: Diagnosis not present

## 2019-03-22 ENCOUNTER — Ambulatory Visit: Payer: Medicare Other | Admitting: Nurse Practitioner

## 2019-03-22 ENCOUNTER — Ambulatory Visit: Payer: Medicare Other

## 2019-03-22 ENCOUNTER — Other Ambulatory Visit: Payer: Medicare Other

## 2019-03-22 DIAGNOSIS — M62562 Muscle wasting and atrophy, not elsewhere classified, left lower leg: Secondary | ICD-10-CM | POA: Diagnosis not present

## 2019-03-22 DIAGNOSIS — R278 Other lack of coordination: Secondary | ICD-10-CM | POA: Diagnosis not present

## 2019-03-22 DIAGNOSIS — M62561 Muscle wasting and atrophy, not elsewhere classified, right lower leg: Secondary | ICD-10-CM | POA: Diagnosis not present

## 2019-03-22 DIAGNOSIS — R2689 Other abnormalities of gait and mobility: Secondary | ICD-10-CM | POA: Diagnosis not present

## 2019-03-23 ENCOUNTER — Other Ambulatory Visit: Payer: Self-pay | Admitting: Adult Health

## 2019-03-23 DIAGNOSIS — R278 Other lack of coordination: Secondary | ICD-10-CM | POA: Diagnosis not present

## 2019-03-23 DIAGNOSIS — M62561 Muscle wasting and atrophy, not elsewhere classified, right lower leg: Secondary | ICD-10-CM | POA: Diagnosis not present

## 2019-03-23 DIAGNOSIS — M62562 Muscle wasting and atrophy, not elsewhere classified, left lower leg: Secondary | ICD-10-CM | POA: Diagnosis not present

## 2019-03-23 DIAGNOSIS — R2689 Other abnormalities of gait and mobility: Secondary | ICD-10-CM | POA: Diagnosis not present

## 2019-03-23 MED ORDER — MORPHINE SULFATE (CONCENTRATE) 20 MG/ML PO SOLN: 5.0000 mg | ORAL | 0 refills | Status: DC | PRN

## 2019-03-24 ENCOUNTER — Encounter: Payer: Self-pay | Admitting: Adult Health

## 2019-03-24 ENCOUNTER — Non-Acute Institutional Stay (SKILLED_NURSING_FACILITY): Payer: Medicare Other | Admitting: Adult Health

## 2019-03-24 DIAGNOSIS — C20 Malignant neoplasm of rectum: Secondary | ICD-10-CM

## 2019-03-24 DIAGNOSIS — E860 Dehydration: Secondary | ICD-10-CM

## 2019-03-24 DIAGNOSIS — L89619 Pressure ulcer of right heel, unspecified stage: Secondary | ICD-10-CM | POA: Diagnosis not present

## 2019-03-24 NOTE — Progress Notes (Signed)
Location:  Occupational psychologist of Service:  SNF (31) Provider:   Cindi Carbon, ANP [place] [phone]   Gayland Curry, DO  Patient Care Team: Gayland Curry, DO as PCP - General (Geriatric Medicine) Ronald Lobo, MD as Consulting Physician (Gastroenterology) Tania Ade, RN as Registered Nurse Kyung Rudd, MD as Consulting Physician (Radiation Oncology) [name], MD as Consulting Physician (Hematology) Gayland Curry, DO as Consulting Physician (Geriatric Medicine) Royal Hawthorn, NP as Nurse Practitioner (Nurse Practitioner) Royal Hawthorn, NP as Nurse Practitioner (Nurse Practitioner)  Extended Emergency Contact Information Primary Emergency Contact: Anselm Pancoast, [place] [address] Johnnette Litter of [place] Phone: [phone] Relation: Son Secondary Emergency Contact: Lequita Asal, [place] Phone: [phone] Mobile Phone: [phone] Relation: Daughter  Code Status:  DNR Goals of care: Advanced Directive information Advanced Directives 03/09/2019  Does Patient Have a Medical Advance Directive? Yes  Type of Paramedic of [place];Living will;Out of facility DNR (pink MOST or yellow form)  Does patient want to make changes to medical advance directive? No - Guardian declined  Copy of [place] in Chart? No - copy requested  Pre-existing out of facility DNR order (yellow form or pink MOST form) Yellow form placed in chart (order not valid for inpatient use)     Chief Complaint  Patient presents with  . Acute Visit    not able to swallow pills    HPI:  Pt is a 85 y.o. male seen today for an acute visit for inability to swallow. He has a hx of rectal cancer stage IIIb s/p chemo and radiation. He is no longer receiving treatment and has a poor prognosis. He is no longer able to swallow his pills. He is mostly sleeping  and not able to communicate. He is not eating or drinking. [place] due to increased care needs and for help with end of life care.  He is not in pain at this time but does have chronic wounds to both feet and right heel eschar that is uncomfortable at times. His temp has been running  In the 93 range. Neg for COVID.   Past Medical History:  Diagnosis Date  . Anemia due to antineoplastic chemotherapy   . Atherosclerosis of coronary artery cardiologist-  dr Aundra Dubin   aorta and coronary arteries moderate per ct 02-07-2016  . Basal cell carcinoma    40+  . Bladder calculi   . BPH (benign prostatic hyperplasia)   . Calculi, urethra   . Chronic low back pain   . Constipation   . ED (erectile dysfunction)   . Edema of lower extremity   . Family history of melanoma   . Foley catheter in place   . History of adenomatous polyp of colon    2007  . History of basal cell carcinoma excision    multiple  . History of cellulitis    left lower leg-- resolved  . History of melanoma excision    temple right side/ multiple  . History of obstructive sleep apnea    per pt cured by hypnosis  . History of pericarditis    acute episode 09/ 2014  per cardiologist note , dr Aundra Dubin , related possibly to xarelto use  . History of ulcerative colitis    remote  .  HTN (hypertension)   . Hypercholesteremia   . Hypothyroidism   . Incomplete right bundle branch block   . LAFB (left anterior fascicular block)   . Melanoma ([place])   . OA (osteoarthritis)   . PAF (paroxysmal atrial fibrillation) [facility]) cardiologist-  dr dalton Aundra Dubin   dx 09/ 2014  in setting of acute pericarditis   . Rectal cancer [facility]) dx 07-16-2015  oncologist-  dr [name]   Stage IIIB (cT3, N1, M0)-- concurrent  chemotherapy started 08-12-2015/  radiation complete (08-12-2015 to 09-20-2015)  . S/P radiation therapy 08/12/15-09/20/15   rectal ca50.4Gy total dose  . Spinal stenosis    Past Surgical History:   Procedure Laterality Date  . CATARACT EXTRACTION W/ INTRAOCULAR LENS IMPLANT Bilateral 10/ 2016  . CYSTOSCOPY WITH LITHOLAPAXY N/A 02/12/2016   Procedure: CYSTOSCOPY WITH LITHOLAPAXY;  Surgeon: Alexis Frock, MD;  Location: [facility];  Service: Urology;  Laterality: N/A;  . EUS N/A 08/08/2015   Procedure: LOWER ENDOSCOPIC ULTRASOUND (EUS);  Surgeon: Arta Silence, MD;  Location[facility] ENDOSCOPY;  Service: Endoscopy;  Laterality: N/A;  . FLEXIBLE SIGMOIDOSCOPY N/A 08/08/2015   Procedure: FLEXIBLE SIGMOIDOSCOPY;  Surgeon: Arta Silence, MD;  Location: [facility] ENDOSCOPY;  Service: Endoscopy;  Laterality: N/A;  poor prep  . HOLMIUM LASER APPLICATION N/A 04/11/4626   Procedure: HOLMIUM LASER APPLICATION;  Surgeon: Alexis Frock, MD;  Location: [facility];  Service: Urology;  Laterality: N/A;  . INGUINAL HERNIA REPAIR Left 1965  . LEFT KNEE SURGERY  1940  . MOHS SURGERY  x9  last one 2014   . ORIF LEFT ANKLE FX  1935  . ROTATOR CUFF REPAIR Bilateral right 2013/  left 2010  . TRANSTHORACIC ECHOCARDIOGRAM  06-06-2013   ef 55-60%/  mild AR/  mild dilated aortic root/  mild LAE and RAE/  mild to moderate TR/  small pericardial effusion identified    Allergies  Allergen Reactions  . Cinnamon Itching  . Ibuprofen Diarrhea  . Percocet [Oxycodone-Acetaminophen] Other (See Comments)    confusion  . Protonix [Pantoprazole Sodium] Diarrhea and Nausea And Vomiting  . Xarelto [Rivaroxaban] Other (See Comments)    Diarrhea, dehydration    Outpatient Encounter Medications as of 03/24/2019  Medication Sig  . acetaminophen (TYLENOL) 325 MG tablet Take 650 mg by mouth 3 (three) times daily as needed.  Marland Kitchen aspirin EC 325 MG tablet Take 325 mg by mouth daily.   . finasteride (PROSCAR) 5 MG tablet Take 5 mg by mouth every evening. Dose is taken at bedtime  . levothyroxine (SYNTHROID) 112 MCG tablet Take 1 tablet (112 mcg total) by mouth daily before breakfast.  . magic mouthwash  w/lidocaine SOLN Take 5 mLs by mouth 3 (three) times daily. Lidocaine 60mL, Diphenhyramine 68mL, Maalox 35mL  Swish and Spit 89mL 3 times daily as needed  . metoprolol succinate (TOPROL-XL) 25 MG 24 hr tablet TAKE 1/2 TABLET BY MOUTH DAILY.  . mineral oil-hydrophilic petrolatum (AQUAPHOR) ointment Apply 1 application topically at bedtime.  Marland Kitchen morphine (ROXANOL) 20 MG/ML concentrated solution Take 0.25 mLs (5 mg total) by mouth every 4 (four) hours as needed for severe pain.  . Multiple Vitamin (MULTIVITAMIN WITH MINERALS) TABS tablet Take 1 tablet by mouth daily.  . ondansetron (ZOFRAN) 4 MG tablet Take 1-2 tablets (4-8 mg total) by mouth every 8 (eight) hours as needed for nausea or vomiting.  . potassium chloride (K-DUR) 10 MEQ tablet TAKE 1 TABLET BY MOUTH EVERY DAY  . torsemide (DEMADEX) 20 MG tablet  Take 20 mg by mouth daily.  . vitamin B-12 (CYANOCOBALAMIN) 1000 MCG tablet Take 1,000 mcg by mouth daily.   No facility-administered encounter medications on file as of 03/24/2019.     Review of Systems  Unable to perform ROS: Acuity of condition    Immunization History  Administered Date(s) Administered  . Influenza Split 06/07/2015  . Influenza-Unspecified 07/02/2016, 06/28/2017, 07/01/2018  . Pneumococcal Polysaccharide-23 09/07/2010  . Zoster 10/08/2012   Pertinent  Health Maintenance Due  Topic Date Due  . PNA vac Low Risk Adult (2 of 2 - PCV13) 09/08/2011  . INFLUENZA VACCINE  2019/04/25   Fall Risk  04/13/2017 09/30/2016 03/04/2016  Falls in the past year? No No Yes  Number falls in past yr: - - 1  Injury with Fall? - - No   Functional Status Survey:    There were no vitals filed for this visit. There is no height or weight on file to calculate BMI. Physical Exam Constitutional:      General: He is not in acute distress.    Comments: asleep  HENT:     Nose: Nose normal. No congestion.     Mouth/Throat:     Mouth: Mucous membranes are dry.  Eyes:     General:         Right eye: No discharge.        Left eye: No discharge.     Pupils: Pupils are equal, round, and reactive to light.  Cardiovascular:     Rate and Rhythm: Normal rate and regular rhythm.  Pulmonary:     Effort: Pulmonary effort is normal. No respiratory distress.     Breath sounds: Rhonchi present.  Abdominal:     General: Bowel sounds are normal. There is no distension.     Palpations: Abdomen is soft.     Tenderness: There is no abdominal tenderness.  Musculoskeletal:     Right lower leg: Edema present.     Left lower leg: Edema present.  Neurological:     Comments: Looking to the right. Not able to f/c.  Very weak.     Labs reviewed: Recent Labs    01/25/19 1218 02/08/19 1052 02/22/19 1257  NA 138 137 137  K 4.3 4.1 4.3  CL 98 100 101  CO2 28 25 22   GLUCOSE 103* 94 133*  BUN 21 30* 30*  CREATININE 1.52* 1.55* 1.56*  CALCIUM 9.3 9.0 9.0  MG 2.2 2.0 1.9   Recent Labs    01/25/19 1218 02/08/19 1052 02/22/19 1257  AST 22 22 32  ALT 16 12 23   ALKPHOS 116 114 108  BILITOT 0.7 0.6 0.5  PROT 7.2 6.7 6.7  ALBUMIN 3.7 3.5 2.8*   Recent Labs    01/25/19 1218 02/08/19 1052 02/22/19 1257  WBC 4.2 6.0 7.5  NEUTROABS 2.9 4.1 5.4  HGB 11.9* 11.5* 10.3*  HCT 36.9* 35.2* 31.6*  MCV 96.9 97.0 98.4  PLT 207 218 259   Lab Results  Component Value Date   TSH 5.640 (H) 09/28/2018   No results found for: HGBA1C No results found for: CHOL, HDL, LDLCALC, LDLDIRECT, TRIG, CHOLHDL  Significant Diagnostic Results in last 30 days:  No results found.  Assessment/Plan 1. Rectal cancer ([place]) He is no longer receiving treatment for this problem and appears weaker and is no longer able to swallow food. He is at the end of life. His goals of care are comfort based and he has a DNR in place. We will  provide end of life care here at [place].   2. Dehydration Due to poor intake. D/C all oral meds due to difficulty swallowing except morphine and mouthwash  Staff to provide  oral care for dry mouth.   3. Pressure injury of skin of right heel, unspecified injury stage Keep elevated Pain experienced at times, begin morphine 5 mg q 4 hrs prn pain for this reason and any sob that he may experience through the dying process.    Family/ staff Communication: staff communicated with his family   Labs/tests ordered:  NA

## 2019-03-28 ENCOUNTER — Other Ambulatory Visit: Payer: Self-pay | Admitting: Internal Medicine

## 2019-03-28 DIAGNOSIS — R410 Disorientation, unspecified: Secondary | ICD-10-CM

## 2019-03-28 MED ORDER — LORAZEPAM 2 MG/ML PO CONC: ORAL | 0 refills | Status: DC

## 2019-03-28 NOTE — Progress Notes (Signed)
l °

## 2019-04-04 ENCOUNTER — Non-Acute Institutional Stay (SKILLED_NURSING_FACILITY): Payer: Medicare Other | Admitting: Internal Medicine

## 2019-04-04 ENCOUNTER — Telehealth: Payer: Self-pay | Admitting: *Deleted

## 2019-04-04 ENCOUNTER — Encounter: Payer: Self-pay | Admitting: Internal Medicine

## 2019-04-04 DIAGNOSIS — C20 Malignant neoplasm of rectum: Secondary | ICD-10-CM | POA: Diagnosis not present

## 2019-04-04 DIAGNOSIS — R410 Disorientation, unspecified: Secondary | ICD-10-CM

## 2019-04-04 DIAGNOSIS — Z515 Encounter for palliative care: Secondary | ICD-10-CM

## 2019-04-04 DIAGNOSIS — R1312 Dysphagia, oropharyngeal phase: Secondary | ICD-10-CM | POA: Diagnosis not present

## 2019-04-04 DIAGNOSIS — L89619 Pressure ulcer of right heel, unspecified stage: Secondary | ICD-10-CM

## 2019-04-04 DIAGNOSIS — Z7189 Other specified counseling: Secondary | ICD-10-CM | POA: Diagnosis not present

## 2019-04-04 NOTE — Telephone Encounter (Signed)
I will be happy to call him. Please ask his daughter if she wants hospice to be involved in his care, thanks   Nathaniel Merle MD

## 2019-04-04 NOTE — Telephone Encounter (Signed)
Received call from pt's daughter stating that pt has not been doing well, not responsive, couldn't swallow & had been placed in skilled care at [facility].  Dr Burr Medico was to call him but Daughter, Arbie Cookey called & cancelled b/c she didn't think pt was able to communicate well.  She states she was surprised that dad called her last night & was completely lucid.  He still wants Dr Burr Medico to call him maybe 1st of August.  Message routed to [place].

## 2019-04-04 NOTE — Telephone Encounter (Signed)
Received phone call from daughter stating that dad did accept Hospice referral from PCP.  Dr Burr Medico notified.

## 2019-04-04 NOTE — Telephone Encounter (Signed)
Called & spoke with daughter, Arbie Cookey & she states that she is fine with Hospice but thinks that this can be provided at [facility].  She asked that Dr Burr Medico refer to Hospice as [place] b/c in the past dad has declined Hospice.  She thinks that he just has Hospice has a bad connotation & she told dad it was comfort care.  Dr Mariea Clonts also saw pt today & her note is in the chart that this was discussed but don't see that a decision was made.  Arbie Cookey would love for Dr Burr Medico to talk with her dad.  Message sent to Dr Burr Medico.

## 2019-04-04 NOTE — Progress Notes (Signed)
Location:  [facility] Room Number: 123Place of Service:  SNF (31) Provider:  Kent Riendeau L. Mariea Clonts, D.O., C.M.D.  Gayland Curry, DO  Patient Care Team: Gayland Curry, DO as PCP - General (Geriatric Medicine) Ronald Lobo, MD as Consulting Physician (Gastroenterology) Tania Ade, RN as Registered Nurse Kyung Rudd, MD as Consulting Physician (Radiation Oncology) [name], MD as Consulting Physician (Hematology) Gayland Curry, DO as Consulting Physician (Geriatric Medicine) Royal Hawthorn, NP as Nurse Practitioner (Nurse Practitioner) Royal Hawthorn, NP as Nurse Practitioner (Nurse Practitioner)  Extended Emergency Contact Information Primary Emergency Contact: Anselm Pancoast, [place] [address] Johnnette Litter of [place] Phone: [phone] Relation: Son Secondary Emergency Contact: Lequita Asal, [place] of [place] Phone: [phone] Mobile Phone: [phone] Relation: Daughter  Code Status:  DNR, comfort care Goals of care: Advanced Directive information Advanced Directives 03/09/2019  Does Patient Have a Medical Advance Directive? Yes  Type of Paramedic of [place];Living will;Out of facility DNR (pink MOST or yellow form)  Does patient want to make changes to medical advance directive? No - Guardian declined  Copy of [place] in Chart? No - copy requested  Pre-existing out of facility DNR order (yellow form or pink MOST form) Yellow form placed in chart (order not valid for inpatient use)     Chief Complaint  Patient presents with  . Acute Visit    wants to discuss his prognosis and end-of-life care    HPI:  Pt is a 85 y.o. male with h/o rectal ca had admitted to [facility] for some therapy and assistance with ADLs and possibly for end-of-life care seen today for an acute visit to discuss his prognosis and end of life care.  Mr.  Sear has been having considerable delirium since he's been with [place].  He's having very alert, lucid days like when I saw him for admission and then several days where he's been quite sedate.  He actually had an experience the nurse manager described to me as if he'd passed away and it was very hard for him to come back to life--had to pull open heavy doors to make his way back.  NP Wert saw him last week because he was too lethargic to take his medications safely so all of his non-comfort-based meds were stopped here in SNF and when he was alert enough to eat, he was placed on a pureed diet.  He's also asked for Dr. Burr Medico to call him presumably for a similar discussion and she's asked if he wants hospice care which we can certainly arrange here.    When seen, he was definitely delirious.  He was sitting up in his recliner with feet elevated, wrapped.  He had a great deal of difficulty concentrating and finding words and got easily distracted.  He had periods of clarity.  He did express that he wanted to know what his prognosis was, questioned if he'd benefit from anymore cancer treatments and also asked about whether crushed medications in soft food can still help (apparently he felt like it was not helpful when he first got them that way like they'd been when taken whole).  He dislikes the pureed diet and nursing already had the dietitian to see him and upon discussion with his daughter, changed his diet to mechanical soft.  He likes steak and misses eating it (  dentition poor and he was coughing just drinking some coke in my presence and having to repeatedly clear his throat).  He does note that his pain has been controlled.  He's aware of his confusion and it bothers him.  He notes his one leg where he's had ulcerations is still bleeding and he's had a pressure ulcer on his right heel.  Nursing has noted periods of apnea when he's sleeping.  This morning, he was so confused, he wanted to go to [place].  His cracked  fingers and lips have improved.    Spoke with Arbie Cookey.  She'd discussed much of the above with her dad last night.  She is quite comfortable with hospice for him and has had a few other relatives for whom they were quite helpful.   Past Medical History:  Diagnosis Date  . Anemia due to antineoplastic chemotherapy   . Atherosclerosis of coronary artery cardiologist-  dr Aundra Dubin   aorta and coronary arteries moderate per ct 02-07-2016  . Basal cell carcinoma    40+  . Bladder calculi   . BPH (benign prostatic hyperplasia)   . Calculi, urethra   . Chronic low back pain   . Constipation   . ED (erectile dysfunction)   . Edema of lower extremity   . Family history of melanoma   . Foley catheter in place   . History of adenomatous polyp of colon    2007  . History of basal cell carcinoma excision    multiple  . History of cellulitis    left lower leg-- resolved  . History of melanoma excision    temple right side/ multiple  . History of obstructive sleep apnea    per pt cured by hypnosis  . History of pericarditis    acute episode 09/ 2014  per cardiologist note , dr Aundra Dubin , related possibly to xarelto use  . History of ulcerative colitis    remote  . HTN (hypertension)   . Hypercholesteremia   . Hypothyroidism   . Incomplete right bundle branch block   . LAFB (left anterior fascicular block)   . Melanoma ([place])   . OA (osteoarthritis)   . PAF (paroxysmal atrial fibrillation) [facility]) cardiologist-  dr dalton Aundra Dubin   dx 09/ 2014  in setting of acute pericarditis   . Rectal cancer [facility]) dx 07-16-2015  oncologist-  dr [name]   Stage IIIB (cT3, N1, M0)-- concurrent  chemotherapy started 08-12-2015/  radiation complete (08-12-2015 to 09-20-2015)  . S/P radiation therapy 08/12/15-09/20/15   rectal ca50.4Gy total dose  . Spinal stenosis    Past Surgical History:  Procedure Laterality Date  . CATARACT EXTRACTION W/ INTRAOCULAR LENS IMPLANT Bilateral 10/ 2016  . CYSTOSCOPY WITH  LITHOLAPAXY N/A 02/12/2016   Procedure: CYSTOSCOPY WITH LITHOLAPAXY;  Surgeon: Alexis Frock, MD;  Location: [facility];  Service: Urology;  Laterality: N/A;  . EUS N/A 08/08/2015   Procedure: LOWER ENDOSCOPIC ULTRASOUND (EUS);  Surgeon: Arta Silence, MD;  Location: [facility] ENDOSCOPY;  Service: Endoscopy;  Laterality: N/A;  . FLEXIBLE SIGMOIDOSCOPY N/A 08/08/2015   Procedure: FLEXIBLE SIGMOIDOSCOPY;  Surgeon: Arta Silence, MD;  Location[facility] ENDOSCOPY;  Service: Endoscopy;  Laterality: N/A;  poor prep  . HOLMIUM LASER APPLICATION N/A 02/10/4402   Procedure: HOLMIUM LASER APPLICATION;  Surgeon: Alexis Frock, MD;  Location: [facility];  Service: Urology;  Laterality: N/A;  . INGUINAL HERNIA REPAIR Left 1965  . LEFT KNEE SURGERY  1940  . MOHS SURGERY  x9  last one 2014   . ORIF LEFT ANKLE FX  1935  . ROTATOR CUFF REPAIR Bilateral right 2013/  left 2010  . TRANSTHORACIC ECHOCARDIOGRAM  06-06-2013   ef 55-60%/  mild AR/  mild dilated aortic root/  mild LAE and RAE/  mild to moderate TR/  small pericardial effusion identified    Allergies  Allergen Reactions  . Cinnamon Itching  . Ibuprofen Diarrhea  . Percocet [Oxycodone-Acetaminophen] Other (See Comments)    confusion  . Protonix [Pantoprazole Sodium] Diarrhea and Nausea And Vomiting  . Xarelto [Rivaroxaban] Other (See Comments)    Diarrhea, dehydration    Outpatient Encounter Medications as of 04/04/2019  Medication Sig  . carboxymethylcellulose (REFRESH PLUS) 0.5 % SOLN Place 2 drops into both eyes 2 (two) times a day.  Marland Kitchen LORazepam (ATIVAN) 2 MG/ML concentrated solution Take 0.5 mg by mouth every 6 (six) hours as needed for anxiety (terminal delirium).  . magic mouthwash w/lidocaine SOLN Take 5 mLs by mouth 3 (three) times daily. Lidocaine 51mL, Diphenhyramine 17mL, Maalox 45mL  Swish and Spit 25mL 3 times daily as needed  . mineral oil-hydrophilic petrolatum (AQUAPHOR) ointment Apply 1 application topically  at bedtime.  Marland Kitchen morphine 20 MG/5ML solution Take 5 mg by mouth every 4 (four) hours as needed for pain.  . [DISCONTINUED] acetaminophen (TYLENOL) 325 MG tablet Take 650 mg by mouth 3 (three) times daily as needed.  . [DISCONTINUED] aspirin EC 325 MG tablet Take 325 mg by mouth daily.   . [DISCONTINUED] finasteride (PROSCAR) 5 MG tablet Take 5 mg by mouth every evening. Dose is taken at bedtime  . [DISCONTINUED] levothyroxine (SYNTHROID) 112 MCG tablet Take 1 tablet (112 mcg total) by mouth daily before breakfast.  . [DISCONTINUED] LORazepam (LORAZEPAM INTENSOL) 2 MG/ML concentrated solution 0.5mg  po q 6 hrs prn anxiety or terminal delirium (Patient taking differently: Take 0.5 mg by mouth every 6 (six) hours as needed for anxiety (terminal delirium). )  . [DISCONTINUED] metoprolol succinate (TOPROL-XL) 25 MG 24 hr tablet TAKE 1/2 TABLET BY MOUTH DAILY.  . [DISCONTINUED] morphine (ROXANOL) 20 MG/ML concentrated solution Take 0.25 mLs (5 mg total) by mouth every 4 (four) hours as needed for severe pain.  . [DISCONTINUED] Multiple Vitamin (MULTIVITAMIN WITH MINERALS) TABS tablet Take 1 tablet by mouth daily.  . [DISCONTINUED] ondansetron (ZOFRAN) 4 MG tablet Take 1-2 tablets (4-8 mg total) by mouth every 8 (eight) hours as needed for nausea or vomiting.  . [DISCONTINUED] potassium chloride (K-DUR) 10 MEQ tablet TAKE 1 TABLET BY MOUTH EVERY DAY  . [DISCONTINUED] torsemide (DEMADEX) 20 MG tablet Take 20 mg by mouth daily.  . [DISCONTINUED] vitamin B-12 (CYANOCOBALAMIN) 1000 MCG tablet Take 1,000 mcg by mouth daily.   No facility-administered encounter medications on file as of 04/04/2019.     Review of Systems  Constitutional: Positive for malaise/fatigue. Negative for chills and fever.       Hypothermic  HENT:       Wet voice  Eyes: Positive for blurred vision.       Glasses  Respiratory: Positive for cough. Negative for sputum production, shortness of breath and wheezing.   Cardiovascular:  Positive for leg swelling. Negative for chest pain and palpitations.  Gastrointestinal: Negative for abdominal pain, blood in stool and melena.  Genitourinary: Negative for dysuria.  Musculoskeletal: Positive for joint pain. Negative for falls.  Skin:       Ulcerations of legs and right heel pressure injury; unnaboot wraps; fingers have evidence of prior  cracking open, but no actively open areas; lips no longer cracked and dry  Neurological: Positive for weakness. Negative for dizziness.  Endo/Heme/Allergies: Bruises/bleeds easily.  Psychiatric/Behavioral: Positive for memory loss. Negative for depression. The patient is nervous/anxious. The patient does not have insomnia.        Delirium    Immunization History  Administered Date(s) Administered  . Influenza Split 06/07/2015  . Influenza-Unspecified 07/02/2016, 06/28/2017, 07/01/2018  . Pneumococcal Polysaccharide-23 09/07/2010  . Zoster 10/08/2012   Pertinent  Health Maintenance Due  Topic Date Due  . PNA vac Low Risk Adult (2 of 2 - PCV13) 09/08/2011  . INFLUENZA VACCINE  04/16/2019   Fall Risk  04/13/2017 09/30/2016 03/04/2016  Falls in the past year? No No Yes  Number falls in past yr: - - 1  Injury with Fall? - - No   Functional Status Survey:    Vitals:   04/04/19 1229  BP: (!) 96/55  Pulse: 83  Resp: 17  Temp: (!) 93.1 F (33.9 C)  TempSrc: Oral  SpO2: 98%  Weight: 194 lb (88 kg)  Height: 6' (1.829 m)   Body mass index is 26.31 kg/m. Physical Exam Vitals signs reviewed.  Constitutional:      General: He is not in acute distress.    Appearance: He is ill-appearing. He is not toxic-appearing.  HENT:     Head: Normocephalic and atraumatic.     Mouth/Throat:     Comments: Lips smooth, no longer cracked Cardiovascular:     Rate and Rhythm: Rhythm irregular.     Heart sounds: No murmur.  Pulmonary:     Breath sounds: Normal breath sounds.     Comments: More prominent respirations than prior visit Abdominal:       General: Bowel sounds are normal.     Palpations: Abdomen is soft.  Musculoskeletal: Normal range of motion.  Skin:    Comments: unnaboots in place; Ulcerations of legs and right heel pressure injury; unnaboot wraps; fingers have evidence of prior cracking open, but no actively open areas; lips no longer cracked and dry   Neurological:     General: No focal deficit present.     Mental Status: He is alert.     Comments: Oriented to person and place, not time; he struggled to find words and finish thoughts--repeated himself often, difficulty concentrating and easily distracted  Psychiatric:        Mood and Affect: Mood normal.     Labs reviewed: Recent Labs    01/25/19 1218 02/08/19 1052 02/22/19 1257  NA 138 137 137  K 4.3 4.1 4.3  CL 98 100 101  CO2 28 25 22   GLUCOSE 103* 94 133*  BUN 21 30* 30*  CREATININE 1.52* 1.55* 1.56*  CALCIUM 9.3 9.0 9.0  MG 2.2 2.0 1.9   Recent Labs    01/25/19 1218 02/08/19 1052 02/22/19 1257  AST 22 22 32  ALT 16 12 23   ALKPHOS 116 114 108  BILITOT 0.7 0.6 0.5  PROT 7.2 6.7 6.7  ALBUMIN 3.7 3.5 2.8*   Recent Labs    01/25/19 1218 02/08/19 1052 02/22/19 1257  WBC 4.2 6.0 7.5  NEUTROABS 2.9 4.1 5.4  HGB 11.9* 11.5* 10.3*  HCT 36.9* 35.2* 31.6*  MCV 96.9 97.0 98.4  PLT 207 218 259   Lab Results  Component Value Date   TSH 5.640 (H) 09/28/2018   No results found for: HGBA1C No results found for: CHOL, HDL, LDLCALC, LDLDIRECT, TRIG, CHOLHDL  Significant  Diagnostic Results in last 30 days:  No results found.  Assessment/Plan 1. Rectal cancer (HCC) -side effects of chemo began to outweigh benefits for him and his treatments were stopped -initially there were plans for a f/u CT and visit with Dr. Burr Medico, but he has had a considerable decline with more delirious days than lucid ones -he is now agreeable to hospice care and order was placed for [facility] and discussed with Arbie Cookey, his daughter and HCPOA -cont comfort  meds  2. Delirium -due to terminal illness and medication mgt with morphine and ativan for symptom mgt  3. Pressure injury of skin of right heel, unspecified injury stage -continue comfort-based wound care  4. End of life care -educated him on his prognosis which I estimated at days to weeks--days if he is not taking po and weeks if his intake improves -discussed arranging some live visit of sorts for family with him which will be particularly challenging with covid and family in [place], [place], [place] and [place]  5. Advance care planning -he understands his prognosis and agrees with hospice care to assist with pain and symptom mgt as well as to provide some family support with the grieving process -50 minutes spent on ACP  6. Oropharyngeal dysphagia -diet modified from pureed which he did not like to mechanical soft so he feels like he's eating food again  Family/ staff Communication: discussed with snf nurse and also with his daughter, Arbie Cookey; will copy Dr. Burr Medico on my note as I know her office has been in contact with him and his daughter   Jonelle Sidle L. Kataleya Zaugg, D.O. [facility] [address], [place] [address] Cell Phone (Mon-Fri 8am-5pm):  [phone] On Call:  [phone] & follow prompts after 5pm & weekends Office Phone:  [phone] Office Fax:  [phone]

## 2019-04-04 NOTE — Telephone Encounter (Signed)
I called pt and did not reach pt. Will try again later this week. I would support hospice and comfort care.   [name] MD

## 2019-04-07 DIAGNOSIS — D630 Anemia in neoplastic disease: Secondary | ICD-10-CM | POA: Diagnosis not present

## 2019-04-07 DIAGNOSIS — I10 Essential (primary) hypertension: Secondary | ICD-10-CM | POA: Diagnosis not present

## 2019-04-07 DIAGNOSIS — M1990 Unspecified osteoarthritis, unspecified site: Secondary | ICD-10-CM | POA: Diagnosis not present

## 2019-04-07 DIAGNOSIS — L89619 Pressure ulcer of right heel, unspecified stage: Secondary | ICD-10-CM | POA: Diagnosis not present

## 2019-04-07 DIAGNOSIS — R410 Disorientation, unspecified: Secondary | ICD-10-CM | POA: Diagnosis not present

## 2019-04-07 DIAGNOSIS — M4800 Spinal stenosis, site unspecified: Secondary | ICD-10-CM | POA: Diagnosis not present

## 2019-04-07 DIAGNOSIS — E785 Hyperlipidemia, unspecified: Secondary | ICD-10-CM | POA: Diagnosis not present

## 2019-04-07 DIAGNOSIS — C19 Malignant neoplasm of rectosigmoid junction: Secondary | ICD-10-CM | POA: Diagnosis not present

## 2019-04-07 DIAGNOSIS — E039 Hypothyroidism, unspecified: Secondary | ICD-10-CM | POA: Diagnosis not present

## 2019-04-07 DIAGNOSIS — I4891 Unspecified atrial fibrillation: Secondary | ICD-10-CM | POA: Diagnosis not present

## 2019-04-07 DIAGNOSIS — Z8679 Personal history of other diseases of the circulatory system: Secondary | ICD-10-CM | POA: Diagnosis not present

## 2019-04-07 DIAGNOSIS — C7989 Secondary malignant neoplasm of other specified sites: Secondary | ICD-10-CM | POA: Diagnosis not present

## 2019-04-07 DIAGNOSIS — N400 Enlarged prostate without lower urinary tract symptoms: Secondary | ICD-10-CM | POA: Diagnosis not present

## 2019-04-07 DIAGNOSIS — I2510 Atherosclerotic heart disease of native coronary artery without angina pectoris: Secondary | ICD-10-CM | POA: Diagnosis not present

## 2019-04-08 DIAGNOSIS — I2510 Atherosclerotic heart disease of native coronary artery without angina pectoris: Secondary | ICD-10-CM | POA: Diagnosis not present

## 2019-04-08 DIAGNOSIS — M4800 Spinal stenosis, site unspecified: Secondary | ICD-10-CM | POA: Diagnosis not present

## 2019-04-08 DIAGNOSIS — Z8679 Personal history of other diseases of the circulatory system: Secondary | ICD-10-CM | POA: Diagnosis not present

## 2019-04-08 DIAGNOSIS — I10 Essential (primary) hypertension: Secondary | ICD-10-CM | POA: Diagnosis not present

## 2019-04-08 DIAGNOSIS — N400 Enlarged prostate without lower urinary tract symptoms: Secondary | ICD-10-CM | POA: Diagnosis not present

## 2019-04-08 DIAGNOSIS — R410 Disorientation, unspecified: Secondary | ICD-10-CM | POA: Diagnosis not present

## 2019-04-08 DIAGNOSIS — I4891 Unspecified atrial fibrillation: Secondary | ICD-10-CM | POA: Diagnosis not present

## 2019-04-08 DIAGNOSIS — C19 Malignant neoplasm of rectosigmoid junction: Secondary | ICD-10-CM | POA: Diagnosis not present

## 2019-04-08 DIAGNOSIS — E785 Hyperlipidemia, unspecified: Secondary | ICD-10-CM | POA: Diagnosis not present

## 2019-04-08 DIAGNOSIS — C7989 Secondary malignant neoplasm of other specified sites: Secondary | ICD-10-CM | POA: Diagnosis not present

## 2019-04-08 DIAGNOSIS — E039 Hypothyroidism, unspecified: Secondary | ICD-10-CM | POA: Diagnosis not present

## 2019-04-08 DIAGNOSIS — L89619 Pressure ulcer of right heel, unspecified stage: Secondary | ICD-10-CM | POA: Diagnosis not present

## 2019-04-08 DIAGNOSIS — M1990 Unspecified osteoarthritis, unspecified site: Secondary | ICD-10-CM | POA: Diagnosis not present

## 2019-04-08 DIAGNOSIS — D630 Anemia in neoplastic disease: Secondary | ICD-10-CM | POA: Diagnosis not present

## 2019-04-12 ENCOUNTER — Other Ambulatory Visit: Payer: Medicare Other

## 2019-04-12 ENCOUNTER — Ambulatory Visit: Payer: Medicare Other | Admitting: Hematology

## 2019-05-09 DEATH — deceased

## 2019-05-23 ENCOUNTER — Ambulatory Visit: Payer: Medicare Other | Admitting: Nurse Practitioner
